# Patient Record
Sex: Male | Born: 1940 | Race: White | Hispanic: No | Marital: Married | Smoking: Former smoker
Health system: Southern US, Community
[De-identification: ages and names within clinical notes are randomized; demographics above are authoritative.]

## PROBLEM LIST (undated history)

## (undated) DIAGNOSIS — Z952 Presence of prosthetic heart valve: Secondary | ICD-10-CM

## (undated) DIAGNOSIS — E785 Hyperlipidemia, unspecified: Secondary | ICD-10-CM

## (undated) DIAGNOSIS — K219 Gastro-esophageal reflux disease without esophagitis: Secondary | ICD-10-CM

## (undated) DIAGNOSIS — Z8719 Personal history of other diseases of the digestive system: Secondary | ICD-10-CM

## (undated) DIAGNOSIS — I480 Paroxysmal atrial fibrillation: Secondary | ICD-10-CM

## (undated) DIAGNOSIS — J449 Chronic obstructive pulmonary disease, unspecified: Secondary | ICD-10-CM

## (undated) DIAGNOSIS — Z951 Presence of aortocoronary bypass graft: Secondary | ICD-10-CM

## (undated) DIAGNOSIS — D649 Anemia, unspecified: Secondary | ICD-10-CM

## (undated) DIAGNOSIS — D472 Monoclonal gammopathy: Secondary | ICD-10-CM

## (undated) DIAGNOSIS — N529 Male erectile dysfunction, unspecified: Secondary | ICD-10-CM

## (undated) DIAGNOSIS — I2510 Atherosclerotic heart disease of native coronary artery without angina pectoris: Secondary | ICD-10-CM

## (undated) DIAGNOSIS — K56609 Unspecified intestinal obstruction, unspecified as to partial versus complete obstruction: Secondary | ICD-10-CM

## (undated) DIAGNOSIS — K5790 Diverticulosis of intestine, part unspecified, without perforation or abscess without bleeding: Secondary | ICD-10-CM

## (undated) DIAGNOSIS — L409 Psoriasis, unspecified: Secondary | ICD-10-CM

## (undated) DIAGNOSIS — N289 Disorder of kidney and ureter, unspecified: Secondary | ICD-10-CM

## (undated) DIAGNOSIS — I10 Essential (primary) hypertension: Secondary | ICD-10-CM

## (undated) HISTORY — DX: Hyperlipidemia, unspecified: E78.5

## (undated) HISTORY — DX: Essential (primary) hypertension: I10

## (undated) HISTORY — DX: Anemia, unspecified: D64.9

## (undated) HISTORY — DX: Atherosclerotic heart disease of native coronary artery without angina pectoris: I25.10

## (undated) HISTORY — PX: VENTRAL HERNIA REPAIR: SHX424

## (undated) HISTORY — PX: ABDOMINAL AORTIC ANEURYSM REPAIR: SUR1152

## (undated) HISTORY — DX: Psoriasis, unspecified: L40.9

## (undated) HISTORY — DX: Disorder of kidney and ureter, unspecified: N28.9

## (undated) HISTORY — DX: Personal history of other diseases of the digestive system: Z87.19

## (undated) HISTORY — DX: Gastro-esophageal reflux disease without esophagitis: K21.9

## (undated) HISTORY — DX: Monoclonal gammopathy: D47.2

## (undated) HISTORY — DX: Presence of aortocoronary bypass graft: Z95.1

## (undated) HISTORY — DX: Unspecified intestinal obstruction, unspecified as to partial versus complete obstruction: K56.609

## (undated) HISTORY — DX: Male erectile dysfunction, unspecified: N52.9

## (undated) HISTORY — DX: Presence of prosthetic heart valve: Z95.2

## (undated) HISTORY — PX: AORTIC VALVE REPLACEMENT: SHX41

## (undated) HISTORY — DX: Diverticulosis of intestine, part unspecified, without perforation or abscess without bleeding: K57.90

## (undated) HISTORY — DX: Chronic obstructive pulmonary disease, unspecified: J44.9

---
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
## 1992-05-09 HISTORY — PX: PARTIAL COLECTOMY: SHX5273

## 2002-05-09 DIAGNOSIS — I2510 Atherosclerotic heart disease of native coronary artery without angina pectoris: Secondary | ICD-10-CM

## 2002-05-09 DIAGNOSIS — Z951 Presence of aortocoronary bypass graft: Secondary | ICD-10-CM

## 2002-05-09 DIAGNOSIS — Z952 Presence of prosthetic heart valve: Secondary | ICD-10-CM

## 2002-05-09 HISTORY — PX: THORACIC AORTIC ANEURYSM REPAIR: SHX799

## 2002-05-09 HISTORY — DX: Presence of aortocoronary bypass graft: Z95.1

## 2002-05-09 HISTORY — DX: Presence of prosthetic heart valve: Z95.2

## 2002-05-09 HISTORY — DX: Atherosclerotic heart disease of native coronary artery without angina pectoris: I25.10

## 2002-12-19 ENCOUNTER — Encounter: Payer: Self-pay | Admitting: Emergency Medicine

## 2002-12-19 ENCOUNTER — Inpatient Hospital Stay (HOSPITAL_COMMUNITY): Admission: EM | Admit: 2002-12-19 | Discharge: 2003-01-04 | Payer: Self-pay

## 2002-12-19 ENCOUNTER — Encounter: Payer: Self-pay | Admitting: *Deleted

## 2002-12-20 HISTORY — PX: CARDIAC CATHETERIZATION: SHX172

## 2002-12-26 ENCOUNTER — Encounter: Payer: Self-pay | Admitting: Surgery

## 2002-12-26 HISTORY — PX: BENTALL PROCEDURE: SHX5058

## 2002-12-26 HISTORY — PX: ASD REPAIR: SHX258

## 2002-12-26 HISTORY — PX: CORONARY ARTERY BYPASS GRAFT: SHX141

## 2002-12-26 HISTORY — PX: AORTIC VALVE REPLACEMENT: SHX41

## 2002-12-27 ENCOUNTER — Encounter: Payer: Self-pay | Admitting: Surgery

## 2002-12-28 ENCOUNTER — Encounter: Payer: Self-pay | Admitting: Surgery

## 2003-01-01 ENCOUNTER — Encounter: Payer: Self-pay | Admitting: Surgery

## 2003-01-02 ENCOUNTER — Encounter: Payer: Self-pay | Admitting: Surgery

## 2003-02-18 ENCOUNTER — Encounter: Payer: Self-pay | Admitting: Surgery

## 2003-02-18 ENCOUNTER — Encounter: Admission: RE | Admit: 2003-02-18 | Discharge: 2003-02-18 | Payer: Self-pay | Admitting: Surgery

## 2011-10-04 HISTORY — PX: NM MYOVIEW LTD: HXRAD82

## 2012-08-24 ENCOUNTER — Encounter: Payer: Self-pay | Admitting: Pharmacist Clinician (PhC)/ Clinical Pharmacy Specialist

## 2012-08-24 DIAGNOSIS — Z952 Presence of prosthetic heart valve: Secondary | ICD-10-CM | POA: Insufficient documentation

## 2012-08-24 DIAGNOSIS — Z7901 Long term (current) use of anticoagulants: Secondary | ICD-10-CM | POA: Insufficient documentation

## 2012-08-24 DIAGNOSIS — D6869 Other thrombophilia: Secondary | ICD-10-CM | POA: Insufficient documentation

## 2012-10-02 LAB — PROTIME-INR: INR: 4 — AB (ref <=1.1)

## 2012-10-05 ENCOUNTER — Ambulatory Visit (INDEPENDENT_AMBULATORY_CARE_PROVIDER_SITE_OTHER): Payer: Self-pay | Admitting: Pharmacist Clinician (PhC)/ Clinical Pharmacy Specialist

## 2012-10-05 DIAGNOSIS — Z954 Presence of other heart-valve replacement: Secondary | ICD-10-CM

## 2012-10-05 DIAGNOSIS — Z952 Presence of prosthetic heart valve: Secondary | ICD-10-CM

## 2012-10-05 DIAGNOSIS — Z7901 Long term (current) use of anticoagulants: Secondary | ICD-10-CM

## 2012-10-09 ENCOUNTER — Encounter: Payer: Self-pay | Admitting: Cardiovascular Disease

## 2012-10-10 ENCOUNTER — Other Ambulatory Visit: Payer: Self-pay | Admitting: Pharmacist Clinician (PhC)/ Clinical Pharmacy Specialist

## 2012-10-10 MED ORDER — WARFARIN SODIUM 5 MG PO TABS: ORAL_TABLET | ORAL | Status: DC

## 2012-10-29 ENCOUNTER — Ambulatory Visit (INDEPENDENT_AMBULATORY_CARE_PROVIDER_SITE_OTHER): Payer: Self-pay | Admitting: Pharmacist Clinician (PhC)/ Clinical Pharmacy Specialist

## 2012-10-29 DIAGNOSIS — Z7901 Long term (current) use of anticoagulants: Secondary | ICD-10-CM

## 2012-10-29 DIAGNOSIS — Z954 Presence of other heart-valve replacement: Secondary | ICD-10-CM

## 2012-10-29 DIAGNOSIS — Z952 Presence of prosthetic heart valve: Secondary | ICD-10-CM

## 2012-10-29 LAB — POCT INR: INR: 5.2

## 2012-11-12 ENCOUNTER — Ambulatory Visit (INDEPENDENT_AMBULATORY_CARE_PROVIDER_SITE_OTHER): Payer: Self-pay | Admitting: Pharmacist Clinician (PhC)/ Clinical Pharmacy Specialist

## 2012-11-12 DIAGNOSIS — Z954 Presence of other heart-valve replacement: Secondary | ICD-10-CM

## 2012-11-12 DIAGNOSIS — Z952 Presence of prosthetic heart valve: Secondary | ICD-10-CM

## 2012-11-12 DIAGNOSIS — Z7901 Long term (current) use of anticoagulants: Secondary | ICD-10-CM

## 2012-11-29 ENCOUNTER — Ambulatory Visit (INDEPENDENT_AMBULATORY_CARE_PROVIDER_SITE_OTHER): Payer: Self-pay | Admitting: Pharmacist Clinician (PhC)/ Clinical Pharmacy Specialist

## 2012-11-29 DIAGNOSIS — Z954 Presence of other heart-valve replacement: Secondary | ICD-10-CM

## 2012-11-29 DIAGNOSIS — Z952 Presence of prosthetic heart valve: Secondary | ICD-10-CM

## 2012-11-29 DIAGNOSIS — Z7901 Long term (current) use of anticoagulants: Secondary | ICD-10-CM

## 2012-12-19 ENCOUNTER — Ambulatory Visit (INDEPENDENT_AMBULATORY_CARE_PROVIDER_SITE_OTHER): Payer: Self-pay | Admitting: Pharmacist Clinician (PhC)/ Clinical Pharmacy Specialist

## 2012-12-19 DIAGNOSIS — Z7901 Long term (current) use of anticoagulants: Secondary | ICD-10-CM

## 2012-12-19 DIAGNOSIS — Z954 Presence of other heart-valve replacement: Secondary | ICD-10-CM

## 2012-12-19 DIAGNOSIS — Z952 Presence of prosthetic heart valve: Secondary | ICD-10-CM

## 2013-01-16 ENCOUNTER — Telehealth: Payer: Self-pay | Admitting: Cardiovascular Disease

## 2013-01-16 NOTE — Telephone Encounter (Signed)
Pt ot have colonoscopy on 10/7, will need lovenox bridge. Sees [facility] on 9/24, will give info to pt at that time.

## 2013-01-16 NOTE — Telephone Encounter (Signed)
Pt is calling regarding his colonoscopy and they want him to go on Lovenox to bridge Warfarin. Needs to know what to do.

## 2013-01-16 NOTE — Telephone Encounter (Signed)
Message forwarded to Belenda Cruise, PharmD.  Medical Records please take paper chart to [facility].  Thanks.

## 2013-01-22 ENCOUNTER — Telehealth (HOSPITAL_COMMUNITY): Payer: Self-pay | Admitting: *Deleted

## 2013-01-22 ENCOUNTER — Other Ambulatory Visit (HOSPITAL_COMMUNITY): Payer: Self-pay | Admitting: Cardiovascular Disease

## 2013-01-22 ENCOUNTER — Ambulatory Visit (INDEPENDENT_AMBULATORY_CARE_PROVIDER_SITE_OTHER): Payer: MEDICARE | Admitting: Pharmacist Clinician (PhC)/ Clinical Pharmacy Specialist

## 2013-01-22 DIAGNOSIS — Z954 Presence of other heart-valve replacement: Secondary | ICD-10-CM

## 2013-01-22 DIAGNOSIS — Z952 Presence of prosthetic heart valve: Secondary | ICD-10-CM

## 2013-01-22 DIAGNOSIS — Z7901 Long term (current) use of anticoagulants: Secondary | ICD-10-CM

## 2013-01-24 ENCOUNTER — Ambulatory Visit (HOSPITAL_COMMUNITY): Payer: MEDICARE

## 2013-01-24 ENCOUNTER — Telehealth: Payer: Self-pay | Admitting: Pharmacist Clinician (PhC)/ Clinical Pharmacy Specialist

## 2013-01-24 NOTE — Telephone Encounter (Signed)
Letter for lovenox bridging for colonoscopy - will give to pt on 9/24 with [facility] appt.

## 2013-01-30 ENCOUNTER — Other Ambulatory Visit: Payer: Self-pay | Admitting: Pharmacist Clinician (PhC)/ Clinical Pharmacy Specialist

## 2013-01-30 MED ORDER — ENOXAPARIN SODIUM 100 MG/ML ~~LOC~~ SOLN: 100.0000 mg | Freq: Two times a day (BID) | SUBCUTANEOUS | Status: DC

## 2013-01-31 ENCOUNTER — Encounter: Payer: Self-pay | Admitting: Cardiovascular Disease

## 2013-02-20 LAB — PROTIME-INR: INR: 2.7 — AB (ref <=1.1)

## 2013-02-21 ENCOUNTER — Ambulatory Visit (INDEPENDENT_AMBULATORY_CARE_PROVIDER_SITE_OTHER): Payer: MEDICARE | Admitting: Pharmacist Clinician (PhC)/ Clinical Pharmacy Specialist

## 2013-02-21 DIAGNOSIS — Z954 Presence of other heart-valve replacement: Secondary | ICD-10-CM

## 2013-02-21 DIAGNOSIS — Z7901 Long term (current) use of anticoagulants: Secondary | ICD-10-CM

## 2013-02-21 DIAGNOSIS — Z952 Presence of prosthetic heart valve: Secondary | ICD-10-CM

## 2013-03-07 ENCOUNTER — Ambulatory Visit (HOSPITAL_COMMUNITY)
Admission: RE | Admit: 2013-03-07 | Discharge: 2013-03-07 | Disposition: A | Discharge status: Home / Self Care | Payer: Medicare Other | Source: Ambulatory Visit | Attending: Cardiovascular Disease | Admitting: Cardiovascular Disease

## 2013-03-07 DIAGNOSIS — Z952 Presence of prosthetic heart valve: Secondary | ICD-10-CM

## 2013-03-07 DIAGNOSIS — Z954 Presence of other heart-valve replacement: Secondary | ICD-10-CM | POA: Insufficient documentation

## 2013-03-07 DIAGNOSIS — I079 Rheumatic tricuspid valve disease, unspecified: Secondary | ICD-10-CM | POA: Insufficient documentation

## 2013-03-07 DIAGNOSIS — I379 Nonrheumatic pulmonary valve disorder, unspecified: Secondary | ICD-10-CM | POA: Insufficient documentation

## 2013-03-07 DIAGNOSIS — I080 Rheumatic disorders of both mitral and aortic valves: Secondary | ICD-10-CM | POA: Insufficient documentation

## 2013-03-07 NOTE — Progress Notes (Signed)
2D Echo Performed 03/07/2013    Estefano Victory, RCS  

## 2013-03-14 ENCOUNTER — Telehealth: Payer: Self-pay | Admitting: *Deleted

## 2013-03-14 NOTE — Telephone Encounter (Signed)
Echo EF normal.  Prosthetic Valve looks good.  Marykay Lex, MD

## 2013-03-14 NOTE — Telephone Encounter (Signed)
Call to pt and results given per MD.  Pt verbalized understanding.  

## 2013-03-14 NOTE — Telephone Encounter (Signed)
Left message to call back  

## 2013-03-14 NOTE — Telephone Encounter (Signed)
Pt is calling in regards to his echo results

## 2013-03-14 NOTE — Telephone Encounter (Signed)
Former Erik Meyer pt w/ recall to see Dr. Herbie Baltimore.  Returned call and pt verified x 2.  Pt informed results will be read by new cardiologist and he will be notified once done.  Pt verbalized understanding and agreed w/ plan.   Medical Records notified to pull chart and place on Dr. Elissa Hefty cart for review when in office.  Result in Epic.  Message forwarded to Dr. Herbie Baltimore.

## 2013-03-15 ENCOUNTER — Other Ambulatory Visit: Payer: Self-pay

## 2013-03-15 MED ORDER — SIMVASTATIN 20 MG PO TABS: 20.0000 mg | ORAL_TABLET | Freq: Every day | ORAL | Status: DC

## 2013-03-15 MED ORDER — AMLODIPINE BESYLATE 10 MG PO TABS: 10.0000 mg | ORAL_TABLET | Freq: Every day | ORAL | Status: DC

## 2013-03-15 MED ORDER — METOPROLOL SUCCINATE ER 50 MG PO TB24: 50.0000 mg | ORAL_TABLET | Freq: Every day | ORAL | Status: DC

## 2013-03-15 NOTE — Telephone Encounter (Signed)
Rx was sent to pharmacy electronically. 

## 2013-03-18 ENCOUNTER — Other Ambulatory Visit: Payer: Self-pay | Admitting: Pharmacist Clinician (PhC)/ Clinical Pharmacy Specialist

## 2013-03-18 MED ORDER — WARFARIN SODIUM 5 MG PO TABS: ORAL_TABLET | ORAL | Status: DC

## 2013-03-20 ENCOUNTER — Ambulatory Visit (INDEPENDENT_AMBULATORY_CARE_PROVIDER_SITE_OTHER): Payer: MEDICARE | Admitting: Pharmacist Clinician (PhC)/ Clinical Pharmacy Specialist

## 2013-03-20 DIAGNOSIS — Z954 Presence of other heart-valve replacement: Secondary | ICD-10-CM

## 2013-03-20 DIAGNOSIS — Z7901 Long term (current) use of anticoagulants: Secondary | ICD-10-CM

## 2013-03-20 DIAGNOSIS — Z952 Presence of prosthetic heart valve: Secondary | ICD-10-CM

## 2013-04-18 ENCOUNTER — Ambulatory Visit (INDEPENDENT_AMBULATORY_CARE_PROVIDER_SITE_OTHER): Payer: MEDICARE | Admitting: Pharmacist Clinician (PhC)/ Clinical Pharmacy Specialist

## 2013-04-18 DIAGNOSIS — Z7901 Long term (current) use of anticoagulants: Secondary | ICD-10-CM

## 2013-04-18 DIAGNOSIS — Z952 Presence of prosthetic heart valve: Secondary | ICD-10-CM

## 2013-04-18 DIAGNOSIS — Z954 Presence of other heart-valve replacement: Secondary | ICD-10-CM

## 2013-05-15 LAB — PROTIME-INR: INR: 4.7 — AB (ref <=1.1)

## 2013-05-16 ENCOUNTER — Ambulatory Visit (INDEPENDENT_AMBULATORY_CARE_PROVIDER_SITE_OTHER): Payer: MEDICARE | Admitting: Pharmacist Clinician (PhC)/ Clinical Pharmacy Specialist

## 2013-05-16 DIAGNOSIS — Z7901 Long term (current) use of anticoagulants: Secondary | ICD-10-CM

## 2013-05-16 DIAGNOSIS — Z954 Presence of other heart-valve replacement: Secondary | ICD-10-CM

## 2013-05-16 DIAGNOSIS — Z952 Presence of prosthetic heart valve: Secondary | ICD-10-CM

## 2013-05-28 ENCOUNTER — Telehealth: Payer: Self-pay | Admitting: *Deleted

## 2013-05-28 MED ORDER — VALSARTAN-HYDROCHLOROTHIAZIDE 320-12.5 MG PO TABS: 1.0000 | ORAL_TABLET | Freq: Every day | ORAL | Status: DC

## 2013-05-28 MED ORDER — AMLODIPINE BESYLATE 10 MG PO TABS: 10.0000 mg | ORAL_TABLET | Freq: Every day | ORAL | Status: DC

## 2013-05-28 MED ORDER — SIMVASTATIN 20 MG PO TABS: 20.0000 mg | ORAL_TABLET | Freq: Every day | ORAL | Status: DC

## 2013-05-28 MED ORDER — METOPROLOL SUCCINATE ER 50 MG PO TB24: 50.0000 mg | ORAL_TABLET | Freq: Every day | ORAL | Status: DC

## 2013-05-28 NOTE — Telephone Encounter (Signed)
Please find out which Rx pt needs so it can be sent to [facility].  Thanks. ~ [employer]. Nicki Reaper, BSN, RN

## 2013-05-28 NOTE — Telephone Encounter (Signed)
Pt stated that he needed all of them called in except Lovenox with the addition of Valsartan 320/12.5 mg 1 a day.

## 2013-05-28 NOTE — Telephone Encounter (Signed)
Paper chart received and Rxs verified.  Refill(s) sent to pharmacy: [facility] for 90 day supplies w/o refills.  Keep appt for refills.

## 2013-05-28 NOTE — Telephone Encounter (Signed)
Pt called stating that he has new insurance and needs his Rx to be refilled at [facility]  [facility]

## 2013-05-29 ENCOUNTER — Telehealth: Payer: Self-pay | Admitting: *Deleted

## 2013-05-29 LAB — PROTIME-INR: INR: 4.1 — AB (ref <=1.1)

## 2013-05-29 NOTE — Telephone Encounter (Signed)
Pt called stating that all the refills were called into [facility] except for his Coumadin, and wanted to know if it can be called in.  [place]

## 2013-05-30 ENCOUNTER — Ambulatory Visit (INDEPENDENT_AMBULATORY_CARE_PROVIDER_SITE_OTHER): Payer: MEDICARE | Admitting: Pharmacist Clinician (PhC)/ Clinical Pharmacy Specialist

## 2013-05-30 DIAGNOSIS — Z954 Presence of other heart-valve replacement: Secondary | ICD-10-CM

## 2013-05-30 DIAGNOSIS — Z7901 Long term (current) use of anticoagulants: Secondary | ICD-10-CM

## 2013-05-30 DIAGNOSIS — Z952 Presence of prosthetic heart valve: Secondary | ICD-10-CM

## 2013-05-30 MED ORDER — WARFARIN SODIUM 5 MG PO TABS: ORAL_TABLET | ORAL | Status: DC

## 2013-06-17 LAB — PROTIME-INR: INR: 2.6 — AB (ref <=1.1)

## 2013-06-18 ENCOUNTER — Ambulatory Visit (INDEPENDENT_AMBULATORY_CARE_PROVIDER_SITE_OTHER): Payer: MEDICARE | Admitting: Pharmacist Clinician (PhC)/ Clinical Pharmacy Specialist

## 2013-06-18 DIAGNOSIS — Z7901 Long term (current) use of anticoagulants: Secondary | ICD-10-CM

## 2013-06-18 DIAGNOSIS — Z954 Presence of other heart-valve replacement: Secondary | ICD-10-CM

## 2013-06-18 DIAGNOSIS — Z952 Presence of prosthetic heart valve: Secondary | ICD-10-CM

## 2013-07-07 DIAGNOSIS — K56609 Unspecified intestinal obstruction, unspecified as to partial versus complete obstruction: Secondary | ICD-10-CM

## 2013-07-07 HISTORY — DX: Unspecified intestinal obstruction, unspecified as to partial versus complete obstruction: K56.609

## 2013-07-07 HISTORY — PX: LAPAROSCOPIC LYSIS OF ADHESIONS: SHX5905

## 2013-07-12 LAB — PROTIME-INR: INR: 4.8 — AB (ref 0.9–1.1)

## 2013-07-15 ENCOUNTER — Ambulatory Visit (INDEPENDENT_AMBULATORY_CARE_PROVIDER_SITE_OTHER): Payer: MEDICARE | Admitting: Pharmacist Clinician (PhC)/ Clinical Pharmacy Specialist

## 2013-07-15 DIAGNOSIS — Z954 Presence of other heart-valve replacement: Secondary | ICD-10-CM

## 2013-07-15 DIAGNOSIS — Z952 Presence of prosthetic heart valve: Secondary | ICD-10-CM

## 2013-07-15 DIAGNOSIS — Z7901 Long term (current) use of anticoagulants: Secondary | ICD-10-CM

## 2013-07-24 LAB — PROTIME-INR
INR: 2.4 — AB (ref 0.9–1.1)
INR: 2.4 — AB (ref <=1.1)

## 2013-07-29 ENCOUNTER — Telehealth: Payer: Self-pay | Admitting: Pharmacist Clinician (PhC)/ Clinical Pharmacy Specialist

## 2013-07-29 ENCOUNTER — Ambulatory Visit (INDEPENDENT_AMBULATORY_CARE_PROVIDER_SITE_OTHER): Payer: MEDICARE | Admitting: Pharmacist Clinician (PhC)/ Clinical Pharmacy Specialist

## 2013-07-29 DIAGNOSIS — Z952 Presence of prosthetic heart valve: Secondary | ICD-10-CM

## 2013-07-29 DIAGNOSIS — Z954 Presence of other heart-valve replacement: Secondary | ICD-10-CM

## 2013-07-29 DIAGNOSIS — Z7901 Long term (current) use of anticoagulants: Secondary | ICD-10-CM

## 2013-07-29 NOTE — Telephone Encounter (Signed)
Pt called from [facility] ER, states has bowel obstruction, needs surgery, will call later.

## 2013-08-01 ENCOUNTER — Ambulatory Visit: Payer: MEDICARE | Admitting: Cardiology

## 2013-08-06 ENCOUNTER — Telehealth: Payer: Self-pay | Admitting: Pharmacist Clinician (PhC)/ Clinical Pharmacy Specialist

## 2013-08-06 NOTE — Telephone Encounter (Signed)
Returned call to patient, has been in [facility] x 8-9 days, waiting for INR to become therapeutic for discharge.  Advised that he return to previously stable dose of 10mg  daily except 7.5mg  MF and check INR 1 week after discharge.  Pt voiced understanding

## 2013-08-06 NOTE — Telephone Encounter (Signed)
Pt in hospital in [place], had problems with bowel obstruction, now waiting for INR to become therapeutic for discharge.  Wants to know what dose schedule should be on d/c.

## 2013-08-13 LAB — POCT INR: INR: 2.2

## 2013-08-14 ENCOUNTER — Other Ambulatory Visit: Payer: Self-pay

## 2013-08-14 ENCOUNTER — Ambulatory Visit (INDEPENDENT_AMBULATORY_CARE_PROVIDER_SITE_OTHER): Payer: MEDICARE | Admitting: Pharmacist Clinician (PhC)/ Clinical Pharmacy Specialist

## 2013-08-14 DIAGNOSIS — Z7901 Long term (current) use of anticoagulants: Secondary | ICD-10-CM

## 2013-08-14 DIAGNOSIS — Z952 Presence of prosthetic heart valve: Secondary | ICD-10-CM

## 2013-08-14 DIAGNOSIS — Z954 Presence of other heart-valve replacement: Secondary | ICD-10-CM

## 2013-08-14 MED ORDER — SIMVASTATIN 20 MG PO TABS: 20.0000 mg | ORAL_TABLET | Freq: Every day | ORAL | Status: DC

## 2013-08-14 MED ORDER — VALSARTAN-HYDROCHLOROTHIAZIDE 320-12.5 MG PO TABS: 1.0000 | ORAL_TABLET | Freq: Every day | ORAL | Status: DC

## 2013-08-14 MED ORDER — METOPROLOL SUCCINATE ER 50 MG PO TB24: 50.0000 mg | ORAL_TABLET | Freq: Every day | ORAL | Status: DC

## 2013-08-14 MED ORDER — AMLODIPINE BESYLATE 10 MG PO TABS: 10.0000 mg | ORAL_TABLET | Freq: Every day | ORAL | Status: DC

## 2013-08-14 NOTE — Telephone Encounter (Signed)
Rx was sent to pharmacy electronically. 

## 2013-08-20 DIAGNOSIS — N289 Disorder of kidney and ureter, unspecified: Secondary | ICD-10-CM

## 2013-08-20 HISTORY — DX: Disorder of kidney and ureter, unspecified: N28.9

## 2013-08-23 LAB — PROTIME-INR: INR: 1.4 — AB (ref 0.9–1.1)

## 2013-08-27 ENCOUNTER — Ambulatory Visit (INDEPENDENT_AMBULATORY_CARE_PROVIDER_SITE_OTHER): Payer: MEDICARE | Admitting: Pharmacist Clinician (PhC)/ Clinical Pharmacy Specialist

## 2013-08-27 DIAGNOSIS — Z954 Presence of other heart-valve replacement: Secondary | ICD-10-CM

## 2013-08-27 DIAGNOSIS — Z952 Presence of prosthetic heart valve: Secondary | ICD-10-CM

## 2013-08-27 DIAGNOSIS — Z7901 Long term (current) use of anticoagulants: Secondary | ICD-10-CM

## 2013-09-04 LAB — PROTIME-INR: INR: 3 — AB (ref 0.9–1.1)

## 2013-09-06 ENCOUNTER — Ambulatory Visit (INDEPENDENT_AMBULATORY_CARE_PROVIDER_SITE_OTHER): Payer: MEDICARE | Admitting: Pharmacist Clinician (PhC)/ Clinical Pharmacy Specialist

## 2013-09-06 DIAGNOSIS — Z952 Presence of prosthetic heart valve: Secondary | ICD-10-CM

## 2013-09-06 DIAGNOSIS — Z954 Presence of other heart-valve replacement: Secondary | ICD-10-CM

## 2013-09-06 DIAGNOSIS — Z7901 Long term (current) use of anticoagulants: Secondary | ICD-10-CM

## 2013-09-11 ENCOUNTER — Encounter: Payer: Self-pay | Admitting: *Deleted

## 2013-09-13 ENCOUNTER — Encounter: Payer: Self-pay | Admitting: Cardiology

## 2013-09-13 ENCOUNTER — Ambulatory Visit (INDEPENDENT_AMBULATORY_CARE_PROVIDER_SITE_OTHER): Payer: Medicare HMO | Admitting: Cardiology

## 2013-09-13 VITALS — BP 148/72 | HR 57 | Ht 71.0 in | Wt 213.6 lb

## 2013-09-13 DIAGNOSIS — Z952 Presence of prosthetic heart valve: Secondary | ICD-10-CM

## 2013-09-13 DIAGNOSIS — E785 Hyperlipidemia, unspecified: Secondary | ICD-10-CM

## 2013-09-13 DIAGNOSIS — Z7901 Long term (current) use of anticoagulants: Secondary | ICD-10-CM

## 2013-09-13 DIAGNOSIS — I2510 Atherosclerotic heart disease of native coronary artery without angina pectoris: Secondary | ICD-10-CM

## 2013-09-13 DIAGNOSIS — I10 Essential (primary) hypertension: Secondary | ICD-10-CM

## 2013-09-13 DIAGNOSIS — Z954 Presence of other heart-valve replacement: Secondary | ICD-10-CM

## 2013-09-13 NOTE — Patient Instructions (Signed)
No changes continue with current medication   Your physician wants you to follow-up in 6 month Dr Ellyn Hack.  You will receive a reminder letter in the mail two months in advance. If you don't receive a letter, please call our office to schedule the follow-up appointment.

## 2013-09-15 ENCOUNTER — Encounter: Payer: Self-pay | Admitting: Cardiology

## 2013-09-15 DIAGNOSIS — I25709 Atherosclerosis of coronary artery bypass graft(s), unspecified, with unspecified angina pectoris: Secondary | ICD-10-CM | POA: Insufficient documentation

## 2013-09-15 DIAGNOSIS — E785 Hyperlipidemia, unspecified: Secondary | ICD-10-CM | POA: Insufficient documentation

## 2013-09-15 DIAGNOSIS — Z952 Presence of prosthetic heart valve: Secondary | ICD-10-CM | POA: Insufficient documentation

## 2013-09-15 DIAGNOSIS — I10 Essential (primary) hypertension: Secondary | ICD-10-CM | POA: Insufficient documentation

## 2013-09-15 NOTE — Assessment & Plan Note (Signed)
The discomfort is having is not exertional. It is intermittent and really not associated with anything in particular. It does not sound like an anginal symptom. His last Myoview was in 2013, if his symptoms continue to be bothersome, would consider rechecking a Cardiolite/Myoview to assess for ischemia. He is on aspirin, beta blocker, ARB as well as calcium channel blocker.

## 2013-09-15 NOTE — Assessment & Plan Note (Addendum)
He seems to be relatively stable overall. Most recent echocardiogram showed stable low functioning valve. Would anticipate probably rechecking echocardiogram not this year but next year unless symptoms warrant. Continues to be on warfarin.

## 2013-09-15 NOTE — Assessment & Plan Note (Signed)
On statin. Monitored by PCP. Usually labs were checked with his annual exam.

## 2013-09-15 NOTE — Assessment & Plan Note (Signed)
Borderline control on significant medications. Do not have room to increase metoprolol, or Diovan for that matter. The only difference could be increasing the HCTZ component of the Diovan-HCT.Marland Kitchen interest not add a new medication. We'll just continue to monitor. His blood pressures usually better than this at home.

## 2013-09-15 NOTE — Progress Notes (Signed)
PATIENT: Erik Meyer MRN: [id number] DOB: February 13, 1941 PCP: Olin Hauser, MD  Clinic Note: Chief Complaint  Patient presents with  . 6 month visit    chest pain - dull , hosp at the end of [facility]ARCH- SMALL BOWEL OBSTRUCTION -SURGERY, NO SOB, NO EDEMA    HPI: Erik Meyer is a 73 y.o. male with a PMH below who presents today for establishment of cardiology care to return to Dr. Rollene Fare. 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 Cyndia Bent in 2004.  Preop cath showed multivessel disease so he also had four-vessel CABG. Since his surgery, he is not having further catheterizations. Myoview 09/2011 low risk of no ischemia. Most recent echo was in October 2014 which showed normal functioning prosthetic valve. He has had a constant dull pain in his chest since before his surgery.  Since his last visit with Dr. Rollene Fare and 2014, he was admitted to the hospital in [place] for small bowel obstruction. Unlike his prior small bowel shows an admission in December 2013, he had to undergo abdominal surgery with lysis of a single adhesion that resolved his bowel obstruction. An abdominal exam revealed a right kidney lesion that is concerning for either R. cc versus hemorrhagic cyst. He was referred to Dr. Eden Lathe for urology. Thought was this was not likely to be cancer. Followup CT scan in July.   Interval History: Today's using Nu-Knit well and, in his usual state of health. He has no major complaints. He continues to have this intermittent substernal chest discomfort that is all pressure like. Is not exertional in the kidney off and on for a while and go away for a while. He does not have routine exertional chest discomfort or dyspnea.  Cardiovascular ROS: No PND, orthopnea or edema.  No palpitations, lightheadedness, dizziness, weakness or syncope/near syncope. No TIA/amaurosis fugax  symptoms. No melena, hematochezia, epistaxis or hematuria.  No claudication.   Past Medical History  Diagnosis Date  . Hx of irritable bowel syndrome   . CAD, multiple vessel 2004    Found during preop cath for [facility] AVR procedure;   Marland Kitchen S/P CABG x 4 2004    LIMA-LAD, SVG-D1, SVG- PDA-PLA  . S/P AVR (aortic valve replacement) and aortoplasty 2004    Dr. Cyndia BentDeneen Harts procedure - St. Jude aVR (25 mm prosthesis) with aortic root conduit  . Monoclonal gammopathy of undetermined significance   . History of SBO (small bowel obstruction) March 2015    Abdominal surgery with LOA  . Lesion of right native kidney  08/20/2013    Abdominal US : 2.4 cm x 1.7 complex lesion in the upper pole the right kidney in March of 2012 concerning for RCC although enlarging hemorrhagic cyst could have this appearance as well.,   . Dyslipidemia, goal LDL below 70     Monitored by PCP. On statin  . Hypertension, essential   . Anemia     Prior Cardiac Evaluation and Past Surgical History: Past Surgical History  Procedure Laterality Date  . Abdominal aortic aneurysm repair    . Asd repair  12/26/2002    Aortic valve replacement and replacement of aortic root aneurysm using a 25 mm St. JUDE mechanical valve cinduit with reimplantation of the coronary arteeries  . Coronary artery bypass graft  12/26/2002    LIMA to LAD,SVG to diagonal, SVG to PDA and PLA.   Marland Kitchen Ventral hernia  repair      repair with mesh by Dr Collene Mares at [facility]  . Nm myoview ltd  10/04/2011    showed no ischemia;EF52%  . Cardiac catheterization  Aug  13,2004    diffuse  coronary disease,prior to his BENTALL PROCEDURE  . Transthoracic echocardiogram  October 2013    CW doppler gradient of 2.67m/ second across the aortic valve with a mean  gradient of 12 ,which was normal for valve, normal RSVP ANDSYSTOLIC FUNCTION WIYH DIASTOLIC RELAXATION ABNORMALITY  . Transthoracic echocardiogram  October 2014    EF 55-60%. Mild concentric LVH;  Grade 1  Diastolic Dysfunction.; St. Jude familiar mechanical prosthesis well seated. No evidence of stenosis. Gradients normal for  prosthesis; moderate to severely dilated left atrium.   . Partial colectomy  1994  . Laparoscopic lysis of adhesions  March 2015    performed in [place] for SBO     Allergies  Allergen Reactions  . Doxycycline Rash    Current Outpatient Prescriptions  Medication Sig Dispense Refill  . amLODipine (NORVASC) 10 MG tablet Take 1 tablet (10 mg total) by mouth daily. Keep appointment for refills.  90 tablet  1  . ampicillin (PRINCIPEN) 500 MG capsule Take 500 mg by mouth daily.       Marland Kitchen aspirin 81 MG tablet Take 81 mg by mouth daily.      Marland Kitchen esomeprazole (NEXIUM) 20 MG capsule Take 20 mg by mouth daily at 12 noon.      . gabapentin (NEURONTIN) 300 MG capsule TAKE 2 TABLET AT BEDTIME      . IRON PO Take 325 mg by mouth daily.       . metoprolol succinate (TOPROL-XL) 50 MG 24 hr tablet Take 1 tablet (50 mg total) by mouth daily. Take with or immediately following a meal. Keep appointment for refills.  90 tablet  1  . simvastatin (ZOCOR) 20 MG tablet Take 1 tablet (20 mg total) by mouth at bedtime. Keep appointment for refills.  90 tablet  1  . tamsulosin (FLOMAX) 0.4 MG CAPS capsule Take 0.4 mg by mouth daily.      . valsartan-hydrochlorothiazide (DIOVAN-HCT) 320-12.5 MG per tablet Take 1 tablet by mouth daily. Keep appointment for refills.  90 tablet  1  . warfarin (COUMADIN) 5 MG tablet Take 2 tablets by mouth daily or as directed by coumadin clinic  180 tablet  1   No current facility-administered medications for this visit.    History   Social History Narrative   Married gentleman with no children. Quit smoking in 1998.   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   PCP: Dr. Claiborne Billings in [place]; GI: Dr. Janus Molder in [place]    Family History: Cancer in his mother; Lung cancer in his father.  ROS: A comprehensive Review of Systems - Negative except Result ligament Erik Meyer discomfort from his recent procedure. He has chronic mild right-sided lower extremity edema that is intermittent. Was negative with exertion of mild arthritis pains.  PHYSICAL EXAM BP 148/72  Pulse 57  Ht 5\' 11"  (1.803 m)  Wt 213 lb 9.6 oz (96.888 kg)  BMI 29.80 kg/m2 General appearance: alert, cooperative, appears stated age, no distress and Healthy-appearing, well-nourished well-groomed. He answers questions appropriately. Neck: no carotid bruit, no JVD, supple, symmetrical, trachea midline and radiating aortic murmur Lungs: clear to auscultation  bilaterally, normal percussion bilaterally and Nonlabored, good movement Heart: RRR with normal S1 with mild mechanical S2. Slightly lateral PMI with this soft S4. 2/6 musical SEM LUSB radiating to the carotids as well as base. No carotid delay.; No other M./R./G.  Abdomen: soft, non-tender; bowel sounds normal; no masses,  no organomegaly; well-healing surgical scars. A Extremities: Trace edema on the right lower extremity with minimal left'; mild varicosities Pulses: 2+ and symmetric Neurologic: Alert and oriented X 3, normal strength and tone. Normal symmetric reflexes. Normal coordination and gait   Adult ECG Report  Rate: 57 ;  Rhythm: sinus bradycardia  QRS Axis: 33 ;  PR Interval: 202 ;  QRS Duration: 90 ; QTc: 418;  Voltages: Normal   Conduction Disturbances: first-degree A-V block  - borderline   Other Abnormalities: none  Narrative Interpretation: Mostly normal EKG with sinus bradycardia in borderline 1 AV block   Recent Labs: From [facility] CMP:   Sodium 134, potassium 0, chloride 98, bicarbonate 26, glucose 11, BUN/creatinine 9/0.7. Calcium 9.1. ; Total bili 0.6. Alkaline phosphatase 69, AST/ALT 18 /17, total protein 6.3, albumin 4.1. ;; Lipase 47     CBC: W2 0.4,  H./H. 12.6/36.6, Plt 149   ASSESSMENT / PLAN:  Overall relatively stable gentleman with no active complaints 11 years post BENTAL-AVR/CABGx4. Valve appears to be functioning well by echo this past October.    S/P AVR (aortic valve replacement) and aortoplasty He seems to be relatively stable overall. Most recent echocardiogram showed stable low functioning valve. Would anticipate probably rechecking echocardiogram not this year but next year unless symptoms warrant. Continues to be on warfarin.  CAD, multiple vessel  The discomfort is having is not exertional. It is intermittent and really not associated with anything in particular. It does not sound like an anginal symptom. His last Myoview was in 2013, if his symptoms continue to be bothersome, would consider rechecking a Cardiolite/Myoview to assess for ischemia. He is on aspirin, beta blocker, ARB as well as calcium channel blocker.  Hypertension, essential Borderline control on significant medications. Do not have room to increase metoprolol, or Diovan for that matter. The only difference could be increasing the HCTZ component of the Diovan-HCT.Marland Kitchen interest not add a new medication. We'll just continue to monitor. His blood pressures usually better than this at home.  Long term (current) use of anticoagulants Warfarin followed here at [facility]  Dyslipidemia, goal LDL below 70 On statin. Monitored by PCP. Usually labs were checked with his annual exam.   Orders Placed This Encounter  Procedures  . EKG 12-Lead    Followup: Six-month    Kinsler Soeder W. Ellyn Hack, M.D., M.S. Interventional Cardiolgy

## 2013-09-15 NOTE — Assessment & Plan Note (Signed)
Warfarin followed here at [facility]

## 2013-09-16 ENCOUNTER — Ambulatory Visit: Payer: MEDICARE | Admitting: Cardiology

## 2013-09-25 LAB — POCT INR: INR: 3.7

## 2013-09-27 ENCOUNTER — Ambulatory Visit (INDEPENDENT_AMBULATORY_CARE_PROVIDER_SITE_OTHER): Payer: Medicare HMO | Admitting: Pharmacist Clinician (PhC)/ Clinical Pharmacy Specialist

## 2013-09-27 DIAGNOSIS — Z954 Presence of other heart-valve replacement: Secondary | ICD-10-CM

## 2013-09-27 DIAGNOSIS — Z7901 Long term (current) use of anticoagulants: Secondary | ICD-10-CM

## 2013-09-27 DIAGNOSIS — Z952 Presence of prosthetic heart valve: Secondary | ICD-10-CM

## 2013-10-16 LAB — PROTIME-INR

## 2013-10-17 LAB — POCT INR: INR: 3.6

## 2013-10-22 ENCOUNTER — Ambulatory Visit (INDEPENDENT_AMBULATORY_CARE_PROVIDER_SITE_OTHER): Payer: Medicare HMO | Admitting: Pharmacist Clinician (PhC)/ Clinical Pharmacy Specialist

## 2013-10-22 DIAGNOSIS — Z7901 Long term (current) use of anticoagulants: Secondary | ICD-10-CM

## 2013-10-22 DIAGNOSIS — Z954 Presence of other heart-valve replacement: Secondary | ICD-10-CM

## 2013-10-22 DIAGNOSIS — Z952 Presence of prosthetic heart valve: Secondary | ICD-10-CM

## 2013-11-06 ENCOUNTER — Other Ambulatory Visit: Payer: Self-pay | Admitting: Pharmacist Clinician (PhC)/ Clinical Pharmacy Specialist

## 2013-11-11 LAB — PROTIME-INR: INR: 3.2 — AB (ref 0.9–1.1)

## 2013-11-13 ENCOUNTER — Ambulatory Visit (INDEPENDENT_AMBULATORY_CARE_PROVIDER_SITE_OTHER): Payer: Medicare HMO | Admitting: Pharmacist

## 2013-11-13 DIAGNOSIS — Z954 Presence of other heart-valve replacement: Secondary | ICD-10-CM

## 2013-11-13 DIAGNOSIS — Z952 Presence of prosthetic heart valve: Secondary | ICD-10-CM

## 2013-11-13 DIAGNOSIS — Z7901 Long term (current) use of anticoagulants: Secondary | ICD-10-CM

## 2013-12-10 ENCOUNTER — Other Ambulatory Visit: Payer: Self-pay

## 2013-12-10 LAB — PROTIME-INR: INR: 3.2 — AB (ref <=1.1)

## 2013-12-11 LAB — PROTIME-INR
INR: 3.2 — AB (ref 0.8–1.2)
PROTHROMBIN TIME: 33.9 s — AB (ref 9.1–12.0)

## 2013-12-12 ENCOUNTER — Other Ambulatory Visit: Payer: Self-pay | Admitting: Cardiology

## 2013-12-12 ENCOUNTER — Ambulatory Visit (INDEPENDENT_AMBULATORY_CARE_PROVIDER_SITE_OTHER): Payer: Medicare HMO | Admitting: Pharmacist Clinician (PhC)/ Clinical Pharmacy Specialist

## 2013-12-12 DIAGNOSIS — Z7901 Long term (current) use of anticoagulants: Secondary | ICD-10-CM

## 2013-12-12 DIAGNOSIS — Z952 Presence of prosthetic heart valve: Secondary | ICD-10-CM

## 2013-12-12 DIAGNOSIS — Z954 Presence of other heart-valve replacement: Secondary | ICD-10-CM

## 2013-12-12 NOTE — Telephone Encounter (Signed)
Rx was sent to pharmacy electronically. 

## 2014-01-07 ENCOUNTER — Other Ambulatory Visit: Payer: Self-pay

## 2014-01-07 LAB — POCT INR: INR: 3.1

## 2014-01-08 LAB — PROTIME-INR
INR: 3.1 — ABNORMAL HIGH (ref 0.8–1.2)
PROTHROMBIN TIME: 32.5 s — AB (ref 9.1–12.0)

## 2014-01-10 ENCOUNTER — Ambulatory Visit (INDEPENDENT_AMBULATORY_CARE_PROVIDER_SITE_OTHER): Payer: Medicare HMO | Admitting: Pharmacist Clinician (PhC)/ Clinical Pharmacy Specialist

## 2014-01-10 DIAGNOSIS — Z952 Presence of prosthetic heart valve: Secondary | ICD-10-CM

## 2014-01-10 DIAGNOSIS — Z7901 Long term (current) use of anticoagulants: Secondary | ICD-10-CM

## 2014-01-10 DIAGNOSIS — Z954 Presence of other heart-valve replacement: Secondary | ICD-10-CM

## 2014-02-11 ENCOUNTER — Other Ambulatory Visit: Payer: Self-pay

## 2014-02-11 ENCOUNTER — Telehealth: Payer: Self-pay | Admitting: Cardiology

## 2014-02-11 NOTE — Telephone Encounter (Signed)
Erik Meyer ICD 10 CODE FOR PATIENT'S PROTIME  ICD -10 CODE  ---Z79.01 GIVEN TO STEPHAINE

## 2014-02-12 LAB — PROTIME-INR
INR: 3.2 — ABNORMAL HIGH (ref 0.8–1.2)
Prothrombin Time: 33.7 s — ABNORMAL HIGH (ref 9.1–12.0)

## 2014-02-13 ENCOUNTER — Ambulatory Visit (INDEPENDENT_AMBULATORY_CARE_PROVIDER_SITE_OTHER): Payer: Medicare HMO | Admitting: Pharmacist Clinician (PhC)/ Clinical Pharmacy Specialist

## 2014-02-13 DIAGNOSIS — Z954 Presence of other heart-valve replacement: Secondary | ICD-10-CM

## 2014-02-13 DIAGNOSIS — Z952 Presence of prosthetic heart valve: Secondary | ICD-10-CM

## 2014-03-13 ENCOUNTER — Encounter: Payer: Self-pay | Admitting: Cardiology

## 2014-03-13 ENCOUNTER — Ambulatory Visit (INDEPENDENT_AMBULATORY_CARE_PROVIDER_SITE_OTHER): Payer: Medicare HMO | Admitting: Cardiology

## 2014-03-13 VITALS — BP 148/66 | HR 62 | Ht 71.0 in | Wt 220.1 lb

## 2014-03-13 DIAGNOSIS — E785 Hyperlipidemia, unspecified: Secondary | ICD-10-CM

## 2014-03-13 DIAGNOSIS — Z952 Presence of prosthetic heart valve: Secondary | ICD-10-CM

## 2014-03-13 DIAGNOSIS — Z79899 Other long term (current) drug therapy: Secondary | ICD-10-CM

## 2014-03-13 DIAGNOSIS — I10 Essential (primary) hypertension: Secondary | ICD-10-CM

## 2014-03-13 DIAGNOSIS — E669 Obesity, unspecified: Secondary | ICD-10-CM

## 2014-03-13 DIAGNOSIS — I2510 Atherosclerotic heart disease of native coronary artery without angina pectoris: Secondary | ICD-10-CM

## 2014-03-13 DIAGNOSIS — Z951 Presence of aortocoronary bypass graft: Secondary | ICD-10-CM

## 2014-03-13 DIAGNOSIS — Z954 Presence of other heart-valve replacement: Secondary | ICD-10-CM

## 2014-03-13 NOTE — Patient Instructions (Addendum)
No change to medications   Your physician wants you to follow-up in 6 months  Dr Ellyn Hack. You will receive a reminder letter in the mail two months in advance. If you don't receive a letter, please call our office to schedule the follow-up appointment.

## 2014-03-15 ENCOUNTER — Encounter: Payer: Self-pay | Admitting: Cardiology

## 2014-03-15 DIAGNOSIS — Z951 Presence of aortocoronary bypass graft: Secondary | ICD-10-CM | POA: Insufficient documentation

## 2014-03-15 DIAGNOSIS — E669 Obesity, unspecified: Secondary | ICD-10-CM | POA: Insufficient documentation

## 2014-03-15 NOTE — Progress Notes (Signed)
PCP: Olin Hauser, MD  Clinic Note: Chief Complaint  Patient presents with  . Follow-up    6 months; no CP, SOB, Pressure, Tightness, no swelling out of the norm    HPI: Erik Meyer is a 73 y.o. male with a PMH below who presents today for 62-month follow-up.  Past Medical History  Diagnosis Date  . Hx of irritable bowel syndrome   . CAD, multiple vessel 2004    Found during preop cath for [facility] AVR procedure; Myoview 5/'13: No ischemia or infarct, EF 53%  . S/P CABG x 4 2004    LIMA-LAD, SVG-D1, SVG- PDA-PLA (along with AVR)  . S/P AVR (aortic valve replacement) and aortoplasty 2004    Dr. Cyndia BentDeneen Harts procedure - St. Jude aVR (25 mm prosthesis) with aortic root conduit;; Echo 10/'14: EF 55-60%, Mod Conc LVH, Gr 1 DD, Well Seated AoV Mech Prosthesis with normal P gradients (no stenosis), Mod-Severe LA dilation  . Monoclonal gammopathy of undetermined significance   . History of SBO (small bowel obstruction) March 2015    Abdominal surgery with LOA  . Lesion of right native kidney  08/20/2013    Abdominal US : 2.4 cm x 1.7 complex lesion in the upper pole the right kidney in March of 2012 concerning for RCC although enlarging hemorrhagic cyst could have this appearance as well.,   . Dyslipidemia, goal LDL below 70     Monitored by PCP. On statin  . Hypertension, essential   . Anemia    Interval History:  He really has no major complaints. He still works out at [employer] doing [employer] 340 week. He used to do walking for exercise but his arthritis is really been bothering him. When he exercises, and denies any chest pressure with rest or exertion. No exertional dyspnea. He does try to walk up a hill or steps she will get dyspneic. This is not new for him. He denies any symptoms of heart failure such as PND, orthopnea -- but does have occasional RLE edema.  He is not having problems with palpitations, lightheadedness, dizziness, weakness, syncope/near syncope, or  TIA/amaurosis fugax symptoms.  ROS: A comprehensive was performed. Review of Systems  Constitutional: Negative for malaise/fatigue.  HENT: Negative for nosebleeds.   Respiratory: Negative for cough, shortness of breath and wheezing.   Cardiovascular: Positive for leg swelling. Negative for claudication.  Gastrointestinal: Negative for constipation, blood in stool and melena.  Genitourinary: Negative for hematuria.  Musculoskeletal: Positive for joint pain.  Endo/Heme/Allergies: Does not bruise/bleed easily.  Psychiatric/Behavioral: Negative for depression. The patient is not nervous/anxious.   All other systems reviewed and are negative.   Current Outpatient Prescriptions on File Prior to Visit  Medication Sig Dispense Refill  . amLODipine (NORVASC) 10 MG tablet Take 1 tablet (10 mg total) by mouth daily. 90 tablet 2  . ampicillin (PRINCIPEN) 500 MG capsule Take 500 mg by mouth daily.     Marland Kitchen aspirin 81 MG tablet Take 81 mg by mouth daily.    Marland Kitchen esomeprazole (NEXIUM) 20 MG capsule Take 20 mg by mouth 3 (three) times a week. 3 times a week M, W, F    . gabapentin (NEURONTIN) 300 MG capsule TAKE 1 TABLET AT BEDTIME    . IRON PO Take 325 mg by mouth 2 (two) times daily.     . metoprolol succinate (TOPROL-XL) 50 MG 24 hr tablet Take 1 tablet (50 mg total) by mouth daily. Take with or immediately following a  meal. 90 tablet 2  . simvastatin (ZOCOR) 20 MG tablet Take 1 tablet (20 mg total) by mouth at bedtime. 90 tablet 2  . tamsulosin (FLOMAX) 0.4 MG CAPS capsule Take 0.4 mg by mouth daily.    . valsartan-hydrochlorothiazide (DIOVAN-HCT) 320-12.5 MG per tablet Take 1 tablet by mouth daily. 90 tablet 2  . warfarin (COUMADIN) 5 MG tablet TAKE 2 TABLETS DAILY OR AS DIRECTED BY COUMADIN CLINIC 180 tablet 1   No current facility-administered medications on file prior to visit.   ALLERGIES REVIEWED IN EPIC -- No change SOCIAL AND FAMILY HISTORY REVIEWED IN EPIC -- NO change  Wt Readings from  Last 3 Encounters:  03/13/14 220 lb 1.6 oz (99.837 kg)  09/13/13 213 lb 9.6 oz (96.888 kg)    PHYSICAL EXAM BP 148/66 mmHg  Pulse 62  Ht 5\' 11"  (1.803 m)  Wt 220 lb 1.6 oz (99.837 kg)  BMI 30.71 kg/m2 General appearance: alert, cooperative, appears stated age, no distress and Healthy-appearing, well-nourished well-groomed. He answers questions appropriately. Neck: no carotid bruit, no JVD, supple, symmetrical, trachea midline and radiating aortic murmur Lungs: clear to auscultation bilaterally, normal percussion bilaterally and Nonlabored, good movement Heart: RRR with normal S1 with mild mechanical S2. Slightly lateral PMI with this soft S4. 2/6 musical SEM LUSB radiating to the carotids as well as base. No carotid delay.; No other M./R./G.  Abdomen: soft, non-tender; bowel sounds normal; no masses, no organomegaly; well-healing surgical scars. A Extremities: Trace edema on the right lower extremity with minimal left'; mild varicosities Pulses: 2+ and symmetric Neurologic: Alert and oriented X 3, normal strength and tone.    Adult ECG Report  Rate: 62;  Rhythm: normal sinus rhythm, ~1 AVB; or voltage to meet criteria for low voltage. Diffuse nonspecific ST and T-wave changes.  Narrative Interpretation: stable EKG  Recent Labs:  No recent labs available   ASSESSMENT / PLAN: CAD, multiple vessel No anginal chest as a pressure. No heart failure symptoms. Negative Myoview in 2013. Stable on beta blocker, calcium channel blocker and we'll aspirin and statin. Continue current management.  S/P CABG x 4 Mild 113 was negative. Consider rechecking in 2 years pending on symptoms.  S/P AVR (aortic valve replacement) and aortoplasty Stable last echo in October 2014.  Do not need to recheck an echo this year. We'll recheck next year. On warfarin.   Hypertension, essential Borderline control today. He is on stable dose of beta blocker which he can't really use because of his heart rate.  He is also somewhat lax, Diovan HCT and amlodipine. For now we'll continue to monitor his blood pressure. The only potential thing to do is increase the HCTZ component of his Diovan HCT versus switching to a more potent ARB.  Dyslipidemia, goal LDL below 70 On statin. Monitored by PCP.  Unfortunately are not seeing any labs recently.  Obesity (BMI 30-39.9) We talked about the importance of diet modification as well as in his exercise. He is just borderline in the obese category, and lose a few pounds will definitely help his blood pressure and lipid control as well.    Orders Placed This Encounter  Procedures  . EKG 12-Lead   No orders of the defined types were placed in this encounter.    Followup: 6 months   HARDING,DAVID W, M.D., M.S. Interventional Cardiologist   Pager # [phone]

## 2014-03-15 NOTE — Assessment & Plan Note (Signed)
No anginal chest as a pressure. No heart failure symptoms. Negative Myoview in 2013. Stable on beta blocker, calcium channel blocker and we'll aspirin and statin. Continue current management.

## 2014-03-15 NOTE — Assessment & Plan Note (Signed)
On statin. Monitored by PCP.  Unfortunately are not seeing any labs recently.

## 2014-03-15 NOTE — Progress Notes (Signed)
Addendum for labs that were obtained after the patient left  I also have blood pressure recordings that showed his stable blood pressures in the 130s over 50 so the last month.    01/28/2014 CBC: W 2.8, H./H. 13.1/37.1, P 1.3  01/14/2014 Lipid panel: TC 1.9, TG 127, HDL 55, LDL 69 -- at goal  01/14/2014: Chemistry: Na+ 135, K+ 4.1, Cl- 100, HCO3- 28, BUN 10, Cr 0.74, Glu 101, calcium 9.0;; total bili 0.6, alkaline phosphatase 62, AST/ALT 16/16, total protein 6.4, albumin 4.9  BP looks to be controlled. Lipid Panel @ goal.   Leonie Man, M.D., M.S. Interventional Cardiologist   Pager # [phone]

## 2014-03-15 NOTE — Assessment & Plan Note (Addendum)
Stable last echo in October 2014.  Do not need to recheck an echo this year. We'll recheck next year. On warfarin.

## 2014-03-15 NOTE — Assessment & Plan Note (Signed)
Mild 113 was negative. Consider rechecking in 2 years pending on symptoms.

## 2014-03-15 NOTE — Assessment & Plan Note (Signed)
Borderline control today. He is on stable dose of beta blocker which he can't really use because of his heart rate. He is also somewhat lax, Diovan HCT and amlodipine. For now we'll continue to monitor his blood pressure. The only potential thing to do is increase the HCTZ component of his Diovan HCT versus switching to a more potent ARB.

## 2014-03-15 NOTE — Assessment & Plan Note (Signed)
We talked about the importance of diet modification as well as in his exercise. He is just borderline in the obese category, and lose a few pounds will definitely help his blood pressure and lipid control as well.

## 2014-03-18 ENCOUNTER — Other Ambulatory Visit: Payer: Self-pay

## 2014-03-19 ENCOUNTER — Ambulatory Visit (INDEPENDENT_AMBULATORY_CARE_PROVIDER_SITE_OTHER): Payer: Medicare HMO | Admitting: Pharmacist Clinician (PhC)/ Clinical Pharmacy Specialist

## 2014-03-19 DIAGNOSIS — Z954 Presence of other heart-valve replacement: Secondary | ICD-10-CM

## 2014-03-19 DIAGNOSIS — Z952 Presence of prosthetic heart valve: Secondary | ICD-10-CM

## 2014-03-19 LAB — PROTIME-INR
INR: 2.9 — ABNORMAL HIGH (ref 0.8–1.2)
PROTHROMBIN TIME: 30 s — AB (ref 9.1–12.0)

## 2014-04-10 ENCOUNTER — Other Ambulatory Visit: Payer: Self-pay

## 2014-04-10 LAB — PROTIME-INR: INR: 4.1 — AB (ref 0.9–1.1)

## 2014-04-11 ENCOUNTER — Ambulatory Visit (INDEPENDENT_AMBULATORY_CARE_PROVIDER_SITE_OTHER): Payer: Medicare HMO | Admitting: Pharmacist Clinician (PhC)/ Clinical Pharmacy Specialist

## 2014-04-11 DIAGNOSIS — Z954 Presence of other heart-valve replacement: Secondary | ICD-10-CM

## 2014-04-11 DIAGNOSIS — Z952 Presence of prosthetic heart valve: Secondary | ICD-10-CM

## 2014-04-11 LAB — PROTIME-INR
INR: 4.1 — AB (ref 0.8–1.2)
PROTHROMBIN TIME: 43 s — AB (ref 9.1–12.0)

## 2014-04-25 ENCOUNTER — Other Ambulatory Visit: Payer: Self-pay

## 2014-04-26 LAB — PROTIME-INR
INR: 4.2 — AB (ref 0.8–1.2)
PROTHROMBIN TIME: 44 s — AB (ref 9.1–12.0)

## 2014-04-28 ENCOUNTER — Ambulatory Visit (INDEPENDENT_AMBULATORY_CARE_PROVIDER_SITE_OTHER): Payer: Medicare HMO | Admitting: Pharmacist Clinician (PhC)/ Clinical Pharmacy Specialist

## 2014-04-28 DIAGNOSIS — Z954 Presence of other heart-valve replacement: Secondary | ICD-10-CM

## 2014-04-28 DIAGNOSIS — Z952 Presence of prosthetic heart valve: Secondary | ICD-10-CM

## 2014-05-14 ENCOUNTER — Other Ambulatory Visit: Payer: Self-pay

## 2014-05-14 LAB — PROTIME-INR: INR: 2.8 — AB (ref 0.9–1.1)

## 2014-05-15 ENCOUNTER — Ambulatory Visit (INDEPENDENT_AMBULATORY_CARE_PROVIDER_SITE_OTHER): Payer: Medicare HMO | Admitting: Pharmacist Clinician (PhC)/ Clinical Pharmacy Specialist

## 2014-05-15 DIAGNOSIS — Z954 Presence of other heart-valve replacement: Secondary | ICD-10-CM

## 2014-05-15 DIAGNOSIS — Z952 Presence of prosthetic heart valve: Secondary | ICD-10-CM

## 2014-05-15 LAB — PROTIME-INR
INR: 2.8 — ABNORMAL HIGH (ref 0.8–1.2)
PROTHROMBIN TIME: 29.7 s — AB (ref 9.1–12.0)

## 2014-06-10 ENCOUNTER — Other Ambulatory Visit: Payer: Self-pay

## 2014-06-10 LAB — PROTIME-INR: INR: 3.2 — AB (ref <=1.1)

## 2014-06-11 ENCOUNTER — Ambulatory Visit (INDEPENDENT_AMBULATORY_CARE_PROVIDER_SITE_OTHER): Payer: Medicare HMO | Admitting: Pharmacist Clinician (PhC)/ Clinical Pharmacy Specialist

## 2014-06-11 DIAGNOSIS — Z952 Presence of prosthetic heart valve: Secondary | ICD-10-CM

## 2014-06-11 DIAGNOSIS — Z954 Presence of other heart-valve replacement: Secondary | ICD-10-CM

## 2014-06-11 LAB — PROTIME-INR
INR: 3.2 — AB (ref 0.8–1.2)
Prothrombin Time: 33.7 s — ABNORMAL HIGH (ref 9.1–12.0)

## 2014-07-04 ENCOUNTER — Other Ambulatory Visit: Payer: Self-pay | Admitting: Pharmacist Clinician (PhC)/ Clinical Pharmacy Specialist

## 2014-07-07 ENCOUNTER — Other Ambulatory Visit: Payer: Self-pay | Admitting: Cardiology

## 2014-07-07 NOTE — Telephone Encounter (Signed)
Rx has been sent to the pharmacy electronically. ° °

## 2014-07-11 ENCOUNTER — Other Ambulatory Visit: Payer: Self-pay | Admitting: Cardiology

## 2014-07-12 LAB — PROTIME-INR
INR: 3.5 — ABNORMAL HIGH (ref 0.8–1.2)
Prothrombin Time: 37.2 s — ABNORMAL HIGH (ref 9.1–12.0)

## 2014-07-24 ENCOUNTER — Ambulatory Visit (INDEPENDENT_AMBULATORY_CARE_PROVIDER_SITE_OTHER): Payer: Medicare HMO | Admitting: Pharmacist Clinician (PhC)/ Clinical Pharmacy Specialist

## 2014-07-24 DIAGNOSIS — Z952 Presence of prosthetic heart valve: Secondary | ICD-10-CM

## 2014-07-24 DIAGNOSIS — Z954 Presence of other heart-valve replacement: Secondary | ICD-10-CM

## 2014-08-05 ENCOUNTER — Telehealth: Payer: Self-pay | Admitting: Cardiology

## 2014-08-05 NOTE — Telephone Encounter (Signed)
Received records from [facility] for appointment on 09/11/14 with Dr Ellyn Hack.  Records given to [facility] (medical records) for Dr Allison Quarry schedule on 09/11/14. lp

## 2014-08-20 ENCOUNTER — Other Ambulatory Visit: Payer: Self-pay

## 2014-08-20 LAB — PROTIME-INR: INR: 3.6 — AB (ref 0.9–1.1)

## 2014-08-21 ENCOUNTER — Ambulatory Visit (INDEPENDENT_AMBULATORY_CARE_PROVIDER_SITE_OTHER): Payer: Medicare HMO | Admitting: Pharmacist Clinician (PhC)/ Clinical Pharmacy Specialist

## 2014-08-21 DIAGNOSIS — Z954 Presence of other heart-valve replacement: Secondary | ICD-10-CM

## 2014-08-21 DIAGNOSIS — Z952 Presence of prosthetic heart valve: Secondary | ICD-10-CM

## 2014-08-21 LAB — PROTIME-INR
INR: 3.6 — ABNORMAL HIGH (ref 0.8–1.2)
Prothrombin Time: 37.5 s — ABNORMAL HIGH (ref 9.1–12.0)

## 2014-09-11 ENCOUNTER — Ambulatory Visit (INDEPENDENT_AMBULATORY_CARE_PROVIDER_SITE_OTHER): Payer: Medicare HMO | Admitting: Cardiology

## 2014-09-11 ENCOUNTER — Encounter: Payer: Self-pay | Admitting: Cardiology

## 2014-09-11 VITALS — BP 142/60 | HR 65 | Ht 71.0 in | Wt 216.4 lb

## 2014-09-11 DIAGNOSIS — I10 Essential (primary) hypertension: Secondary | ICD-10-CM

## 2014-09-11 DIAGNOSIS — Z951 Presence of aortocoronary bypass graft: Secondary | ICD-10-CM | POA: Diagnosis not present

## 2014-09-11 DIAGNOSIS — I2510 Atherosclerotic heart disease of native coronary artery without angina pectoris: Secondary | ICD-10-CM | POA: Diagnosis not present

## 2014-09-11 DIAGNOSIS — E785 Hyperlipidemia, unspecified: Secondary | ICD-10-CM

## 2014-09-11 DIAGNOSIS — Z952 Presence of prosthetic heart valve: Secondary | ICD-10-CM

## 2014-09-11 DIAGNOSIS — E669 Obesity, unspecified: Secondary | ICD-10-CM

## 2014-09-11 DIAGNOSIS — Z954 Presence of other heart-valve replacement: Secondary | ICD-10-CM | POA: Diagnosis not present

## 2014-09-11 NOTE — Progress Notes (Signed)
PCP: Olin Hauser, MD  Clinic Note: Chief Complaint  Patient presents with  . Follow-up    6 month. has had a twinge on his right lower leg. no other concerns    HPI: Erik Meyer is a 74 y.o. 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 He also has pretty significant venous stasis with swelling mostly in the right leg. He is very active, exercising at the [facility] 4-5 days a week doing vigorous water aerobics - this alleviates the problem of having chronic back pain.  Past Medical History  Diagnosis Date  . Hx of irritable bowel syndrome   . CAD, multiple vessel 2004    Found during preop cath for [facility] AVR procedure; Myoview 5/'13: No ischemia or infarct, EF 53%  . S/P CABG x 4 2004    LIMA-LAD, SVG-D1, SVG- PDA-PLA (along with AVR)  . S/P AVR (aortic valve replacement) and aortoplasty 2004    Dr. Cyndia BentDeneen Harts procedure - [employer] aVR (25 mm prosthesis) with aortic root conduit;; Echo 10/'14: EF 55-60%, Mod Conc LVH, Gr 1 DD, Well Seated AoV Mech Prosthesis with normal P gradients (no stenosis), Mod-Severe LA dilation  . Monoclonal gammopathy of undetermined significance   . History of SBO (small bowel obstruction) March 2015    Abdominal surgery with LOA  . Lesion of right native kidney  08/20/2013    Abdominal US : 2.4 cm x 1.7 complex lesion in the upper pole the right kidney in March of 2012 concerning for RCC although enlarging hemorrhagic cyst could have this appearance as well.,   . Dyslipidemia, goal LDL below 70     Monitored by PCP. On statin (last labs scan from September 2015: TC 149, TG 127, HDL 55, LDL 69)  . Hypertension, essential   . Anemia     Interval History: He continues to do very well without any major complaints. The biggest issue is aching and twinging in his right leg. Swelling is worse in the right leg than left. He has some dull aching and twinges in his leg as well  but no restless leg type symptoms. From a cardiac standpoint despite his vigorous activity, he denies any resting or exertional chest tightness/pressure or dyspnea. He will get dyspneic if he tries to walk up a hill rapidly, but is probably more limited by his back then by dyspnea -- this is been a chronic finding for him for about 2 years now.  Cardiovascular ROS: positive for - edema, murmur and Dyspnea with significant exertion negative for - chest pain, irregular heartbeat, loss of consciousness, orthopnea, palpitations, paroxysmal nocturnal dyspnea, rapid heart rate, shortness of breath or TIA/amaurosis fugax, syncope/near syncope.  ROS: A comprehensive was performed. Review of Systems  Respiratory: Positive for shortness of breath (with increasd exertion).   Cardiovascular: Positive for leg swelling (RLE >> L). Negative for chest pain and claudication.  Gastrointestinal: Negative for blood in stool and melena.  Genitourinary: Positive for hematuria.  Skin:       LE skin discolration  Neurological: Negative for headaches.       Tingling in RLE  Endo/Heme/Allergies: Bruises/bleeds easily.  Psychiatric/Behavioral: Negative.   All other systems reviewed and are negative.  Current Outpatient Prescriptions on File Prior to Visit  Medication Sig Dispense Refill  . amLODipine (NORVASC) 10 MG tablet TAKE 1 TABLET EVERY DAY 90 tablet 1  . ampicillin (PRINCIPEN) 500 MG  capsule Take 500 mg by mouth daily.     Marland Kitchen aspirin 81 MG tablet Take 81 mg by mouth daily.    Marland Kitchen esomeprazole (NEXIUM) 20 MG capsule Take 20 mg by mouth 3 (three) times a week. 3 times a week M, W, F    . IRON PO Take 325 mg by mouth 2 (two) times daily.     . metoprolol succinate (TOPROL-XL) 50 MG 24 hr tablet TAKE 1 TABLET DAILY WITH OR IMMEDIATELY FOLLOWING A MEAL. 90 tablet 1  . simvastatin (ZOCOR) 20 MG tablet TAKE 1 TABLET AT BEDTIME 90 tablet 1  . tamsulosin (FLOMAX) 0.4 MG CAPS capsule Take 0.4 mg by mouth daily.    .  valsartan-hydrochlorothiazide (DIOVAN-HCT) 320-12.5 MG per tablet TAKE 1 TABLET EVERY DAY 90 tablet 1  . warfarin (COUMADIN) 5 MG tablet TAKE 2 TABLETS DAILY OR AS DIRECTED BY COUMADIN CLINIC (Patient taking differently: TAKE 2 TABLETS DAILY OR AS DIRECTED BY COUMADIN CLINIC and 7.5 mondays and fridays) 180 tablet 1   No current facility-administered medications on file prior to visit.   Allergies  Allergen Reactions  . Doxycycline Rash    History  Substance Use Topics  . Smoking status: Former Smoker    Quit date: 06/16/1997  . Smokeless tobacco: Not on file  . Alcohol Use: Yes   Family History  Problem Relation Age of Onset  . Cancer Mother   . Lung cancer Father    Wt Readings from Last 3 Encounters:  09/11/14 98.158 kg (216 lb 6.4 oz)  03/13/14 99.837 kg (220 lb 1.6 oz)  09/13/13 96.888 kg (213 lb 9.6 oz)   PHYSICAL EXAM BP 142/60 mmHg  Pulse 65  Ht 5\' 11"  (1.803 m)  Wt 98.158 kg (216 lb 6.4 oz)  BMI 30.19 kg/m2 General appearance: alert, cooperative, appears stated age, no distress and Healthy-appearing, well-nourished well-groomed. He answers questions appropriately. Neck: no carotid bruit, no JVD, supple, symmetrical, trachea midline and radiating aortic murmur Lungs: clear to auscultation bilaterally, normal percussion bilaterally and Nonlabored, good movement Heart: RRR with normal S1 with mild mechanical S2. Slightly lateral PMI with this soft S4. 2/6 musical SEM LUSB radiating to the carotids as well as base. No carotid delay.; No other M./R./G.  Abdomen: soft, non-tender; bowel sounds normal; no masses, no organomegaly; well-healing surgical scars. A Extremities: Trace edema on the right lower extremity with minimal left'; mild varicosities Pulses: 2+ and symmetric Neurologic: Alert and oriented X 3, normal strength and tone.    Adult ECG Report  Rate: 65;  Rhythm: normal sinus rhythm, ~1 AVB (204). Diffuse nonspecific ST and T-wave changes.  Narrative  Interpretation: stable EKG  Recent Labs:  01/28/2014 CBC: W 2.8, H./H. 13.1/37.1, P 1.3 01/14/2014 Lipid panel: TC 190, TG 127, HDL 55, LDL 69 -- at goal 01/14/2014: Chemistry: Na+ 135, K+ 4.1, Cl- 100, HCO3- 28, BUN 10, Cr 0.74, Glu 101, calcium 9.0;; total bili 0.6, alkaline phosphatase 62, AST/ALT 16/16, total protein 6.4, albumin 4.9  BP looks to be controlled. Lipid Panel @ goal.  ASSESSMENT / PLAN: Hx of aortic valve replacement, mechanical His mechanical valve seems to be functioning quite well.  Looked fine in October 2014. The plan was for two-year follow-up echocardiogram which can be performed in October 2016. I will see him after that for six-month follow-up. He remains on long-term anticoagulation with warfarin for his mechanical valve.  INR monitored here by our pharmacy team.   CAD, multiple vessel Really denies any angina  or chest pressure with rest or exertion. He had a negative Myoview in 2013, so no need to follow-up until next year. On beta blocker, ARB, calcium channel blocker as well as aspirin and statin.   S/P CABG x 4 Myoview in 2013 was negative. Plan was to recheck in November 2017 pending symptoms before that..   Dyslipidemia, goal LDL below 70 On statin. Most recent labs from September 2015 showed relatively good control on current dose of simvastatin. Unfortunately with amlodipine, we cannot increase simvastatin beyond 20 mg. Would have to consider different statin if his lipids do not remain stable.   Hypertension, essential He is pretty much on maximal medications with the exception of Toprol. Heart rate is 65, so I'm reluctant to increase Toprol. Would consider converting to carvedilol if pressures remain elevated. However currently relatively stable.     Orders Placed This Encounter  Procedures  . EKG 12-Lead  . Echocardiogram    Standing Status: Future     Number of Occurrences:      Standing Expiration Date: 12/11/2015    Order Specific  Question:  Where should this test be performed    Answer:  [facility]    Order Specific Question:  Complete or Limited study?    Answer:  Complete    Order Specific Question:  With Image Enhancing Agent or without Image Enhancing Agent?    Answer:  With Image Enhancing Agent    Order Specific Question:  Reason for exam-Echo    Answer:  Aortic Valve Disorder 424.1 / I35.9   Meds ordered this encounter  Medications  . amoxicillin (AMOXIL) 500 MG capsule    Sig: Take 1 capsule by mouth daily as needed.    Refill:  0  . DENTA 5000 PLUS 1.1 % CREA dental cream    Sig: Place 1 Squirt onto teeth daily.    Refill:  6  . Multiple Vitamins-Minerals (THERA-M) TABS    Sig: Take 1 tablet by mouth.   SCHEDULE IN OCT 2016--Your physician has requested that you have an echocardiogram. Echocardiography is a painless test that uses sound waves to create images of your heart. It provides your doctor with information about the size and shape of your heart and how well your heart's chambers and valves are working. This procedure takes approximately one hour. There are no restrictions for this procedure.  MAY USE SUPPORT ( COMPRESSION STOCKING) STOCKING- (MAY PURCHASE AT [place] OR DEPT STORE) FOR RIGHT LEG SWELLING.  CONTINUE WITH CURRENT MEDICATIONS  Followup: 6 months - after Echo    Leonie Man, M.D., M.S. Interventional Cardiologist   Pager # [phone]

## 2014-09-11 NOTE — Patient Instructions (Signed)
SCHEDULE IN OCT 2016--Your physician has requested that you have an echocardiogram. Echocardiography is a painless test that uses sound waves to create images of your heart. It provides your doctor with information about the size and shape of your heart and how well your heart's chambers and valves are working. This procedure takes approximately one hour. There are no restrictions for this procedure.  MAY USE SUPPORT ( COMPRESSION STOCKING) STOCKING- (MAY PURCHASE AT [place] OR DEPT STORE) FOR RIGHT LEG SWELLING.  CONTINUE WITH CURRENT MEDICATIONS  Your physician wants you to follow-up in [place].( F/U RESULTS ECHO ) You will receive a reminder letter in the mail two months in advance. If you don't receive a letter, please call our office to schedule the follow-up appointment.

## 2014-09-13 NOTE — Assessment & Plan Note (Addendum)
His mechanical valve seems to be functioning quite well.  Looked fine in October 2014. The plan was for two-year follow-up echocardiogram which can be performed in October 2016. I will see him after that for six-month follow-up. He remains on long-term anticoagulation with warfarin for his mechanical valve.  INR monitored here by our pharmacy team.

## 2014-09-13 NOTE — Assessment & Plan Note (Addendum)
He is pretty much on maximal medications with the exception of Toprol. Heart rate is 65, so I'm reluctant to increase Toprol. Would consider converting to carvedilol if pressures remain elevated. However currently relatively stable.

## 2014-09-13 NOTE — Assessment & Plan Note (Addendum)
On statin. Most recent labs from September 2015 showed relatively good control on current dose of simvastatin. Unfortunately with amlodipine, we cannot increase simvastatin beyond 20 mg. Would have to consider different statin if his lipids do not remain stable.

## 2014-09-13 NOTE — Assessment & Plan Note (Signed)
Really denies any angina or chest pressure with rest or exertion. He had a negative Myoview in 2013, so no need to follow-up until next year. On beta blocker, ARB, calcium channel blocker as well as aspirin and statin.

## 2014-09-13 NOTE — Assessment & Plan Note (Signed)
Myoview in 2013 was negative. Plan was to recheck in November 2017 pending symptoms before that.Marland Kitchen

## 2014-09-14 ENCOUNTER — Encounter: Payer: Self-pay | Admitting: Cardiology

## 2014-09-17 ENCOUNTER — Telehealth (HOSPITAL_COMMUNITY): Payer: Self-pay | Admitting: *Deleted

## 2014-09-19 ENCOUNTER — Other Ambulatory Visit: Payer: Self-pay

## 2014-09-20 LAB — PROTIME-INR
INR: 4 — AB (ref 0.8–1.2)
PROTHROMBIN TIME: 42.2 s — AB (ref 9.1–12.0)

## 2014-09-24 ENCOUNTER — Ambulatory Visit (INDEPENDENT_AMBULATORY_CARE_PROVIDER_SITE_OTHER): Payer: Medicare HMO | Admitting: Pharmacist Clinician (PhC)/ Clinical Pharmacy Specialist

## 2014-09-24 DIAGNOSIS — Z954 Presence of other heart-valve replacement: Secondary | ICD-10-CM

## 2014-09-24 DIAGNOSIS — Z952 Presence of prosthetic heart valve: Secondary | ICD-10-CM

## 2014-10-08 ENCOUNTER — Other Ambulatory Visit: Payer: Self-pay

## 2014-10-08 LAB — PROTIME-INR: INR: 2.9 — AB (ref 0.9–1.1)

## 2014-10-09 ENCOUNTER — Ambulatory Visit (INDEPENDENT_AMBULATORY_CARE_PROVIDER_SITE_OTHER): Payer: Medicare HMO | Admitting: Pharmacist Clinician (PhC)/ Clinical Pharmacy Specialist

## 2014-10-09 DIAGNOSIS — Z952 Presence of prosthetic heart valve: Secondary | ICD-10-CM

## 2014-10-09 DIAGNOSIS — Z954 Presence of other heart-valve replacement: Secondary | ICD-10-CM

## 2014-10-09 LAB — PROTIME-INR
INR: 2.9 — ABNORMAL HIGH (ref 0.8–1.2)
Prothrombin Time: 30.5 s — ABNORMAL HIGH (ref 9.1–12.0)

## 2014-11-04 ENCOUNTER — Other Ambulatory Visit: Payer: Self-pay

## 2014-11-05 ENCOUNTER — Ambulatory Visit (INDEPENDENT_AMBULATORY_CARE_PROVIDER_SITE_OTHER): Payer: Medicare HMO | Admitting: Pharmacist Clinician (PhC)/ Clinical Pharmacy Specialist

## 2014-11-05 DIAGNOSIS — Z952 Presence of prosthetic heart valve: Secondary | ICD-10-CM

## 2014-11-05 DIAGNOSIS — Z954 Presence of other heart-valve replacement: Secondary | ICD-10-CM

## 2014-11-05 LAB — PROTIME-INR
INR: 2.8 — ABNORMAL HIGH (ref 0.8–1.2)
PROTHROMBIN TIME: 29.8 s — AB (ref 9.1–12.0)

## 2014-11-20 ENCOUNTER — Other Ambulatory Visit: Payer: Self-pay | Admitting: Cardiology

## 2014-11-21 NOTE — Telephone Encounter (Signed)
Rx(s) sent to pharmacy electronically.  

## 2014-12-08 ENCOUNTER — Other Ambulatory Visit: Payer: Self-pay | Admitting: Cardiology

## 2014-12-09 LAB — PROTIME-INR
INR: 2 — ABNORMAL HIGH (ref 0.8–1.2)
Prothrombin Time: 20.7 s — ABNORMAL HIGH (ref 9.1–12.0)

## 2014-12-10 ENCOUNTER — Ambulatory Visit (INDEPENDENT_AMBULATORY_CARE_PROVIDER_SITE_OTHER): Payer: Medicare HMO | Admitting: Pharmacist Clinician (PhC)/ Clinical Pharmacy Specialist

## 2014-12-10 DIAGNOSIS — Z952 Presence of prosthetic heart valve: Secondary | ICD-10-CM

## 2014-12-10 DIAGNOSIS — Z954 Presence of other heart-valve replacement: Secondary | ICD-10-CM

## 2014-12-22 ENCOUNTER — Other Ambulatory Visit: Payer: Self-pay | Admitting: Cardiology

## 2014-12-23 LAB — PROTIME-INR
INR: 1.9 — ABNORMAL HIGH (ref 0.8–1.2)
Prothrombin Time: 19.2 s — ABNORMAL HIGH (ref 9.1–12.0)

## 2014-12-25 ENCOUNTER — Ambulatory Visit (INDEPENDENT_AMBULATORY_CARE_PROVIDER_SITE_OTHER): Payer: Medicare HMO | Admitting: Pharmacist Clinician (PhC)/ Clinical Pharmacy Specialist

## 2014-12-25 DIAGNOSIS — Z954 Presence of other heart-valve replacement: Secondary | ICD-10-CM

## 2014-12-25 DIAGNOSIS — Z952 Presence of prosthetic heart valve: Secondary | ICD-10-CM

## 2015-01-05 ENCOUNTER — Other Ambulatory Visit: Payer: Self-pay | Admitting: Cardiology

## 2015-01-06 NOTE — Telephone Encounter (Signed)
Amlodipine and metoprolol refilled 11/21/14 #90 with 2 refills

## 2015-01-19 ENCOUNTER — Other Ambulatory Visit: Payer: Self-pay | Admitting: Cardiology

## 2015-01-20 LAB — PROTIME-INR
INR: 2.9 — ABNORMAL HIGH (ref 0.8–1.2)
Prothrombin Time: 29.8 s — ABNORMAL HIGH (ref 9.1–12.0)

## 2015-01-21 ENCOUNTER — Ambulatory Visit (INDEPENDENT_AMBULATORY_CARE_PROVIDER_SITE_OTHER): Payer: Medicare HMO | Admitting: Pharmacist Clinician (PhC)/ Clinical Pharmacy Specialist

## 2015-01-21 DIAGNOSIS — Z954 Presence of other heart-valve replacement: Secondary | ICD-10-CM

## 2015-01-21 DIAGNOSIS — Z952 Presence of prosthetic heart valve: Secondary | ICD-10-CM

## 2015-02-10 ENCOUNTER — Other Ambulatory Visit: Payer: Self-pay | Admitting: Cardiology

## 2015-02-11 ENCOUNTER — Ambulatory Visit (INDEPENDENT_AMBULATORY_CARE_PROVIDER_SITE_OTHER): Payer: Medicare HMO | Admitting: Pharmacist Clinician (PhC)/ Clinical Pharmacy Specialist

## 2015-02-11 DIAGNOSIS — Z952 Presence of prosthetic heart valve: Secondary | ICD-10-CM

## 2015-02-11 DIAGNOSIS — Z954 Presence of other heart-valve replacement: Secondary | ICD-10-CM

## 2015-02-11 DIAGNOSIS — Z7901 Long term (current) use of anticoagulants: Secondary | ICD-10-CM

## 2015-02-11 LAB — PROTIME-INR
INR: 3.1 — ABNORMAL HIGH (ref 0.8–1.2)
Prothrombin Time: 31 s — ABNORMAL HIGH (ref 9.1–12.0)

## 2015-03-05 ENCOUNTER — Telehealth: Payer: Self-pay | Admitting: Pharmacist Clinician (PhC)/ Clinical Pharmacy Specialist

## 2015-03-05 NOTE — Telephone Encounter (Signed)
Pt called, he was in hospital in [place] for bowel obstruction, discharged this am with enoxaparin.  Wanted to know how to dose warfarin until INR check.    Returned call, will have patient take 12.5 mg x 2 days then resume normal dose (10 mg qd x 7.5 mg MF).  Will come Monday for INR follow up.

## 2015-03-09 ENCOUNTER — Ambulatory Visit (INDEPENDENT_AMBULATORY_CARE_PROVIDER_SITE_OTHER): Payer: Medicare HMO | Admitting: *Deleted

## 2015-03-09 DIAGNOSIS — Z954 Presence of other heart-valve replacement: Secondary | ICD-10-CM

## 2015-03-09 DIAGNOSIS — Z7901 Long term (current) use of anticoagulants: Secondary | ICD-10-CM

## 2015-03-09 DIAGNOSIS — Z952 Presence of prosthetic heart valve: Secondary | ICD-10-CM

## 2015-03-09 LAB — POCT INR: INR: 2.7

## 2015-03-10 HISTORY — PX: TRANSTHORACIC ECHOCARDIOGRAM: SHX275

## 2015-03-16 ENCOUNTER — Other Ambulatory Visit: Payer: Self-pay | Admitting: Cardiology

## 2015-03-17 LAB — PROTIME-INR
INR: 2.3 — ABNORMAL HIGH (ref 0.8–1.2)
Prothrombin Time: 23.2 s — ABNORMAL HIGH (ref 9.1–12.0)

## 2015-03-18 ENCOUNTER — Ambulatory Visit (INDEPENDENT_AMBULATORY_CARE_PROVIDER_SITE_OTHER): Payer: Medicare HMO | Admitting: Pharmacist

## 2015-03-18 DIAGNOSIS — Z7901 Long term (current) use of anticoagulants: Secondary | ICD-10-CM

## 2015-03-18 DIAGNOSIS — Z952 Presence of prosthetic heart valve: Secondary | ICD-10-CM

## 2015-03-18 DIAGNOSIS — Z954 Presence of other heart-valve replacement: Secondary | ICD-10-CM

## 2015-03-23 ENCOUNTER — Other Ambulatory Visit: Payer: Self-pay

## 2015-03-23 ENCOUNTER — Ambulatory Visit (HOSPITAL_COMMUNITY): Payer: Medicare HMO | Attending: Cardiovascular Disease

## 2015-03-23 DIAGNOSIS — E669 Obesity, unspecified: Secondary | ICD-10-CM | POA: Diagnosis not present

## 2015-03-23 DIAGNOSIS — Z952 Presence of prosthetic heart valve: Secondary | ICD-10-CM

## 2015-03-23 DIAGNOSIS — Z6830 Body mass index (BMI) 30.0-30.9, adult: Secondary | ICD-10-CM | POA: Insufficient documentation

## 2015-03-23 DIAGNOSIS — Z951 Presence of aortocoronary bypass graft: Secondary | ICD-10-CM | POA: Diagnosis not present

## 2015-03-23 DIAGNOSIS — E785 Hyperlipidemia, unspecified: Secondary | ICD-10-CM

## 2015-03-23 DIAGNOSIS — I10 Essential (primary) hypertension: Secondary | ICD-10-CM

## 2015-03-23 DIAGNOSIS — I059 Rheumatic mitral valve disease, unspecified: Secondary | ICD-10-CM | POA: Diagnosis not present

## 2015-03-23 DIAGNOSIS — Z87891 Personal history of nicotine dependence: Secondary | ICD-10-CM | POA: Diagnosis not present

## 2015-03-23 DIAGNOSIS — Z954 Presence of other heart-valve replacement: Secondary | ICD-10-CM

## 2015-03-23 DIAGNOSIS — I517 Cardiomegaly: Secondary | ICD-10-CM | POA: Insufficient documentation

## 2015-03-23 NOTE — Progress Notes (Signed)
Quick Note:  Echo results: Good news:  Essentially normal echocardiogram and normal pump function. Prosthetic valve has normal function.  No signs to suggest heart attack.. EF: 50-55 %. Paradoxical septal motion consistent with poststernotomy. Likely grade 1 diastolic dysfunction. Bilateral atria are enlarged.  Leonie Man, MD    ______

## 2015-03-24 ENCOUNTER — Telehealth: Payer: Self-pay | Admitting: *Deleted

## 2015-03-24 NOTE — Telephone Encounter (Signed)
Left message to call back Release to my chart 

## 2015-03-24 NOTE — Telephone Encounter (Signed)
-----   Message from Leonie Man, MD sent at 03/23/2015  5:30 PM EST ----- Echo results: Good news:  Essentially normal echocardiogram and normal pump function. Prosthetic valve has normal function.   No signs to suggest heart attack.. EF: 50-55 %. Paradoxical septal motion consistent with poststernotomy. Likely grade 1 diastolic dysfunction. Bilateral atria are enlarged.  Leonie Man, MD

## 2015-03-30 ENCOUNTER — Ambulatory Visit (INDEPENDENT_AMBULATORY_CARE_PROVIDER_SITE_OTHER): Payer: Medicare HMO | Admitting: Cardiology

## 2015-03-30 VITALS — BP 134/66 | HR 70 | Ht 71.5 in | Wt 214.7 lb

## 2015-03-30 DIAGNOSIS — I2510 Atherosclerotic heart disease of native coronary artery without angina pectoris: Secondary | ICD-10-CM

## 2015-03-30 DIAGNOSIS — Z954 Presence of other heart-valve replacement: Secondary | ICD-10-CM

## 2015-03-30 DIAGNOSIS — I10 Essential (primary) hypertension: Secondary | ICD-10-CM | POA: Diagnosis not present

## 2015-03-30 DIAGNOSIS — Z951 Presence of aortocoronary bypass graft: Secondary | ICD-10-CM | POA: Diagnosis not present

## 2015-03-30 DIAGNOSIS — Z952 Presence of prosthetic heart valve: Secondary | ICD-10-CM

## 2015-03-30 DIAGNOSIS — E785 Hyperlipidemia, unspecified: Secondary | ICD-10-CM

## 2015-03-30 DIAGNOSIS — E669 Obesity, unspecified: Secondary | ICD-10-CM

## 2015-03-30 NOTE — Progress Notes (Signed)
PCP: Olin Hauser, MD  Clinic Note: Chief Complaint  Patient presents with  . Follow-up    pt states no chest pain no edema no light headedness or dizziness no SOB  . Coronary Artery Disease    HPI: Erik Meyer is a 74 y.o. male with a PMH below who presents today for 6 month f/u CAD-CABG w. AVR.Marland Kitchen He is very active, exercising at the [facility] 4-5 days a week doing vigorous water aerobics - this alleviates the problem of having chronic back pain.  Erik Meyer was last seen in May 2016 - doing well.  Recent Hospitalizations: SBO 2-3 weeks ago Jule Ser, [place]) - no surgery.  Studies Reviewed:   Echo 03/23/15:  - Left ventricle: The cavity size was mildly dilated. Mild focal basal hypertrophy of the septum. Systolic function was normal. EF 50%-55%. Although no diagnostic regional wall motion abnormality was identified, this possibility cannot be completely excluded on the basis of this study. Doppler parameters are consistent with abnormal left ventricular relaxation (grade 1 diastolic dysfunction). - Ventricular septum: Septal motion showed paradox. These changes are consistent with a post-thoracotomy state. - Aortic valve: A mechanical prosthesis was present and functioning normally. - Mitral valve: Calcified annulus. - Left atrium: The atrium was severely dilated. - Right atrium: The atrium was moderately dilated. - Atrial septum: No defect or patent foramen ovale was identified.  Interval History: Erik Meyer continues to do well from a cardiac standpoint. His major issue has been related to spell small bowel obstruction. He is been recovering from this and has not been exercising as much as usual, but as try to get back into his for 5 days a week water aerobics which he feels great doing. He is very happy with his exercise. Otherwise he is very limited by back and hip pain. Cardiac review of symptoms: No chest pain or shortness of breath with rest or  exertion.  No PND, orthopnea - chronic motion edema right greater than left mild venous stasis changes in the right leg No palpitations, lightheadedness, dizziness, weakness or syncope/near syncope. No TIA/amaurosis fugax symptoms.   ROS: A comprehensive was performed. Review of Systems  Constitutional: Negative for malaise/fatigue and diaphoresis.  Respiratory: Positive for shortness of breath (if he exerts himself. This is more because it is such a struggle to walk with his hip and back pain). Negative for cough.   Cardiovascular: Negative for claudication.  Gastrointestinal: Negative for blood in stool and melena.  Genitourinary: Negative for hematuria.  Musculoskeletal: Positive for back pain and joint pain (Hips).  Skin:        Didn't discoloration from venous stasis dermatitis more the right than left leg  Neurological: Negative for headaches.  Endo/Heme/Allergies: Does not bruise/bleed easily.  All other systems reviewed and are negative.   Past Medical History  Diagnosis Date  . Hx of irritable bowel syndrome   . CAD, multiple vessel 2004    Found during preop cath for [facility] AVR procedure; Myoview 5/'13: No ischemia or infarct, EF 53%  . S/P CABG x 4 2004    LIMA-LAD, SVG-D1, SVG- PDA-PLA (along with AVR)  . S/P AVR (aortic valve replacement) and aortoplasty 2004    Dr. Cyndia BentDeneen Harts procedure - St. Jude aVR (25 mm prosthesis) with aortic root conduit;; Echo 10/'14: EF 55-60%, Mod Conc LVH, Gr 1 DD, Well Seated AoV Mech Prosthesis with normal P gradients (no stenosis), Mod-Severe LA dilation --> follow-up echo November 2016: Well functioning mechanical valve. Paradoxical  septal motion. EF 50-55%. Severe LA dilation. Moderate RA dilation.   . Monoclonal gammopathy of undetermined significance   . History of SBO (small bowel obstruction) March 2015    Abdominal surgery with LOA  . Lesion of right native kidney  08/20/2013    Abdominal US : 2.4 cm x 1.7 complex lesion in  the upper pole the right kidney in March of 2012 concerning for RCC although enlarging hemorrhagic cyst could have this appearance as well.,   . Dyslipidemia, goal LDL below 70     Monitored by PCP. On statin (last labs scan from September 2015: TC 149, TG 127, HDL 55, LDL 69)  . Hypertension, essential   . Anemia   -- SBO in Oct 2016 -- no surgery.  Past Surgical History  Procedure Laterality Date  . Abdominal aortic aneurysm repair    . Asd repair  12/26/2002    Aortic valve replacement and replacement of aortic root aneurysm using a 25 mm St. JUDE mechanical valve cinduit with reimplantation of the coronary arteeries  . Coronary artery bypass graft  12/26/2002    LIMA to LAD,SVG to diagonal, SVG to PDA and PLA.   Marland Kitchen Ventral hernia repair      repair with [facility] by Dr Collene Mares [facility]  . Nm myoview ltd  10/04/2011    showed no ischemia;EF52%  . Cardiac catheterization  Aug  13,2004    diffuse  coronary disease,prior to his BENTALL PROCEDURE  . Transthoracic echocardiogram  October 2013    CW doppler gradient of 2.72m/ second across the aortic valve with a mean  gradient of 12 ,which was normal for valve, normal RSVP ANDSYSTOLIC FUNCTION WIYH DIASTOLIC RELAXATION ABNORMALITY  . Transthoracic echocardiogram  October 2014    EF 55-60%. Mild concentric LVH;  Grade 1 Diastolic Dysfunction.; St. Jude familiar mechanical prosthesis well seated. No evidence of stenosis. Gradients normal for  prosthesis; moderate to severely dilated left atrium.   . Partial colectomy  1994  . Laparoscopic lysis of adhesions  March 2015    performed in [place] for SBO   . Transthoracic echocardiogram  2014; 03/2015    Echo 10/'14: EF 55-60%, Mod Conc LVH, Gr 1 DD, Well Seated AoV Mech Prosthesis with normal P gradients (no stenosis), Mod-Severe LA dilation --> follow-up echo November 2016: Well functioning mechanical valve. Paradoxical septal motion. EF 50-55%. Severe LA dilation. Moderate RA dilation.       Prior to Admission medications   Medication Sig Start Date End Date Taking? Authorizing Provider  amLODipine (NORVASC) 10 MG tablet TAKE 1 TABLET BY MOUTH DAILY (KEEP APPOINTMENT FOR REFILLS) 11/21/14  Yes Leonie Man, MD  ampicillin (PRINCIPEN) 500 MG capsule Take 500 mg by mouth daily.  09/04/13  Yes Historical Provider, MD  aspirin 81 MG tablet Take 81 mg by mouth daily.   Yes Historical Provider, MD  celecoxib (CELEBREX) 200 MG capsule Take 200 mg by mouth as needed. 02/18/15  Yes Historical Provider, MD  DENTA 5000 PLUS 1.1 % CREA dental cream Place 1 Squirt onto teeth daily. 06/19/14  Yes Historical Provider, MD  esomeprazole (NEXIUM) 20 MG capsule Take 20 mg by mouth daily. 3 times a week M, W, F   Yes Historical Provider, MD  IRON PO Take 325 mg by mouth 2 (two) times daily.    Yes Historical Provider, MD  metoprolol succinate (TOPROL-XL) 50 MG 24 hr tablet Take 1 tablet (50 mg total) by mouth daily. 11/21/14  Yes Leonie Green  Ellyn Hack, MD  Multiple Vitamins-Minerals (THERA-M) TABS Take 1 tablet by mouth.   Yes Historical Provider, MD  simvastatin (ZOCOR) 20 MG tablet TAKE 1 TABLET AT BEDTIME 07/07/14  Yes Leonie Man, MD  tamsulosin (FLOMAX) 0.4 MG CAPS capsule Take 0.4 mg by mouth daily.   Yes Historical Provider, MD  valsartan-hydrochlorothiazide (DIOVAN-HCT) 320-12.5 MG per tablet TAKE 1 TABLET EVERY DAY 07/07/14  Yes Leonie Man, MD  warfarin (COUMADIN) 5 MG tablet TAKE 2 TABLETS DAILY OR AS DIRECTED BY COUMADIN CLINIC Patient taking differently: TAKE 2 TABLETS DAILY OR AS DIRECTED BY COUMADIN CLINIC and 7.5 mondays and fridays 07/04/14  Yes Leonie Man, MD   Allergies  Allergen Reactions  . Doxycycline Rash    Social History   Social History  . Marital Status: Married    Spouse Name: N/A  . Number of Children: N/A  . Years of Education: N/A   Social History Main Topics  . Smoking status: Former Smoker    Quit date: 06/16/1997  . Smokeless tobacco: None  .  Alcohol Use: Yes  . Drug Use: No  . Sexual Activity: Not Asked   Other Topics Concern  . None   Social History Narrative   Married gentleman with no children. Quit smoking in 1998.   Education: 2 years of college. 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 Claiborne Billings in [place]; GI: Dr. Janus Molder in [place]      Family History  Problem Relation Age of Onset  . Cancer Mother   . Lung cancer Father     Wt Readings from Last 3 Encounters:  03/30/15 214 lb 11.2 oz (97.387 kg)  09/11/14 216 lb 6.4 oz (98.158 kg)  03/13/14 220 lb 1.6 oz (99.837 kg)   PHYSICAL EXAM BP 134/66 mmHg  Pulse 70  Ht 5' 11.5" (1.816 m)  Wt 214 lb 11.2 oz (97.387 kg)  BMI 29.53 kg/m2 General appearance: alert, cooperative, appears stated age, no distress and Healthy-appearing, well-nourished well-groomed. He answers questions appropriately. Neck: no carotid bruit, no JVD, supple, symmetrical, trachea midline and radiating aortic murmur Lungs: clear to auscultation bilaterally, normal percussion bilaterally and Nonlabored, good movement Heart: RRR with normal S1 with mild mechanical S2. Slightly lateral PMI with this soft S4. 2/6 musical SEM LUSB radiating to the carotids as well as base. No carotid delay.; No other M./R./G.  Abdomen: soft, non-tender; bowel sounds normal; no masses, no organomegaly; well-healing surgical scars. A Extremities: Trace edema on the right lower extremity with minimal left'; mild varicosities; venous stasis dermatitis changes on the right leg Pulses: 2+ and symmetric Neurologic: Alert and oriented X 3, normal strength and tone.    Adult ECG Report n/a   Other studies Reviewed: Additional studies/ records that were reviewed today include:  Recent Labs: No results found for: CHOL, HDL,  LDLCALC, LDLDIRECT, TRIG, CHOLHDL    ASSESSMENT / PLAN: Problem List Items Addressed This Visit    S/P CABG x 4 (Chronic)    Would be due for a Myoview check after next visit.      S/P AVR (aortic valve replacement) and aortoplasty (Chronic)    Just had an echocardiogram performed. Stable bowel function. Stable aortic root. On warfarin for anticoagulation - no bleeding issues  Obesity (BMI 30-39.9) (Chronic)    Continues to lose weight with his exercise. His bowel obstruction hospitalization seemed to help with some of the weight loss as well.  Now no longer in the "obesity criteria " -- continue dietary modification and excised.      Hypertension, essential (Chronic)    Blood pressure actually looks very good on max dose of amlodipine, valsartan-HCT and moderate dose of Toprol.      Dyslipidemia, goal LDL below 70 (Chronic)    He is on simvastatin. Labs followed by PCP.      CAD, multiple vessel - Primary (Chronic)    This was found during evaluation for aortic valve procedure. He had multivessel disease but no anginal symptoms. Follow-up Myoview in 2013 was negative for ischemia. He is very active as far as his water aerobics and has no symptoms. Plan: Continue calcium channel blocker, beta blocker, statin and aspirin. Also on ARB.         Current medicines are reviewed at length with the patient today. (+/- concerns) none The following changes have been made: none Studies Ordered:   No orders of the defined types were placed in this encounter.    ROV 1 yr -- will need Myoview ordered @ f/u visit.     Leonie Man, M.D., M.S. Interventional Cardiologist   Pager # [phone]

## 2015-03-30 NOTE — Patient Instructions (Signed)
Your physician wants you to follow-up in [place]. You will receive a reminder letter in the mail two months in advance. If you don't receive a letter, please call our office to schedule the follow-up appointment.   NO CHANGE IN CURRENT MEDICATION  If you need a refill on your cardiac medications before your next appointment, please call your pharmacy.

## 2015-04-01 ENCOUNTER — Encounter: Payer: Self-pay | Admitting: Cardiology

## 2015-04-01 NOTE — Telephone Encounter (Signed)
Patient discussed results with Dr. Ellyn Hack at his recent visit.

## 2015-04-01 NOTE — Assessment & Plan Note (Signed)
Would be due for a Myoview check after next visit.

## 2015-04-01 NOTE — Assessment & Plan Note (Signed)
Blood pressure actually looks very good on max dose of amlodipine, valsartan-HCT and moderate dose of Toprol.

## 2015-04-01 NOTE — Assessment & Plan Note (Signed)
This was found during evaluation for aortic valve procedure. He had multivessel disease but no anginal symptoms. Follow-up Myoview in 2013 was negative for ischemia. He is very active as far as his water aerobics and has no symptoms. Plan: Continue calcium channel blocker, beta blocker, statin and aspirin. Also on ARB.

## 2015-04-01 NOTE — Assessment & Plan Note (Signed)
He is on simvastatin. Labs followed by PCP.

## 2015-04-01 NOTE — Assessment & Plan Note (Addendum)
Just had an echocardiogram performed. Stable bowel function. Stable aortic root. On warfarin for anticoagulation - no bleeding issues

## 2015-04-01 NOTE — Assessment & Plan Note (Signed)
Continues to lose weight with his exercise. His bowel obstruction hospitalization seemed to help with some of the weight loss as well.  Now no longer in the "obesity criteria " -- continue dietary modification and excised.

## 2015-04-07 ENCOUNTER — Other Ambulatory Visit: Payer: Self-pay | Admitting: Cardiology

## 2015-04-08 LAB — PROTIME-INR
INR: 3.6 — ABNORMAL HIGH (ref 0.8–1.2)
Prothrombin Time: 36.6 s — ABNORMAL HIGH (ref 9.1–12.0)

## 2015-04-09 ENCOUNTER — Ambulatory Visit (INDEPENDENT_AMBULATORY_CARE_PROVIDER_SITE_OTHER): Payer: Medicare HMO | Admitting: Pharmacist Clinician (PhC)/ Clinical Pharmacy Specialist

## 2015-04-09 DIAGNOSIS — Z952 Presence of prosthetic heart valve: Secondary | ICD-10-CM

## 2015-04-09 DIAGNOSIS — Z7901 Long term (current) use of anticoagulants: Secondary | ICD-10-CM

## 2015-04-09 DIAGNOSIS — Z954 Presence of other heart-valve replacement: Secondary | ICD-10-CM

## 2015-04-23 ENCOUNTER — Other Ambulatory Visit: Payer: Self-pay | Admitting: Pharmacist Clinician (PhC)/ Clinical Pharmacy Specialist

## 2015-05-06 ENCOUNTER — Other Ambulatory Visit: Payer: Self-pay | Admitting: Cardiology

## 2015-05-07 LAB — PROTIME-INR
INR: 3.7 — ABNORMAL HIGH (ref 0.8–1.2)
PROTHROMBIN TIME: 37.5 s — AB (ref 9.1–12.0)

## 2015-05-08 ENCOUNTER — Ambulatory Visit (INDEPENDENT_AMBULATORY_CARE_PROVIDER_SITE_OTHER): Payer: Medicare HMO | Admitting: Pharmacist Clinician (PhC)/ Clinical Pharmacy Specialist

## 2015-05-08 DIAGNOSIS — Z952 Presence of prosthetic heart valve: Secondary | ICD-10-CM

## 2015-05-08 DIAGNOSIS — Z7901 Long term (current) use of anticoagulants: Secondary | ICD-10-CM

## 2015-05-08 DIAGNOSIS — Z954 Presence of other heart-valve replacement: Secondary | ICD-10-CM

## 2015-06-09 ENCOUNTER — Other Ambulatory Visit: Payer: Self-pay | Admitting: Cardiology

## 2015-06-10 ENCOUNTER — Ambulatory Visit (INDEPENDENT_AMBULATORY_CARE_PROVIDER_SITE_OTHER): Payer: Medicare HMO | Admitting: Pharmacist Clinician (PhC)/ Clinical Pharmacy Specialist

## 2015-06-10 DIAGNOSIS — Z954 Presence of other heart-valve replacement: Secondary | ICD-10-CM

## 2015-06-10 DIAGNOSIS — Z7901 Long term (current) use of anticoagulants: Secondary | ICD-10-CM

## 2015-06-10 DIAGNOSIS — Z952 Presence of prosthetic heart valve: Secondary | ICD-10-CM

## 2015-06-10 LAB — PROTIME-INR
INR: 4.2 — AB (ref 0.8–1.2)
PROTHROMBIN TIME: 42.9 s — AB (ref 9.1–12.0)

## 2015-07-01 ENCOUNTER — Other Ambulatory Visit: Payer: Self-pay | Admitting: Cardiology

## 2015-07-02 ENCOUNTER — Ambulatory Visit (INDEPENDENT_AMBULATORY_CARE_PROVIDER_SITE_OTHER): Payer: Medicare HMO | Admitting: Pharmacist Clinician (PhC)/ Clinical Pharmacy Specialist

## 2015-07-02 DIAGNOSIS — Z954 Presence of other heart-valve replacement: Secondary | ICD-10-CM

## 2015-07-02 DIAGNOSIS — Z7901 Long term (current) use of anticoagulants: Secondary | ICD-10-CM

## 2015-07-02 DIAGNOSIS — Z952 Presence of prosthetic heart valve: Secondary | ICD-10-CM

## 2015-07-02 LAB — PROTIME-INR
INR: 2.6 — AB (ref 0.8–1.2)
PROTHROMBIN TIME: 26.1 s — AB (ref 9.1–12.0)

## 2015-07-08 ENCOUNTER — Other Ambulatory Visit: Payer: Self-pay | Admitting: Cardiology

## 2015-07-09 NOTE — Telephone Encounter (Signed)
Rx request sent to pharmacy.  

## 2015-08-12 ENCOUNTER — Other Ambulatory Visit: Payer: Self-pay | Admitting: Cardiology

## 2015-08-12 LAB — PROTIME-INR: INR: 3.3 — AB (ref <=1.1)

## 2015-08-13 ENCOUNTER — Ambulatory Visit (INDEPENDENT_AMBULATORY_CARE_PROVIDER_SITE_OTHER): Payer: Medicare HMO | Admitting: Pharmacist Clinician (PhC)/ Clinical Pharmacy Specialist

## 2015-08-13 DIAGNOSIS — Z952 Presence of prosthetic heart valve: Secondary | ICD-10-CM

## 2015-08-13 DIAGNOSIS — Z954 Presence of other heart-valve replacement: Secondary | ICD-10-CM

## 2015-08-13 DIAGNOSIS — Z7901 Long term (current) use of anticoagulants: Secondary | ICD-10-CM

## 2015-08-13 LAB — PROTIME-INR
INR: 3.3 — AB (ref 0.8–1.2)
Prothrombin Time: 33.4 s — ABNORMAL HIGH (ref 9.1–12.0)

## 2015-09-09 ENCOUNTER — Other Ambulatory Visit: Payer: Self-pay | Admitting: Cardiology

## 2015-09-09 LAB — PROTIME-INR: INR: 4.2 — AB (ref <=1.1)

## 2015-09-10 LAB — PROTIME-INR
INR: 4.2 — ABNORMAL HIGH (ref 0.8–1.2)
PROTHROMBIN TIME: 42.6 s — AB (ref 9.1–12.0)

## 2015-09-11 ENCOUNTER — Ambulatory Visit (INDEPENDENT_AMBULATORY_CARE_PROVIDER_SITE_OTHER): Payer: Medicare HMO | Admitting: Pharmacist Clinician (PhC)/ Clinical Pharmacy Specialist

## 2015-09-11 DIAGNOSIS — Z954 Presence of other heart-valve replacement: Secondary | ICD-10-CM

## 2015-09-11 DIAGNOSIS — Z7901 Long term (current) use of anticoagulants: Secondary | ICD-10-CM

## 2015-09-11 DIAGNOSIS — Z952 Presence of prosthetic heart valve: Secondary | ICD-10-CM

## 2015-10-06 ENCOUNTER — Other Ambulatory Visit: Payer: Self-pay | Admitting: Cardiology

## 2015-10-06 LAB — PROTIME-INR: INR: 3.3 — AB (ref <=1.1)

## 2015-10-07 ENCOUNTER — Ambulatory Visit (INDEPENDENT_AMBULATORY_CARE_PROVIDER_SITE_OTHER): Payer: Medicare HMO | Admitting: Pharmacist

## 2015-10-07 DIAGNOSIS — Z7901 Long term (current) use of anticoagulants: Secondary | ICD-10-CM

## 2015-10-07 DIAGNOSIS — Z954 Presence of other heart-valve replacement: Secondary | ICD-10-CM

## 2015-10-07 DIAGNOSIS — Z952 Presence of prosthetic heart valve: Secondary | ICD-10-CM

## 2015-10-07 LAB — PROTIME-INR
INR: 3.3 — AB (ref 0.8–1.2)
Prothrombin Time: 33.7 s — ABNORMAL HIGH (ref 9.1–12.0)

## 2015-11-04 ENCOUNTER — Other Ambulatory Visit: Payer: Self-pay | Admitting: Cardiology

## 2015-11-04 LAB — PROTIME-INR: INR: 3.3 — AB (ref 0.9–1.1)

## 2015-11-05 ENCOUNTER — Ambulatory Visit (INDEPENDENT_AMBULATORY_CARE_PROVIDER_SITE_OTHER): Payer: Medicare HMO | Admitting: Pharmacist

## 2015-11-05 DIAGNOSIS — Z954 Presence of other heart-valve replacement: Secondary | ICD-10-CM

## 2015-11-05 DIAGNOSIS — Z7901 Long term (current) use of anticoagulants: Secondary | ICD-10-CM

## 2015-11-05 DIAGNOSIS — Z952 Presence of prosthetic heart valve: Secondary | ICD-10-CM

## 2015-11-05 LAB — PROTIME-INR
INR: 3.3 — AB (ref 0.8–1.2)
PROTHROMBIN TIME: 33.3 s — AB (ref 9.1–12.0)

## 2015-12-10 ENCOUNTER — Other Ambulatory Visit: Payer: Self-pay | Admitting: Cardiology

## 2015-12-10 LAB — PROTIME-INR: INR: 2.1 — AB (ref <=1.1)

## 2015-12-11 ENCOUNTER — Ambulatory Visit (INDEPENDENT_AMBULATORY_CARE_PROVIDER_SITE_OTHER): Payer: Medicare HMO | Admitting: Pharmacist

## 2015-12-11 DIAGNOSIS — Z7901 Long term (current) use of anticoagulants: Secondary | ICD-10-CM

## 2015-12-11 DIAGNOSIS — Z952 Presence of prosthetic heart valve: Secondary | ICD-10-CM

## 2015-12-11 DIAGNOSIS — Z954 Presence of other heart-valve replacement: Secondary | ICD-10-CM

## 2015-12-11 LAB — PROTIME-INR
INR: 2.1 — AB (ref 0.8–1.2)
Prothrombin Time: 21.5 s — ABNORMAL HIGH (ref 9.1–12.0)

## 2016-01-06 ENCOUNTER — Other Ambulatory Visit: Payer: Self-pay | Admitting: Cardiology

## 2016-01-06 LAB — PROTIME-INR: INR: 3 — AB (ref 0.9–1.1)

## 2016-01-07 ENCOUNTER — Ambulatory Visit (INDEPENDENT_AMBULATORY_CARE_PROVIDER_SITE_OTHER): Payer: Medicare HMO | Admitting: Pharmacist

## 2016-01-07 DIAGNOSIS — Z954 Presence of other heart-valve replacement: Secondary | ICD-10-CM

## 2016-01-07 DIAGNOSIS — Z7901 Long term (current) use of anticoagulants: Secondary | ICD-10-CM

## 2016-01-07 DIAGNOSIS — Z952 Presence of prosthetic heart valve: Secondary | ICD-10-CM

## 2016-01-07 LAB — PROTIME-INR
INR: 3 — AB (ref 0.8–1.2)
Prothrombin Time: 29.4 s — ABNORMAL HIGH (ref 9.1–12.0)

## 2016-02-01 ENCOUNTER — Other Ambulatory Visit: Payer: Self-pay | Admitting: Cardiology

## 2016-02-09 ENCOUNTER — Other Ambulatory Visit: Payer: Self-pay | Admitting: Cardiology

## 2016-02-09 LAB — POCT INR: INR: 3

## 2016-02-10 ENCOUNTER — Ambulatory Visit (INDEPENDENT_AMBULATORY_CARE_PROVIDER_SITE_OTHER): Payer: Medicare HMO | Admitting: Pharmacist

## 2016-02-10 DIAGNOSIS — Z952 Presence of prosthetic heart valve: Secondary | ICD-10-CM

## 2016-02-10 LAB — PROTIME-INR
INR: 3 — ABNORMAL HIGH (ref 0.8–1.2)
PROTHROMBIN TIME: 29.4 s — AB (ref 9.1–12.0)

## 2016-02-26 ENCOUNTER — Telehealth: Payer: Self-pay | Admitting: Cardiology

## 2016-02-26 ENCOUNTER — Encounter: Payer: Self-pay | Admitting: Cardiology

## 2016-02-26 NOTE — Telephone Encounter (Signed)
Routing to pharmacy. 

## 2016-02-26 NOTE — Telephone Encounter (Signed)
New Message  Pt call requesting to speak with RN. Pt states he was prescribed Cipro by PCP and was instructed to call cardiologist to change dosage on warfarin. Please call back to discuss

## 2016-02-26 NOTE — Telephone Encounter (Signed)
Patient started on cipro today, concerned for interaction with warfarin.  Advised patient to take cipro, but go to lab Monday morning for repeat INR.  Patient voiced understanding.

## 2016-02-29 ENCOUNTER — Other Ambulatory Visit: Payer: Self-pay | Admitting: Cardiology

## 2016-02-29 LAB — PROTIME-INR: INR: 2.6 — AB (ref <=1.1)

## 2016-03-01 ENCOUNTER — Ambulatory Visit (INDEPENDENT_AMBULATORY_CARE_PROVIDER_SITE_OTHER): Payer: Medicare HMO | Admitting: Pharmacist

## 2016-03-01 DIAGNOSIS — Z952 Presence of prosthetic heart valve: Secondary | ICD-10-CM

## 2016-03-01 LAB — PROTIME-INR
INR: 2.6 — AB (ref 0.8–1.2)
PROTHROMBIN TIME: 25.6 s — AB (ref 9.1–12.0)

## 2016-03-14 ENCOUNTER — Telehealth: Payer: Self-pay | Admitting: Cardiology

## 2016-03-14 NOTE — Telephone Encounter (Signed)
Received records from [facility] for appointment on 03/21/16 with Dr Ellyn Hack.  Records given to [facility] (medical records) for Dr Allison Quarry schedule on 03/21/16. lp

## 2016-03-15 ENCOUNTER — Encounter: Payer: Self-pay | Admitting: Cardiology

## 2016-03-21 ENCOUNTER — Encounter: Payer: Self-pay | Admitting: Cardiology

## 2016-03-21 ENCOUNTER — Ambulatory Visit (INDEPENDENT_AMBULATORY_CARE_PROVIDER_SITE_OTHER): Payer: Medicare HMO | Admitting: Cardiology

## 2016-03-21 VITALS — BP 134/86 | HR 69 | Ht 71.75 in | Wt 216.8 lb

## 2016-03-21 DIAGNOSIS — Z951 Presence of aortocoronary bypass graft: Secondary | ICD-10-CM | POA: Diagnosis not present

## 2016-03-21 DIAGNOSIS — E785 Hyperlipidemia, unspecified: Secondary | ICD-10-CM | POA: Diagnosis not present

## 2016-03-21 DIAGNOSIS — I10 Essential (primary) hypertension: Secondary | ICD-10-CM

## 2016-03-21 DIAGNOSIS — Z952 Presence of prosthetic heart valve: Secondary | ICD-10-CM | POA: Diagnosis not present

## 2016-03-21 DIAGNOSIS — E669 Obesity, unspecified: Secondary | ICD-10-CM

## 2016-03-21 DIAGNOSIS — I2510 Atherosclerotic heart disease of native coronary artery without angina pectoris: Secondary | ICD-10-CM

## 2016-03-21 DIAGNOSIS — Z7901 Long term (current) use of anticoagulants: Secondary | ICD-10-CM

## 2016-03-21 NOTE — Patient Instructions (Signed)
SCHEDULE FOR NOV 2018  Your physician has requested that you have en exercise stress myoview. For further information please visit [email or web address]. Please follow instruction sheet, as given.    NO CHANGE WITH CURRENT MEDICATIONS   Your physician wants you to follow-up in: [place]. You will receive a reminder letter in the mail two months in advance. If you don't receive a letter, please call our office to schedule the follow-up appointment.  If you need a refill on your cardiac medications before your next appointment, please call your pharmacy.

## 2016-03-21 NOTE — Progress Notes (Signed)
PCP: Olin Hauser, MD  Clinic Note: Chief Complaint  Patient presents with  . Follow-up    CAD-CABG, AVR    HPI: Erik Meyer is a 75 y.o. male with a PMH below who presents today for annual follow-up of CAD-CABG with aVR. In the past he been very active exercising at the [facility] at least 4-5 days a week doing vigorous water aerobics. Limited 1 risk is his chronic back pain.[name] was last seen in November 2016  Recent Hospitalizations: None  Studies Reviewed: None  Interval History: Cameran continues to be doing very well with no major complaints. He continues to stay active with his routine exercise. About the only thing he noted being abnormal over the last year was that he just had a UTI cleared with antibiotics. Otherwise he is doing quite well with no major complaints.  Cardiac review of symptoms as follows: No chest pain or shortness of breath with rest or exertion.  No PND, orthopnea or edema. No palpitations, lightheadedness, dizziness, weakness or syncope/near syncope. No TIA/amaurosis fugax symptoms. No melena, hematochezia, hematuria, or epstaxis. No claudication.  ROS: A comprehensive was performed. Review of Systems  Constitutional: Negative for chills, fever, malaise/fatigue and weight loss.  HENT: Negative for congestion, hearing loss and nosebleeds.   Eyes: Negative for blurred vision.  Respiratory: Negative for cough, shortness of breath and wheezing.   Cardiovascular: Negative.   Gastrointestinal: Negative for blood in stool, melena and nausea.  Genitourinary: Negative for hematuria.  Musculoskeletal: Positive for back pain (Chronic). Negative for myalgias.  Neurological: Negative for dizziness and loss of consciousness.  Endo/Heme/Allergies: Negative for environmental allergies.  Psychiatric/Behavioral: Negative for depression and memory loss. The patient is not nervous/anxious and does not have insomnia.      Past Medical History:    Diagnosis Date  . Anemia   . CAD, multiple vessel 2004   Found during preop cath for [facility] AVR procedure; Myoview 5/'13: No ischemia or infarct, EF 53%  . COPD (chronic obstructive pulmonary disease) ([place])   . Diverticulosis   . Dyslipidemia, goal LDL below 70    Monitored by PCP. On statin (last labs scan from September 2015: TC 149, TG 127, HDL 55, LDL 69)  . ED (erectile dysfunction)   . GERD (gastroesophageal reflux disease)   . History of SBO (small bowel obstruction) March 2015   Abdominal surgery with LOA  . Hx of irritable bowel syndrome   . Hypertension, essential   . Lesion of right native kidney  08/20/2013   Abdominal US : 2.4 cm x 1.7 complex lesion in the upper pole the right kidney in March of 2012 concerning for RCC although enlarging hemorrhagic cyst could have this appearance as well.,   . Monoclonal gammopathy of undetermined significance   . Psoriasis   . S/P AVR (aortic valve replacement) and aortoplasty 2004   Dr. Cyndia BentDeneen Harts procedure - St. Jude aVR (25 mm prosthesis) with aortic root conduit;; Echo 10/'14: EF 55-60%, Mod Conc LVH, Gr 1 DD, Well Seated AoV Mech Prosthesis with normal P gradients (no stenosis), Mod-Severe LA dilation --> follow-up echo November 2016: Well functioning mechanical valve. Paradoxical septal motion. EF 50-55%. Severe LA dilation. Moderate RA dilation.   . S/P CABG x 4 2004   LIMA-LAD, SVG-D1, SVG- PDA-PLA (along with AVR)    Past Surgical History:  Procedure Laterality Date  . ABDOMINAL AORTIC ANEURYSM REPAIR    . AORTIC VALVE REPLACEMENT    . ASD  REPAIR  12/26/2002   Aortic valve replacement and replacement of aortic root aneurysm using a 25 mm St. JUDE mechanical valve cinduit with reimplantation of the coronary arteeries  . CARDIAC CATHETERIZATION  Aug  13,2004   diffuse  coronary disease,prior to his BENTALL PROCEDURE  . CORONARY ARTERY BYPASS GRAFT  12/26/2002   LIMA to LAD,SVG to diagonal, SVG to PDA and PLA.   Marland Kitchen  LAPAROSCOPIC LYSIS OF ADHESIONS  March 2015   performed in [place] for SBO   . NM MYOVIEW LTD  10/04/2011   showed no ischemia;EF52%  . PARTIAL COLECTOMY  1994  . TRANSTHORACIC ECHOCARDIOGRAM  October 2013   CW doppler gradient of 2.42m/ second across the aortic valve with a mean  gradient of 12 ,which was normal for valve, normal RSVP ANDSYSTOLIC FUNCTION WIYH DIASTOLIC RELAXATION ABNORMALITY  . TRANSTHORACIC ECHOCARDIOGRAM  October 2014   EF 55-60%. Mild concentric LVH;  Grade 1 Diastolic Dysfunction.; St. Jude familiar mechanical prosthesis well seated. No evidence of stenosis. Gradients normal for  prosthesis; moderate to severely dilated left atrium.   . TRANSTHORACIC ECHOCARDIOGRAM  2014; 03/2015   Echo 10/'14: EF 55-60%, Mod Conc LVH, Gr 1 DD, Well Seated AoV Mech Prosthesis with normal P gradients (no stenosis), Mod-Severe LA dilation --> follow-up echo November 2016: Well functioning mechanical valve. Paradoxical septal motion. EF 50-55%. Severe LA dilation. Moderate RA dilation.   . VENTRAL HERNIA REPAIR     repair with mesh by Dr Collene Mares at [place] Start Date End Date Taking? Authorizing Provider  Aspirin 81 mg Take 1 Tablet daily      amLODipine (NORVASC) 10 MG tablet TAKE 1 TABLET EVERY DAY 02/02/16  Yes Leonie Man, MD  ampicillin (PRINCIPEN) 500 MG capsule Take 500 mg by mouth daily.  09/04/13  Yes Historical Provider, MD  aspirin 81 MG tablet Take 81 mg by mouth daily.   Yes Historical Provider, MD  DENTA 5000 PLUS 1.1 % CREA dental cream Place 1 Squirt onto teeth daily. 06/19/14  Yes Historical Provider, MD  esomeprazole (NEXIUM) 20 MG capsule Take 20 mg by mouth daily. 3 times a week M, W, F   Yes Historical Provider, MD  Follow-up IRON PO Take 325 mg by mouth 2 (two) times daily.    Yes Historical Provider, MD  metoprolol succinate (TOPROL-XL) 50 MG 24 hr tablet TAKE 1 TABLET DAILY WITH OR IMMEDIATELY FOLLOWING A MEAL. 02/02/16  Yes  Leonie Man, MD  simvastatin (ZOCOR) 20 MG tablet TAKE 1 TABLET AT BEDTIME 02/02/16  Yes Leonie Man, MD  tamsulosin [facility]) 0.4 MG CAPS capsule Take 0.4 mg by mouth daily.   Yes Historical Provider, MD  valsartan-hydrochlorothiazide (DIOVAN-HCT) 320-12.5 MG tablet TAKE 1 TABLET EVERY DAY 02/02/16  Yes Leonie Man, MD  warfarin (COUMADIN) 5 MG tablet TAKE 2 TABLETS DAILY OR AS DIRECTED BY COUMADIN CLINIC 07/09/15  Yes Leonie Man, MD    Allergies  Allergen Reactions  . Doxycycline Rash    Social History   Social History  . Marital status: Married    Spouse name: N/A  . Number of children: 0  . Years of education: N/A   Occupational History  .      Retired   Social History Main Topics  . Smoking status: Former Smoker    Quit date: 06/16/1997  . Smokeless tobacco: Never Used  . Alcohol use Yes  . Drug use: No  .  Sexual activity: Not Asked   Other Topics Concern  . None   Social History Narrative   Married gentleman with no children. Quit smoking in 1998.   Education: 2 years of college. He does drink caffeine.  He drinks social beer.   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 Claiborne Billings in [place]; GI: Dr. Janus Molder in [place]       Family History  Problem Relation Age of Onset  . Colon cancer Mother   . Congestive Heart Failure Mother     Cardiomyopathy  . Lung cancer Father     Was a chronic smoker  . COPD Sister     End-stage COPD     Wt Readings from Last 3 Encounters:  03/21/16 98.3 kg (216 lb 12.8 oz)  03/30/15 97.4 kg (214 lb 11.2 oz)  09/11/14 98.2 kg (216 lb 6.4 oz)    PHYSICAL EXAM BP 134/86   Pulse 69   Ht 5' 11.75" (1.822 m)   Wt 98.3 kg (216 lb 12.8 oz)   BMI 29.61 kg/m  General appearance: alert, cooperative, appears stated age, no distress and Healthy-appearing,  well-nourished well-groomed. He answers questions appropriately. Neck: no carotid bruit, no JVD, supple, symmetrical, trachea midline and radiating aortic murmur Lungs: clear to auscultation bilaterally, normal percussion bilaterally and Nonlabored, good movement Heart: RRR with normal S1 with mild mechanical S2. Slightly lateral PMI with this soft S4. 2/6 musical SEM LUSB radiating to the carotids as well as base. No carotid delay.; No other M./R./G.  Abdomen: soft, non-tender; bowel sounds normal; no masses, no organomegaly; well-healing surgical scars. A Extremities: Trace edema on the right lower extremity with minimal left'; mild varicosities; venous stasis dermatitis changes on the right leg Pulses: 2+ and symmetric Neurologic: Alert and oriented X 3, normal strength and tone.     Adult ECG Report  Rate: 69 ;  Rhythm: normal sinus rhythm and 1 AVB (216). Otherwise normal axis, intervals and durations;   Narrative Interpretation: Stable EKG (last EKG had PR interval of 204)   Other studies Reviewed: Additional studies/ records that were reviewed today include:  Recent Labs: 01/20/2016 from PCP  Na+ 136, K+ 4.1, Cl- 94, HCO3- 24 , BUN 11, Cr 0.86, Glu 100, Ca2+ 9.1; AST 14, ALT 19, AlkP 77, Alb 4.3, TP 6.5, T Bili 0.7  CBC: W 2.8, H/H 13.9/30.7. Plt 134; A1c 5.5  TC 176, TG 224, HDL 56, LDL 75   ASSESSMENT / PLAN: Problem List Items Addressed This Visit    S/P AVR (aortic valve replacement) and aortoplasty (Chronic)   CAD, multiple vessel - Primary (Chronic)    Was relatively asymptomatic from an angina standpoint. CAD was found during evaluation for aortic stenosis. He was noted to have multivessel disease. Now status post CABG in 2004. Last Myoview 2013 negative for ischemia. Remains active with no recurrent angina symptoms or heart failure symptoms.  Plan: continue beta blocker, aspirin, statin, ARB as well as calcium channel blocker.  He is due for follow-up Myoview  (has been for years, would like to do before 5 years out) - we will order for just before his follow-up visit next year.       Relevant Orders   EKG 12-Lead   Myocardial Perfusion Imaging   S/P CABG x 4 (Chronic)  Relevant Orders   EKG 12-Lead   Myocardial Perfusion Imaging   Hx of aortic valve replacement, mechanical (Chronic)    Last echo was from November 2016. It has been stable now for several years. I think we can probably order 1 next year to be done prior to the angle follow-up in 2019. Valve continues to stay on stable exam. He is on warfarin lifelong for mechanical valve with INR followed by our pharmacy team. He also needs to use prophylactic antibiotics for GI and dental procedures.      Relevant Orders   EKG 12-Lead   Myocardial Perfusion Imaging   Dyslipidemia, goal LDL below 70 (Chronic)    On simvastatin at 20 mg based on her being on amlodipine. Labs followed by PCP.      Relevant Orders   EKG 12-Lead   Myocardial Perfusion Imaging   Hypertension, essential (Chronic)    Well-controlled on current medications. Continue current medications      Relevant Orders   EKG 12-Lead   Myocardial Perfusion Imaging   Obesity (BMI 30-39.9) (Chronic)    No longer technically meets obesity criteria, however is borderline. Continue to discuss the importance of dietary modification and improved exercise. He is really limited mostly by back pain in this regard.      Relevant Orders   Myocardial Perfusion Imaging   Long term current use of anticoagulant therapy (Chronic)    On long-term warfarin. No bleeding issues.      Relevant Orders   Myocardial Perfusion Imaging      Current medicines are reviewed at length with the patient today. (+/- concerns) n/a The following changes have been made:   Patient Instructions  SCHEDULE FOR NOV 2018  Your physician has requested that you have en exercise stress myoview. For further information please visit  [email or web address]. Please follow instruction sheet, as given.    NO CHANGE WITH CURRENT MEDICATIONS   Your physician wants you to follow-up in: [place]. You will receive a reminder letter in the mail two months in advance. If you don't receive a letter, please call our office to schedule the follow-up appointment.  If you need a refill on your cardiac medications before your next appointment, please call your pharmacy.    Studies Ordered:   Orders Placed This Encounter  Procedures  . Myocardial Perfusion Imaging  . EKG 12-Lead      Glenetta Hew, M.D., M.S. Interventional Cardiologist   Pager # [phone] Phone # [phone] [address] [place], [place] [address]

## 2016-03-22 ENCOUNTER — Encounter: Payer: Self-pay | Admitting: Cardiology

## 2016-03-22 NOTE — Assessment & Plan Note (Signed)
On simvastatin at 20 mg based on her being on amlodipine. Labs followed by PCP.

## 2016-03-22 NOTE — Assessment & Plan Note (Signed)
Well-controlled on current medications. Continue current medications

## 2016-03-22 NOTE — Assessment & Plan Note (Signed)
Was relatively asymptomatic from an angina standpoint. CAD was found during evaluation for aortic stenosis. He was noted to have multivessel disease. Now status post CABG in 2004. Last Myoview 2013 negative for ischemia. Remains active with no recurrent angina symptoms or heart failure symptoms.  Plan: continue beta blocker, aspirin, statin, ARB as well as calcium channel blocker.  He is due for follow-up Myoview (has been for years, would like to do before 5 years out) - we will order for just before his follow-up visit next year.

## 2016-03-22 NOTE — Assessment & Plan Note (Addendum)
Last echo was from November 2016. It has been stable now for several years. I think we can probably order 1 next year to be done prior to the angle follow-up in 2019. Valve continues to stay on stable exam. He is on warfarin lifelong for mechanical valve with INR followed by our pharmacy team. He also needs to use prophylactic antibiotics for GI and dental procedures.

## 2016-03-22 NOTE — Assessment & Plan Note (Signed)
No longer technically meets obesity criteria, however is borderline. Continue to discuss the importance of dietary modification and improved exercise. He is really limited mostly by back pain in this regard.

## 2016-03-22 NOTE — Assessment & Plan Note (Signed)
On long-term warfarin. No bleeding issues.

## 2016-03-29 ENCOUNTER — Other Ambulatory Visit: Payer: Self-pay | Admitting: Cardiology

## 2016-03-29 LAB — POCT INR: INR: 3.3

## 2016-03-30 ENCOUNTER — Ambulatory Visit (INDEPENDENT_AMBULATORY_CARE_PROVIDER_SITE_OTHER): Payer: Medicare HMO | Admitting: Pharmacist

## 2016-03-30 DIAGNOSIS — Z952 Presence of prosthetic heart valve: Secondary | ICD-10-CM

## 2016-03-30 DIAGNOSIS — Z7901 Long term (current) use of anticoagulants: Secondary | ICD-10-CM

## 2016-03-30 LAB — PROTIME-INR
INR: 3.3 — ABNORMAL HIGH (ref 0.8–1.2)
Prothrombin Time: 32.2 s — ABNORMAL HIGH (ref 9.1–12.0)

## 2016-05-04 ENCOUNTER — Other Ambulatory Visit: Payer: Self-pay | Admitting: Cardiology

## 2016-05-04 LAB — POCT INR: INR: 3.2

## 2016-05-05 ENCOUNTER — Ambulatory Visit (INDEPENDENT_AMBULATORY_CARE_PROVIDER_SITE_OTHER): Payer: Medicare HMO | Admitting: Pharmacist

## 2016-05-05 DIAGNOSIS — Z952 Presence of prosthetic heart valve: Secondary | ICD-10-CM

## 2016-05-05 DIAGNOSIS — Z7901 Long term (current) use of anticoagulants: Secondary | ICD-10-CM

## 2016-05-05 LAB — PROTIME-INR
INR: 3.2 — AB (ref 0.8–1.2)
Prothrombin Time: 31 s — ABNORMAL HIGH (ref 9.1–12.0)

## 2016-06-14 ENCOUNTER — Other Ambulatory Visit: Payer: Self-pay | Admitting: Cardiology

## 2016-06-15 ENCOUNTER — Ambulatory Visit (INDEPENDENT_AMBULATORY_CARE_PROVIDER_SITE_OTHER): Payer: Medicare HMO | Admitting: Pharmacist

## 2016-06-15 DIAGNOSIS — Z7901 Long term (current) use of anticoagulants: Secondary | ICD-10-CM

## 2016-06-15 DIAGNOSIS — Z952 Presence of prosthetic heart valve: Secondary | ICD-10-CM

## 2016-06-15 LAB — PROTIME-INR
INR: 3.4 — AB (ref 0.8–1.2)
Prothrombin Time: 33.2 s — ABNORMAL HIGH (ref 9.1–12.0)

## 2016-07-08 ENCOUNTER — Other Ambulatory Visit: Payer: Self-pay | Admitting: Cardiology

## 2016-07-08 LAB — PROTIME-INR: INR: 2.8 — AB (ref <=1.1)

## 2016-07-09 LAB — PROTIME-INR
INR: 2.8 — AB (ref 0.8–1.2)
Prothrombin Time: 27.4 s — ABNORMAL HIGH (ref 9.1–12.0)

## 2016-07-11 ENCOUNTER — Ambulatory Visit (INDEPENDENT_AMBULATORY_CARE_PROVIDER_SITE_OTHER): Payer: Medicare HMO | Admitting: Pharmacist

## 2016-07-11 DIAGNOSIS — Z952 Presence of prosthetic heart valve: Secondary | ICD-10-CM

## 2016-07-11 DIAGNOSIS — Z7901 Long term (current) use of anticoagulants: Secondary | ICD-10-CM

## 2016-08-08 ENCOUNTER — Other Ambulatory Visit: Payer: Self-pay | Admitting: Cardiology

## 2016-08-08 NOTE — Telephone Encounter (Signed)
Rx request sent to pharmacy.  

## 2016-08-10 ENCOUNTER — Other Ambulatory Visit: Payer: Self-pay | Admitting: Cardiology

## 2016-08-11 ENCOUNTER — Ambulatory Visit (INDEPENDENT_AMBULATORY_CARE_PROVIDER_SITE_OTHER): Payer: Medicare HMO | Admitting: Pharmacist

## 2016-08-11 DIAGNOSIS — Z7901 Long term (current) use of anticoagulants: Secondary | ICD-10-CM

## 2016-08-11 DIAGNOSIS — Z952 Presence of prosthetic heart valve: Secondary | ICD-10-CM

## 2016-08-11 LAB — PROTIME-INR
INR: 3 — AB (ref <=1.1)
INR: 3 — ABNORMAL HIGH (ref 0.8–1.2)
PROTHROMBIN TIME: 29.8 s — AB (ref 9.1–12.0)

## 2016-09-05 ENCOUNTER — Other Ambulatory Visit: Payer: Self-pay | Admitting: Cardiology

## 2016-09-06 ENCOUNTER — Ambulatory Visit (INDEPENDENT_AMBULATORY_CARE_PROVIDER_SITE_OTHER): Payer: Medicare HMO | Admitting: Pharmacist Clinician (PhC)/ Clinical Pharmacy Specialist

## 2016-09-06 DIAGNOSIS — Z7901 Long term (current) use of anticoagulants: Secondary | ICD-10-CM

## 2016-09-06 DIAGNOSIS — Z952 Presence of prosthetic heart valve: Secondary | ICD-10-CM

## 2016-09-06 LAB — PROTIME-INR
INR: 4.1 — ABNORMAL HIGH (ref 0.8–1.2)
Prothrombin Time: 39.8 s — ABNORMAL HIGH (ref 9.1–12.0)

## 2016-09-12 ENCOUNTER — Other Ambulatory Visit: Payer: Self-pay | Admitting: Cardiology

## 2016-09-21 ENCOUNTER — Other Ambulatory Visit: Payer: Self-pay | Admitting: Cardiology

## 2016-09-21 LAB — PROTIME-INR: INR: 3.3 — AB (ref <=1.1)

## 2016-09-22 ENCOUNTER — Ambulatory Visit (INDEPENDENT_AMBULATORY_CARE_PROVIDER_SITE_OTHER): Payer: Medicare HMO | Admitting: Pharmacist

## 2016-09-22 DIAGNOSIS — Z7901 Long term (current) use of anticoagulants: Secondary | ICD-10-CM

## 2016-09-22 DIAGNOSIS — Z952 Presence of prosthetic heart valve: Secondary | ICD-10-CM

## 2016-09-22 LAB — PROTIME-INR
INR: 3.3 — ABNORMAL HIGH (ref 0.8–1.2)
Prothrombin Time: 32.3 s — ABNORMAL HIGH (ref 9.1–12.0)

## 2016-10-25 ENCOUNTER — Encounter: Payer: Self-pay | Admitting: Cardiology

## 2016-10-26 ENCOUNTER — Other Ambulatory Visit: Payer: Self-pay | Admitting: Cardiology

## 2016-10-26 LAB — PROTIME-INR: INR: 2.7 — AB (ref 0.9–1.1)

## 2016-10-27 ENCOUNTER — Encounter: Payer: Self-pay | Admitting: Cardiology

## 2016-10-27 ENCOUNTER — Ambulatory Visit (INDEPENDENT_AMBULATORY_CARE_PROVIDER_SITE_OTHER): Payer: Medicare HMO | Admitting: Pharmacist Clinician (PhC)/ Clinical Pharmacy Specialist

## 2016-10-27 DIAGNOSIS — Z7901 Long term (current) use of anticoagulants: Secondary | ICD-10-CM

## 2016-10-27 DIAGNOSIS — Z952 Presence of prosthetic heart valve: Secondary | ICD-10-CM

## 2016-10-27 LAB — PROTIME-INR
INR: 2.7 — ABNORMAL HIGH (ref 0.8–1.2)
Prothrombin Time: 26.4 s — ABNORMAL HIGH (ref 9.1–12.0)

## 2016-10-28 ENCOUNTER — Telehealth: Payer: Self-pay | Admitting: Cardiology

## 2016-10-28 NOTE — Telephone Encounter (Signed)
Pt wants to know if it is alright for him to take Celebrex with his other medicine?

## 2016-10-28 NOTE — Telephone Encounter (Signed)
Routed to pharmD to advise. Pt seen yesterday for coumadin visit.

## 2016-10-28 NOTE — Telephone Encounter (Signed)
Returned call - explained increased risk of thrombotic events with Celebrex.  Advised that he take it more on as needed basis.  Preferably 2-3 days then stop for some time and repeat as necessary.  Would prefer that he only need to use 1-2 times per month at most, but by the same token, we don't want him to be in pain and miserable.   Patient voiced understanding.

## 2016-11-21 ENCOUNTER — Other Ambulatory Visit: Payer: Self-pay | Admitting: Cardiology

## 2016-11-22 ENCOUNTER — Ambulatory Visit (INDEPENDENT_AMBULATORY_CARE_PROVIDER_SITE_OTHER): Payer: Medicare HMO | Admitting: Pharmacist Clinician (PhC)/ Clinical Pharmacy Specialist

## 2016-11-22 DIAGNOSIS — Z7901 Long term (current) use of anticoagulants: Secondary | ICD-10-CM | POA: Diagnosis not present

## 2016-11-22 DIAGNOSIS — Z952 Presence of prosthetic heart valve: Secondary | ICD-10-CM | POA: Diagnosis not present

## 2016-11-22 LAB — PROTIME-INR
INR: 3.1 — AB (ref 0.8–1.2)
Prothrombin Time: 30.1 s — ABNORMAL HIGH (ref 9.1–12.0)

## 2016-12-14 ENCOUNTER — Other Ambulatory Visit: Payer: Self-pay | Admitting: Cardiology

## 2016-12-26 ENCOUNTER — Other Ambulatory Visit: Payer: Self-pay | Admitting: Cardiology

## 2016-12-27 ENCOUNTER — Ambulatory Visit (INDEPENDENT_AMBULATORY_CARE_PROVIDER_SITE_OTHER): Payer: Medicare HMO | Admitting: Pharmacist

## 2016-12-27 DIAGNOSIS — Z952 Presence of prosthetic heart valve: Secondary | ICD-10-CM

## 2016-12-27 DIAGNOSIS — Z7901 Long term (current) use of anticoagulants: Secondary | ICD-10-CM

## 2016-12-27 LAB — PROTIME-INR
INR: 2.7 — AB (ref 0.8–1.2)
Prothrombin Time: 26.5 s — ABNORMAL HIGH (ref 9.1–12.0)

## 2017-01-05 ENCOUNTER — Telehealth: Payer: Self-pay | Admitting: Pharmacist Clinician (PhC)/ Clinical Pharmacy Specialist

## 2017-01-05 NOTE — Telephone Encounter (Signed)
Coumadin letter 

## 2017-01-27 ENCOUNTER — Other Ambulatory Visit: Payer: Self-pay | Admitting: Cardiology

## 2017-01-27 LAB — PROTIME-INR: INR: 3.2 — AB (ref 0.9–1.1)

## 2017-01-28 LAB — PROTIME-INR
INR: 3.2 — ABNORMAL HIGH (ref 0.8–1.2)
PROTHROMBIN TIME: 30.7 s — AB (ref 9.1–12.0)

## 2017-01-30 ENCOUNTER — Ambulatory Visit (INDEPENDENT_AMBULATORY_CARE_PROVIDER_SITE_OTHER): Payer: Medicare HMO | Admitting: Pharmacist Clinician (PhC)/ Clinical Pharmacy Specialist

## 2017-01-30 DIAGNOSIS — Z952 Presence of prosthetic heart valve: Secondary | ICD-10-CM | POA: Diagnosis not present

## 2017-01-30 DIAGNOSIS — Z7901 Long term (current) use of anticoagulants: Secondary | ICD-10-CM | POA: Diagnosis not present

## 2017-02-06 HISTORY — PX: TRANSTHORACIC ECHOCARDIOGRAM: SHX275

## 2017-03-02 ENCOUNTER — Telehealth: Payer: Self-pay | Admitting: Cardiology

## 2017-03-02 ENCOUNTER — Encounter: Payer: Self-pay | Admitting: Cardiology

## 2017-03-02 ENCOUNTER — Other Ambulatory Visit: Payer: Self-pay | Admitting: Cardiology

## 2017-03-02 LAB — PROTIME-INR: INR: 3.9 — AB (ref <=1.1)

## 2017-03-02 NOTE — Telephone Encounter (Signed)
Pt said he sent a message to Dr Ellyn Hack through my chart. He wants to make sure Dr Ellyn Hack gets it please.

## 2017-03-02 NOTE — Telephone Encounter (Signed)
Returned call to patient to follow up on MyChart message + call. He states his hematologist/oncologist from [place] Dr. Georgiann Cocker did not thing his "issue" was blood related. He did not clarify what this was in reference to. Will route to MD/RN

## 2017-03-03 ENCOUNTER — Encounter: Payer: Self-pay | Admitting: Cardiology

## 2017-03-03 ENCOUNTER — Ambulatory Visit (INDEPENDENT_AMBULATORY_CARE_PROVIDER_SITE_OTHER): Payer: Medicare HMO | Admitting: Pharmacist Clinician (PhC)/ Clinical Pharmacy Specialist

## 2017-03-03 DIAGNOSIS — Z952 Presence of prosthetic heart valve: Secondary | ICD-10-CM

## 2017-03-03 DIAGNOSIS — Z7901 Long term (current) use of anticoagulants: Secondary | ICD-10-CM | POA: Diagnosis not present

## 2017-03-03 LAB — PROTIME-INR
INR: 3.9 — AB (ref 0.8–1.2)
Prothrombin Time: 37.4 s — ABNORMAL HIGH (ref 9.1–12.0)

## 2017-03-04 ENCOUNTER — Emergency Department (HOSPITAL_COMMUNITY): Payer: Medicare HMO

## 2017-03-04 ENCOUNTER — Emergency Department (HOSPITAL_COMMUNITY)
Admission: EM | Admit: 2017-03-04 | Discharge: 2017-03-04 | Disposition: A | Discharge status: Home / Self Care | Payer: Medicare HMO | Attending: Emergency Medicine | Admitting: Emergency Medicine

## 2017-03-04 ENCOUNTER — Encounter (HOSPITAL_COMMUNITY): Payer: Self-pay | Admitting: Emergency Medicine

## 2017-03-04 DIAGNOSIS — Z951 Presence of aortocoronary bypass graft: Secondary | ICD-10-CM | POA: Insufficient documentation

## 2017-03-04 DIAGNOSIS — J449 Chronic obstructive pulmonary disease, unspecified: Secondary | ICD-10-CM | POA: Insufficient documentation

## 2017-03-04 DIAGNOSIS — R079 Chest pain, unspecified: Secondary | ICD-10-CM | POA: Diagnosis present

## 2017-03-04 DIAGNOSIS — R0600 Dyspnea, unspecified: Secondary | ICD-10-CM | POA: Diagnosis not present

## 2017-03-04 DIAGNOSIS — I4891 Unspecified atrial fibrillation: Secondary | ICD-10-CM | POA: Diagnosis not present

## 2017-03-04 DIAGNOSIS — Z87891 Personal history of nicotine dependence: Secondary | ICD-10-CM | POA: Insufficient documentation

## 2017-03-04 DIAGNOSIS — I10 Essential (primary) hypertension: Secondary | ICD-10-CM | POA: Diagnosis not present

## 2017-03-04 DIAGNOSIS — I2510 Atherosclerotic heart disease of native coronary artery without angina pectoris: Secondary | ICD-10-CM | POA: Diagnosis not present

## 2017-03-04 DIAGNOSIS — Z7982 Long term (current) use of aspirin: Secondary | ICD-10-CM | POA: Insufficient documentation

## 2017-03-04 DIAGNOSIS — Z952 Presence of prosthetic heart valve: Secondary | ICD-10-CM | POA: Insufficient documentation

## 2017-03-04 DIAGNOSIS — Z79899 Other long term (current) drug therapy: Secondary | ICD-10-CM | POA: Insufficient documentation

## 2017-03-04 DIAGNOSIS — Z7901 Long term (current) use of anticoagulants: Secondary | ICD-10-CM

## 2017-03-04 DIAGNOSIS — R0602 Shortness of breath: Secondary | ICD-10-CM

## 2017-03-04 LAB — BASIC METABOLIC PANEL
ANION GAP: 10 (ref 5–15)
BUN: 12 mg/dL (ref 6–20)
CO2: 26 mmol/L (ref 22–32)
Calcium: 8.8 mg/dL — ABNORMAL LOW (ref 8.9–10.3)
Chloride: 94 mmol/L — ABNORMAL LOW (ref 101–111)
Creatinine, Ser: 1.11 mg/dL (ref 0.61–1.24)
GLUCOSE: 118 mg/dL — AB (ref 65–99)
POTASSIUM: 3.7 mmol/L (ref 3.5–5.1)
Sodium: 130 mmol/L — ABNORMAL LOW (ref 135–145)

## 2017-03-04 LAB — CBC
HEMATOCRIT: 37.4 % — AB (ref 39.0–52.0)
HEMOGLOBIN: 12.9 g/dL — AB (ref 13.0–17.0)
MCH: 34.5 pg — ABNORMAL HIGH (ref 26.0–34.0)
MCHC: 34.5 g/dL (ref 30.0–36.0)
MCV: 100 fL (ref 78.0–100.0)
Platelets: 176 10*3/uL (ref 150–400)
RBC: 3.74 MIL/uL — ABNORMAL LOW (ref 4.22–5.81)
RDW: 15.2 % (ref 11.5–15.5)
WBC: 9.1 10*3/uL (ref 4.0–10.5)

## 2017-03-04 LAB — PROTIME-INR
INR: 3.23
Prothrombin Time: 32.7 seconds — ABNORMAL HIGH (ref 11.4–15.2)

## 2017-03-04 LAB — I-STAT TROPONIN, ED: TROPONIN I, POC: 0 ng/mL (ref 0.00–0.08)

## 2017-03-04 MED ORDER — AMIODARONE HCL 200 MG PO TABS: 200.0000 mg | ORAL_TABLET | Freq: Every day | ORAL | 0 refills | Status: DC

## 2017-03-04 MED ORDER — PROPOFOL 10 MG/ML IV BOLUS
0.5000 mg/kg | Freq: Once | INTRAVENOUS | Status: DC
  Filled 2017-03-04: qty 20

## 2017-03-04 MED ORDER — PROPOFOL 10 MG/ML IV BOLUS
INTRAVENOUS | Status: AC | PRN
  Administered 2017-03-04: 50.6 mg via INTRAVENOUS

## 2017-03-04 MED ORDER — AMIODARONE HCL 200 MG PO TABS
200.0000 mg | ORAL_TABLET | Freq: Every day | ORAL | Status: DC
  Administered 2017-03-04: 200 mg via ORAL
  Filled 2017-03-04: qty 1

## 2017-03-04 NOTE — Discharge Instructions (Addendum)
Beware - amiodarone may increase your INR - your warfarin dose might need to be adjusted.

## 2017-03-04 NOTE — ED Notes (Signed)
ED Provider at bedside. 

## 2017-03-04 NOTE — ED Notes (Signed)
Respiratory en route.  

## 2017-03-04 NOTE — ED Notes (Signed)
Lab to add on PT-INR 

## 2017-03-04 NOTE — ED Triage Notes (Signed)
Pt c/o chest pain with shortness of breath and feeling weak x 1 week. Pt seen by PCP and was to increase his Toprol and given a diuretic. Pt also reports having a high INR.

## 2017-03-04 NOTE — ED Provider Notes (Signed)
[facility] EMERGENCY DEPARTMENT Provider Note   CSN: [id number] Arrival date & time: 03/04/17  1152     History   Chief Complaint Chief Complaint  Patient presents with  . Atrial Fibrillation  . Chest Pain    HPI Erik Meyer is a 76 y.o. male.  The history is provided by the patient.  He comes in with chest pain and shortness of breath. He has history of COPD, aortic valve replacement, monoclonal gammopathy of undetermined significance. For the last week, he has noted dyspnea which she assumed it may have been part of his COPD. He denies cough or fever. He is also been having some intermittent chest pain. He saw his PCP yesterday who noted that he was in atrial fibrillation and advised he increase his dose of metoprolol. He was to follow-up with his cardiologist, but told to come to the ED if he felt worse. Today, he has noted chest pain has been constant since about 9 AM, and dyspnea has gotten worse. He rates his pain at 5/10. Pain is described as sharp and all without radiation. Of note, he has not noticed any palpitations. He is anticoagulated on warfarin, and INR was 3.9 two days ago. He was supposed to skip his warfarin dose last night, but he actually did take it.  Past Medical History:  Diagnosis Date  . Anemia   . CAD, multiple vessel 2004   Found during preop cath for [facility] AVR procedure; Myoview 5/'13: No ischemia or infarct, EF 53%  . COPD (chronic obstructive pulmonary disease) ([place])   . Diverticulosis   . Dyslipidemia, goal LDL below 70    Monitored by PCP. On statin (last labs scan from September 2015: TC 149, TG 127, HDL 55, LDL 69)  . ED (erectile dysfunction)   . GERD (gastroesophageal reflux disease)   . History of SBO (small bowel obstruction) March 2015   Abdominal surgery with LOA  . Hx of irritable bowel syndrome   . Hypertension, essential   . Lesion of right native kidney  08/20/2013   Abdominal US : 2.4 cm x 1.7 complex  lesion in the upper pole the right kidney in March of 2012 concerning for RCC although enlarging hemorrhagic cyst could have this appearance as well.,   . Monoclonal gammopathy of undetermined significance   . Psoriasis   . S/P AVR (aortic valve replacement) and aortoplasty 2004   Dr. Cyndia BentDeneen Harts procedure - St. Jude aVR (25 mm prosthesis) with aortic root conduit;; Echo 10/'14: EF 55-60%, Mod Conc LVH, Gr 1 DD, Well Seated AoV Mech Prosthesis with normal P gradients (no stenosis), Mod-Severe LA dilation --> follow-up echo November 2016: Well functioning mechanical valve. Paradoxical septal motion. EF 50-55%. Severe LA dilation. Moderate RA dilation.   . S/P CABG x 4 2004   LIMA-LAD, SVG-D1, SVG- PDA-PLA (along with AVR)    Patient Active Problem List   Diagnosis Date Noted  . Obesity (BMI 30-39.9) 03/15/2014  . S/P CABG x 4   . S/P AVR (aortic valve replacement) and aortoplasty   . CAD, multiple vessel   . Dyslipidemia, goal LDL below 70   . Hypertension, essential   . Long term current use of anticoagulant therapy 08/24/2012  . Hx of aortic valve replacement, mechanical 08/24/2012    Past Surgical History:  Procedure Laterality Date  . ABDOMINAL AORTIC ANEURYSM REPAIR    . AORTIC VALVE REPLACEMENT    . ASD REPAIR  12/26/2002   Aortic valve  replacement and replacement of aortic root aneurysm using a 25 mm St. JUDE mechanical valve cinduit with reimplantation of the coronary arteeries  . CARDIAC CATHETERIZATION  Aug  13,2004   diffuse  coronary disease,prior to his BENTALL PROCEDURE  . CORONARY ARTERY BYPASS GRAFT  12/26/2002   LIMA to LAD,SVG to diagonal, SVG to PDA and PLA.   Marland Kitchen LAPAROSCOPIC LYSIS OF ADHESIONS  March 2015   performed in [place] for SBO   . NM MYOVIEW LTD  10/04/2011   showed no ischemia;EF52%  . PARTIAL COLECTOMY  1994  . TRANSTHORACIC ECHOCARDIOGRAM  October 2013   CW doppler gradient of 2.78m/ second across the aortic valve with a mean  gradient of 12  ,which was normal for valve, normal RSVP ANDSYSTOLIC FUNCTION WIYH DIASTOLIC RELAXATION ABNORMALITY  . TRANSTHORACIC ECHOCARDIOGRAM  October 2014   EF 55-60%. Mild concentric LVH;  Grade 1 Diastolic Dysfunction.; St. Jude familiar mechanical prosthesis well seated. No evidence of stenosis. Gradients normal for  prosthesis; moderate to severely dilated left atrium.   . TRANSTHORACIC ECHOCARDIOGRAM  2014; 03/2015   Echo 10/'14: EF 55-60%, Mod Conc LVH, Gr 1 DD, Well Seated AoV Mech Prosthesis with normal P gradients (no stenosis), Mod-Severe LA dilation --> follow-up echo November 2016: Well functioning mechanical valve. Paradoxical septal motion. EF 50-55%. Severe LA dilation. Moderate RA dilation.   . VENTRAL HERNIA REPAIR     repair with mesh by Dr Collene Mares at [facility] Medications    Prior to Admission medications   Medication Sig Start Date End Date Taking? Authorizing Provider  amLODipine (NORVASC) 10 MG tablet TAKE 1 TABLET EVERY DAY 09/12/16   Leonie Man, MD  ampicillin (PRINCIPEN) 500 MG capsule Take 500 mg by mouth daily.  09/04/13   [provider]  aspirin 81 MG tablet Take 81 mg by mouth daily.    [provider]  DENTA 5000 PLUS 1.1 % CREA dental cream Place 1 Squirt onto teeth daily. 06/19/14   [provider]  esomeprazole (NEXIUM) 20 MG capsule Take 20 mg by mouth daily. 3 times a week M, W, F    [provider]  IRON PO Take 325 mg by mouth 2 (two) times daily.     [provider]  metoprolol succinate (TOPROL-XL) 50 MG 24 hr tablet TAKE 1 TABLET DAILY WITH OR IMMEDIATELY FOLLOWING A MEAL. 08/08/16   Leonie Man, MD  simvastatin (ZOCOR) 20 MG tablet TAKE 1 TABLET AT BEDTIME 08/08/16   Leonie Man, MD  tamsulosin (FLOMAX) 0.4 MG CAPS capsule Take 0.4 mg by mouth daily.    [provider]  valsartan-hydrochlorothiazide (DIOVAN-HCT) 320-12.5 MG tablet TAKE 1 TABLET EVERY DAY 08/08/16   Leonie Man, MD    warfarin (COUMADIN) 5 MG tablet Take 1 and 1/2 tablets to 2 tablets daily as directed by coumadin clinic 12/14/16   Leonie Man, MD    Family History Family History  Problem Relation Age of Onset  . Colon cancer Mother   . Congestive Heart Failure Mother        Cardiomyopathy  . Lung cancer Father        Was a chronic smoker  . COPD Sister        End-stage COPD    Social History Social History  Substance Use Topics  . Smoking status: Former Smoker    Quit date: 06/16/1997  . Smokeless tobacco: Never Used  . Alcohol use Yes  Allergies   Doxycycline   Review of Systems Review of Systems  All other systems reviewed and are negative.    Physical Exam Updated Vital Signs BP 123/75   Pulse 92   Temp 97.8 F (36.6 C) (Oral)   Resp 20   Ht 5\' 11"  (1.803 m)   Wt 101.2 kg (223 lb)   SpO2 100%   BMI 31.10 kg/m   Physical Exam  Nursing note and vitals reviewed.  76 year old male, resting comfortably and in no acute distress. Vital signs are significant for tachycardia. Oxygen saturation is 100%, which is normal. Head is normocephalic and atraumatic. PERRLA, EOMI. Oropharynx is clear. Neck is nontender and supple without adenopathy or JVD. Back is nontender and there is no CVA tenderness. Lungs are clear without rales, wheezes, or rhonchi. Chest is nontender. Heart is tachycardic and irregular without murmur. Prominent aortic valve click is noted. Abdomen is soft, flat, nontender without masses or hepatosplenomegaly and peristalsis is normoactive. Extremities have 1+ edema, full range of motion is present. Skin is warm and dry without rash. Neurologic: Mental status is normal, cranial nerves are intact, there are no motor or sensory deficits.  ED Treatments / Results  Labs (all labs ordered are listed, but only abnormal results are displayed) Labs Reviewed  BASIC METABOLIC PANEL - Abnormal; Notable for the following:       Result Value   Sodium 130 (*)     Chloride 94 (*)    Glucose, Bld 118 (*)    Calcium 8.8 (*)    All other components within normal limits  CBC  PROTIME-INR  I-STAT TROPONIN, ED    EKG  EKG Interpretation  Date/Time:  Saturday March 04 2017 12:05:04 EDT Ventricular Rate:  118 PR Interval:    QRS Duration: 84 QT Interval:  320 QTC Calculation: 448 R Axis:   28 Text Interpretation:  Atrial fibrillation with rapid ventricular response with premature ventricular or aberrantly conducted complexes Low voltage QRS Septal infarct , age undetermined Abnormal ECG When compared with ECG of 12/31/2002, Atrial fibrillation with rapid ventricular response has replaced Sinus rhythm Premature ventricular complexes are now present Low voltage QRS is now present Confirmed by Delora Fuel ([id number]) on 03/04/2017 1:18:22 PM        EKG Interpretation  Date/Time:  Saturday March 04 2017 14:00:31 EDT Ventricular Rate:  72 PR Interval:    QRS Duration: 108 QT Interval:  399 QTC Calculation: 437 R Axis:   52 Text Interpretation:  Sinus rhythm Atrial premature complexes Low voltage, precordial leads When compared with ECG of EARLIER SAME DATE Sinus rhythm has replaced Atrial fibrillation with rapid ventricular response Confirmed by Delora Fuel ([id number]) on 03/04/2017 2:30:10 PM        Radiology Dg Chest 2 View  Result Date: 03/04/2017 CLINICAL DATA:  Center chest pain today. SOB on exertion x1 week. Pt states he saw his doctor earlier this week and was told he was in A.Fib and they scheduled to see a cardiologist this coming Monday. Hx of HTN, CAD, s/p CABG x 4, status post aortic valve replacement. EXAM: CHEST  2 VIEW COMPARISON:  None available FINDINGS: Status post median sternotomy and valve replacement. The heart is enlarged. There is mild atelectasis in the lung bases bilaterally. No focal consolidations or pleural effusions. No pulmonary edema. The thoracic aorta partially calcified. IMPRESSION: Cardiomegaly and mild  bibasilar atelectasis. Electronically Signed   By: Nolon Nations M.D.   On: 03/04/2017 13:06  Procedures .Cardioversion Date/Time: 03/04/2017 2:24 PM Performed by: Delora Fuel Authorized by: Roxanne Mins, Zalika Tieszen   Consent:    Consent obtained:  Written   Consent given by:  Patient   Risks discussed:  Cutaneous burn and induced arrhythmia   Alternatives discussed:  Rate-control medication Pre-procedure details:    Cardioversion basis:  Emergent   Rhythm:  Atrial fibrillation Patient sedated: Yes. Refer to sedation procedure documentation for details of sedation.  Attempt one:    Cardioversion mode:  Synchronous   Waveform:  Biphasic   Shock (Joules):  120   Shock outcome:  Conversion to normal sinus rhythm Post-procedure details:    Patient status:  Awake   Patient tolerance of procedure:  Tolerated well, no immediate complications Comments:     Successful conversion to sinus rhythm, but numerous PAC's noted.   .Sedation Date/Time: 03/04/2017 2:25 PM Performed by: Delora Fuel Authorized by: Roxanne Mins, Zahari Fazzino   Consent:    Consent obtained:  Verbal   Consent given by:  Patient   Risks discussed:  Allergic reaction, dysrhythmia, inadequate sedation, nausea, prolonged hypoxia resulting in organ damage, prolonged sedation necessitating reversal, respiratory compromise necessitating ventilatory assistance and intubation and vomiting   Alternatives discussed:  Analgesia without sedation, anxiolysis and regional anesthesia Universal protocol:    Procedure explained and questions answered to patient or proxy's satisfaction: yes     Relevant documents present and verified: yes     Test results available and properly labeled: yes     Imaging studies available: yes     Required blood products, implants, devices, and special equipment available: yes     Site/side marked: yes     Immediately prior to procedure a time out was called: yes     Patient identity confirmation method:  Verbally  with patient Indications:    Procedure necessitating sedation performed by:  Physician performing sedation   Intended level of sedation:  Deep Pre-sedation assessment:    ASA classification: class 1 - normal, healthy patient     Neck mobility: normal     Mouth opening:  3 or more finger widths   Thyromental distance:  4 finger widths   Mallampati score:  I - soft palate, uvula, fauces, pillars visible   Pre-sedation assessments completed and reviewed: cardiovascular function, hydration status, mental status, nausea/vomiting, pain level, respiratory function and temperature     Pre-sedation assessment completed:  03/04/2017 1:58 PM Immediate pre-procedure details:    Reassessment: Patient reassessed immediately prior to procedure     Reviewed: vital signs, relevant labs/tests and NPO status     Verified: bag valve mask available, emergency equipment available, intubation equipment available, IV patency confirmed, oxygen available and suction available   Procedure details (see MAR for exact dosages):    Preoxygenation:  Nasal cannula   Sedation:  Propofol   Intra-procedure monitoring:  Blood pressure monitoring, cardiac monitor, continuous pulse oximetry, frequent LOC assessments, frequent vital sign checks and continuous capnometry   Intra-procedure events: none     Total Provider sedation time (minutes):  30 Post-procedure details:    Post-sedation assessment completed:  03/04/2017 2:28 PM   Attendance: Constant attendance by certified staff until patient recovered     Recovery: Patient returned to pre-procedure baseline     Post-sedation assessments completed and reviewed: airway patency, cardiovascular function, hydration status, mental status, nausea/vomiting, pain level, respiratory function and temperature     Patient is stable for discharge or admission: yes     Patient tolerance:  Tolerated well, no  immediate complications   (including critical care time) CRITICAL  CARE Performed by: GGEZM,OQHUT Total critical care time: 35 minutes Critical care time was exclusive of separately billable procedures and treating other patients. Critical care was necessary to treat or prevent imminent or life-threatening deterioration. Critical care was time spent personally by me on the following activities: development of treatment plan with patient and/or surrogate as well as nursing, discussions with consultants, evaluation of patient's response to treatment, examination of patient, obtaining history from patient or surrogate, ordering and performing treatments and interventions, ordering and review of laboratory studies, ordering and review of radiographic studies, pulse oximetry and re-evaluation of patient's condition.  Medications Ordered in ED Medications - No data to display   Initial Impression / Assessment and Plan / ED Course  I have reviewed the triage vital signs and the nursing notes.  Pertinent labs & imaging results that were available during my care of the patient were reviewed by me and considered in my medical decision making (see chart for details).  Atrial fibrillation with rapid ventricular response. He is not tolerating this is evidenced by chest pain and dyspnea. Because he is adequately anticoagulated, I feel comfortable proceeding with cardioversion today. Old records are reviewed, and I see no prior episodes of atrial fibrillation.  Patient was successfully cardioverted, but he is noted to have numerous PACs. I was concerned about whether he might revert back to atrial fibrillation. I discussed the case with Dr. Curt Bears of cardiology service and decision is made to start him on oral amiodarone to try to stabilize his rhythm. He is given a dose in the emergency department. He was observed following sedation and has returned to baseline. He is discharged with prescription for amiodarone and is referred to the atrial fibrillation clinic.  CHA2DS2/VAS  Stroke Risk Points      3 >= 2 Points: High Risk  1 - 1.99 Points: Medium Risk  0 Points: Low Risk    The patient's score has not changed in the past year.:  No Change         Details    Note: External data might be a factor in metrics not marked with    Points Metrics   This score determines the patient's risk of having a stroke if the  patient has atrial fibrillation.       0 Has Congestive Heart Failure:  No   1 Has Vascular Disease:  Yes   1 Has Hypertension:  Yes   1 Age:  78   0 Has Diabetes:  No   0 Had Stroke:  No Had TIA:  No Had thromboembolism:  No   0 Male:  No          Final Clinical Impressions(s) / ED Diagnoses   Final diagnoses:  Atrial fibrillation with RVR (HCC)  Shortness of breath  Chest pain, unspecified type  Anticoagulated on warfarin    New Prescriptions New Prescriptions   AMIODARONE (PACERONE) 200 MG TABLET    Take 1 tablet (200 mg total) by mouth daily.      Delora Fuel, MD 65/46/50 [phone]

## 2017-03-04 NOTE — Sedation Documentation (Signed)
EKG done

## 2017-03-04 NOTE — Sedation Documentation (Signed)
First shock delivered at 120J

## 2017-03-06 ENCOUNTER — Encounter: Payer: Self-pay | Admitting: Cardiology

## 2017-03-06 ENCOUNTER — Encounter: Payer: Self-pay | Admitting: Cardiovascular Disease

## 2017-03-06 NOTE — Progress Notes (Signed)
Patient hospitalized in [place] with atrial fibrillation with rapid ventricular response, symptomatic.  Rate control achieved with intravenous diltiazem.  Plan is to increase dose of metoprolol XL to 100 mg daily follow-up.  Note that echo in 2016 showed left atrial diameter greater than 5 cm.  Echo performed in [place] reportedly shows EF 45-50%, not that different from this evaluation which showed EF. He is already scheduled for a nuclear stress test on November 13 and follow-up with Dr. Ellyn Hack November 15. Will change the nuclear stress test to a pharmacological one, not treadmill.  Sanda Klein, MD, [facility] [phone] office [phone] pager

## 2017-03-07 ENCOUNTER — Ambulatory Visit (HOSPITAL_COMMUNITY): Payer: Medicare HMO | Admitting: Nurse Practitioner

## 2017-03-07 ENCOUNTER — Telehealth (HOSPITAL_COMMUNITY): Payer: Self-pay | Admitting: *Deleted

## 2017-03-07 ENCOUNTER — Telehealth: Payer: Self-pay | Admitting: Cardiology

## 2017-03-07 NOTE — Telephone Encounter (Signed)
New message    Patient states he needs to know if he should return to ED.  Sent Mychart message last night.    Patient c/o Palpitations:  High priority if patient c/o lightheadedness, shortness of breath, or chest pain  1) How long have you had palpitations/irregular HR/ Afib? Are you having the symptoms now? Yes  2) Are you currently experiencing lightheadedness, SOB or CP? Some chest pain  3) Do you have a history of afib (atrial fibrillation) or irregular heart rhythm? No  4) Have you checked your BP or HR? (document readings if available): No  5) Are you experiencing any other symptoms?  Chest pain    Pt c/o of Chest Pain: STAT if CP now or developed within 24 hours  1. Are you having CP right now? YES  2. Are you experiencing any other symptoms (ex. SOB, nausea, vomiting, sweating)? no  3. How long have you been experiencing CP? Since this morning  4. Is your CP continuous or coming and going? Continuous  5. Have you taken Nitroglycerin? No ?

## 2017-03-07 NOTE — Telephone Encounter (Signed)
SPOKE TO PATIENT . PATIENT STATES HE HAS BEEN TO ER  SEVERAL TIMES IN THE LAST FEW DAYS.  WITH AFIB. AT [place] AND [place]--  THE PATIENT STATES HE AWAKEN TODAY  HEART RATE DIFFERENT - HE TOOK  EXTRA METOPROLOL - PATIENT IS CONCERNED  CHEST DISCOMFORT  NO RADIATION , NO NAUSEA . APPOINTMENT  SCHEDULE MOVED TODAY 2:45 AT AFIB CLINIC  PATIENT AWARE.

## 2017-03-07 NOTE — Telephone Encounter (Signed)
Patient called in prior to his 245pm appt at 12:30 stating he got to feeling worse and was sitting at the [facility] ER and would not be attending appointment today. Informed patient to call to reschedule appt if he does not receive quick follow up from the ER through [place].

## 2017-03-08 ENCOUNTER — Encounter (HOSPITAL_COMMUNITY): Payer: Self-pay | Admitting: Nurse Practitioner

## 2017-03-08 ENCOUNTER — Ambulatory Visit (HOSPITAL_COMMUNITY)
Admission: RE | Admit: 2017-03-08 | Discharge: 2017-03-08 | Disposition: A | Discharge status: Home / Self Care | Payer: Medicare HMO | Source: Ambulatory Visit | Attending: Nurse Practitioner | Admitting: Nurse Practitioner

## 2017-03-08 ENCOUNTER — Inpatient Hospital Stay (HOSPITAL_COMMUNITY): Admission: RE | Admit: 2017-03-08 | Payer: Medicare HMO | Source: Ambulatory Visit | Admitting: Nurse Practitioner

## 2017-03-08 VITALS — BP 118/54 | HR 106 | Ht 71.0 in | Wt 213.0 lb

## 2017-03-08 DIAGNOSIS — I10 Essential (primary) hypertension: Secondary | ICD-10-CM | POA: Diagnosis not present

## 2017-03-08 DIAGNOSIS — E785 Hyperlipidemia, unspecified: Secondary | ICD-10-CM | POA: Insufficient documentation

## 2017-03-08 DIAGNOSIS — I4891 Unspecified atrial fibrillation: Secondary | ICD-10-CM | POA: Insufficient documentation

## 2017-03-08 DIAGNOSIS — I481 Persistent atrial fibrillation: Secondary | ICD-10-CM

## 2017-03-08 DIAGNOSIS — Z7901 Long term (current) use of anticoagulants: Secondary | ICD-10-CM | POA: Diagnosis not present

## 2017-03-08 DIAGNOSIS — D649 Anemia, unspecified: Secondary | ICD-10-CM | POA: Insufficient documentation

## 2017-03-08 DIAGNOSIS — Z9049 Acquired absence of other specified parts of digestive tract: Secondary | ICD-10-CM | POA: Insufficient documentation

## 2017-03-08 DIAGNOSIS — Z87891 Personal history of nicotine dependence: Secondary | ICD-10-CM | POA: Insufficient documentation

## 2017-03-08 DIAGNOSIS — R0789 Other chest pain: Secondary | ICD-10-CM | POA: Diagnosis not present

## 2017-03-08 DIAGNOSIS — Z7982 Long term (current) use of aspirin: Secondary | ICD-10-CM | POA: Diagnosis not present

## 2017-03-08 DIAGNOSIS — K56609 Unspecified intestinal obstruction, unspecified as to partial versus complete obstruction: Secondary | ICD-10-CM | POA: Insufficient documentation

## 2017-03-08 DIAGNOSIS — D472 Monoclonal gammopathy: Secondary | ICD-10-CM | POA: Diagnosis not present

## 2017-03-08 DIAGNOSIS — J449 Chronic obstructive pulmonary disease, unspecified: Secondary | ICD-10-CM | POA: Insufficient documentation

## 2017-03-08 DIAGNOSIS — Z79899 Other long term (current) drug therapy: Secondary | ICD-10-CM | POA: Diagnosis not present

## 2017-03-08 DIAGNOSIS — I2510 Atherosclerotic heart disease of native coronary artery without angina pectoris: Secondary | ICD-10-CM | POA: Diagnosis present

## 2017-03-08 DIAGNOSIS — L409 Psoriasis, unspecified: Secondary | ICD-10-CM | POA: Insufficient documentation

## 2017-03-08 DIAGNOSIS — K589 Irritable bowel syndrome without diarrhea: Secondary | ICD-10-CM | POA: Diagnosis not present

## 2017-03-08 DIAGNOSIS — I4819 Other persistent atrial fibrillation: Secondary | ICD-10-CM

## 2017-03-08 DIAGNOSIS — Z951 Presence of aortocoronary bypass graft: Secondary | ICD-10-CM | POA: Insufficient documentation

## 2017-03-08 DIAGNOSIS — Z952 Presence of prosthetic heart valve: Secondary | ICD-10-CM | POA: Insufficient documentation

## 2017-03-08 DIAGNOSIS — K219 Gastro-esophageal reflux disease without esophagitis: Secondary | ICD-10-CM | POA: Diagnosis not present

## 2017-03-08 MED ORDER — AMIODARONE HCL 200 MG PO TABS: 200.0000 mg | ORAL_TABLET | Freq: Two times a day (BID) | ORAL | 0 refills | Status: DC

## 2017-03-08 MED ORDER — METOPROLOL SUCCINATE ER 50 MG PO TB24: 50.0000 mg | ORAL_TABLET | Freq: Every day | ORAL | 2 refills | Status: DC

## 2017-03-08 NOTE — Patient Instructions (Addendum)
Your physician has recommended you make the following change in your medication:  1)Start Amiodarone 200mg  twice a day  2)Decrease Metoprolol to 50mg  once a day   No caffeine 4 hours prior to lung test on Monday.

## 2017-03-08 NOTE — Progress Notes (Signed)
Primary Care Physician: Olin Hauser, MD Referring Physician: [facility] ER f/u Cardiologist: Dr. Esmond Camper Erik Meyer is a 76 y.o. male with a h/o  COPD, CAD, s/p bypass and aortic valve replacement,2004, monoclonal gammopathy of undetermined significance. For the last week, he had noted dyspnea which he assumed  may have been part of his COPD. He did not notice  cough or fever. He was also been having some intermittent chest pain. He saw his PCP 10/26, who noted that he was in atrial fibrillation and advised he increase his dose of metoprolol. He was to follow-up with his cardiologist, but told to come to the ED if he felt worse. He presented to [facility] ER 10/27 with chest pain that had been constant since about 9 AM that day, and increasing dyspnea. He is anticoagulated on warfarin, and INR was 3.9 two days ago. He was cardioverted in the ER to SR but had a lot of PC's. Dr. Curt Bears was consulted and he recommended starting the pt on amiodarone 200 mg a day. However, the opt did not start drug.  He was then scheduled to be seen in the afib clinic yesterday around 3pm, but called earlier in the day and said he had return of chest pain and was going to [place] to seek treatment. He was told not to start amiodarone because he was on metoprolol and this drug was increased for better rate control and he was d/c home. He had chest/abd CT, labs with INR of 2.7, TSH, liver enzymes normal. Echo- done and showed EF 45-50 % with severely dilated  Left atrium.  He is now in the afib clinic for f/u, 10/31. He has a v rate of 106 bpm, and continues to feel chest tightness. He was diuresed at one of the visits and his weight is 213 lbs today, he states with clothes his usual weight is 215 lbs. He is also fatigued and more short of breath.  Today, he denies symptoms of palpitations,  orthopnea, PND, lower extremity edema, dizziness, presyncope, syncope, or neurologic sequela. The patient is tolerating  medications without difficulties and is otherwise without complaint today.   Past Medical History:  Diagnosis Date  . Anemia   . CAD, multiple vessel 2004   Found during preop cath for [facility] AVR procedure; Myoview 5/'13: No ischemia or infarct, EF 53%  . COPD (chronic obstructive pulmonary disease) ([place])   . Diverticulosis   . Dyslipidemia, goal LDL below 70    Monitored by PCP. On statin (last labs scan from September 2015: TC 149, TG 127, HDL 55, LDL 69)  . ED (erectile dysfunction)   . GERD (gastroesophageal reflux disease)   . History of SBO (small bowel obstruction) March 2015   Abdominal surgery with LOA  . Hx of irritable bowel syndrome   . Hypertension, essential   . Lesion of right native kidney  08/20/2013   Abdominal US : 2.4 cm x 1.7 complex lesion in the upper pole the right kidney in March of 2012 concerning for RCC although enlarging hemorrhagic cyst could have this appearance as well.,   . Monoclonal gammopathy of undetermined significance   . Psoriasis   . S/P AVR (aortic valve replacement) and aortoplasty 2004   Dr. Cyndia BentDeneen Harts procedure - St. Jude aVR (25 mm prosthesis) with aortic root conduit;; Echo 10/'14: EF 55-60%, Mod Conc LVH, Gr 1 DD, Well Seated AoV Mech Prosthesis with normal P gradients (no stenosis), Mod-Severe LA dilation --> follow-up echo  November 2016: Well functioning mechanical valve. Paradoxical septal motion. EF 50-55%. Severe LA dilation. Moderate RA dilation.   . S/P CABG x 4 2004   LIMA-LAD, SVG-D1, SVG- PDA-PLA (along with AVR)   Past Surgical History:  Procedure Laterality Date  . ABDOMINAL AORTIC ANEURYSM REPAIR    . AORTIC VALVE REPLACEMENT    . ASD REPAIR  12/26/2002   Aortic valve replacement and replacement of aortic root aneurysm using a 25 mm St. JUDE mechanical valve cinduit with reimplantation of the coronary arteeries  . CARDIAC CATHETERIZATION  Aug  13,2004   diffuse  coronary disease,prior to his BENTALL PROCEDURE  .  CORONARY ARTERY BYPASS GRAFT  12/26/2002   LIMA to LAD,SVG to diagonal, SVG to PDA and PLA.   Marland Kitchen LAPAROSCOPIC LYSIS OF ADHESIONS  March 2015   performed in [place] for SBO   . NM MYOVIEW LTD  10/04/2011   showed no ischemia;EF52%  . PARTIAL COLECTOMY  1994  . TRANSTHORACIC ECHOCARDIOGRAM  October 2013   CW doppler gradient of 2.68m/ second across the aortic valve with a mean  gradient of 12 ,which was normal for valve, normal RSVP ANDSYSTOLIC FUNCTION WIYH DIASTOLIC RELAXATION ABNORMALITY  . TRANSTHORACIC ECHOCARDIOGRAM  October 2014   EF 55-60%. Mild concentric LVH;  Grade 1 Diastolic Dysfunction.; St. Jude familiar mechanical prosthesis well seated. No evidence of stenosis. Gradients normal for  prosthesis; moderate to severely dilated left atrium.   . TRANSTHORACIC ECHOCARDIOGRAM  2014; 03/2015   Echo 10/'14: EF 55-60%, Mod Conc LVH, Gr 1 DD, Well Seated AoV Mech Prosthesis with normal P gradients (no stenosis), Mod-Severe LA dilation --> follow-up echo November 2016: Well functioning mechanical valve. Paradoxical septal motion. EF 50-55%. Severe LA dilation. Moderate RA dilation.   . VENTRAL HERNIA REPAIR     repair with mesh by Dr Collene Mares at [facility]    Current Outpatient Prescriptions  Medication Sig Dispense Refill  . ampicillin (PRINCIPEN) 500 MG capsule Take 500 mg by mouth daily.     Marland Kitchen aspirin 81 MG tablet Take 81 mg by mouth daily.    . DENTA 5000 PLUS 1.1 % CREA dental cream Place 1 Squirt onto teeth daily.  6  . IRON PO Take 325 mg by mouth 2 (two) times daily.     . metoprolol succinate (TOPROL-XL) 50 MG 24 hr tablet Take 1 tablet (50 mg total) by mouth daily. Take with or immediately following a meal. 90 tablet 2  . simvastatin (ZOCOR) 20 MG tablet TAKE 1 TABLET AT BEDTIME 90 tablet 2  . tamsulosin (FLOMAX) 0.4 MG CAPS capsule Take 0.4 mg by mouth daily.    . valsartan-hydrochlorothiazide (DIOVAN-HCT) 320-12.5 MG tablet TAKE 1 TABLET EVERY DAY 90 tablet 2  . warfarin  (COUMADIN) 5 MG tablet Take 1 and 1/2 tablets to 2 tablets daily as directed by coumadin clinic (Patient taking differently: Take by mouth. 10 mg Sunday Tuesday Thursday. 7.5 all other days) 180 tablet 1  . amiodarone (PACERONE) 200 MG tablet Take 1 tablet (200 mg total) by mouth 2 (two) times daily. 60 tablet 0   No current facility-administered medications for this encounter.     Allergies  Allergen Reactions  . Doxycycline Rash    Social History   Social History  . Marital status: Married    Spouse name: N/A  . Number of children: 0  . Years of education: N/A   Occupational History  .      Retired   Social History Main Topics  .  Smoking status: Former Smoker    Quit date: 06/16/1997  . Smokeless tobacco: Never Used  . Alcohol use Yes  . Drug use: No  . Sexual activity: Not on file   Other Topics Concern  . Not on file   Social History Narrative   Married gentleman with no children. Quit smoking in 1998.   Education: 2 years of college. He does drink caffeine.  He drinks social beer.   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 Claiborne Billings in [place]; GI: Dr. Janus Molder in [place]       Family History  Problem Relation Age of Onset  . Colon cancer Mother   . Congestive Heart Failure Mother        Cardiomyopathy  . Lung cancer Father        Was a chronic smoker  . COPD Sister        End-stage COPD    ROS- All systems are reviewed and negative except as per the HPI above  Physical Exam: Vitals:   03/08/17 0922  BP: (!) 118/54  Pulse: (!) 106  Weight: 213 lb (96.6 kg)  Height: 5\' 11"  (1.803 m)   Wt Readings from Last 3 Encounters:  03/08/17 213 lb (96.6 kg)  03/04/17 223 lb (101.2 kg)  03/21/16 216 lb 12.8 oz (98.3 kg)    Labs: Lab Results  Component Value Date   NA 130 (L) 03/04/2017   K 3.7  03/04/2017   CL 94 (L) 03/04/2017   CO2 26 03/04/2017   GLUCOSE 118 (H) 03/04/2017   BUN 12 03/04/2017   CREATININE 1.11 03/04/2017   CALCIUM 8.8 (L) 03/04/2017   Lab Results  Component Value Date   INR 3.23 03/04/2017   No results found for: CHOL, HDL, LDLCALC, TRIG   GEN- The patient is well appearing, alert and oriented x 3 today.   Head- normocephalic, atraumatic Eyes-  Sclera clear, conjunctiva pink Ears- hearing intact Oropharynx- clear Neck- supple, no JVP Lymph- no cervical lymphadenopathy Lungs- Clear to ausculation bilaterally, normal work of breathing Heart-irregular rate and rhythm, no murmurs, rubs or gallops, PMI not laterally displaced GI- soft, NT, ND, + BS Extremities- no clubbing, cyanosis, or edema MS- no significant deformity or atrophy Skin- no rash or lesion Psych- euthymic mood, full affect Neuro- strength and sensation are intact  EKG- afib at 106 bpm Epic records reviewed  Interpretation Summary  A complete portable two-dimensional transthoracic echocardiogram with color  flow Doppler and Spectral Doppler was performed. The study was technically  difficult. The previous echo is from 2005.  The left ventricle is mildly dilated.  There is mild asymmetric left ventricular hypertrophy.  The left ventricular ejection fraction is mildly reduced (45-50%).  There is mild (1+) mitral regurgitation.  There is mild (1+) tricuspid regurgitation.  The left atrium is severely dilated.  The right atrium is moderately dilated.  Unable to adequately determine diastolic dysfunction.  Dilated inferior vena cava suggests increased right atrial pressure.  Right ventricular systolic pressure is elevated at 43mmHg, with moderate  pulmonary hypertension.            Assessment and Plan: 1. New onset symptomatic afib with RVR Successful cardioversion in [facility] ER 10/27  but early return of afib  Dr. Curt Bears, when consulted by ER MD, for many  PC's noted with return to SR,  suggested starting amiodarone 200 mg a day which he did not do Increase of BB at [facility] yesterday and pt still has HR over 100  Discussed options re restoring SR He would prefer to continue with outpatient  plans for amiodarone  Will start at 200 mg bid He will reduce metoprolol to 50 mg a day He is drinking 2 beers a day and asked to  cut down to 2 a week He will return on Monday for EKG and INR Continue warfarin for a chadsvasc score of at least 4 He will need weekly INR's for addition of amiodarone and plans for cardioversion in about one month Baseline  PFT's Recent TSH/liver enzymes normal Weigh daily, notify office if steady increase in weight Avoid salt  2. Chest discomfort Possibly  from RVR He is scheduled to have stress test  11/13 and 11/15  f/u with Dr. Ellyn Hack  I will schedule  to f/u with Dr. Curt Bears in [place] (as pt lives there) in about 6 weeks  Geroge Baseman. Varsha Knock, [facility] [address] [place], [place] [address] [phone]

## 2017-03-09 ENCOUNTER — Ambulatory Visit (HOSPITAL_COMMUNITY): Payer: Medicare HMO | Admitting: Nurse Practitioner

## 2017-03-13 ENCOUNTER — Ambulatory Visit (HOSPITAL_COMMUNITY)
Admission: RE | Admit: 2017-03-13 | Discharge: 2017-03-13 | Disposition: A | Discharge status: Home / Self Care | Payer: Medicare HMO | Source: Ambulatory Visit | Attending: Nurse Practitioner | Admitting: Nurse Practitioner

## 2017-03-13 ENCOUNTER — Encounter (HOSPITAL_COMMUNITY): Payer: Self-pay | Admitting: Nurse Practitioner

## 2017-03-13 ENCOUNTER — Encounter (HOSPITAL_COMMUNITY): Payer: Medicare HMO

## 2017-03-13 ENCOUNTER — Other Ambulatory Visit: Payer: Self-pay

## 2017-03-13 VITALS — BP 126/62 | HR 95 | Ht 71.0 in | Wt 211.8 lb

## 2017-03-13 DIAGNOSIS — Z951 Presence of aortocoronary bypass graft: Secondary | ICD-10-CM | POA: Insufficient documentation

## 2017-03-13 DIAGNOSIS — Z9889 Other specified postprocedural states: Secondary | ICD-10-CM | POA: Diagnosis not present

## 2017-03-13 DIAGNOSIS — K589 Irritable bowel syndrome without diarrhea: Secondary | ICD-10-CM | POA: Diagnosis not present

## 2017-03-13 DIAGNOSIS — Z9049 Acquired absence of other specified parts of digestive tract: Secondary | ICD-10-CM | POA: Insufficient documentation

## 2017-03-13 DIAGNOSIS — Z801 Family history of malignant neoplasm of trachea, bronchus and lung: Secondary | ICD-10-CM | POA: Insufficient documentation

## 2017-03-13 DIAGNOSIS — I481 Persistent atrial fibrillation: Secondary | ICD-10-CM

## 2017-03-13 DIAGNOSIS — L409 Psoriasis, unspecified: Secondary | ICD-10-CM | POA: Insufficient documentation

## 2017-03-13 DIAGNOSIS — Z800 Family history of malignant neoplasm of digestive organs: Secondary | ICD-10-CM | POA: Insufficient documentation

## 2017-03-13 DIAGNOSIS — K219 Gastro-esophageal reflux disease without esophagitis: Secondary | ICD-10-CM | POA: Insufficient documentation

## 2017-03-13 DIAGNOSIS — Z7901 Long term (current) use of anticoagulants: Secondary | ICD-10-CM | POA: Insufficient documentation

## 2017-03-13 DIAGNOSIS — E785 Hyperlipidemia, unspecified: Secondary | ICD-10-CM | POA: Insufficient documentation

## 2017-03-13 DIAGNOSIS — Z87891 Personal history of nicotine dependence: Secondary | ICD-10-CM | POA: Insufficient documentation

## 2017-03-13 DIAGNOSIS — Z7982 Long term (current) use of aspirin: Secondary | ICD-10-CM | POA: Diagnosis not present

## 2017-03-13 DIAGNOSIS — J449 Chronic obstructive pulmonary disease, unspecified: Secondary | ICD-10-CM | POA: Diagnosis not present

## 2017-03-13 DIAGNOSIS — Z8249 Family history of ischemic heart disease and other diseases of the circulatory system: Secondary | ICD-10-CM | POA: Insufficient documentation

## 2017-03-13 DIAGNOSIS — I4891 Unspecified atrial fibrillation: Secondary | ICD-10-CM | POA: Diagnosis present

## 2017-03-13 DIAGNOSIS — I10 Essential (primary) hypertension: Secondary | ICD-10-CM | POA: Diagnosis not present

## 2017-03-13 DIAGNOSIS — I4819 Other persistent atrial fibrillation: Secondary | ICD-10-CM

## 2017-03-13 DIAGNOSIS — R0789 Other chest pain: Secondary | ICD-10-CM | POA: Diagnosis not present

## 2017-03-13 DIAGNOSIS — Z952 Presence of prosthetic heart valve: Secondary | ICD-10-CM | POA: Insufficient documentation

## 2017-03-13 DIAGNOSIS — R5383 Other fatigue: Secondary | ICD-10-CM | POA: Diagnosis not present

## 2017-03-13 DIAGNOSIS — R0602 Shortness of breath: Secondary | ICD-10-CM | POA: Diagnosis not present

## 2017-03-13 DIAGNOSIS — Z79899 Other long term (current) drug therapy: Secondary | ICD-10-CM | POA: Insufficient documentation

## 2017-03-13 DIAGNOSIS — K56609 Unspecified intestinal obstruction, unspecified as to partial versus complete obstruction: Secondary | ICD-10-CM | POA: Insufficient documentation

## 2017-03-13 DIAGNOSIS — Z881 Allergy status to other antibiotic agents status: Secondary | ICD-10-CM | POA: Insufficient documentation

## 2017-03-13 DIAGNOSIS — Z8679 Personal history of other diseases of the circulatory system: Secondary | ICD-10-CM | POA: Insufficient documentation

## 2017-03-13 DIAGNOSIS — I2510 Atherosclerotic heart disease of native coronary artery without angina pectoris: Secondary | ICD-10-CM | POA: Insufficient documentation

## 2017-03-13 DIAGNOSIS — Z825 Family history of asthma and other chronic lower respiratory diseases: Secondary | ICD-10-CM | POA: Insufficient documentation

## 2017-03-13 LAB — BASIC METABOLIC PANEL
ANION GAP: 9 (ref 5–15)
BUN: 18 mg/dL (ref 6–20)
CALCIUM: 8.8 mg/dL — AB (ref 8.9–10.3)
CO2: 28 mmol/L (ref 22–32)
Chloride: 94 mmol/L — ABNORMAL LOW (ref 101–111)
Creatinine, Ser: 1.12 mg/dL (ref 0.61–1.24)
GLUCOSE: 108 mg/dL — AB (ref 65–99)
Potassium: 3.2 mmol/L — ABNORMAL LOW (ref 3.5–5.1)
SODIUM: 131 mmol/L — AB (ref 135–145)

## 2017-03-13 LAB — PULMONARY FUNCTION TEST
DL/VA % PRED: 70 %
DL/VA: 3.26 ml/min/mmHg/L
DLCO unc % pred: 57 %
DLCO unc: 19.41 ml/min/mmHg
FEF 25-75 POST: 1.52 L/s
FEF 25-75 Pre: 0.98 L/sec
FEF2575-%Change-Post: 54 %
FEF2575-%PRED-POST: 65 %
FEF2575-%PRED-PRE: 42 %
FEV1-%CHANGE-POST: 14 %
FEV1-%PRED-PRE: 59 %
FEV1-%Pred-Post: 68 %
FEV1-PRE: 1.91 L
FEV1-Post: 2.18 L
FEV1FVC-%CHANGE-POST: -5 %
FEV1FVC-%PRED-PRE: 83 %
FEV6-%Change-Post: 14 %
FEV6-%Pred-Post: 86 %
FEV6-%Pred-Pre: 75 %
FEV6-Post: 3.59 L
FEV6-Pre: 3.14 L
FEV6FVC-%Change-Post: -5 %
FEV6FVC-%Pred-Post: 100 %
FEV6FVC-%Pred-Pre: 106 %
FVC-%Change-Post: 20 %
FVC-%PRED-POST: 85 %
FVC-%PRED-PRE: 70 %
FVC-POST: 3.8 L
FVC-PRE: 3.14 L
POST FEV1/FVC RATIO: 58 %
PRE FEV1/FVC RATIO: 61 %
PRE FEV6/FVC RATIO: 100 %
Post FEV6/FVC ratio: 95 %
RV % pred: 165 %
RV: 4.31 L
TLC % PRED: 105 %
TLC: 7.64 L

## 2017-03-13 LAB — PROTIME-INR
INR: 2.52
PROTHROMBIN TIME: 26.9 s — AB (ref 11.4–15.2)

## 2017-03-13 MED ORDER — ALBUTEROL SULFATE (2.5 MG/3ML) 0.083% IN NEBU
2.5000 mg | INHALATION_SOLUTION | Freq: Once | RESPIRATORY_TRACT | Status: AC
  Administered 2017-03-13: 2.5 mg via RESPIRATORY_TRACT

## 2017-03-14 ENCOUNTER — Other Ambulatory Visit (HOSPITAL_COMMUNITY): Payer: Self-pay | Admitting: *Deleted

## 2017-03-14 MED ORDER — POTASSIUM CHLORIDE CRYS ER 20 MEQ PO TBCR: EXTENDED_RELEASE_TABLET | ORAL | 3 refills | Status: DC

## 2017-03-14 NOTE — Progress Notes (Signed)
Primary Care Physician: Olin Hauser, MD Referring Physician: [facility] ER f/u Cardiologist: Dr. Esmond Camper Erik Meyer is a 76 y.o. male with a h/o  COPD, CAD, s/p bypass and aortic valve replacement,2004, monoclonal gammopathy of undetermined significance. For the last week, he had noted dyspnea which he assumed  may have been part of his COPD. He did not notice  cough or fever. He was also been having some intermittent chest pain. He saw his PCP 10/26, who noted that he was in atrial fibrillation and advised he increase his dose of metoprolol. He was to follow-up with his cardiologist, but told to come to the ED if he felt worse. He presented to [facility] ER 10/27 with chest pain that had been constant since about 9 AM that day, and increasing dyspnea. He is anticoagulated on warfarin, and INR was 3.9 two days ago. He was cardioverted in the ER to SR but had a lot of PC's. Dr. Curt Bears was consulted and he recommended starting the pt on amiodarone 200 mg a day. However, the opt did not start drug.  He was then scheduled to be seen in the afib clinic yesterday around 3pm, but called earlier in the day and said he had return of chest pain and was going to [place] to seek treatment. He was told not to start amiodarone because he was on metoprolol and this drug was increased for better rate control and he was d/c home. He had chest/abd CT, labs with INR of 2.7, TSH, liver enzymes normal. Echo- done and showed EF 45-50 % with severely dilated  Left atrium.  He is now in the afib clinic for f/u, 10/31. He has a v rate of 106 bpm, and continues to feel chest tightness. He was diuresed at one of the visits and his weight is 213 lbs today, he states with clothes his usual weight is 215 lbs. He is also fatigued and more short of breath.  F/u in afib clinic one week after amiodarone loading started at 200 mg bid. He remains in afib but rate slower. He is still c/o chest heaviness, has stress test pending for  the 13th, called [place] line office to see if sooner available and gave pt two earlier options but he wanted to wait for the 13th test. He had baseline PFT's this am. INR pending today in preparation for cardioversion and addition of amiodarone and warfarin.   Today, he denies symptoms of palpitations,  orthopnea, PND, lower extremity edema, dizziness, presyncope, syncope, or neurologic sequela. The patient is tolerating medications without difficulties and is otherwise without complaint today.   Past Medical History:  Diagnosis Date  . Anemia   . CAD, multiple vessel 2004   Found during preop cath for [facility] AVR procedure; Myoview 5/'13: No ischemia or infarct, EF 53%  . COPD (chronic obstructive pulmonary disease) ([place])   . Diverticulosis   . Dyslipidemia, goal LDL below 70    Monitored by PCP. On statin (last labs scan from September 2015: TC 149, TG 127, HDL 55, LDL 69)  . ED (erectile dysfunction)   . GERD (gastroesophageal reflux disease)   . History of SBO (small bowel obstruction) March 2015   Abdominal surgery with LOA  . Hx of irritable bowel syndrome   . Hypertension, essential   . Lesion of right native kidney  08/20/2013   Abdominal US : 2.4 cm x 1.7 complex lesion in the upper pole the right kidney in March of 2012 concerning  for RCC although enlarging hemorrhagic cyst could have this appearance as well.,   . Monoclonal gammopathy of undetermined significance   . Psoriasis   . S/P AVR (aortic valve replacement) and aortoplasty 2004   Dr. Cyndia BentDeneen Harts procedure - St. Jude aVR (25 mm prosthesis) with aortic root conduit;; Echo 10/'14: EF 55-60%, Mod Conc LVH, Gr 1 DD, Well Seated AoV Mech Prosthesis with normal P gradients (no stenosis), Mod-Severe LA dilation --> follow-up echo November 2016: Well functioning mechanical valve. Paradoxical septal motion. EF 50-55%. Severe LA dilation. Moderate RA dilation.   . S/P CABG x 4 2004   LIMA-LAD, SVG-D1, SVG- PDA-PLA (along with  AVR)   Past Surgical History:  Procedure Laterality Date  . ABDOMINAL AORTIC ANEURYSM REPAIR    . AORTIC VALVE REPLACEMENT    . ASD REPAIR  12/26/2002   Aortic valve replacement and replacement of aortic root aneurysm using a 25 mm St. JUDE mechanical valve cinduit with reimplantation of the coronary arteeries  . CARDIAC CATHETERIZATION  Aug  13,2004   diffuse  coronary disease,prior to his BENTALL PROCEDURE  . CORONARY ARTERY BYPASS GRAFT  12/26/2002   LIMA to LAD,SVG to diagonal, SVG to PDA and PLA.   Marland Kitchen LAPAROSCOPIC LYSIS OF ADHESIONS  March 2015   performed in [place] for SBO   . NM MYOVIEW LTD  10/04/2011   showed no ischemia;EF52%  . PARTIAL COLECTOMY  1994  . TRANSTHORACIC ECHOCARDIOGRAM  October 2013   CW doppler gradient of 2.33m/ second across the aortic valve with a mean  gradient of 12 ,which was normal for valve, normal RSVP ANDSYSTOLIC FUNCTION WIYH DIASTOLIC RELAXATION ABNORMALITY  . TRANSTHORACIC ECHOCARDIOGRAM  October 2014   EF 55-60%. Mild concentric LVH;  Grade 1 Diastolic Dysfunction.; St. Jude familiar mechanical prosthesis well seated. No evidence of stenosis. Gradients normal for  prosthesis; moderate to severely dilated left atrium.   . TRANSTHORACIC ECHOCARDIOGRAM  2014; 03/2015   Echo 10/'14: EF 55-60%, Mod Conc LVH, Gr 1 DD, Well Seated AoV Mech Prosthesis with normal P gradients (no stenosis), Mod-Severe LA dilation --> follow-up echo November 2016: Well functioning mechanical valve. Paradoxical septal motion. EF 50-55%. Severe LA dilation. Moderate RA dilation.   . VENTRAL HERNIA REPAIR     repair with mesh by Dr Collene Mares at [facility]    Current Outpatient Medications  Medication Sig Dispense Refill  . amiodarone (PACERONE) 200 MG tablet Take 1 tablet (200 mg total) by mouth 2 (two) times daily. 60 tablet 0  . ampicillin (PRINCIPEN) 500 MG capsule Take 500 mg by mouth daily.     Marland Kitchen aspirin 81 MG tablet Take 81 mg by mouth daily.    . DENTA 5000 PLUS 1.1 % CREA  dental cream Place 1 Squirt onto teeth daily.  6  . furosemide (LASIX) 20 MG tablet Take 20 mg daily by mouth.    . IRON PO Take 325 mg by mouth 2 (two) times daily.     . metoprolol succinate (TOPROL-XL) 50 MG 24 hr tablet Take 1 tablet (50 mg total) by mouth daily. Take with or immediately following a meal. 90 tablet 2  . simvastatin (ZOCOR) 20 MG tablet TAKE 1 TABLET AT BEDTIME 90 tablet 2  . tamsulosin (FLOMAX) 0.4 MG CAPS capsule Take 0.4 mg by mouth daily.    . valsartan-hydrochlorothiazide (DIOVAN-HCT) 320-12.5 MG tablet TAKE 1 TABLET EVERY DAY 90 tablet 2  . warfarin (COUMADIN) 5 MG tablet Take 1 and 1/2 tablets to 2 tablets daily  as directed by coumadin clinic (Patient taking differently: Take by mouth. 10 mg Sunday Tuesday Thursday. 7.5 all other days) 180 tablet 1   No current facility-administered medications for this encounter.     Allergies  Allergen Reactions  . Doxycycline Rash    Social History   Socioeconomic History  . Marital status: Married    Spouse name: Not on file  . Number of children: 0  . Years of education: Not on file  . Highest education level: Not on file  Social Needs  . Financial resource strain: Not on file  . Food insecurity - worry: Not on file  . Food insecurity - inability: Not on file  . Transportation needs - medical: Not on file  . Transportation needs - non-medical: Not on file  Occupational History    Comment: Retired  Tobacco Use  . Smoking status: Former Smoker    Last attempt to quit: 06/16/1997    Years since quitting: 19.7  . Smokeless tobacco: Never Used  Substance and Sexual Activity  . Alcohol use: Yes  . Drug use: No  . Sexual activity: Not on file  Other Topics Concern  . Not on file  Social History Narrative   Married gentleman with no children. Quit smoking in 1998.   Education: 2 years of college. He does drink caffeine.  He drinks social beer.   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 Claiborne Billings in [place]; GI: Dr. Janus Molder in [place]    Family History  Problem Relation Age of Onset  . Colon cancer Mother   . Congestive Heart Failure Mother        Cardiomyopathy  . Lung cancer Father        Was a chronic smoker  . COPD Sister        End-stage COPD    ROS- All systems are reviewed and negative except as per the HPI above  Physical Exam: Vitals:   03/13/17 1137  BP: 126/62  Pulse: 95  Weight: 211 lb 12.8 oz (96.1 kg)  Height: 5\' 11"  (1.803 m)   Wt Readings from Last 3 Encounters:  03/13/17 211 lb 12.8 oz (96.1 kg)  03/08/17 213 lb (96.6 kg)  03/04/17 223 lb (101.2 kg)    Labs: Lab Results  Component Value Date   NA 131 (L) 03/13/2017   K 3.2 (L) 03/13/2017   CL 94 (L) 03/13/2017   CO2 28 03/13/2017   GLUCOSE 108 (H) 03/13/2017   BUN 18 03/13/2017   CREATININE 1.12 03/13/2017   CALCIUM 8.8 (L) 03/13/2017   Lab Results  Component Value Date   INR 2.52 03/13/2017   No results found for: CHOL, HDL, LDLCALC, TRIG   GEN- The patient is well appearing, alert and oriented x 3 today.   Head- normocephalic, atraumatic Eyes-  Sclera clear, conjunctiva pink Ears- hearing intact Oropharynx- clear Neck- supple, no JVP Lymph- no cervical lymphadenopathy Lungs- Clear to ausculation bilaterally, normal work of breathing Heart-irregular rate and rhythm, no murmurs, rubs or gallops, PMI not laterally displaced GI- soft, NT, ND, + BS Extremities- no clubbing, cyanosis, or edema MS- no significant deformity or atrophy Skin- no rash or lesion Psych- euthymic mood, full affect Neuro- strength and sensation are intact  EKG- afib at 106 bpm Epic records reviewed  Interpretation Summary  A complete portable two-dimensional transthoracic echocardiogram with color  flow Doppler and Spectral Doppler was performed. The study  was technically  difficult. The previous echo is from 2005.  The left ventricle is mildly dilated.  There is mild asymmetric left ventricular hypertrophy.  The left ventricular ejection fraction is mildly reduced (45-50%).  There is mild (1+) mitral regurgitation.  There is mild (1+) tricuspid regurgitation.  The left atrium is severely dilated.  The right atrium is moderately dilated.  Unable to adequately determine diastolic dysfunction.  Dilated inferior vena cava suggests increased right atrial pressure.  Right ventricular systolic pressure is elevated at 6mmHg, with moderate  pulmonary hypertension.            Assessment and Plan: 1. New onset symptomatic afib with RVR Successful cardioversion in [facility] ER 10/27  but early return of afib  Dr. Curt Bears, when consulted by ER MD, for many PC's noted with return to SR,  suggested starting amiodarone 200 mg a day which he did not do Increase of BB at [facility] yesterday and pt still has HR over 100  Discussed options re restoring SR Started amiodarone 200 mg bid and pt is back for one week  recheck PFT's done today Continue metoprolol to 50 mg a day Continue warfarin for a chadsvasc score of at least 4 He will need weekly INR's for addition of amiodarone and plans for cardioversion in about one month Bmet  Weigh daily, notify office if steady increase in weight,continue lasix Avoid salt  2. Chest discomfort Possibly  from RVR He is scheduled to have stress test  11/13 and 11/15  f/u with Dr. Ellyn Hack, offered earlier tests but he declined    F/u in 2 weeks here to get set up for cardioversion He is scheduled f/u Dr. Curt Bears in [place] (as pt lives there) in about 4-5 weeks   Erik Meyer, [facility] [address] [place], [place] [address] [phone]

## 2017-03-16 ENCOUNTER — Telehealth (HOSPITAL_COMMUNITY): Payer: Self-pay

## 2017-03-16 NOTE — Telephone Encounter (Signed)
Pt stated he would not walk the treadmill but would do the chemical stress test. I have notified Pam in [facility] about same.

## 2017-03-17 ENCOUNTER — Encounter (INDEPENDENT_AMBULATORY_CARE_PROVIDER_SITE_OTHER): Payer: Self-pay

## 2017-03-20 ENCOUNTER — Other Ambulatory Visit: Payer: Self-pay | Admitting: Cardiology

## 2017-03-20 LAB — PROTIME-INR: INR: 3.6 — AB (ref <=1.1)

## 2017-03-21 ENCOUNTER — Ambulatory Visit (HOSPITAL_COMMUNITY)
Admission: RE | Admit: 2017-03-21 | Discharge: 2017-03-21 | Disposition: A | Discharge status: Home / Self Care | Payer: Medicare HMO | Source: Ambulatory Visit | Attending: Cardiology | Admitting: Cardiology

## 2017-03-21 DIAGNOSIS — Z952 Presence of prosthetic heart valve: Secondary | ICD-10-CM | POA: Diagnosis not present

## 2017-03-21 DIAGNOSIS — R9439 Abnormal result of other cardiovascular function study: Secondary | ICD-10-CM | POA: Diagnosis not present

## 2017-03-21 DIAGNOSIS — Z951 Presence of aortocoronary bypass graft: Secondary | ICD-10-CM | POA: Diagnosis not present

## 2017-03-21 DIAGNOSIS — I2510 Atherosclerotic heart disease of native coronary artery without angina pectoris: Secondary | ICD-10-CM

## 2017-03-21 DIAGNOSIS — E669 Obesity, unspecified: Secondary | ICD-10-CM | POA: Diagnosis not present

## 2017-03-21 DIAGNOSIS — E785 Hyperlipidemia, unspecified: Secondary | ICD-10-CM | POA: Diagnosis not present

## 2017-03-21 DIAGNOSIS — I10 Essential (primary) hypertension: Secondary | ICD-10-CM | POA: Diagnosis not present

## 2017-03-21 DIAGNOSIS — Z7901 Long term (current) use of anticoagulants: Secondary | ICD-10-CM | POA: Diagnosis not present

## 2017-03-21 LAB — MYOCARDIAL PERFUSION IMAGING
CHL CUP NUCLEAR SDS: 3
CHL CUP NUCLEAR SRS: 10
CHL CUP NUCLEAR SSS: 13
CHL CUP RESTING HR STRESS: 100 {beats}/min
LV dias vol: 100 mL (ref 62–150)
LV sys vol: 77 mL
Peak HR: 126 {beats}/min
TID: 1.13

## 2017-03-21 LAB — PROTIME-INR
INR: 3.6 — AB (ref 0.8–1.2)
Prothrombin Time: 35 s — ABNORMAL HIGH (ref 9.1–12.0)

## 2017-03-21 IMAGING — NM NM MISC PROCEDURE
9 series · 54 of 54 positions shown · non-contrast
Comparison: none

[Series 1: rest sax · 6.4mm · 6.40mm/px · 6 of 25 frames shown]
[frame 3/25]
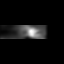
[frame 7/25]
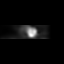
[frame 11/25]
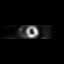
[frame 15/25]
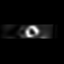
[frame 19/25]
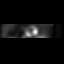
[frame 23/25]
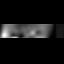

[Series 1: wbr_r-proj_st wbr rest · 6.40mm/px · 6 of 64 frames shown]
[frame 6/64]
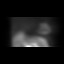
[frame 16/64]
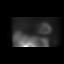
[frame 27/64]
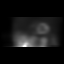
[frame 38/64]
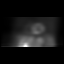
[frame 48/64]
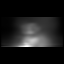
[frame 59/64]
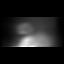

[Series 1: wbr rest · 6.40mm/px · 6 of 64 frames shown]
[frame 6/64]
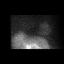
[frame 16/64]
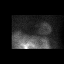
[frame 27/64]
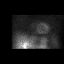
[frame 38/64]
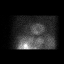
[frame 48/64]
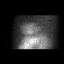
[frame 59/64]
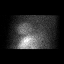

[Series 2: stress sax gs · 6.4mm · 6.40mm/px · 6 of 200 frames shown]
[frame 17/200]
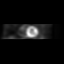
[frame 50/200]
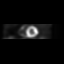
[frame 84/200]
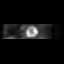
[frame 117/200]
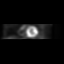
[frame 150/200]
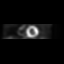
[frame 184/200]
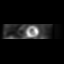

[Series 2: wbr_s-proj_st wbr stress-gsp · 6.40mm/px · 6 of 512 frames shown]
[frame 43/512]
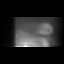
[frame 128/512]
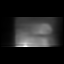
[frame 214/512]
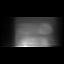
[frame 299/512]
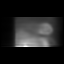
[frame 384/512]
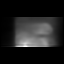
[frame 470/512]
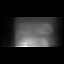

[Series 2: stress sax · 6.4mm · 6.40mm/px · 6 of 25 frames shown]
[frame 3/25]
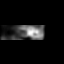
[frame 7/25]
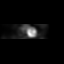
[frame 11/25]
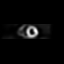
[frame 15/25]
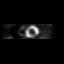
[frame 19/25]
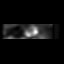
[frame 23/25]
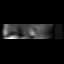

[Series 2: wbr stress-gsp · 6.40mm/px · 6 of 489 frames shown]
[frame 41/489  full-range]
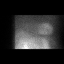
[frame 123/489  full-range]
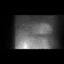
[frame 204/489  full-range]
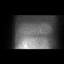
[frame 286/489  full-range]
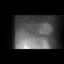
[frame 367/489  full-range]
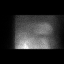
[frame 449/489  full-range]
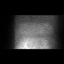

[Series 3: wbr_s-proj_st wbr stress-sum-em · 6.40mm/px · 6 of 64 frames shown]
[frame 6/64]
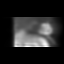
[frame 16/64]
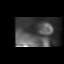
[frame 27/64]
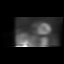
[frame 38/64]
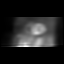
[frame 48/64]
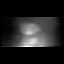
[frame 59/64]
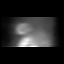

[Series 3: wbr stress-sum-em · 6.40mm/px · 6 of 64 frames shown]
[frame 6/64]
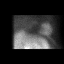
[frame 16/64]
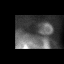
[frame 27/64]
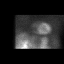
[frame 38/64]
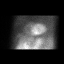
[frame 48/64]
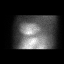
[frame 59/64]
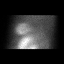

[54 of 54 positions shown; findings below may reference images not displayed]

Canned report from images found in remote index.

Refer to host system for actual result text.

## 2017-03-21 MED ORDER — REGADENOSON 0.4 MG/5ML IV SOLN
0.4000 mg | Freq: Once | INTRAVENOUS | Status: AC
  Administered 2017-03-21: 0.4 mg via INTRAVENOUS

## 2017-03-21 MED ORDER — TECHNETIUM TC 99M TETROFOSMIN IV KIT
26.0000 | PACK | Freq: Once | INTRAVENOUS | Status: AC | PRN
  Administered 2017-03-21: 26 via INTRAVENOUS
  Filled 2017-03-21: qty 26

## 2017-03-21 MED ORDER — TECHNETIUM TC 99M TETROFOSMIN IV KIT
8.2000 | PACK | Freq: Once | INTRAVENOUS | Status: AC | PRN
  Administered 2017-03-21: 8.2 via INTRAVENOUS
  Filled 2017-03-21: qty 9

## 2017-03-21 NOTE — Progress Notes (Signed)
Nuclear stress test shows severely reduced function (not confirmed by echo that was just done at [facility]). There are 2 areas of concern one in the inferior lateral wall and one in the apical anterior wall that are consistent with prior heart attack and some ischemia.   There basically are 2 areas of concern and a reduced ejection fraction which makes this a "high risk study ".  We will need to discuss these findings and the recent echocardiogram findings to determine the next course of action.   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, Incoming Card Results - 03/06/2017  9:18 AM EDT                                                [facility]                                             [address]  +-----------------+ +------------------------------------+     Shelba Flake:                 : :                                    Rondall Allegra,:                 : :                                    :       [place] [id number]   :                 : :                                    :    [phone]:                 : :                                    :    Fax [phone]                 : +------------------------------------+         [id number]     :                 : +-----------------+                                           Echocardiogram  Report +--------------------------------------------------------------------------+ :Name: Erik Meyer            Study Date: 03/06/2017 07:58 AM    : :Patient Location: [place] ZOXW^[id number]    BP: 117/56 mmHg                    : :MRN: [id number]                                                             : :DOB: 1940-12-13                        Height: 71 in                      : :Gender: Male                           Weight: 209 lb                     : :Race: White or Caucasian                                                  : :Age: 76 yrs                             BSA: 2.1 m2                        : :Reason For Study: ^AFib                                                   : :History: AVR, CAD,HTN,AFIB,CHF,ANEMIA,FORMER SMOKER                       : :Referring Physician: Olin Hauser                                        : :Performed By: Starlyn Skeans, BS,RDCS,RCS                                  : +--------------------------------------------------------------------------+  Interpretation Summary A complete portable two-dimensional transthoracic echocardiogram with color flow Doppler and Spectral Doppler was performed. The study was technically difficult. The previous echo is from 2005. The left ventricle is mildly dilated. There is mild asymmetric left ventricular hypertrophy. The left ventricular ejection fraction is mildly reduced (45-50%). There is mild (1+) mitral regurgitation. There is mild (1+) tricuspid regurgitation. The left atrium is severely dilated. The right atrium is moderately dilated. Unable to adequately determine diastolic dysfunction. Dilated inferior vena cava suggests increased right atrial pressure. Right ventricular systolic pressure is elevated at 50mmHg, with moderate pulmonary hypertension.  Left Ventricle The left ventricle is mildly dilated. There is mild asymmetric left ventricular hypertrophy. The left ventricular ejection fraction is mildly reduced (45-50%). No obvious regional wall motion abnormalities, mild global hypokinesis. Unable to adequately determine diastolic dysfunction.   Right Ventricle The right ventricle is grossly normal in size and function.  Atria The left atrium is severely dilated. The right atrium is moderately dilated. Dilated inferior vena cava suggests increased right atrial pressure.  Mitral Valve There is mild mitral annular calcification. The mitral valve is mildly thickened. There is mild (1+) mitral regurgitation.   Tricuspid Valve The tricuspid  valve is grossly normal. There is mild (1+) tricuspid regurgitation. Right ventricular systolic pressure is elevated at 65mmHg, with moderate pulmonary hypertension.  Aortic Valve The aortic valve is not well visualized. There is trace aortic regurgitation present. 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 Mean AV gradient 43mm Hg.  Pulmonic Valve The pulmonic valve is not well visualized. There is mild (1+) pulmonic valvular regurgitation.  Vessels The aortic root is normal in diameter.  Pericardium There is no pericardial effusion.  MMode/2D Measurements & Calculations IVSd: 1.4 cm                        LVIDd: 5.7 cm                                     LVIDs: 4.3 cm                                     LVPWd: 0.97 cm          _____________________________________________________________ LV mass(C)d: 278.9 grams            Ao root diam: 2.3 cm  LV mass(C)dI: 129.8 grams/m2        Ao root area: 4.0 cm2                                     LA dimension: 5.4 cm         _____________________________________________________________  LVOT diam: 2.0 cm                   EDV(sp4-el): 64.5 ml LVOT area: 3.0 cm2                  LVAs ap4: 17.0 cm2                                     LVLs ap4: 6.4 cm                                     ESV(MOD-sp4): 37.2 ml                                     ESV(sp4-el): 38.2 ml         _____________________________________________________________  EDV(MOD-sp2): 87.1 ml  SV(MOD-sp4): 24.9 ml EDV(sp2-el): 89.6 ml ESV(MOD-sp2): 52.1 ml ESV(sp2-el): 54.4 ml         _____________________________________________________________  SV(sp4-el): 26.2 ml                 LAV (MOD-bp) Index: 50.9 ml/m2          _____________________________________________________________ LAV(MOD-bp): 109.3 ml  Doppler Measurements & Calculations Ao V2 max: 155.6 cm/sec               LV V1 max PG: 2.0 mmHg Ao max  PG: 9.7 mmHg                   LV V1 mean PG: 0.98 mmHg Ao V2 mean: 107.1 cm/sec              LV V1 max: 70.8 cm/sec Ao mean PG: 5.1 mmHg                  LV V1 mean: 46.2 cm/sec Ao V2 VTI: 30.2 cm                    LV V1 VTI: 13.8 cm  AVA(I,D): 1.4 cm2 AVA(V,D): 1.4 cm2         _____________________________________________________________  SV(LVOT): 41.5 ml                     TR max vel: 265.4 cm/sec                                       TR max PG: 28.2 mmHg                                       RVSP(TR): 38.2 mmHg         _____________________________________________________________  RAP systole: 10.0 mmHg  ____________________________________________________________________________   Electronically signed [id number] [place]   on   03/06/2017 09:18 AM Ordering Physician: [id number]^^^^^^

## 2017-03-22 ENCOUNTER — Ambulatory Visit (INDEPENDENT_AMBULATORY_CARE_PROVIDER_SITE_OTHER): Payer: Medicare HMO | Admitting: Pharmacist Clinician (PhC)/ Clinical Pharmacy Specialist

## 2017-03-22 DIAGNOSIS — Z952 Presence of prosthetic heart valve: Secondary | ICD-10-CM

## 2017-03-22 DIAGNOSIS — Z7901 Long term (current) use of anticoagulants: Secondary | ICD-10-CM

## 2017-03-23 ENCOUNTER — Encounter: Payer: Self-pay | Admitting: Cardiology

## 2017-03-23 ENCOUNTER — Ambulatory Visit: Payer: Medicare HMO | Admitting: Cardiology

## 2017-03-23 VITALS — BP 148/68 | HR 92 | Ht 72.0 in | Wt 213.0 lb

## 2017-03-23 DIAGNOSIS — I481 Persistent atrial fibrillation: Secondary | ICD-10-CM

## 2017-03-23 DIAGNOSIS — Z01818 Encounter for other preprocedural examination: Secondary | ICD-10-CM | POA: Diagnosis not present

## 2017-03-23 DIAGNOSIS — E785 Hyperlipidemia, unspecified: Secondary | ICD-10-CM

## 2017-03-23 DIAGNOSIS — Z952 Presence of prosthetic heart valve: Secondary | ICD-10-CM

## 2017-03-23 DIAGNOSIS — D689 Coagulation defect, unspecified: Secondary | ICD-10-CM | POA: Diagnosis not present

## 2017-03-23 DIAGNOSIS — Z951 Presence of aortocoronary bypass graft: Secondary | ICD-10-CM

## 2017-03-23 DIAGNOSIS — I10 Essential (primary) hypertension: Secondary | ICD-10-CM | POA: Diagnosis not present

## 2017-03-23 DIAGNOSIS — I2510 Atherosclerotic heart disease of native coronary artery without angina pectoris: Secondary | ICD-10-CM | POA: Diagnosis not present

## 2017-03-23 DIAGNOSIS — I5031 Acute diastolic (congestive) heart failure: Secondary | ICD-10-CM | POA: Diagnosis not present

## 2017-03-23 DIAGNOSIS — I4819 Other persistent atrial fibrillation: Secondary | ICD-10-CM

## 2017-03-23 DIAGNOSIS — R9439 Abnormal result of other cardiovascular function study: Secondary | ICD-10-CM

## 2017-03-23 DIAGNOSIS — I480 Paroxysmal atrial fibrillation: Secondary | ICD-10-CM

## 2017-03-23 DIAGNOSIS — Z7901 Long term (current) use of anticoagulants: Secondary | ICD-10-CM | POA: Diagnosis not present

## 2017-03-23 DIAGNOSIS — I5032 Chronic diastolic (congestive) heart failure: Secondary | ICD-10-CM | POA: Insufficient documentation

## 2017-03-23 HISTORY — DX: Paroxysmal atrial fibrillation: I48.0

## 2017-03-23 NOTE — H&P (View-Only) (Signed)
PCP: Olin Hauser, MD  Clinic Note: Chief Complaint  Patient presents with  . Follow-up    Actually annual follow-up, but several issues.  . Coronary Artery Disease    Abnormal stress test  . Atrial Fibrillation    New diagnosis    HPI: Erik Meyer is a 75 y.o. male with a PMH below who presents today for follow-up for his multiple cardiac issues including new diagnosis of heart failure from atrial fibrillation. I last saw Erik Meyer in November 2017.  We actually ordered a Myoview to be done prior to this visit to follow-up with multivessel disease.  He is here today to discuss the results of the Myoview, but has in the interim also been diagnosed with new onset atrial fibrillation and possibly worsening EF on Myoview.  The Myoview itself was read as abnormal  Afib Clinic  Recent Hospitalizations:  10/27 - [facility] - New Dx Afib RVR--> DCCV (after 7 d of increasing wgt & SOB. 10/28 - [facility] - Afib , CHF - rate control & diuresis 10/30 - Readmit for HF. Amiodarone  - f/u with Afib Clinic - plan facilitated DCCV on Amiodarone  Studies Personally Reviewed - (if available, images/films reviewed: From Epic Chart or Care Everywhere)  Echo ([facility]) 03/06/2017: Mildly reduced EF of 45-50%.  Mild MR and TR.  Severe LA dilation.  Moderate RA dilation.  Mild to moderately elevated PA pressures (42 mm).  Unable to assess diastolic function due to A. fib.  No obvious wall motion normality.  (Difficult to assess because of A. fib.  Myoview March 19, 2017 (here) - was in Afib ~ RVR.-EF 23%.  Global hypokinesis.  (High risk as a result).  2 defects noted: Small size severe defect in the basal inferolateral and mid inferolateral wall consistent with prior MI.  Small sized moderate severity defect in the mid anterior and apical anterior wall consistent with prior infarct with mild peri-infarct ischemia.   Myoview from May 2013: Moderate perfusion defect in the basal inferior and basal  inferolateral wall consistent with infarct/scar with mild peri-infarct ischemia.  There is also a moderate defect in the mid anterior and apical anterior wall consistent with infarct/scar.  EF 52%.  (LOW risk.  However inferior wall defect appeared to be new.)  Interval History: "Erik Meyer" presents here today for follow-up with quite a bit of data to review. He has had quite the exciting turn of events leading from the end of October to now.  We also talked about the results of his stress test especially with him having some early morning chest pain episodes.  He does say however that he is not having any while at the gym doing exercise.  He does not feel his heart rate beating irregular at rest, but whenever he does anything his heart rate will increase then he notes feeling short of breath.  If he then tries to go further than that he will have some discomfort in his chest.  But not routinely. Over the last few visits he has had medications adjusted with amiodarone being started.  That seems to have helped his overall symptoms.  He is breathing a little bit better and noting his heart rate being a bit better.  He is also diuresed a little bit and is now feeling a little bit back to his baseline weight. He really has not noticed any PND or orthopnea.  He has had some edema but this seems to be better now.  He  is now back to low-dose Lasix.  He denies any syncope or near syncope, TIA or amaurosis fugax symptoms.  This did not need to be added.  He was already anticoagulated with warfarin for his valve, therefore  He brings with him his blood pressure and heart rate recordings.  Things were looking great in July and August however it was late October into early November when he started noticing.  He had rates up into the 150s.  Unfortunately, the day he went for a stress test he was in A. fib.  No claudication.  ROS: A comprehensive was performed. Review of Systems  Constitutional: Positive for  malaise/fatigue.  HENT: Negative for nosebleeds.   Respiratory: Negative for cough, shortness of breath (Notably improving.  Return to baseline.) and wheezing.   Cardiovascular:       Per HPI  Gastrointestinal: Negative for abdominal pain, blood in stool, heartburn, melena and nausea.  Musculoskeletal: Positive for back pain.  Neurological: Positive for dizziness. Negative for focal weakness, seizures and loss of consciousness.  Endo/Heme/Allergies: Negative for environmental allergies. Does not bruise/bleed easily.  Psychiatric/Behavioral: Negative for depression and memory loss. The patient is not nervous/anxious.   All other systems reviewed and are negative.  I have reviewed and (if needed) personally updated the patient's problem list, medications, allergies, past medical and surgical history, social and family history.   Past Medical History:  Diagnosis Date  . Anemia   . CAD, multiple vessel 2004   Found during preop cath for [facility] AVR procedure; Myoview 5/'13: No ischemia or infarct, EF 53%  . COPD (chronic obstructive pulmonary disease) ([place])   . Diverticulosis   . Dyslipidemia, goal LDL below 70    Monitored by PCP. On statin (last labs scan from September 2015: TC 149, TG 127, HDL 55, LDL 69)  . ED (erectile dysfunction)   . GERD (gastroesophageal reflux disease)   . History of SBO (small bowel obstruction) March 2015   Abdominal surgery with LOA  . Hx of irritable bowel syndrome   . Hypertension, essential   . Lesion of right native kidney  08/20/2013   Abdominal US : 2.4 cm x 1.7 complex lesion in the upper pole the right kidney in March of 2012 concerning for RCC although enlarging hemorrhagic cyst could have this appearance as well.,   . Monoclonal gammopathy of undetermined significance   . Psoriasis   . S/P AVR (aortic valve replacement) and aortoplasty 2004   Dr. Cyndia BentDeneen Harts procedure - St. Jude aVR (25 mm prosthesis) with aortic root conduit;; Echo 10/'14:  EF 55-60%, Mod Conc LVH, Gr 1 DD, Well Seated AoV Mech Prosthesis with normal P gradients (no stenosis), Mod-Severe LA dilation --> follow-up echo November 2016: Well functioning mechanical valve. Paradoxical septal motion. EF 50-55%. Severe LA dilation. Moderate RA dilation.   . S/P CABG x 4 2004   LIMA-LAD, SVG-D1, SVG- PDA-PLA (along with AVR)    Past Surgical History:  Procedure Laterality Date  . ABDOMINAL AORTIC ANEURYSM REPAIR    . AORTIC VALVE REPLACEMENT    . ASD REPAIR  12/26/2002   Aortic valve replacement and replacement of aortic root aneurysm using a 25 mm St. JUDE mechanical valve cinduit with reimplantation of the coronary arteeries  . CARDIAC CATHETERIZATION  Aug  13,2004   diffuse  coronary disease,prior to his BENTALL PROCEDURE  . CORONARY ARTERY BYPASS GRAFT  12/26/2002   LIMA to LAD,SVG to diagonal, SVG to PDA and PLA.   Marland Kitchen LAPAROSCOPIC LYSIS  OF ADHESIONS  March 2015   performed in [place] for SBO   . NM MYOVIEW LTD  10/04/2011   showed no ischemia;EF52%  . PARTIAL COLECTOMY  1994  . TRANSTHORACIC ECHOCARDIOGRAM  October 2013   CW doppler gradient of 2.1m/ second across the aortic valve with a mean  gradient of 12 ,which was normal for valve, normal RSVP ANDSYSTOLIC FUNCTION WIYH DIASTOLIC RELAXATION ABNORMALITY  . TRANSTHORACIC ECHOCARDIOGRAM  October 2014   EF 55-60%. Mild concentric LVH;  Grade 1 Diastolic Dysfunction.; St. Jude familiar mechanical prosthesis well seated. No evidence of stenosis. Gradients normal for  prosthesis; moderate to severely dilated left atrium.   . TRANSTHORACIC ECHOCARDIOGRAM  2014; 03/2015   Echo 10/'14: EF 55-60%, Mod Conc LVH, Gr 1 DD, Well Seated AoV Mech Prosthesis with normal P gradients (no stenosis), Mod-Severe LA dilation --> follow-up echo November 2016: Well functioning mechanical valve. Paradoxical septal motion. EF 50-55%. Severe LA dilation. Moderate RA dilation.   . VENTRAL HERNIA REPAIR     repair with mesh by Dr Collene Mares  at [facility]  Medication Sig  . amiodarone (PACERONE) 200 MG tablet Take 1 tablet (200 mg total) by mouth 2 (two) times daily.  Marland Kitchen ampicillin (PRINCIPEN) 500 MG capsule Take 500 mg by mouth daily.   Marland Kitchen aspirin 81 MG tablet Take 81 mg by mouth daily.  . DENTA 5000 PLUS 1.1 % CREA dental cream Place 1 Squirt onto teeth daily.  . furosemide (LASIX) 20 MG tablet Take 20 mg daily by mouth.  . IRON PO Take 325 mg by mouth 2 (two) times daily.   . metoprolol succinate (TOPROL-XL) 50 MG 24 hr tablet Take 1 tablet (50 mg total) by mouth daily. Take with or immediately following a meal.  . potassium chloride SA (K-DUR,KLOR-CON) 20 MEQ tablet Take 1 tablet twice a day for 2 days then reduce to 1 tablet daily.  . simvastatin (ZOCOR) 20 MG tablet TAKE 1 TABLET AT BEDTIME  . tamsulosin (FLOMAX) 0.4 MG CAPS capsule Take 0.4 mg by mouth daily.  . valsartan-hydrochlorothiazide (DIOVAN-HCT) 320-12.5 MG tablet TAKE 1 TABLET EVERY DAY  . warfarin (COUMADIN) 5 MG tablet Take 1 and 1/2 tablets to 2 tablets daily as directed by coumadin clinic (Patient taking differently: Take by mouth. 10 mg Sunday Tuesday Thursday. 7.5 all other days)    Allergies  Allergen Reactions  . Doxycycline Rash    Social History   Socioeconomic History  . Marital status: Married    Spouse name: None  . Number of children: 0  . Years of education: None  . Highest education level: None  Social Needs  . Financial resource strain: None  . Food insecurity - worry: None  . Food insecurity - inability: None  . Transportation needs - medical: None  . Transportation needs - non-medical: None  Occupational History    Comment: Retired  Tobacco Use  . Smoking status: Former Smoker    Last attempt to quit: 06/16/1997    Years since quitting: 19.7  . Smokeless tobacco: Never Used  Substance and Sexual Activity  . Alcohol use: Yes  . Drug use: No  . Sexual activity: None  Other Topics Concern  . None  Social History  Narrative   Married gentleman with no children. Quit smoking in 1998.   Education: 2 years of college. He does drink caffeine.  He drinks social beer.   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 Claiborne Billings in [place]; GI: Dr. Janus Molder in [place]    family history includes COPD in his sister; Colon cancer in his mother; Congestive Heart Failure in his mother; Lung cancer in his father.  Wt Readings from Last 3 Encounters:  03/23/17 213 lb (96.6 kg)  03/21/17 216 lb (98 kg)  03/13/17 211 lb 12.8 oz (96.1 kg)    PHYSICAL EXAM BP (!) 148/68   Pulse 92   Ht 6' (1.829 m)   Wt 213 lb (96.6 kg)   BMI 28.89 kg/m  Physical Exam  Constitutional: He is oriented to person, place, and time.  Neck: Normal range of motion. Neck supple. No hepatojugular reflux and no JVD (Mildly elevated with -A. fib waves.) present. Carotid bruit is not present. No thyromegaly present.  Cardiovascular: Normal rate and normal pulses. An irregularly irregular rhythm present. PMI is displaced (Slightly laterally displaced). Exam reveals gallop and S4 (Soft). Exam reveals no friction rub and no midsystolic click.  Murmur heard.  Medium-pitched musical crescendo-decrescendo midsystolic murmur is present with a grade of 2/6 at the upper right sternal border and upper left sternal border radiating to the neck. Mechanical S2  Pulmonary/Chest: Effort normal and breath sounds normal. No respiratory distress. He has no wheezes. He has no rales.  Abdominal: Soft. Bowel sounds are normal. He exhibits no distension. There is no tenderness. There is no rebound.  Musculoskeletal: Normal range of motion. He exhibits edema (R>L 1+-Tr LE edema mild spider veins/ varicosities. RLE stasis dermatitis.).  Neurological: He is alert and oriented to person, place, and time.  Skin:  Skin is warm and dry. No erythema.  Psychiatric: He has a normal mood and affect. His behavior is normal. Judgment and thought content normal.    Adult ECG Report No EKG today   Other studies Reviewed: Additional studies/ records that were reviewed today include:  Recent Labs:   Lab Results  Component Value Date   CREATININE 1.12 03/13/2017   BUN 18 03/13/2017   NA 131 (L) 03/13/2017   K 3.2 (L) 03/13/2017   CL 94 (L) 03/13/2017   CO2 28 03/13/2017   No results found for: CHOL, HDL, LDLCALC, LDLDIRECT, TRIG, CHOLHDL Lab Results  Component Value Date   WBC 9.1 03/04/2017   HGB 12.9 (L) 03/04/2017   HCT 37.4 (L) 03/04/2017   MCV 100.0 03/04/2017   PLT 176 03/04/2017    ASSESSMENT / PLAN: Problem List Items Addressed This Visit    Abnormal nuclear stress test    The more look at these images, it really seems like to studies from 2013 and the current study are relatively similar.  The concerning features at the EF is noted to be somewhat slower.  The read was low risk in 2013, and now high risk partially because of the reduced ejection fraction.  In the absence of any symptoms, I would probably consider this to be a simple difference in how the study is read.  However with new onset A. fib, and chest pain, I think the best option is to proceed with cardiac catheterization. We will wait until post cardioversion in order to avoid interrupting anticoagulation.      Relevant Orders   LEFT HEART CATHETERIZATION WITH CORONARY/GRAFT ANGIOGRAM   Acute diastolic heart failure ([place])    He had acute diastolic heart failure in  setting of A. fib.  Seems to have resolved.  He is now on standing dose of Lasix. We discussed sliding scale dosing as well. He is already on an ARB and beta-blocker.      CAD, multiple vessel - Primary (Chronic)    Multivessel CAD noted at the time of his valve surgery back in 2004.  He underwent CABG at that time. He did have a Myoview in 2013 which was read  as "low risk "with an inferior defect in the basal inferior and basal inferolateral wall consistent with infarct/scar and mild peri-infarct ischemia.  Also moderate defect in the anterior and apical wall consistent with infarct or scar.  EF at that time however was read as 52%.  The current Myoview says EF is 23% now read as high risk.  It seems like the defects are still the same.  At this point with his new onset A. fib, and having chest pain with A. Fib, I think we do need to proceed with cardiac catheterization.  The fact that he is doing better now would mean that we do not have to rush to do this.  I would not want to interrupt his anticoagulation with plans for cardioversion. For now I would like to do is have him undergo cardioversion continue warfarin post cardioversion and then bridge with Lovenox after 2-3 weeks post conversion to schedule cardiac catheterization.  Otherwise plan will be to continue beta-blocker and aspirin and is along with statin. As well as not having active anginal symptoms I will hold off on additional antianginal medications such as Imdur.      Relevant Orders   LEFT HEART CATHETERIZATION WITH CORONARY/GRAFT ANGIOGRAM   Dyslipidemia, goal LDL below 70 (Chronic)    On simvastatin.  Labs followed by PCP.      Hypertension, essential (Chronic)    Blood pressure does look a little bit high today.  He is already on max dose of valsartan-HCTZ.  Could potentially increase metoprolol dose, especially since his heart rate is in 90s. For now we will monitor because he tells me at home his blood pressures look better.  He is a little bit anxious today.      Long term current use of anticoagulant therapy (Chronic)   Persistent atrial fibrillation (HCC) (Chronic)    Being followed by the A. fib clinic.  Plan is facilitated cardioversion while on amiodarone.   We will continue his current dose of metoprolol for additional rate control He clearly is symptomatic with his A.  fib and I think probably continuing amiodarone at least for short-term would be beneficial. Following cardioversion, the plan is to evaluate with cardiac catheterization to exclude an ischemic etiology for the A. fib.  Thankfully, he is doing better on amiodarone.  He was already on warfarin.      Relevant Orders   LEFT HEART CATHETERIZATION WITH CORONARY/GRAFT ANGIOGRAM   S/P AVR (aortic valve replacement) and aortoplasty (Chronic)    He had an echocardiogram done at [facility] which showed his EF being 45% (which is better than the Myoview EF of 23%). It does show biatrial enlargement which would mean that A. fib would be very difficult to control without amiodarone.  The valve seem to be working relatively well seated.      Relevant Orders   LEFT HEART CATHETERIZATION WITH CORONARY/GRAFT ANGIOGRAM   S/P CABG x 4 (Chronic)   Relevant Orders   LEFT HEART CATHETERIZATION WITH CORONARY/GRAFT ANGIOGRAM    Other Visit Diagnoses  Clotting disorder (HCC)       Relevant Orders   CBC   Pre-op testing       Relevant Orders   Protime-INR   Basic metabolic panel   CBC      Current medicines are reviewed at length with the patient today. (+/- concerns) no Med ?s The following changes have been made: see below. = Plan DCCV per Afib clinic, then will need to schedule Afib in ~2 months.  Patient Instructions  NO CHANGE WITH CURRENT MEDICATION AT PRESENT    Your physician recommends that you schedule a follow-up appointment in [place] -- 03/28/17    WILL CONTACT YOU TO SCHEDULE  LEFT HEART CATHETERIZATION WITH MORE INSTRUCTIONS.   Your physician recommends that you schedule a follow-up appointment in [place].        [facility] [address] [place] [address] Dept: [phone] Loc: [place]  03/23/2017  You are scheduled for a Cardiac Catheterization  with Dr. Glenetta Hew  WILL CONTACT WITH MORE DETAILS.  1. Please arrive at the [facility] (Main Entrance A) at [facility]: [address] [place], [place] [address]  (two hours before your procedure to ensure your preparation). Free valet parking service is available.   Special note: Every effort is made to have your procedure done on time. Please understand that emergencies sometimes delay scheduled procedures.  2. Diet: Do not eat or drink anything after midnight prior to your procedure except sips of water to take medications.  3. Labs: WILL CONTACT YOU WHEN TO GET LABS DONE  4. Medication instructions in preparation for your procedure:    WILL CONTACT IN MORE DETAIL IN REGARDS TO LOVENOX BRIDGING/STOPPING WARFARIN       On the morning of your procedure, take your Aspirin and any morning medicines NOT listed above.  You may use sips of water.  5. Plan for one night stay--bring personal belongings. 6. Bring a current list of your medications and current insurance cards. 7. You MUST have a responsible person to drive you home. 8. Someone MUST be with you the first 24 hours after you arrive home or your discharge will be delayed. 9. Please wear clothes that are easy to get on and off and wear slip-on shoes.  Thank you for allowing [place] to care for you!   -- [facility] [facility]    Studies Ordered:   Orders Placed This Encounter  Procedures  . Protime-INR  . Basic metabolic panel  . CBC  . LEFT HEART CATHETERIZATION WITH CORONARY/GRAFT Illene Silver, M.D., M.S. Interventional Cardiologist   Pager # [phone] Phone # [phone] [address] [place], [place] [address]

## 2017-03-23 NOTE — Patient Instructions (Addendum)
NO CHANGE WITH CURRENT MEDICATION AT PRESENT    Your physician recommends that you schedule a follow-up appointment in [place] -- 03/28/17    WILL CONTACT YOU TO SCHEDULE  LEFT HEART CATHETERIZATION WITH MORE INSTRUCTIONS.   Your physician recommends that you schedule a follow-up appointment in [place].        [facility] [address] [place] [address] Dept: [phone] Loc: [place]  03/23/2017  You are scheduled for a Cardiac Catheterization  with Dr. Glenetta Hew  WILL CONTACT WITH MORE DETAILS.  1. Please arrive at the [facility] (Main Entrance A) at [facility]: [address] [place], [place] [address]  (two hours before your procedure to ensure your preparation). Free valet parking service is available.   Special note: Every effort is made to have your procedure done on time. Please understand that emergencies sometimes delay scheduled procedures.  2. Diet: Do not eat or drink anything after midnight prior to your procedure except sips of water to take medications.  3. Labs: WILL CONTACT YOU WHEN TO GET LABS DONE  4. Medication instructions in preparation for your procedure:    WILL CONTACT IN MORE DETAIL IN REGARDS TO LOVENOX BRIDGING/STOPPING WARFARIN       On the morning of your procedure, take your Aspirin and any morning medicines NOT listed above.  You may use sips of water.  5. Plan for one night stay--bring personal belongings. 6. Bring a current list of your medications and current insurance cards. 7. You MUST have a responsible person to drive you home. 8. Someone MUST be with you the first 24 hours after you arrive home or your discharge will be delayed. 9. Please wear clothes that are easy to get on and off and wear slip-on shoes.  Thank you for allowing [place] to care  for you!   -- [place] [facility]

## 2017-03-23 NOTE — Progress Notes (Addendum)
PCP: Olin Hauser, MD  Clinic Note: Chief Complaint  Patient presents with  . Follow-up    Actually annual follow-up, but several issues.  . Coronary Artery Disease    Abnormal stress test  . Atrial Fibrillation    New diagnosis    HPI: Erik Meyer is a 76 y.o. male with a PMH below who presents today for follow-up for his multiple cardiac issues including new diagnosis of heart failure from atrial fibrillation. I last saw AKIO HUDNALL in November 2017.  We actually ordered a Myoview to be done prior to this visit to follow-up with multivessel disease.  He is here today to discuss the results of the Myoview, but has in the interim also been diagnosed with new onset atrial fibrillation and possibly worsening EF on Myoview.  The Myoview itself was read as abnormal  Afib Clinic  Recent Hospitalizations:  10/27 - [facility] - New Dx Afib RVR--> DCCV (after 7 d of increasing wgt & SOB. 10/28 - [facility] - Afib , CHF - rate control & diuresis 10/30 - Readmit for HF. Amiodarone  - f/u with Afib Clinic - plan facilitated DCCV on Amiodarone  Studies Personally Reviewed - (if available, images/films reviewed: From Epic Chart or Care Everywhere)  Echo ([facility]) 03/06/2017: Mildly reduced EF of 45-50%.  Mild MR and TR.  Severe LA dilation.  Moderate RA dilation.  Mild to moderately elevated PA pressures (42 mm).  Unable to assess diastolic function due to A. fib.  No obvious wall motion normality.  (Difficult to assess because of A. fib.  Myoview March 19, 2017 (here) - was in Afib ~ RVR.-EF 23%.  Global hypokinesis.  (High risk as a result).  2 defects noted: Small size severe defect in the basal inferolateral and mid inferolateral wall consistent with prior MI.  Small sized moderate severity defect in the mid anterior and apical anterior wall consistent with prior infarct with mild peri-infarct ischemia.   Myoview from May 2013: Moderate perfusion defect in the basal inferior and basal  inferolateral wall consistent with infarct/scar with mild peri-infarct ischemia.  There is also a moderate defect in the mid anterior and apical anterior wall consistent with infarct/scar.  EF 52%.  (LOW risk.  However inferior wall defect appeared to be new.)  Interval History: "Erik Meyer" presents here today for follow-up with quite a bit of data to review. He has had quite the exciting turn of events leading from the end of October to now.  We also talked about the results of his stress test especially with him having some early morning chest pain episodes.  He does say however that he is not having any while at the gym doing exercise.  He does not feel his heart rate beating irregular at rest, but whenever he does anything his heart rate will increase then he notes feeling short of breath.  If he then tries to go further than that he will have some discomfort in his chest.  But not routinely. Over the last few visits he has had medications adjusted with amiodarone being started.  That seems to have helped his overall symptoms.  He is breathing a little bit better and noting his heart rate being a bit better.  He is also diuresed a little bit and is now feeling a little bit back to his baseline weight. He really has not noticed any PND or orthopnea.  He has had some edema but this seems to be better now.  He  is now back to low-dose Lasix.  He denies any syncope or near syncope, TIA or amaurosis fugax symptoms.  This did not need to be added.  He was already anticoagulated with warfarin for his valve, therefore  He brings with him his blood pressure and heart rate recordings.  Things were looking great in July and August however it was late October into early November when he started noticing.  He had rates up into the 150s.  Unfortunately, the day he went for a stress test he was in A. fib.  No claudication.  ROS: A comprehensive was performed. Review of Systems  Constitutional: Positive for  malaise/fatigue.  HENT: Negative for nosebleeds.   Respiratory: Negative for cough, shortness of breath (Notably improving.  Return to baseline.) and wheezing.   Cardiovascular:       Per HPI  Gastrointestinal: Negative for abdominal pain, blood in stool, heartburn, melena and nausea.  Musculoskeletal: Positive for back pain.  Neurological: Positive for dizziness. Negative for focal weakness, seizures and loss of consciousness.  Endo/Heme/Allergies: Negative for environmental allergies. Does not bruise/bleed easily.  Psychiatric/Behavioral: Negative for depression and memory loss. The patient is not nervous/anxious.   All other systems reviewed and are negative.  I have reviewed and (if needed) personally updated the patient's problem list, medications, allergies, past medical and surgical history, social and family history.   Past Medical History:  Diagnosis Date  . Anemia   . CAD, multiple vessel 2004   Found during preop cath for [facility] AVR procedure; Myoview 5/'13: No ischemia or infarct, EF 53%  . COPD (chronic obstructive pulmonary disease) ([place])   . Diverticulosis   . Dyslipidemia, goal LDL below 70    Monitored by PCP. On statin (last labs scan from September 2015: TC 149, TG 127, HDL 55, LDL 69)  . ED (erectile dysfunction)   . GERD (gastroesophageal reflux disease)   . History of SBO (small bowel obstruction) March 2015   Abdominal surgery with LOA  . Hx of irritable bowel syndrome   . Hypertension, essential   . Lesion of right native kidney  08/20/2013   Abdominal US : 2.4 cm x 1.7 complex lesion in the upper pole the right kidney in March of 2012 concerning for RCC although enlarging hemorrhagic cyst could have this appearance as well.,   . Monoclonal gammopathy of undetermined significance   . Psoriasis   . S/P AVR (aortic valve replacement) and aortoplasty 2004   Dr. Cyndia BentDeneen Harts procedure - St. Jude aVR (25 mm prosthesis) with aortic root conduit;; Echo 10/'14:  EF 55-60%, Mod Conc LVH, Gr 1 DD, Well Seated AoV Mech Prosthesis with normal P gradients (no stenosis), Mod-Severe LA dilation --> follow-up echo November 2016: Well functioning mechanical valve. Paradoxical septal motion. EF 50-55%. Severe LA dilation. Moderate RA dilation.   . S/P CABG x 4 2004   LIMA-LAD, SVG-D1, SVG- PDA-PLA (along with AVR)    Past Surgical History:  Procedure Laterality Date  . ABDOMINAL AORTIC ANEURYSM REPAIR    . AORTIC VALVE REPLACEMENT    . ASD REPAIR  12/26/2002   Aortic valve replacement and replacement of aortic root aneurysm using a 25 mm St. JUDE mechanical valve cinduit with reimplantation of the coronary arteeries  . CARDIAC CATHETERIZATION  Aug  13,2004   diffuse  coronary disease,prior to his BENTALL PROCEDURE  . CORONARY ARTERY BYPASS GRAFT  12/26/2002   LIMA to LAD,SVG to diagonal, SVG to PDA and PLA.   Marland Kitchen LAPAROSCOPIC LYSIS  OF ADHESIONS  March 2015   performed in [place] for SBO   . NM MYOVIEW LTD  10/04/2011   showed no ischemia;EF52%  . PARTIAL COLECTOMY  1994  . TRANSTHORACIC ECHOCARDIOGRAM  October 2013   CW doppler gradient of 2.47m/ second across the aortic valve with a mean  gradient of 12 ,which was normal for valve, normal RSVP ANDSYSTOLIC FUNCTION WIYH DIASTOLIC RELAXATION ABNORMALITY  . TRANSTHORACIC ECHOCARDIOGRAM  October 2014   EF 55-60%. Mild concentric LVH;  Grade 1 Diastolic Dysfunction.; St. Jude familiar mechanical prosthesis well seated. No evidence of stenosis. Gradients normal for  prosthesis; moderate to severely dilated left atrium.   . TRANSTHORACIC ECHOCARDIOGRAM  2014; 03/2015   Echo 10/'14: EF 55-60%, Mod Conc LVH, Gr 1 DD, Well Seated AoV Mech Prosthesis with normal P gradients (no stenosis), Mod-Severe LA dilation --> follow-up echo November 2016: Well functioning mechanical valve. Paradoxical septal motion. EF 50-55%. Severe LA dilation. Moderate RA dilation.   . VENTRAL HERNIA REPAIR     repair with mesh by Dr Collene Mares  at [place]  Medication Sig  . amiodarone (PACERONE) 200 MG tablet Take 1 tablet (200 mg total) by mouth 2 (two) times daily.  Marland Kitchen ampicillin (PRINCIPEN) 500 MG capsule Take 500 mg by mouth daily.   Marland Kitchen aspirin 81 MG tablet Take 81 mg by mouth daily.  . DENTA 5000 PLUS 1.1 % CREA dental cream Place 1 Squirt onto teeth daily.  . furosemide (LASIX) 20 MG tablet Take 20 mg daily by mouth.  . IRON PO Take 325 mg by mouth 2 (two) times daily.   . metoprolol succinate (TOPROL-XL) 50 MG 24 hr tablet Take 1 tablet (50 mg total) by mouth daily. Take with or immediately following a meal.  . potassium chloride SA (K-DUR,KLOR-CON) 20 MEQ tablet Take 1 tablet twice a day for 2 days then reduce to 1 tablet daily.  . simvastatin (ZOCOR) 20 MG tablet TAKE 1 TABLET AT BEDTIME  . tamsulosin (FLOMAX) 0.4 MG CAPS capsule Take 0.4 mg by mouth daily.  . valsartan-hydrochlorothiazide (DIOVAN-HCT) 320-12.5 MG tablet TAKE 1 TABLET EVERY DAY  . warfarin (COUMADIN) 5 MG tablet Take 1 and 1/2 tablets to 2 tablets daily as directed by coumadin clinic (Patient taking differently: Take by mouth. 10 mg Sunday Tuesday Thursday. 7.5 all other days)    Allergies  Allergen Reactions  . Doxycycline Rash    Social History   Socioeconomic History  . Marital status: Married    Spouse name: None  . Number of children: 0  . Years of education: None  . Highest education level: None  Social Needs  . Financial resource strain: None  . Food insecurity - worry: None  . Food insecurity - inability: None  . Transportation needs - medical: None  . Transportation needs - non-medical: None  Occupational History    Comment: Retired  Tobacco Use  . Smoking status: Former Smoker    Last attempt to quit: 06/16/1997    Years since quitting: 19.7  . Smokeless tobacco: Never Used  Substance and Sexual Activity  . Alcohol use: Yes  . Drug use: No  . Sexual activity: None  Other Topics Concern  . None  Social History  Narrative   Married gentleman with no children. Quit smoking in 1998.   Education: 2 years of college. He does drink caffeine.  He drinks social beer.   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 Claiborne Billings in [place]; GI: Dr. Janus Molder in [place]    family history includes COPD in his sister; Colon cancer in his mother; Congestive Heart Failure in his mother; Lung cancer in his father.  Wt Readings from Last 3 Encounters:  03/23/17 213 lb (96.6 kg)  03/21/17 216 lb (98 kg)  03/13/17 211 lb 12.8 oz (96.1 kg)    PHYSICAL EXAM BP (!) 148/68   Pulse 92   Ht 6' (1.829 m)   Wt 213 lb (96.6 kg)   BMI 28.89 kg/m  Physical Exam  Constitutional: He is oriented to person, place, and time.  Neck: Normal range of motion. Neck supple. No hepatojugular reflux and no JVD (Mildly elevated with -A. fib waves.) present. Carotid bruit is not present. No thyromegaly present.  Cardiovascular: Normal rate and normal pulses. An irregularly irregular rhythm present. PMI is displaced (Slightly laterally displaced). Exam reveals gallop and S4 (Soft). Exam reveals no friction rub and no midsystolic click.  Murmur heard.  Medium-pitched musical crescendo-decrescendo midsystolic murmur is present with a grade of 2/6 at the upper right sternal border and upper left sternal border radiating to the neck. Mechanical S2  Pulmonary/Chest: Effort normal and breath sounds normal. No respiratory distress. He has no wheezes. He has no rales.  Abdominal: Soft. Bowel sounds are normal. He exhibits no distension. There is no tenderness. There is no rebound.  Musculoskeletal: Normal range of motion. He exhibits edema (R>L 1+-Tr LE edema mild spider veins/ varicosities. RLE stasis dermatitis.).  Neurological: He is alert and oriented to person, place, and time.  Skin:  Skin is warm and dry. No erythema.  Psychiatric: He has a normal mood and affect. His behavior is normal. Judgment and thought content normal.    Adult ECG Report No EKG today   Other studies Reviewed: Additional studies/ records that were reviewed today include:  Recent Labs:   Lab Results  Component Value Date   CREATININE 1.12 03/13/2017   BUN 18 03/13/2017   NA 131 (L) 03/13/2017   K 3.2 (L) 03/13/2017   CL 94 (L) 03/13/2017   CO2 28 03/13/2017   No results found for: CHOL, HDL, LDLCALC, LDLDIRECT, TRIG, CHOLHDL Lab Results  Component Value Date   WBC 9.1 03/04/2017   HGB 12.9 (L) 03/04/2017   HCT 37.4 (L) 03/04/2017   MCV 100.0 03/04/2017   PLT 176 03/04/2017    ASSESSMENT / PLAN: Problem List Items Addressed This Visit    Abnormal nuclear stress test    The more look at these images, it really seems like to studies from 2013 and the current study are relatively similar.  The concerning features at the EF is noted to be somewhat slower.  The read was low risk in 2013, and now high risk partially because of the reduced ejection fraction.  In the absence of any symptoms, I would probably consider this to be a simple difference in how the study is read.  However with new onset A. fib, and chest pain, I think the best option is to proceed with cardiac catheterization. We will wait until post cardioversion in order to avoid interrupting anticoagulation.      Relevant Orders   LEFT HEART CATHETERIZATION WITH CORONARY/GRAFT ANGIOGRAM   Acute diastolic heart failure ([place])    He had acute diastolic heart failure in  setting of A. fib.  Seems to have resolved.  He is now on standing dose of Lasix. We discussed sliding scale dosing as well. He is already on an ARB and beta-blocker.      CAD, multiple vessel - Primary (Chronic)    Multivessel CAD noted at the time of his valve surgery back in 2004.  He underwent CABG at that time. He did have a Myoview in 2013 which was read  as "low risk "with an inferior defect in the basal inferior and basal inferolateral wall consistent with infarct/scar and mild peri-infarct ischemia.  Also moderate defect in the anterior and apical wall consistent with infarct or scar.  EF at that time however was read as 52%.  The current Myoview says EF is 23% now read as high risk.  It seems like the defects are still the same.  At this point with his new onset A. fib, and having chest pain with A. Fib, I think we do need to proceed with cardiac catheterization.  The fact that he is doing better now would mean that we do not have to rush to do this.  I would not want to interrupt his anticoagulation with plans for cardioversion. For now I would like to do is have him undergo cardioversion continue warfarin post cardioversion and then bridge with Lovenox after 2-3 weeks post conversion to schedule cardiac catheterization.  Otherwise plan will be to continue beta-blocker and aspirin and is along with statin. As well as not having active anginal symptoms I will hold off on additional antianginal medications such as Imdur.      Relevant Orders   LEFT HEART CATHETERIZATION WITH CORONARY/GRAFT ANGIOGRAM   Dyslipidemia, goal LDL below 70 (Chronic)    On simvastatin.  Labs followed by PCP.      Hypertension, essential (Chronic)    Blood pressure does look a little bit high today.  He is already on max dose of valsartan-HCTZ.  Could potentially increase metoprolol dose, especially since his heart rate is in 90s. For now we will monitor because he tells me at home his blood pressures look better.  He is a little bit anxious today.      Long term current use of anticoagulant therapy (Chronic)   Persistent atrial fibrillation (HCC) (Chronic)    Being followed by the A. fib clinic.  Plan is facilitated cardioversion while on amiodarone.   We will continue his current dose of metoprolol for additional rate control He clearly is symptomatic with his A.  fib and I think probably continuing amiodarone at least for short-term would be beneficial. Following cardioversion, the plan is to evaluate with cardiac catheterization to exclude an ischemic etiology for the A. fib.  Thankfully, he is doing better on amiodarone.  He was already on warfarin.      Relevant Orders   LEFT HEART CATHETERIZATION WITH CORONARY/GRAFT ANGIOGRAM   S/P AVR (aortic valve replacement) and aortoplasty (Chronic)    He had an echocardiogram done at [facility] which showed his EF being 45% (which is better than the Myoview EF of 23%). It does show biatrial enlargement which would mean that A. fib would be very difficult to control without amiodarone.  The valve seem to be working relatively well seated.      Relevant Orders   LEFT HEART CATHETERIZATION WITH CORONARY/GRAFT ANGIOGRAM   S/P CABG x 4 (Chronic)   Relevant Orders   LEFT HEART CATHETERIZATION WITH CORONARY/GRAFT ANGIOGRAM    Other Visit Diagnoses  Clotting disorder (HCC)       Relevant Orders   CBC   Pre-op testing       Relevant Orders   Protime-INR   Basic metabolic panel   CBC      Current medicines are reviewed at length with the patient today. (+/- concerns) no Med ?s The following changes have been made: see below. = Plan DCCV per Afib clinic, then will need to schedule Afib in ~2 months.  Patient Instructions  NO CHANGE WITH CURRENT MEDICATION AT PRESENT    Your physician recommends that you schedule a follow-up appointment in [place] -- 03/28/17    WILL CONTACT YOU TO SCHEDULE  LEFT HEART CATHETERIZATION WITH MORE INSTRUCTIONS.   Your physician recommends that you schedule a follow-up appointment in [place].        [facility] [address] [place] [address] Dept: [phone] Loc: [place]  03/23/2017  You are scheduled for a Cardiac Catheterization  with Dr. Glenetta Hew  WILL CONTACT WITH MORE DETAILS.  1. Please arrive at the [facility] (Main Entrance A) at [facility]: [address] [place], [place] [address]  (two hours before your procedure to ensure your preparation). Free valet parking service is available.   Special note: Every effort is made to have your procedure done on time. Please understand that emergencies sometimes delay scheduled procedures.  2. Diet: Do not eat or drink anything after midnight prior to your procedure except sips of water to take medications.  3. Labs: WILL CONTACT YOU WHEN TO GET LABS DONE  4. Medication instructions in preparation for your procedure:    WILL CONTACT IN MORE DETAIL IN REGARDS TO LOVENOX BRIDGING/STOPPING WARFARIN       On the morning of your procedure, take your Aspirin and any morning medicines NOT listed above.  You may use sips of water.  5. Plan for one night stay--bring personal belongings. 6. Bring a current list of your medications and current insurance cards. 7. You MUST have a responsible person to drive you home. 8. Someone MUST be with you the first 24 hours after you arrive home or your discharge will be delayed. 9. Please wear clothes that are easy to get on and off and wear slip-on shoes.  Thank you for allowing [place] to care for you!   -- [facility]    Studies Ordered:   Orders Placed This Encounter  Procedures  . Protime-INR  . Basic metabolic panel  . CBC  . LEFT HEART CATHETERIZATION WITH CORONARY/GRAFT Illene Silver, M.D., M.S. Interventional Cardiologist   Pager # [phone] Phone # [phone] [address] [place],  [address]

## 2017-03-25 ENCOUNTER — Encounter: Payer: Self-pay | Admitting: Cardiology

## 2017-03-26 ENCOUNTER — Encounter: Payer: Self-pay | Admitting: Cardiology

## 2017-03-26 NOTE — Assessment & Plan Note (Signed)
On simvastatin.  Labs followed by PCP.

## 2017-03-26 NOTE — Assessment & Plan Note (Signed)
He had an echocardiogram done at [facility] which showed his EF being 45% (which is better than the Myoview EF of 23%). It does show biatrial enlargement which would mean that A. fib would be very difficult to control without amiodarone.  The valve seem to be working relatively well seated.

## 2017-03-26 NOTE — Assessment & Plan Note (Signed)
The more look at these images, it really seems like to studies from 2013 and the current study are relatively similar.  The concerning features at the EF is noted to be somewhat slower.  The read was low risk in 2013, and now high risk partially because of the reduced ejection fraction.  In the absence of any symptoms, I would probably consider this to be a simple difference in how the study is read.  However with new onset A. fib, and chest pain, I think the best option is to proceed with cardiac catheterization. We will wait until post cardioversion in order to avoid interrupting anticoagulation.

## 2017-03-26 NOTE — Assessment & Plan Note (Signed)
Multivessel CAD noted at the time of his valve surgery back in 2004.  He underwent CABG at that time. He did have a Myoview in 2013 which was read as "low risk "with an inferior defect in the basal inferior and basal inferolateral wall consistent with infarct/scar and mild peri-infarct ischemia.  Also moderate defect in the anterior and apical wall consistent with infarct or scar.  EF at that time however was read as 52%.  The current Myoview says EF is 23% now read as high risk.  It seems like the defects are still the same.  At this point with his new onset A. fib, and having chest pain with A. Fib, I think we do need to proceed with cardiac catheterization.  The fact that he is doing better now would mean that we do not have to rush to do this.  I would not want to interrupt his anticoagulation with plans for cardioversion. For now I would like to do is have him undergo cardioversion continue warfarin post cardioversion and then bridge with Lovenox after 2-3 weeks post conversion to schedule cardiac catheterization.  Otherwise plan will be to continue beta-blocker and aspirin and is along with statin. As well as not having active anginal symptoms I will hold off on additional antianginal medications such as Imdur.

## 2017-03-26 NOTE — Assessment & Plan Note (Signed)
He had acute diastolic heart failure in setting of A. fib.  Seems to have resolved.  He is now on standing dose of Lasix. We discussed sliding scale dosing as well. He is already on an ARB and beta-blocker.

## 2017-03-26 NOTE — Assessment & Plan Note (Signed)
Blood pressure does look a little bit high today.  He is already on max dose of valsartan-HCTZ.  Could potentially increase metoprolol dose, especially since his heart rate is in 90s. For now we will monitor because he tells me at home his blood pressures look better.  He is a little bit anxious today.

## 2017-03-26 NOTE — Assessment & Plan Note (Signed)
Being followed by the A. fib clinic.  Plan is facilitated cardioversion while on amiodarone.   We will continue his current dose of metoprolol for additional rate control He clearly is symptomatic with his A. fib and I think probably continuing amiodarone at least for short-term would be beneficial. Following cardioversion, the plan is to evaluate with cardiac catheterization to exclude an ischemic etiology for the A. fib.  Thankfully, he is doing better on amiodarone.  He was already on warfarin.

## 2017-03-27 ENCOUNTER — Other Ambulatory Visit: Payer: Self-pay | Admitting: Cardiology

## 2017-03-28 ENCOUNTER — Ambulatory Visit (HOSPITAL_COMMUNITY)
Admission: RE | Admit: 2017-03-28 | Discharge: 2017-03-28 | Disposition: A | Discharge status: Home / Self Care | Payer: Medicare HMO | Source: Ambulatory Visit | Attending: Nurse Practitioner | Admitting: Nurse Practitioner

## 2017-03-28 ENCOUNTER — Encounter (HOSPITAL_COMMUNITY): Payer: Self-pay | Admitting: Nurse Practitioner

## 2017-03-28 VITALS — BP 132/82 | HR 138 | Ht 72.0 in | Wt 213.0 lb

## 2017-03-28 DIAGNOSIS — D472 Monoclonal gammopathy: Secondary | ICD-10-CM | POA: Insufficient documentation

## 2017-03-28 DIAGNOSIS — Z951 Presence of aortocoronary bypass graft: Secondary | ICD-10-CM | POA: Diagnosis not present

## 2017-03-28 DIAGNOSIS — J449 Chronic obstructive pulmonary disease, unspecified: Secondary | ICD-10-CM | POA: Diagnosis not present

## 2017-03-28 DIAGNOSIS — I481 Persistent atrial fibrillation: Secondary | ICD-10-CM

## 2017-03-28 DIAGNOSIS — K219 Gastro-esophageal reflux disease without esophagitis: Secondary | ICD-10-CM | POA: Insufficient documentation

## 2017-03-28 DIAGNOSIS — Z79899 Other long term (current) drug therapy: Secondary | ICD-10-CM | POA: Insufficient documentation

## 2017-03-28 DIAGNOSIS — Z9049 Acquired absence of other specified parts of digestive tract: Secondary | ICD-10-CM | POA: Diagnosis not present

## 2017-03-28 DIAGNOSIS — I4891 Unspecified atrial fibrillation: Secondary | ICD-10-CM | POA: Insufficient documentation

## 2017-03-28 DIAGNOSIS — Z881 Allergy status to other antibiotic agents status: Secondary | ICD-10-CM | POA: Insufficient documentation

## 2017-03-28 DIAGNOSIS — Z8249 Family history of ischemic heart disease and other diseases of the circulatory system: Secondary | ICD-10-CM | POA: Insufficient documentation

## 2017-03-28 DIAGNOSIS — Z7982 Long term (current) use of aspirin: Secondary | ICD-10-CM | POA: Insufficient documentation

## 2017-03-28 DIAGNOSIS — Z8679 Personal history of other diseases of the circulatory system: Secondary | ICD-10-CM | POA: Insufficient documentation

## 2017-03-28 DIAGNOSIS — Z800 Family history of malignant neoplasm of digestive organs: Secondary | ICD-10-CM | POA: Diagnosis not present

## 2017-03-28 DIAGNOSIS — D649 Anemia, unspecified: Secondary | ICD-10-CM | POA: Insufficient documentation

## 2017-03-28 DIAGNOSIS — Z801 Family history of malignant neoplasm of trachea, bronchus and lung: Secondary | ICD-10-CM | POA: Diagnosis not present

## 2017-03-28 DIAGNOSIS — I2510 Atherosclerotic heart disease of native coronary artery without angina pectoris: Secondary | ICD-10-CM | POA: Diagnosis not present

## 2017-03-28 DIAGNOSIS — L409 Psoriasis, unspecified: Secondary | ICD-10-CM | POA: Insufficient documentation

## 2017-03-28 DIAGNOSIS — I10 Essential (primary) hypertension: Secondary | ICD-10-CM | POA: Diagnosis not present

## 2017-03-28 DIAGNOSIS — Z7901 Long term (current) use of anticoagulants: Secondary | ICD-10-CM | POA: Diagnosis not present

## 2017-03-28 DIAGNOSIS — E785 Hyperlipidemia, unspecified: Secondary | ICD-10-CM | POA: Diagnosis not present

## 2017-03-28 DIAGNOSIS — Z87891 Personal history of nicotine dependence: Secondary | ICD-10-CM | POA: Insufficient documentation

## 2017-03-28 DIAGNOSIS — Z952 Presence of prosthetic heart valve: Secondary | ICD-10-CM | POA: Insufficient documentation

## 2017-03-28 DIAGNOSIS — I4819 Other persistent atrial fibrillation: Secondary | ICD-10-CM

## 2017-03-28 DIAGNOSIS — Z825 Family history of asthma and other chronic lower respiratory diseases: Secondary | ICD-10-CM | POA: Insufficient documentation

## 2017-03-28 DIAGNOSIS — K589 Irritable bowel syndrome without diarrhea: Secondary | ICD-10-CM | POA: Insufficient documentation

## 2017-03-28 DIAGNOSIS — R0789 Other chest pain: Secondary | ICD-10-CM | POA: Diagnosis not present

## 2017-03-28 LAB — PROTIME-INR
INR: 3.6 — AB (ref 0.8–1.2)
PROTHROMBIN TIME: 34.8 s — AB (ref 9.1–12.0)

## 2017-03-28 NOTE — Progress Notes (Signed)
Primary Care Physician: Olin Hauser, MD Referring Physician: [facility] ER f/u Cardiologist: Dr. Esmond Camper Erik Meyer is a 76 y.o. male with a h/o  COPD, CAD, s/p bypass and aortic valve replacement,2004, monoclonal gammopathy of undetermined significance. For the last week, he had noted dyspnea which he assumed  may have been part of his COPD. He did not notice  cough or fever. He was also been having some intermittent chest pain. He saw his PCP 10/26, who noted that he was in atrial fibrillation and advised he increase his dose of metoprolol. He was to follow-up with his cardiologist, but told to come to the ED if he felt worse. He presented to [facility] ER 10/27 with chest pain that had been constant since about 9 AM that day, and increasing dyspnea. He is anticoagulated on warfarin, and INR was 3.9 two days ago. He was cardioverted in the ER to SR but had a lot of PC's. Dr. Curt Bears was consulted and he recommended starting the pt on amiodarone 200 mg a day. However, the opt did not start drug.  He was then scheduled to be seen in the afib clinic yesterday around 3pm, but called earlier in the day and said he had return of chest pain and was going to [place] to seek treatment. He was told not to start amiodarone because he was on metoprolol and this drug was increased for better rate control and he was d/c home. He had chest/abd CT, labs with INR of 2.7, TSH, liver enzymes normal. Echo- done and showed EF 45-50 % with severely dilated  Left atrium.  He is now in the afib clinic for f/u, 10/31. He has a v rate of 106 bpm, and continues to feel chest tightness. He was diuresed at one of the visits and his weight is 213 lbs today, he states with clothes his usual weight is 215 lbs. He is also fatigued and more short of breath.  F/u in afib clinic one week after amiodarone loading started at 200 mg bid. He remains in afib but rate slower. He is still c/o chest heaviness, has stress test pending for  the 13th, called [place] line office to see if sooner available and gave pt two earlier options but he wanted to wait for the 13th test. He had baseline PFT's this am. INR pending today in preparation for cardioversion and addition of amiodarone and warfarin.   F/u in afib clinic, 11/20, he has been loading on amiodarone for several weeks now and is having weekly INR's drawn, all have been in range. Will go ahead and schedule cardioversion last week of November. He is feeling better than when he first went into afib, v rate is higher today, it was 92 bpm when he saw Dr. Ellyn Hack 11/15.Weight is stable. Dr. Ellyn Hack is planning for cardiac catherization, bridging with Lovenox, 2-3 weeks after cardioversion, with pt's most recent issues with afib/chest pain and some HR symptoms.   Today, he denies symptoms of palpitations,  orthopnea, PND, lower extremity edema, dizziness, presyncope, syncope, or neurologic sequela. The patient is tolerating medications without difficulties and is otherwise without complaint today.   Past Medical History:  Diagnosis Date  . Anemia   . CAD, multiple vessel 2004   Found during preop cath for [facility] AVR procedure; Myoview 5/'13: No ischemia or infarct, EF 53%  . COPD (chronic obstructive pulmonary disease) ([place])   . Diverticulosis   . Dyslipidemia, goal LDL below 70    Monitored  by PCP. On statin (last labs scan from September 2015: TC 149, TG 127, HDL 55, LDL 69)  . ED (erectile dysfunction)   . GERD (gastroesophageal reflux disease)   . History of SBO (small bowel obstruction) March 2015   Abdominal surgery with LOA  . Hx of irritable bowel syndrome   . Hypertension, essential   . Lesion of right native kidney  08/20/2013   Abdominal US : 2.4 cm x 1.7 complex lesion in the upper pole the right kidney in March of 2012 concerning for RCC although enlarging hemorrhagic cyst could have this appearance as well.,   . Monoclonal gammopathy of undetermined significance   .  Psoriasis   . S/P AVR (aortic valve replacement) and aortoplasty 2004   Dr. Cyndia BentDeneen Harts procedure - St. Jude aVR (25 mm prosthesis) with aortic root conduit;; Echo 10/'14: EF 55-60%, Mod Conc LVH, Gr 1 DD, Well Seated AoV Mech Prosthesis with normal P gradients (no stenosis), Mod-Severe LA dilation --> follow-up echo November 2016: Well functioning mechanical valve. Paradoxical septal motion. EF 50-55%. Severe LA dilation. Moderate RA dilation.   . S/P CABG x 4 2004   LIMA-LAD, SVG-D1, SVG- PDA-PLA (along with AVR)   Past Surgical History:  Procedure Laterality Date  . ABDOMINAL AORTIC ANEURYSM REPAIR    . AORTIC VALVE REPLACEMENT    . ASD REPAIR  12/26/2002   Aortic valve replacement and replacement of aortic root aneurysm using a 25 mm St. JUDE mechanical valve cinduit with reimplantation of the coronary arteeries  . CARDIAC CATHETERIZATION  Aug  13,2004   diffuse  coronary disease,prior to his BENTALL PROCEDURE  . CORONARY ARTERY BYPASS GRAFT  12/26/2002   LIMA to LAD,SVG to diagonal, SVG to PDA and PLA.   Marland Kitchen LAPAROSCOPIC LYSIS OF ADHESIONS  March 2015   performed in [place] for SBO   . NM MYOVIEW LTD  10/04/2011   showed no ischemia;EF52%  . PARTIAL COLECTOMY  1994  . TRANSTHORACIC ECHOCARDIOGRAM  October 2013   CW doppler gradient of 2.78m/ second across the aortic valve with a mean  gradient of 12 ,which was normal for valve, normal RSVP ANDSYSTOLIC FUNCTION WIYH DIASTOLIC RELAXATION ABNORMALITY  . TRANSTHORACIC ECHOCARDIOGRAM  October 2014   EF 55-60%. Mild concentric LVH;  Grade 1 Diastolic Dysfunction.; St. Jude familiar mechanical prosthesis well seated. No evidence of stenosis. Gradients normal for  prosthesis; moderate to severely dilated left atrium.   . TRANSTHORACIC ECHOCARDIOGRAM  2014; 03/2015   Echo 10/'14: EF 55-60%, Mod Conc LVH, Gr 1 DD, Well Seated AoV Mech Prosthesis with normal P gradients (no stenosis), Mod-Severe LA dilation --> follow-up echo November 2016:  Well functioning mechanical valve. Paradoxical septal motion. EF 50-55%. Severe LA dilation. Moderate RA dilation.   . VENTRAL HERNIA REPAIR     repair with mesh by Dr Collene Mares [facility]    Current Outpatient Medications  Medication Sig Dispense Refill  . amiodarone (PACERONE) 200 MG tablet Take 1 tablet (200 mg total) by mouth 2 (two) times daily. 60 tablet 0  . aspirin 81 MG tablet Take 81 mg by mouth daily.    . DENTA 5000 PLUS 1.1 % CREA dental cream Place 1 Squirt onto teeth daily.  6  . furosemide (LASIX) 20 MG tablet Take 20 mg daily by mouth.    . IRON PO Take 325 mg by mouth 2 (two) times daily.     . metoprolol succinate (TOPROL-XL) 50 MG 24 hr tablet Take 1 tablet (50 mg  total) by mouth daily. Take with or immediately following a meal. 90 tablet 2  . potassium chloride SA (K-DUR,KLOR-CON) 20 MEQ tablet Take 1 tablet twice a day for 2 days then reduce to 1 tablet daily. 32 tablet 3  . simvastatin (ZOCOR) 20 MG tablet TAKE 1 TABLET AT BEDTIME 90 tablet 2  . tamsulosin (FLOMAX) 0.4 MG CAPS capsule Take 0.4 mg by mouth daily.    . valsartan-hydrochlorothiazide (DIOVAN-HCT) 320-12.5 MG tablet TAKE 1 TABLET EVERY DAY 90 tablet 2  . warfarin (COUMADIN) 5 MG tablet Take 1 and 1/2 tablets to 2 tablets daily as directed by coumadin clinic (Patient taking differently: Take by mouth. 10 mg Sunday Tuesday Thursday. 7.5 all other days) 180 tablet 1  . ampicillin (PRINCIPEN) 500 MG capsule Take 500 mg by mouth daily.      No current facility-administered medications for this encounter.     Allergies  Allergen Reactions  . Doxycycline Rash    Social History   Socioeconomic History  . Marital status: Married    Spouse name: Not on file  . Number of children: 0  . Years of education: Not on file  . Highest education level: Not on file  Social Needs  . Financial resource strain: Not on file  . Food insecurity - worry: Not on file  . Food insecurity - inability: Not on file  .  Transportation needs - medical: Not on file  . Transportation needs - non-medical: Not on file  Occupational History    Comment: Retired  Tobacco Use  . Smoking status: Former Smoker    Last attempt to quit: 06/16/1997    Years since quitting: 19.7  . Smokeless tobacco: Never Used  Substance and Sexual Activity  . Alcohol use: Yes  . Drug use: No  . Sexual activity: Not on file  Other Topics Concern  . Not on file  Social History Narrative   Married gentleman with no children. Quit smoking in 1998.   Education: 2 years of college. He does drink caffeine.  He drinks social beer.   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 Claiborne Billings in [place]; GI: Dr. Janus Molder in [place]    Family History  Problem Relation Age of Onset  . Colon cancer Mother   . Congestive Heart Failure Mother        Cardiomyopathy  . Lung cancer Father        Was a chronic smoker  . COPD Sister        End-stage COPD    ROS- All systems are reviewed and negative except as per the HPI above  Physical Exam: Vitals:   03/28/17 0908  BP: 132/82  Pulse: (!) 138  Weight: 213 lb (96.6 kg)  Height: 6' (1.829 m)   Wt Readings from Last 3 Encounters:  03/28/17 213 lb (96.6 kg)  03/23/17 213 lb (96.6 kg)  03/21/17 216 lb (98 kg)    Labs: Lab Results  Component Value Date   NA 131 (L) 03/13/2017   K 3.2 (L) 03/13/2017   CL 94 (L) 03/13/2017   CO2 28 03/13/2017   GLUCOSE 108 (H) 03/13/2017   BUN 18 03/13/2017   CREATININE 1.12 03/13/2017   CALCIUM 8.8 (L) 03/13/2017   Lab Results  Component Value Date   INR 3.6 (  H) 03/27/2017   No results found for: CHOL, HDL, LDLCALC, TRIG   GEN- The patient is well appearing, alert and oriented x 3 today.   Head- normocephalic, atraumatic Eyes-  Sclera clear, conjunctiva pink Ears- hearing  intact Oropharynx- clear Neck- supple, no JVP Lymph- no cervical lymphadenopathy Lungs- Clear to ausculation bilaterally, normal work of breathing Heart-irregular rate and rhythm, 2/6 sys murmur with normal sounding  rubs or gallops, PMI not laterally displaced GI- soft, NT, ND, + BS Extremities- no clubbing, cyanosis, or edema MS- no significant deformity or atrophy Skin- no rash or lesion Psych- euthymic mood, full affect Neuro- strength and sensation are intact  EKG- atrial flutter at 138 bpm, qrs int 84 ms, qtc 478 ms( pt states that he has not been running this fast at home) Epic records reviewed  Interpretation Summary  A complete portable two-dimensional transthoracic echocardiogram with color  flow Doppler and Spectral Doppler was performed. The study was technically  difficult. The previous echo is from 2005.  The left ventricle is mildly dilated.  There is mild asymmetric left ventricular hypertrophy.  The left ventricular ejection fraction is mildly reduced (45-50%).  There is mild (1+) mitral regurgitation.  There is mild (1+) tricuspid regurgitation.  The left atrium is severely dilated.  The right atrium is moderately dilated.  Unable to adequately determine diastolic dysfunction.  Dilated inferior vena cava suggests increased right atrial pressure.  Right ventricular systolic pressure is elevated at 45mmHg, with moderate  pulmonary hypertension.            Assessment and Plan: 1. New onset symptomatic afib with RVR Successful cardioversion in [facility] ER 10/27  but early return of afib  Continue loading  amiodarone 200 mg bid and will plan for cardioversion, Friday 11/30  Continue metoprolol  50 mg a day Continue warfarin for a chadsvasc score of at least 4, continues with weekly INR's, all so far therapeutic 10/27-3.23, 11/5-2.52, 11/12- 3.6, 11/17-3.6, he will need INR 11/26 and again am of DCCV Bmet, cbc today Weigh daily, notify office if  steady increase in weight,continue lasix Avoid salt  2. Chest discomfort Has improved Possibly  from RVR Dr. Ellyn Hack is planning for cardiac cath several weeks after cardioversion, will be bridged with lovenox    He is scheduled f/u Dr. Curt Bears in [place] (as pt lives there) and can f/u there after cardioversion He will reduce amiodarone to 200 mg bid one week following cardioversion  Butch Penny C. Teng Decou, [facility] [address] [place], [place] [address] [phone]

## 2017-03-28 NOTE — Patient Instructions (Signed)
Cardioversion scheduled for Friday, November 30th  - Arrive at the [facility] and go to admitting at 9:30AM  -Do not eat or drink anything after midnight the night prior to your procedure.  - Take all your medication with a sip of water prior to arrival.  - You will not be able to drive home after your procedure.  Decrease Amiodarone to 200mg  once a day on December 7th

## 2017-03-29 ENCOUNTER — Ambulatory Visit (INDEPENDENT_AMBULATORY_CARE_PROVIDER_SITE_OTHER): Payer: Medicare HMO | Admitting: Pharmacist

## 2017-03-29 DIAGNOSIS — Z7901 Long term (current) use of anticoagulants: Secondary | ICD-10-CM | POA: Diagnosis not present

## 2017-03-29 DIAGNOSIS — Z952 Presence of prosthetic heart valve: Secondary | ICD-10-CM | POA: Diagnosis not present

## 2017-04-03 ENCOUNTER — Other Ambulatory Visit: Payer: Self-pay | Admitting: Cardiology

## 2017-04-03 ENCOUNTER — Encounter (HOSPITAL_COMMUNITY): Payer: Self-pay | Admitting: Nurse Practitioner

## 2017-04-03 ENCOUNTER — Other Ambulatory Visit: Payer: Self-pay

## 2017-04-03 ENCOUNTER — Ambulatory Visit (HOSPITAL_COMMUNITY)
Admission: RE | Admit: 2017-04-03 | Discharge: 2017-04-03 | Disposition: A | Discharge status: Home / Self Care | Payer: Medicare HMO | Source: Ambulatory Visit | Attending: Nurse Practitioner | Admitting: Nurse Practitioner

## 2017-04-03 DIAGNOSIS — I440 Atrioventricular block, first degree: Secondary | ICD-10-CM | POA: Diagnosis present

## 2017-04-03 NOTE — Progress Notes (Addendum)
Pt in for EKG since feeling like he converted on Friday to normal rhythm.  EKG to be reviewed by Roderic Palau, NP  Ekg done for pt feeling as though he went back into SR Friday PM at 7 pm. EKG confirms SR with first degree Av block at 84 bpm, PR int 312 ms, qrs int 92 ms, qtc 489 ms. Pt has noted that he has felt more short of breath,despite SR and weight is up 5 lbs. Will increase lasix to 20 mg bid and bring back in Friday am for weight, bmet and EKG. Plan on reducing amiodarone to 200 mg daily then. Will inform Dr. Ellyn Hack pt is back in SR as he was wanting to do a cardiac cath.

## 2017-04-03 NOTE — Patient Instructions (Signed)
Increase lasix to 20mg  twice a day

## 2017-04-04 ENCOUNTER — Other Ambulatory Visit (HOSPITAL_COMMUNITY): Payer: Self-pay | Admitting: Nurse Practitioner

## 2017-04-04 LAB — PROTIME-INR
INR: 4.6 — ABNORMAL HIGH (ref 0.8–1.2)
Prothrombin Time: 43.7 s — ABNORMAL HIGH (ref 9.1–12.0)

## 2017-04-04 NOTE — Progress Notes (Signed)
Great -- probably need ~4 weeks of [place] before holding for cath. Should be OK to hold & plan cath Dec 14th (my note from 11/15 will still work).  Will ask Ivin Booty to contact him & set up -- would stop Warfarin on 12/9 - needs INR check on arrival).  Erik Meyer

## 2017-04-07 ENCOUNTER — Ambulatory Visit (HOSPITAL_COMMUNITY)
Admission: RE | Admit: 2017-04-07 | Discharge: 2017-04-07 | Disposition: A | Discharge status: Home / Self Care | Payer: Medicare HMO | Source: Ambulatory Visit | Attending: Nurse Practitioner | Admitting: Nurse Practitioner

## 2017-04-07 ENCOUNTER — Other Ambulatory Visit: Payer: Self-pay

## 2017-04-07 ENCOUNTER — Ambulatory Visit (HOSPITAL_COMMUNITY): Admission: RE | Admit: 2017-04-07 | Payer: Medicare HMO | Source: Ambulatory Visit | Admitting: Cardiology

## 2017-04-07 ENCOUNTER — Encounter: Payer: Self-pay | Admitting: *Deleted

## 2017-04-07 ENCOUNTER — Ambulatory Visit (INDEPENDENT_AMBULATORY_CARE_PROVIDER_SITE_OTHER): Payer: Medicare HMO | Admitting: Pharmacist Clinician (PhC)/ Clinical Pharmacy Specialist

## 2017-04-07 ENCOUNTER — Encounter (HOSPITAL_COMMUNITY): Payer: Self-pay | Admitting: Nurse Practitioner

## 2017-04-07 ENCOUNTER — Telehealth: Payer: Self-pay | Admitting: *Deleted

## 2017-04-07 ENCOUNTER — Encounter (HOSPITAL_COMMUNITY): Admission: RE | Payer: Self-pay | Source: Ambulatory Visit

## 2017-04-07 DIAGNOSIS — I2510 Atherosclerotic heart disease of native coronary artery without angina pectoris: Secondary | ICD-10-CM

## 2017-04-07 DIAGNOSIS — Z952 Presence of prosthetic heart valve: Secondary | ICD-10-CM | POA: Diagnosis not present

## 2017-04-07 DIAGNOSIS — I481 Persistent atrial fibrillation: Secondary | ICD-10-CM

## 2017-04-07 DIAGNOSIS — R9439 Abnormal result of other cardiovascular function study: Secondary | ICD-10-CM

## 2017-04-07 DIAGNOSIS — Z79899 Other long term (current) drug therapy: Secondary | ICD-10-CM | POA: Diagnosis not present

## 2017-04-07 DIAGNOSIS — Z7901 Long term (current) use of anticoagulants: Secondary | ICD-10-CM | POA: Diagnosis not present

## 2017-04-07 DIAGNOSIS — I4819 Other persistent atrial fibrillation: Secondary | ICD-10-CM

## 2017-04-07 DIAGNOSIS — I440 Atrioventricular block, first degree: Secondary | ICD-10-CM | POA: Diagnosis not present

## 2017-04-07 DIAGNOSIS — R635 Abnormal weight gain: Secondary | ICD-10-CM | POA: Insufficient documentation

## 2017-04-07 DIAGNOSIS — I5031 Acute diastolic (congestive) heart failure: Secondary | ICD-10-CM

## 2017-04-07 LAB — BASIC METABOLIC PANEL
ANION GAP: 7 (ref 5–15)
BUN: 12 mg/dL (ref 6–20)
CALCIUM: 8.6 mg/dL — AB (ref 8.9–10.3)
CO2: 29 mmol/L (ref 22–32)
CREATININE: 1 mg/dL (ref 0.61–1.24)
Chloride: 96 mmol/L — ABNORMAL LOW (ref 101–111)
Glucose, Bld: 127 mg/dL — ABNORMAL HIGH (ref 65–99)
Potassium: 3.2 mmol/L — ABNORMAL LOW (ref 3.5–5.1)
SODIUM: 132 mmol/L — AB (ref 135–145)

## 2017-04-07 SURGERY — CARDIOVERSION
Anesthesia: General

## 2017-04-07 NOTE — Progress Notes (Addendum)
Pt in for repeat EKG/BMET.  To be reviewed by Roderic Palau, NP  F/u weight gain of 5 lbs earlier in the  week. Lasix was increased x 3 days. He states according to the scales at the  gym, weight is back to normal.  He continues in SR with first degree AV block,  at 71 bpm, pr int 312 ms, qrs int 90 ms, qtc 465 ms. Dr. Ellyn Hack is planning a cath in about a month. Last INR  4.6, but has not been address by coumadin clinic yet, send Epic message to please address. Bmet today. Pending getting est with Dr. Curt Bears.

## 2017-04-07 NOTE — Telephone Encounter (Signed)
-----   Message from Leonie Man, MD sent at 04/04/2017  9:18 PM EST -----   ----- Message ----- From: Sherran Needs, NP Sent: 04/03/2017   4:06 PM To: Leonie Man, MD  Hello Dr. Ellyn Hack, [name] was in today for EKG as he  Converted to SR on the amiodarone, cardioversion cancelled. He felt that he was about 5 lbs fluid overloaded and I increased lasix. I know you wanted to do a cardiac cath when he was back in rhythm. Butch Penny

## 2017-04-07 NOTE — Telephone Encounter (Signed)
Great -- probably need ~4 weeks of [place] before holding for cath. Should be OK to hold & plan cath Dec 14th (my note from 11/15 will still work).  Will ask Ivin Booty to contact him & set up -- would stop Warfarin on 12/9 - needs INR check on arrival).  Glenetta Hew ------------------------------------------------------------------------- Per above notation--- RN Contacted  [facility] cath lab -  Schedule left heart cath w/ cors and graft      notified patient of  Schedule procedure dec 14 , 2018 at 10 am , patient to arrive at 8 am.  verbally went over instruction. Will send written  instruction via [employer]. Patient will have labs day of procedures.    Stop warfarin 04/16/17,  CVRR-anticoag-- will contact patient in regards to lovenox bridging. Patient aware and verbalized understanding.

## 2017-04-10 ENCOUNTER — Encounter: Payer: Self-pay | Admitting: Pharmacist Clinician (PhC)/ Clinical Pharmacy Specialist

## 2017-04-10 MED ORDER — ENOXAPARIN SODIUM 100 MG/ML ~~LOC~~ SOLN: SUBCUTANEOUS | 0 refills | Status: DC

## 2017-04-10 NOTE — Telephone Encounter (Signed)
Sent patient instructions thru [place].  Patient has used enoxaparin injections in the past.

## 2017-04-16 ENCOUNTER — Other Ambulatory Visit: Payer: Self-pay | Admitting: Cardiology

## 2017-04-17 ENCOUNTER — Other Ambulatory Visit: Payer: Self-pay | Admitting: Cardiology

## 2017-04-19 NOTE — Telephone Encounter (Signed)
Rx(s) sent to pharmacy electronically.  

## 2017-04-20 ENCOUNTER — Telehealth: Payer: Self-pay

## 2017-04-20 NOTE — Telephone Encounter (Signed)
Left detailed message per DPR:  Patient contacted pre-catheterization at [facility] scheduled for:  04/21/2017 @ 1030 Verified arrival time and place:  [facility] @ 0800 Confirmed AM meds to be taken pre-cath with sip of water: Take ASA in am No lovenox tonight or tomorrow am Hold lasix, diovan and kdur tomorrow am  Left message to return call if any questions

## 2017-04-21 ENCOUNTER — Ambulatory Visit (HOSPITAL_COMMUNITY)
Admission: RE | Admit: 2017-04-21 | Discharge: 2017-04-21 | Disposition: A | Discharge status: Home / Self Care | Payer: Medicare HMO | Source: Ambulatory Visit | Attending: Cardiology | Admitting: Cardiology

## 2017-04-21 ENCOUNTER — Other Ambulatory Visit: Payer: Self-pay | Admitting: Cardiology

## 2017-04-21 ENCOUNTER — Encounter (HOSPITAL_COMMUNITY)
Admission: RE | Disposition: A | Discharge status: Home / Self Care | Payer: Self-pay | Source: Ambulatory Visit | Attending: Cardiology

## 2017-04-21 DIAGNOSIS — Z951 Presence of aortocoronary bypass graft: Secondary | ICD-10-CM | POA: Diagnosis not present

## 2017-04-21 DIAGNOSIS — Z952 Presence of prosthetic heart valve: Secondary | ICD-10-CM | POA: Diagnosis not present

## 2017-04-21 DIAGNOSIS — I480 Paroxysmal atrial fibrillation: Secondary | ICD-10-CM | POA: Diagnosis present

## 2017-04-21 DIAGNOSIS — E785 Hyperlipidemia, unspecified: Secondary | ICD-10-CM | POA: Insufficient documentation

## 2017-04-21 DIAGNOSIS — I2510 Atherosclerotic heart disease of native coronary artery without angina pectoris: Secondary | ICD-10-CM

## 2017-04-21 DIAGNOSIS — R9439 Abnormal result of other cardiovascular function study: Secondary | ICD-10-CM | POA: Diagnosis present

## 2017-04-21 DIAGNOSIS — I481 Persistent atrial fibrillation: Secondary | ICD-10-CM | POA: Insufficient documentation

## 2017-04-21 DIAGNOSIS — Z7901 Long term (current) use of anticoagulants: Secondary | ICD-10-CM | POA: Diagnosis not present

## 2017-04-21 DIAGNOSIS — I5032 Chronic diastolic (congestive) heart failure: Secondary | ICD-10-CM | POA: Diagnosis present

## 2017-04-21 DIAGNOSIS — Z87891 Personal history of nicotine dependence: Secondary | ICD-10-CM | POA: Insufficient documentation

## 2017-04-21 DIAGNOSIS — I25119 Atherosclerotic heart disease of native coronary artery with unspecified angina pectoris: Secondary | ICD-10-CM | POA: Insufficient documentation

## 2017-04-21 DIAGNOSIS — Z7982 Long term (current) use of aspirin: Secondary | ICD-10-CM | POA: Diagnosis not present

## 2017-04-21 DIAGNOSIS — J449 Chronic obstructive pulmonary disease, unspecified: Secondary | ICD-10-CM | POA: Diagnosis not present

## 2017-04-21 DIAGNOSIS — I5041 Acute combined systolic (congestive) and diastolic (congestive) heart failure: Secondary | ICD-10-CM | POA: Diagnosis not present

## 2017-04-21 DIAGNOSIS — I110 Hypertensive heart disease with heart failure: Secondary | ICD-10-CM | POA: Diagnosis not present

## 2017-04-21 DIAGNOSIS — I5031 Acute diastolic (congestive) heart failure: Secondary | ICD-10-CM | POA: Diagnosis not present

## 2017-04-21 DIAGNOSIS — Z79899 Other long term (current) drug therapy: Secondary | ICD-10-CM | POA: Diagnosis not present

## 2017-04-21 DIAGNOSIS — I25709 Atherosclerosis of coronary artery bypass graft(s), unspecified, with unspecified angina pectoris: Secondary | ICD-10-CM | POA: Diagnosis present

## 2017-04-21 DIAGNOSIS — Z7983 Long term (current) use of bisphosphonates: Secondary | ICD-10-CM | POA: Insufficient documentation

## 2017-04-21 DIAGNOSIS — I4819 Other persistent atrial fibrillation: Secondary | ICD-10-CM

## 2017-04-21 HISTORY — PX: CORONARY/GRAFT ANGIOGRAPHY: CATH118237

## 2017-04-21 LAB — BASIC METABOLIC PANEL
Anion gap: 6 (ref 5–15)
BUN: 10 mg/dL (ref 6–20)
CALCIUM: 8.6 mg/dL — AB (ref 8.9–10.3)
CO2: 27 mmol/L (ref 22–32)
CREATININE: 0.95 mg/dL (ref 0.61–1.24)
Chloride: 101 mmol/L (ref 101–111)
GFR calc Af Amer: >60 mL/min (ref >60)
GLUCOSE: 103 mg/dL — AB (ref 65–99)
Potassium: 4.4 mmol/L (ref 3.5–5.1)
Sodium: 134 mmol/L — ABNORMAL LOW (ref 135–145)

## 2017-04-21 LAB — PROTIME-INR
INR: 1.34
Prothrombin Time: 16.4 seconds — ABNORMAL HIGH (ref 11.4–15.2)

## 2017-04-21 LAB — CBC
HEMATOCRIT: 36 % — AB (ref 39.0–52.0)
Hemoglobin: 12.4 g/dL — ABNORMAL LOW (ref 13.0–17.0)
MCH: 33.7 pg (ref 26.0–34.0)
MCHC: 34.4 g/dL (ref 30.0–36.0)
MCV: 97.8 fL (ref 78.0–100.0)
PLATELETS: 110 10*3/uL — AB (ref 150–400)
RBC: 3.68 MIL/uL — ABNORMAL LOW (ref 4.22–5.81)
RDW: 13.9 % (ref 11.5–15.5)
WBC: 2.2 10*3/uL — AB (ref 4.0–10.5)

## 2017-04-21 SURGERY — CORONARY/GRAFT ANGIOGRAPHY
Anesthesia: LOCAL

## 2017-04-21 MED ORDER — LIDOCAINE HCL (PF) 1 % IJ SOLN
INTRAMUSCULAR | Status: AC
  Filled 2017-04-21: qty 30

## 2017-04-21 MED ORDER — SODIUM CHLORIDE 0.9% FLUSH: 3.0000 mL | INTRAVENOUS | Status: DC | PRN

## 2017-04-21 MED ORDER — IOPAMIDOL (ISOVUE-370) INJECTION 76%
INTRAVENOUS | Status: AC
  Filled 2017-04-21: qty 125

## 2017-04-21 MED ORDER — HEPARIN SODIUM (PORCINE) 1000 UNIT/ML IJ SOLN
INTRAMUSCULAR | Status: DC | PRN
  Administered 2017-04-21: 5000 [IU] via INTRAVENOUS

## 2017-04-21 MED ORDER — ASPIRIN 81 MG PO CHEW: 81.0000 mg | CHEWABLE_TABLET | ORAL | Status: DC

## 2017-04-21 MED ORDER — LIDOCAINE HCL (PF) 1 % IJ SOLN
INTRAMUSCULAR | Status: DC | PRN
  Administered 2017-04-21: 2 mL

## 2017-04-21 MED ORDER — SODIUM CHLORIDE 0.9 % IV SOLN: INTRAVENOUS | Status: DC

## 2017-04-21 MED ORDER — SODIUM CHLORIDE 0.9 % IV SOLN: 250.0000 mL | INTRAVENOUS | Status: DC | PRN

## 2017-04-21 MED ORDER — SODIUM CHLORIDE 0.9% FLUSH: 3.0000 mL | Freq: Two times a day (BID) | INTRAVENOUS | Status: DC

## 2017-04-21 MED ORDER — ACETAMINOPHEN 325 MG PO TABS: 650.0000 mg | ORAL_TABLET | ORAL | Status: DC | PRN

## 2017-04-21 MED ORDER — VERAPAMIL HCL 2.5 MG/ML IV SOLN
INTRAVENOUS | Status: AC
  Filled 2017-04-21: qty 2

## 2017-04-21 MED ORDER — SODIUM CHLORIDE 0.9 % WEIGHT BASED INFUSION
3.0000 mL/kg/h | INTRAVENOUS | Status: AC
  Administered 2017-04-21: 3 mL/kg/h via INTRAVENOUS

## 2017-04-21 MED ORDER — IOPAMIDOL (ISOVUE-370) INJECTION 76%
INTRAVENOUS | Status: DC | PRN
  Administered 2017-04-21: 165 mL via INTRA_ARTERIAL

## 2017-04-21 MED ORDER — AMLODIPINE BESYLATE 10 MG PO TABS: 10.0000 mg | ORAL_TABLET | Freq: Every day | ORAL | 11 refills | Status: DC

## 2017-04-21 MED ORDER — FENTANYL CITRATE (PF) 100 MCG/2ML IJ SOLN
INTRAMUSCULAR | Status: AC
  Filled 2017-04-21: qty 2

## 2017-04-21 MED ORDER — HEPARIN SODIUM (PORCINE) 1000 UNIT/ML IJ SOLN
INTRAMUSCULAR | Status: AC
  Filled 2017-04-21: qty 1

## 2017-04-21 MED ORDER — FENTANYL CITRATE (PF) 100 MCG/2ML IJ SOLN
INTRAMUSCULAR | Status: DC | PRN
Start: 2017-04-21 — End: 2017-04-21
  Administered 2017-04-21: 25 ug via INTRAVENOUS

## 2017-04-21 MED ORDER — AMLODIPINE BESYLATE 5 MG PO TABS
ORAL_TABLET | ORAL | Status: AC
  Administered 2017-04-21: 10 mg via ORAL
  Filled 2017-04-21: qty 2

## 2017-04-21 MED ORDER — ONDANSETRON HCL 4 MG/2ML IJ SOLN: 4.0000 mg | Freq: Four times a day (QID) | INTRAMUSCULAR | Status: DC | PRN

## 2017-04-21 MED ORDER — HEPARIN (PORCINE) IN NACL 2-0.9 UNIT/ML-% IJ SOLN
INTRAMUSCULAR | Status: AC
  Filled 2017-04-21: qty 1000

## 2017-04-21 MED ORDER — AMLODIPINE BESYLATE 10 MG PO TABS: 10.0000 mg | ORAL_TABLET | Freq: Every day | ORAL | Status: DC

## 2017-04-21 MED ORDER — AMLODIPINE BESYLATE 10 MG PO TABS
10.0000 mg | ORAL_TABLET | Freq: Every day | ORAL | Status: DC
  Administered 2017-04-21: 10 mg via ORAL

## 2017-04-21 MED ORDER — MIDAZOLAM HCL 2 MG/2ML IJ SOLN
INTRAMUSCULAR | Status: DC | PRN
  Administered 2017-04-21: 2 mg via INTRAVENOUS

## 2017-04-21 MED ORDER — HEPARIN (PORCINE) IN NACL 2-0.9 UNIT/ML-% IJ SOLN
INTRAMUSCULAR | Status: AC | PRN
  Administered 2017-04-21: 1000 mL

## 2017-04-21 MED ORDER — MIDAZOLAM HCL 2 MG/2ML IJ SOLN
INTRAMUSCULAR | Status: AC
  Filled 2017-04-21: qty 2

## 2017-04-21 MED ORDER — SODIUM CHLORIDE 0.9 % WEIGHT BASED INFUSION: 1.0000 mL/kg/h | INTRAVENOUS | Status: DC

## 2017-04-21 MED ORDER — VERAPAMIL HCL 2.5 MG/ML IV SOLN
INTRAVENOUS | Status: DC | PRN
  Administered 2017-04-21: 10 mL via INTRA_ARTERIAL

## 2017-04-21 SURGICAL SUPPLY — 12 items
CATH INFINITI 5FR AL1 (CATHETERS) ×2 IMPLANT
CATH INFINITI 5FR MULTPACK ANG (CATHETERS) ×2 IMPLANT
CATH LAUNCHER 5F RADR (CATHETERS) ×1 IMPLANT
CATHETER LAUNCHER 5F RADR (CATHETERS) ×2
DEVICE RAD COMP TR BAND LRG (VASCULAR PRODUCTS) ×2 IMPLANT
GLIDESHEATH SLEND A-KIT 6F 22G (SHEATH) ×2 IMPLANT
GUIDEWIRE INQWIRE 1.5J.035X260 (WIRE) ×1 IMPLANT
INQWIRE 1.5J .035X260CM (WIRE) ×2
KIT HEART LEFT (KITS) ×2 IMPLANT
PACK CARDIAC CATHETERIZATION (CUSTOM PROCEDURE TRAY) ×2 IMPLANT
TRANSDUCER W/STOPCOCK (MISCELLANEOUS) ×2 IMPLANT
TUBING CIL FLEX 10 FLL-RA (TUBING) ×2 IMPLANT

## 2017-04-21 NOTE — Interval H&P Note (Signed)
History and Physical Interval Note:  04/21/2017 12:20 PM  VIRGIL LIGHTNER  has presented today for surgery, with the diagnosis of cad, afib, cp - ABNORMAL NUCLEAR ST The various methods of treatment have been discussed with the patient and family. After consideration of risks, benefits and other options for treatment, the patient has consented to  Procedure(s): LEFT HEART CATH AND CORS/GRAFTS ANGIOGRAPHY (N/A) with possible PERCUTANEOUS CORONARY INTERVENTION as a surgical intervention .  The patient's history has been reviewed, patient examined, no change in status, stable for surgery.  I have reviewed the patient's chart and labs.  Questions were answered to the patient's satisfaction.    Cath Lab Visit (complete for each Cath Lab visit)  Clinical Evaluation Leading to the Procedure:   ACS: No.  Non-ACS:    Anginal Classification: CCS II  Anti-ischemic medical therapy: Maximal Therapy (2 or more classes of medications)  Non-Invasive Test Results: Intermediate-risk stress test findings: cardiac mortality 1-3%/year  Prior CABG: Previous CABG  Glenetta Hew

## 2017-04-21 NOTE — Discharge Instructions (Signed)

## 2017-04-24 ENCOUNTER — Encounter (HOSPITAL_COMMUNITY): Payer: Self-pay | Admitting: Cardiology

## 2017-04-26 ENCOUNTER — Ambulatory Visit (INDEPENDENT_AMBULATORY_CARE_PROVIDER_SITE_OTHER): Payer: Medicare HMO | Admitting: Cardiology

## 2017-04-26 ENCOUNTER — Encounter: Payer: Self-pay | Admitting: Cardiology

## 2017-04-26 VITALS — BP 150/73 | HR 63 | Ht 71.0 in | Wt 209.4 lb

## 2017-04-26 DIAGNOSIS — I481 Persistent atrial fibrillation: Secondary | ICD-10-CM | POA: Diagnosis not present

## 2017-04-26 DIAGNOSIS — Z952 Presence of prosthetic heart valve: Secondary | ICD-10-CM

## 2017-04-26 DIAGNOSIS — E785 Hyperlipidemia, unspecified: Secondary | ICD-10-CM | POA: Diagnosis not present

## 2017-04-26 DIAGNOSIS — I4819 Other persistent atrial fibrillation: Secondary | ICD-10-CM

## 2017-04-26 DIAGNOSIS — I25708 Atherosclerosis of coronary artery bypass graft(s), unspecified, with other forms of angina pectoris: Secondary | ICD-10-CM

## 2017-04-26 DIAGNOSIS — I10 Essential (primary) hypertension: Secondary | ICD-10-CM

## 2017-04-26 MED ORDER — ISOSORBIDE MONONITRATE ER 30 MG PO TB24: 30.0000 mg | ORAL_TABLET | Freq: Every day | ORAL | 2 refills | Status: DC

## 2017-04-26 NOTE — Patient Instructions (Signed)
Medication Instructions:  Your physician has recommended you make the following change in your medication:  1. START Isosorbide Mononitrate (Imdur)  30 mg once daily  * If you need a refill on your cardiac medications before your next appointment, please call your pharmacy. *  Labwork: None ordered  Testing/Procedures: None ordered  Follow-Up: Your physician wants you to follow-up in: 6 months with Dr. Curt Bears.  You will receive a reminder letter in the mail two months in advance. If you don't receive a letter, please call our office to schedule the follow-up appointment.  Thank you for choosing [facility]!!   Trinidad Curet, RN [phone]  Any Other Special Instructions Will Be Listed Below (If Applicable).  Isosorbide Mononitrate extended-release tablets What is this medicine? ISOSORBIDE MONONITRATE (eye soe SOR bide mon oh NYE trate) is a vasodilator. It relaxes blood vessels, increasing the blood and oxygen supply to your heart. This medicine is used to prevent chest pain caused by angina. It will not help to stop an episode of chest pain. This medicine may be used for other purposes; ask your health care provider or pharmacist if you have questions. COMMON BRAND NAME(S): Imdur, Isotrate ER What should I tell my health care provider before I take this medicine? They need to know if you have any of these conditions: -previous heart attack or heart failure -an unusual or allergic reaction to isosorbide mononitrate, nitrates, other medicines, foods, dyes, or preservatives -pregnant or trying to get pregnant -breast-feeding How should I use this medicine? Take this medicine by mouth with a glass of water. Follow the directions on the prescription label. Do not crush or chew. Take your medicine at regular intervals. Do not take your medicine more often than directed. Do not stop taking this medicine except on the advice of your doctor or health care professional. Talk to your  pediatrician regarding the use of this medicine in children. Special care may be needed. Overdosage: If you think you have taken too much of this medicine contact a poison control center or emergency room at once. NOTE: This medicine is only for you. Do not share this medicine with others. What if I miss a dose? If you miss a dose, take it as soon as you can. If it is almost time for your next dose, take only that dose. Do not take double or extra doses. What may interact with this medicine? Do not take this medicine with any of the following medications: -medicines used to treat erectile dysfunction (ED) like avanafil, sildenafil, tadalafil, and vardenafil -riociguat This medicine may also interact with the following medications: -medicines for high blood pressure -other medicines for angina or heart failure This list may not describe all possible interactions. Give your health care provider a list of all the medicines, herbs, non-prescription drugs, or dietary supplements you use. Also tell them if you smoke, drink alcohol, or use illegal drugs. Some items may interact with your medicine. What should I watch for while using this medicine? Check your heart rate and blood pressure regularly while you are taking this medicine. Ask your doctor or health care professional what your heart rate and blood pressure should be and when you should contact him or her. Tell your doctor or health care professional if you feel your medicine is no longer working. You may get dizzy. Do not drive, use machinery, or do anything that needs mental alertness until you know how this medicine affects you. To reduce the risk of dizzy or  fainting spells, do not sit or stand up quickly, especially if you are an older patient. Alcohol can make you more dizzy, and increase flushing and rapid heartbeats. Avoid alcoholic drinks. Do not treat yourself for coughs, colds, or pain while you are taking this medicine without asking  your doctor or health care professional for advice. Some ingredients may increase your blood pressure. What side effects may I notice from receiving this medicine? Side effects that you should report to your doctor or health care professional as soon as possible: -bluish discoloration of lips, fingernails, or palms of hands -irregular heartbeat, palpitations -low blood pressure -nausea, vomiting -persistent headache -unusually weak or tired Side effects that usually do not require medical attention (report to your doctor or health care professional if they continue or are bothersome): -flushing of the face or neck -rash This list may not describe all possible side effects. Call your doctor for medical advice about side effects. You may report side effects to FDA at 1-800-FDA-1088. Where should I keep my medicine? Keep out of the reach of children. Store between 15 and 30 degrees C (59 and 86 degrees F). Keep container tightly closed. Throw away any unused medicine after the expiration date. NOTE: This sheet is a summary. It may not cover all possible information. If you have questions about this medicine, talk to your doctor, pharmacist, or health care provider.  2018 Elsevier/Gold Standard (2013-02-22 14:48:19)

## 2017-04-26 NOTE — Progress Notes (Signed)
Electrophysiology Office Note   Date:  04/26/2017   ID:  JAKYLAN RON, DOB 17-Jul-1940, MRN [id number]  PCP:  Ivan Anchors, MD  Cardiologist:  Ellyn Hack Primary Electrophysiologist:  Karon Heckendorn Meredith Leeds, MD    Chief Complaint  Patient presents with  . Follow-up    Persistent Afib     History of Present Illness: BARY LIMBACH is a 76 y.o. male who is being seen today for the evaluation of atrial fibrillation at the request of Glenetta Hew. Presenting today for electrophysiology evaluation.  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  He also has atrial fibrillation treated with amiodarone.  He presented to the [facility] emergency room on 10/27 with chest pain and dyspnea.  INR was 3.9.  He was cardioverted to sinus rhythm but had PVCs.  Amiodarone was recommended but the patient did not start the drug.  He had an echo done that showed a severely dilated left atrium and an EF of 45-50%.  He was started on amiodarone in the A. fib clinic.  The day after Thanksgiving, he felt lightheaded.  He took his pulse and was back in sinus rhythm.   Today, he denies symptoms of palpitations, shortness of breath, orthopnea, PND, lower extremity edema, claudication, dizziness, presyncope, syncope, bleeding, or neurologic sequela. The patient is tolerating medications without difficulties.  Her main complaint today is of chest pain.  The pain occurs over his whole chest and down both sides.  This is not associated with exertion or improved with rest.  He has tried nitroglycerin in the past with minimal improvement.   Past Medical History:  Diagnosis Date  . Anemia   . CAD, multiple vessel 2004   Found during preop cath for [facility] AVR procedure; Myoview 5/'13: No ischemia or infarct, EF 53%  . COPD (chronic obstructive pulmonary disease) ([place])   . Diverticulosis   .  Dyslipidemia, goal LDL below 70    Monitored by PCP. On statin (last labs scan from September 2015: TC 149, TG 127, HDL 55, LDL 69)  . ED (erectile dysfunction)   . GERD (gastroesophageal reflux disease)   . History of SBO (small bowel obstruction) March 2015   Abdominal surgery with LOA  . Hx of irritable bowel syndrome   . Hypertension, essential   . Lesion of right native kidney  08/20/2013   Abdominal US : 2.4 cm x 1.7 complex lesion in the upper pole the right kidney in March of 2012 concerning for RCC although enlarging hemorrhagic cyst could have this appearance as well.,   . Monoclonal gammopathy of undetermined significance   . Psoriasis   . S/P AVR (aortic valve replacement) and aortoplasty 2004   Dr. Cyndia BentDeneen Harts procedure - St. Jude aVR (25 mm prosthesis) with aortic root conduit;; Echo 10/'14: EF 55-60%, Mod Conc LVH, Gr 1 DD, Well Seated AoV Mech Prosthesis with normal P gradients (no stenosis), Mod-Severe LA dilation --> follow-up echo November 2016: Well functioning mechanical valve. Paradoxical septal motion. EF 50-55%. Severe LA dilation. Moderate RA dilation.   . S/P CABG x 4 2004   LIMA-LAD, SVG-D1, SVG- PDA-PLA (along with AVR)   Past Surgical History:  Procedure Laterality Date  . ABDOMINAL AORTIC ANEURYSM REPAIR    . AORTIC VALVE REPLACEMENT    . ASD REPAIR  12/26/2002   Aortic valve replacement and replacement of aortic root aneurysm using a 25  mm St. JUDE mechanical valve cinduit with reimplantation of the coronary arteeries  . CARDIAC CATHETERIZATION  Aug  13,2004   diffuse  coronary disease,prior to his BENTALL PROCEDURE  . CORONARY ARTERY BYPASS GRAFT  12/26/2002   LIMA to LAD,SVG to diagonal, SVG to PDA and PLA.   Marland Kitchen CORONARY/GRAFT ANGIOGRAPHY N/A 04/21/2017   Procedure: CORONARY/GRAFT ANGIOGRAPHY;  Surgeon: Leonie Man, MD;  Location: [place] [facility];  Service: Cardiovascular;  Laterality: N/A;  . LAPAROSCOPIC LYSIS OF ADHESIONS  March 2015    performed in [place] for SBO   . NM MYOVIEW LTD  10/04/2011   showed no ischemia;EF52%  . PARTIAL COLECTOMY  1994  . TRANSTHORACIC ECHOCARDIOGRAM  October 2013   CW doppler gradient of 2.78m/ second across the aortic valve with a mean  gradient of 12 ,which was normal for valve, normal RSVP ANDSYSTOLIC FUNCTION WIYH DIASTOLIC RELAXATION ABNORMALITY  . TRANSTHORACIC ECHOCARDIOGRAM  October 2014   EF 55-60%. Mild concentric LVH;  Grade 1 Diastolic Dysfunction.; St. Jude familiar mechanical prosthesis well seated. No evidence of stenosis. Gradients normal for  prosthesis; moderate to severely dilated left atrium.   . TRANSTHORACIC ECHOCARDIOGRAM  2014; 03/2015   Echo 10/'14: EF 55-60%, Mod Conc LVH, Gr 1 DD, Well Seated AoV Mech Prosthesis with normal P gradients (no stenosis), Mod-Severe LA dilation --> follow-up echo November 2016: Well functioning mechanical valve. Paradoxical septal motion. EF 50-55%. Severe LA dilation. Moderate RA dilation.   . VENTRAL HERNIA REPAIR     repair with mesh by Dr Collene Mares at [facility]     Current Outpatient Medications  Medication Sig Dispense Refill  . amiodarone (PACERONE) 200 MG tablet Take 1 tablet (200 mg total) by mouth daily. 30 tablet 3  . amLODipine (NORVASC) 10 MG tablet TAKE 1 TABLET EVERY DAY 90 tablet 3  . ampicillin (PRINCIPEN) 500 MG capsule Take 500 mg by mouth daily.     Marland Kitchen aspirin 81 MG tablet Take 81 mg by mouth daily.    . DENTA 5000 PLUS 1.1 % CREA dental cream Place 1 Squirt onto teeth daily.  6  . enoxaparin (LOVENOX) 100 MG/ML injection Inject 1 syringe (100 mg) every 12 hours as directed 20 Syringe 0  . furosemide (LASIX) 20 MG tablet Take 20 mg daily by mouth.    . IRON PO Take 325 mg by mouth 2 (two) times daily.     . metoprolol succinate (TOPROL-XL) 50 MG 24 hr tablet TAKE 1 TABLET DAILY WITH OR IMMEDIATELY FOLLOWING A MEAL. 90 tablet 3  . potassium chloride SA (K-DUR,KLOR-CON) 20 MEQ tablet Take 1 tablet twice a day for 2 days then  reduce to 1 tablet daily. (Patient taking differently: 20 mEq 2 (two) times daily. ) 32 tablet 3  . simvastatin (ZOCOR) 20 MG tablet TAKE 1 TABLET AT BEDTIME 90 tablet 3  . tamsulosin (FLOMAX) 0.4 MG CAPS capsule Take 0.4 mg by mouth 2 (two) times daily.     . valsartan-hydrochlorothiazide (DIOVAN-HCT) 320-12.5 MG tablet TAKE 1 TABLET EVERY DAY 90 tablet 3  . warfarin (COUMADIN) 5 MG tablet Take 7.5-10 mg by mouth See admin instructions. 10 mg on Sundays and 7.5 mg all other days    . isosorbide mononitrate (IMDUR) 30 MG 24 hr tablet Take 1 tablet (30 mg total) by mouth daily. 90 tablet 2   No current facility-administered medications for this visit.     Allergies:   Doxycycline   Social History:  The patient  reports that  he quit smoking about 19 years ago. he has never used smokeless tobacco. He reports that he drinks alcohol. He reports that he does not use drugs.   Family History:  The patient's family history includes COPD in his sister; Colon cancer in his mother; Congestive Heart Failure in his mother; Lung cancer in his father.    ROS:  Please see the history of present illness.   Otherwise, review of systems is positive for chest pain.   All other systems are reviewed and negative.    PHYSICAL EXAM: VS:  BP (!) 150/73   Pulse 63   Ht 5\' 11"  (1.803 m)   Wt 209 lb 6.4 oz (95 kg)   BMI 29.21 kg/m  , BMI Body mass index is 29.21 kg/m. GEN: Well nourished, well developed, in no acute distress  HEENT: normal  Neck: no JVD, carotid bruits, or masses Cardiac: RRR; aortic valve click, no murmurs, rubs, or gallops,no edema  Respiratory:  clear to auscultation bilaterally, normal work of breathing GI: soft, nontender, nondistended, + BS MS: no deformity or atrophy  Skin: warm and dry Neuro:  Strength and sensation are intact Psych: euthymic mood, full affect  EKG:  EKG is ordered today. Personal review of the ekg ordered shows sinus rhythm, rate 63  Recent Labs: 04/21/2017:  BUN 10; Creatinine, Ser 0.95; Hemoglobin 12.4; Platelets 110; Potassium 4.4; Sodium 134    Lipid Panel  No results found for: CHOL, TRIG, HDL, CHOLHDL, VLDL, LDLCALC, LDLDIRECT   Wt Readings from Last 3 Encounters:  04/26/17 209 lb 6.4 oz (95 kg)  04/21/17 215 lb (97.5 kg)  04/07/17 214 lb 12.8 oz (97.4 kg)      Other studies Reviewed: Additional studies/ records that were reviewed today include: TTE 03/22/17  Review of the above records today demonstrates:  - Left ventricle: The cavity size was mildly dilated. There was   mild focal basal hypertrophy of the septum. Systolic function was   normal. The estimated ejection fraction was in the range of 50%   to 55%. Although no diagnostic regional wall motion abnormality   was identified, this possibility cannot be completely excluded on   the basis of this study. Doppler parameters are consistent with   abnormal left ventricular relaxation (grade 1 diastolic   dysfunction). - Ventricular septum: Septal motion showed paradox. These changes   are consistent with a post-thoracotomy state. - Aortic valve: A mechanical prosthesis was present and functioning   normally. - Mitral valve: Calcified annulus. - Left atrium: The atrium was severely dilated. - Right atrium: The atrium was moderately dilated. - Atrial septum: No defect or patent foramen ovale was identified.  LHC 04/21/17  Prox LAD to Mid LAD lesion is 65% stenosed.  Mid LAD-1 lesion is 65% stenosed. Mid LAD-2 lesion is 100% stenosed. There is competitive flow.  LIMA-dLAD is moderate in size.  SVG-Diag2 graft was visualized by angiography and is large. Origin lesion is 100% stenosed.  Ost RPDA to RPDA lesion is 100% stenosed.  Seq SVG- rPDA-PL graft was visualized by angiography and is very large. Origin lesion before RPDA is 99% stenosed. Prox Graft to Insertion lesion before RPDA is 100% stenosed.   ASSESSMENT AND PLAN:  1.  Persistent atrial fibrillation:  Currently on amiodarone and warfarin.  Also takes metoprolol.  Unfortunately he has a severely dilated left atrium.  He is in sinus rhythm today.  He has noted no further episodes of atrial fibrillation since Thanksgiving.  We Zaid Tomes continue  with current therapy. This patients CHA2DS2-VASc Score and unadjusted Ischemic Stroke Rate (% per year) is equal to 3.2 % stroke rate/year from a score of 3  Above score calculated as 1 point each if present [CHF, HTN, DM, Vascular=MI/PAD/Aortic Plaque, Age if 65-74, or Male] Above score calculated as 2 points each if present [Age > 75, or Stroke/TIA/TE]  2.  Coronary artery disease status post CABG with chronic stable angina: Pain most recently has occurred during atrial fibrillation with rapid rates.  Most recent heart catheterization without major issue as compared to prior and decision made for heart cath with stable disease from prior catheterizations.  He is continuing to have chest discomfort.  We Tobin Witucki start him on imdur 30 mg today.    3.  Hypertension: Blood pressure is mildly elevated today but has been well controlled at home.  No changes.  4.  Hyperlipidemia: Continue statin  5.  Aortic valve replacement: Functioning appropriately on most recent echo.  Exam without major abnormality.  No changes.  Current medicines are reviewed at length with the patient today.   The patient does not have concerns regarding his medicines.  The following changes were made today:  imdur 30 mg  Labs/ tests ordered today include:  Orders Placed This Encounter  Procedures  . EKG 12-Lead     Disposition:   FU with Brittin Belnap 6 months  Signed, Anye Brose Meredith Leeds, MD  04/26/2017 3:02 PM     [place] [address] [place] [address] [phone] (office) [phone] (fax)

## 2017-04-27 ENCOUNTER — Other Ambulatory Visit: Payer: Self-pay | Admitting: Cardiology

## 2017-04-28 ENCOUNTER — Telehealth: Payer: Self-pay | Admitting: Cardiology

## 2017-04-28 LAB — PROTIME-INR
INR: 3.5 — ABNORMAL HIGH (ref 0.8–1.2)
PROTHROMBIN TIME: 33.5 s — AB (ref 9.1–12.0)

## 2017-04-28 NOTE — Telephone Encounter (Signed)
Pt called to asked about  ISOSORBIDE 30mg .   His BP dropped to 108/53 9am today  106/49 11:12a.    Please give him a call back he just started this med yesterday.  He has some questions about it.

## 2017-04-28 NOTE — Telephone Encounter (Signed)
°  New Prob  Pt c/o BP issue: STAT if pt c/o blurred vision, one-sided weakness or slurred speech  1. What are your last 5 BP readings? 108/53 (this morning); 106/49 (11:00 A this AM)  2. Are you having any other symptoms (ex. Dizziness, headache, blurred vision, passed out)? No  3. What is your BP issue? States BP has dropped since starting isosorbide mononitrate (IMDUR) 30 MG 24 hr tablet.

## 2017-04-28 NOTE — Telephone Encounter (Signed)
Returned call to patient Dr.Camnitz's recommendation given.

## 2017-04-28 NOTE — Telephone Encounter (Signed)
Returned call to patient he stated since he started taking Isosorbide 30 mg daily B/P has been lower 108/53,106/49 pulse 60's.Stated chest pain a lot better.He wanted to make sure B/P not too low.Advised I will send message to [facility].

## 2017-04-28 NOTE — Telephone Encounter (Signed)
Continue medications if he is feeling well. No issues with BP if he has no symptoms.

## 2017-05-01 ENCOUNTER — Ambulatory Visit (INDEPENDENT_AMBULATORY_CARE_PROVIDER_SITE_OTHER): Payer: Medicare HMO | Admitting: Pharmacist Clinician (PhC)/ Clinical Pharmacy Specialist

## 2017-05-01 DIAGNOSIS — Z952 Presence of prosthetic heart valve: Secondary | ICD-10-CM | POA: Diagnosis not present

## 2017-05-01 DIAGNOSIS — Z7901 Long term (current) use of anticoagulants: Secondary | ICD-10-CM | POA: Diagnosis not present

## 2017-05-01 DIAGNOSIS — I481 Persistent atrial fibrillation: Secondary | ICD-10-CM

## 2017-05-01 DIAGNOSIS — I4819 Other persistent atrial fibrillation: Secondary | ICD-10-CM

## 2017-05-01 NOTE — Telephone Encounter (Signed)
See other telephone note from today for documentation of addressing below concern.

## 2017-05-07 ENCOUNTER — Encounter: Payer: Self-pay | Admitting: Cardiology

## 2017-05-08 ENCOUNTER — Other Ambulatory Visit (HOSPITAL_COMMUNITY): Payer: Self-pay | Admitting: *Deleted

## 2017-05-08 ENCOUNTER — Telehealth: Payer: Self-pay | Admitting: Cardiology

## 2017-05-08 MED ORDER — POTASSIUM CHLORIDE CRYS ER 20 MEQ PO TBCR: 20.0000 meq | EXTENDED_RELEASE_TABLET | Freq: Two times a day (BID) | ORAL | 0 refills | Status: DC

## 2017-05-08 NOTE — Telephone Encounter (Signed)
Good plan. - if Sx persist - may need to check EKG to exclude recurrent Afib.  [place]

## 2017-05-08 NOTE — Telephone Encounter (Signed)
Spoke with patient and advised, stated he did not feel like he was in Afib. Will call back if any changes before follow up next week

## 2017-05-08 NOTE — Telephone Encounter (Signed)
Pt c/o of Chest Pain: STAT if CP now or developed within 24 hours  1. Are you having CP right now? yes  2. Are you experiencing any other symptoms (ex. SOB, nausea, vomiting, sweating)?  no  3. How long have you been experiencing CP? This past  Saturday Sunday and today   4. Is your CP continuous or coming and going? Continuous   5. Have you taken Nitroglycerin? Yes  ?

## 2017-05-08 NOTE — Telephone Encounter (Signed)
Spoke with patient and he has been having chest pains since Saturday. Chest pain is not made worse with exertion and is mostly constant. This has been an ongoing issue since October. He was given Imdur 30 mg daily and pains improved until Saturday. He denies shortness of breath, nausea, or any other symptoms. Systolic blood pressure has been running 110-130.  Advised to increase Imdur to 60 mg, keep appointment Monday, and will send to Dr Ellyn Hack for further review.

## 2017-05-10 ENCOUNTER — Encounter: Payer: Self-pay | Admitting: Cardiology

## 2017-05-15 ENCOUNTER — Other Ambulatory Visit: Payer: Self-pay | Admitting: Cardiology

## 2017-05-15 ENCOUNTER — Ambulatory Visit: Payer: Medicare HMO | Admitting: Cardiology

## 2017-05-15 ENCOUNTER — Encounter: Payer: Self-pay | Admitting: Cardiology

## 2017-05-15 ENCOUNTER — Ambulatory Visit (INDEPENDENT_AMBULATORY_CARE_PROVIDER_SITE_OTHER): Payer: Medicare HMO | Admitting: Pharmacist

## 2017-05-15 VITALS — BP 114/60 | HR 60 | Ht 71.0 in | Wt 210.6 lb

## 2017-05-15 DIAGNOSIS — I10 Essential (primary) hypertension: Secondary | ICD-10-CM

## 2017-05-15 DIAGNOSIS — Z952 Presence of prosthetic heart valve: Secondary | ICD-10-CM

## 2017-05-15 DIAGNOSIS — R9439 Abnormal result of other cardiovascular function study: Secondary | ICD-10-CM

## 2017-05-15 DIAGNOSIS — I2510 Atherosclerotic heart disease of native coronary artery without angina pectoris: Secondary | ICD-10-CM

## 2017-05-15 DIAGNOSIS — E785 Hyperlipidemia, unspecified: Secondary | ICD-10-CM

## 2017-05-15 DIAGNOSIS — I4819 Other persistent atrial fibrillation: Secondary | ICD-10-CM

## 2017-05-15 DIAGNOSIS — R200 Anesthesia of skin: Secondary | ICD-10-CM

## 2017-05-15 DIAGNOSIS — Z7901 Long term (current) use of anticoagulants: Secondary | ICD-10-CM

## 2017-05-15 DIAGNOSIS — R0789 Other chest pain: Secondary | ICD-10-CM

## 2017-05-15 DIAGNOSIS — R202 Paresthesia of skin: Secondary | ICD-10-CM

## 2017-05-15 DIAGNOSIS — I5031 Acute diastolic (congestive) heart failure: Secondary | ICD-10-CM

## 2017-05-15 DIAGNOSIS — I481 Persistent atrial fibrillation: Secondary | ICD-10-CM | POA: Diagnosis not present

## 2017-05-15 LAB — POCT INR: INR: 3.3

## 2017-05-15 NOTE — Patient Instructions (Signed)
MEDICATIONS INSTRUCTIONS  TAKE ISOSORBIDE  MN 60 MG ( ALL) TO MORNING TAKE AMIODARONE IN THE MORNING   TAKE AMLODIPINE IN THE EVENING TAKE METOPROLOL IN THE EVENING    SCHEDULE AT [address] Your physician has requested that you have a upper extremity arterial duplex. This test is an ultrasound of the arteries in the arms. It looks at arterial blood flow in the  arms. Allow one hour for  Upper Arterial scans. There are no restrictions or special instructions     Your physician recommends that you schedule a follow-up appointment in 3-4 MONTHS WITH DR HARDING.   If you need a refill on your cardiac medications before your next appointment, please call your pharmacy.

## 2017-05-15 NOTE — Progress Notes (Signed)
PCP: Ivan Anchors, MD  Clinic Note: Chief Complaint  Patient presents with  . Chest Pain    past couple months, pt deneid SOB and swelling in legs  . Coronary Artery Disease    Post cath follow-up  . Atrial Fibrillation    HPI: Erik Meyer is a 77 y.o. male with a PMH below who presents today for what amounts to be post cath follow-up with history of CAD and A. Fib complicating cardiomyopathy.Erik Meyer was last seen on November 15 to discuss the results of his Myoview stress test.  We then decided for him to go forward with cardioversion and then cardiac catheterization to evaluate his positive stress test.   Recent Hospitalizations: Cath 04/11/17  Studies Personally Reviewed - (if available, images/films reviewed: From Epic Chart or Care Everywhere) CORONARY/GRAFT ANGIOGRAPHY - 04/11/2017  Conclusion  Prox LAD to Mid LAD lesion is 65% stenosed. Mid LAD-1 lesion is 65% stenosed. Mid LAD-2 lesion is 100% stenosed. There is competitive flow.  LIMA-dLAD is moderate in size.  SVG-Diag2 graft - Origin lesion is 100% stenosed.  Ost RPDA to RPDA lesion is 100% stenosed.  Seq SVG- rPDA-PL graft was visualized by angiography and is very large. Origin lesion before RPDA is 99% stenosed. Prox Graft to Insertion lesion before RPDA is 100% stenosed. Severe native CAD involving mostly the LAD.  Unfortunately the grafted PDA is now occluded along with the sequential SVG-PDA-PL.   Not unexpectedly, the SVG-Diag1 is occluded.  The findings correlate directly with the stress test findings of inferior infarct and anterior infarct.  In the absence of ongoing anginal symptoms, I would not attempt to try PCI of the chronically occluded PDA. The minimal segment of LAD compromising a septal perforator has not changed since bypass. Likely not a major issue.  I suspect that the occluded grafts were from prior to 2013. Nothing appears to be acute.  Plan:  Medical management including  restarting amlodipine.   Diagnostic Diagram      He was seen by Dr. Curt Bears on December 19 for A. fib follow-up.  At that time apparently he denied any shortness of breath or palpitation symptoms.  He noted bilateral chest pain up and down both sides not necessarily associate with exertion.  Interval History: Erik Meyer presents today still noticing off and on chest pain similar to when he saw Dr. Curt Bears.  It is not necessarily associated with any particular activity or exertion.  Not associated with dyspnea.  He denies that he has anything associated with what rhythm he is in.  He is however able to walk around in [place] without having any chest tightness or pressure.  He has less energy than before, does get a little bit of exertional dyspnea.  He has not yet gone to the gym.  He does indicate that the chest discomfort has not happens frequently since he was started on Imdur, but he does not noticed that the symptoms improved with nitroglycerin.  He has not had any sensation of recurrence of A. fib.  No real PND or orthopnea.  Minimal edema...  He has noted a difference in blood pressure with left pressure being greater than right.  No lightheadedness, dizziness, weakness or syncope/near syncope. No TIA/amaurosis fugax symptoms. No melena, hematochezia, hematuria, or epstaxis. No claudication.  ROS: A comprehensive was performed. Review of Systems  Constitutional: Negative for diaphoresis and malaise/fatigue.  Genitourinary: Negative for hematuria.  Musculoskeletal: Positive for joint pain. Negative for falls and  myalgias.  Neurological: Negative for dizziness.  Psychiatric/Behavioral: Negative for depression and memory loss. The patient is not nervous/anxious and does not have insomnia.   All other systems reviewed and are negative.   I have reviewed and (if needed) personally updated the patient's problem list, medications, allergies, past medical and surgical history, social and  family history.   Past Medical History:  Diagnosis Date  . Anemia   . CAD, multiple vessel 2004   Found during preop cath for [facility] AVR procedure; Myoview 5/'13: No ischemia or infarct, EF 53% --> cath December 2018: Proximal LAD 65% followed by mid LAD 65% then 100% occlusion.  Patent LIMA-LAD.  SVG-D2 occluded.  RPDA occluded. Seq SVG-r PDA-PL patent to PDA, but the PDA is occluded as is the proximal native PDA  . COPD (chronic obstructive pulmonary disease) ([place])   . Diverticulosis   . Dyslipidemia, goal LDL below 70    Monitored by PCP. On statin (last labs scan from September 2015: TC 149, TG 127, HDL 55, LDL 69)  . ED (erectile dysfunction)   . GERD (gastroesophageal reflux disease)   . History of SBO (small bowel obstruction) March 2015   Abdominal surgery with LOA  . Hx of irritable bowel syndrome   . Hypertension, essential   . Lesion of right native kidney  08/20/2013   Abdominal US : 2.4 cm x 1.7 complex lesion in the upper pole the right kidney in March of 2012 concerning for RCC although enlarging hemorrhagic cyst could have this appearance as well.,   . Monoclonal gammopathy of undetermined significance   . Psoriasis   . S/P AVR (aortic valve replacement) and aortoplasty 2004   Dr. Cyndia BentDeneen Harts procedure - St. Jude aVR (25 mm prosthesis) with aortic root conduit;; Echo 10/'14: EF 55-60%, Mod Conc LVH, Gr 1 DD, Well Seated AoV Mech Prosthesis with normal P gradients (no stenosis), Mod-Severe LA dilation --> follow-up echo November 2016: Well functioning mechanical valve. Paradoxical septal motion. EF 50-55%. Severe LA dilation. Moderate RA dilation.   . S/P CABG x 4 2004   LIMA-LAD, SVG-D1, SVG- PDA-PLA (along with AVR)    Past Surgical History:  Procedure Laterality Date  . ABDOMINAL AORTIC ANEURYSM REPAIR    . AORTIC VALVE REPLACEMENT    . ASD REPAIR  12/26/2002   Aortic valve replacement and replacement of aortic root aneurysm using a 25 mm St. JUDE mechanical  valve cinduit with reimplantation of the coronary arteeries  . CARDIAC CATHETERIZATION  Aug  13,2004   diffuse  coronary disease,prior to his BENTALL PROCEDURE  . CORONARY ARTERY BYPASS GRAFT  12/26/2002   LIMA to LAD,SVG to diagonal, SVG to PDA and PLA.   Marland Kitchen CORONARY/GRAFT ANGIOGRAPHY N/A 04/21/2017   Procedure: CORONARY/GRAFT ANGIOGRAPHY;  Surgeon: Leonie Man, MD;  Location: [facility] INVASIVE CV LAB: p & mLAD 65% then m100%. Patent LIMA-LAD. 100% SVG-D2 (D1 & D2 fill via native flow. RCA-RPAV patent, 100% ostrPDA, but SVG-rPDA-RPL patent to PDA then occluded.  Marland Kitchen LAPAROSCOPIC LYSIS OF ADHESIONS  March 2015   performed in [place] for SBO   . NM MYOVIEW LTD  10/04/2011   showed no ischemia;EF52%  . PARTIAL COLECTOMY  1994  . TRANSTHORACIC ECHOCARDIOGRAM  October 2013   CW doppler gradient of 2.43m/ second across the aortic valve with a mean  gradient of 12 ,which was normal for valve, normal RSVP ANDSYSTOLIC FUNCTION WIYH DIASTOLIC RELAXATION ABNORMALITY  . TRANSTHORACIC ECHOCARDIOGRAM  October 2014   EF 55-60%. Mild concentric  LVH;  Grade 1 Diastolic Dysfunction.; St. Jude familiar mechanical prosthesis well seated. No evidence of stenosis. Gradients normal for  prosthesis; moderate to severely dilated left atrium.   . TRANSTHORACIC ECHOCARDIOGRAM  2014; 03/2015   Echo 10/'14: EF 55-60%, Mod Conc LVH, Gr 1 DD, Well Seated AoV Mech Prosthesis with normal P gradients (no stenosis), Mod-Severe LA dilation --> follow-up echo November 2016: Well functioning mechanical valve. Paradoxical septal motion. EF 50-55%. Severe LA dilation. Moderate RA dilation.   . VENTRAL HERNIA REPAIR     repair with mesh by Dr Collene Mares at [place]  Medication Sig  . amiodarone (PACERONE) 200 MG tablet Take 1 tablet (200 mg total) by mouth daily.  Marland Kitchen amLODipine (NORVASC) 10 MG tablet TAKE 1 TABLET EVERY DAY  . ampicillin (PRINCIPEN) 500 MG capsule Take 500 mg by mouth daily.   Marland Kitchen aspirin 81 MG tablet Take  81 mg by mouth daily.  . DENTA 5000 PLUS 1.1 % CREA dental cream Place 1 Squirt onto teeth daily.  . ferrous sulfate 325 (65 FE) MG tablet Take 1 tablet by mouth 2 (two) times daily.  . furosemide (LASIX) 20 MG tablet Take 20 mg daily by mouth.  . isosorbide mononitrate (IMDUR) 30 MG 24 hr tablet Take 30 mg by mouth as directed. 2 tablets by mouth daily  . metoprolol succinate (TOPROL-XL) 50 MG 24 hr tablet TAKE 1 TABLET DAILY WITH OR IMMEDIATELY FOLLOWING A MEAL.  . nitroGLYCERIN (NITROSTAT) 0.4 MG SL tablet 1 TAB(S) EVERY 5 MINUTES X 3 AS NEEDED FOR CHEST PAIN AND CALL 911 SUBLINGUALLY  . potassium chloride SA (K-DUR,KLOR-CON) 20 MEQ tablet Take 1 tablet (20 mEq total) by mouth 2 (two) times daily.  . simvastatin (ZOCOR) 20 MG tablet TAKE 1 TABLET AT BEDTIME  . tamsulosin (FLOMAX) 0.4 MG CAPS capsule Take 0.4 mg by mouth 2 (two) times daily.   . valsartan-hydrochlorothiazide (DIOVAN-HCT) 320-12.5 MG tablet TAKE 1 TABLET EVERY DAY  . warfarin (COUMADIN) 5 MG tablet Take 7.5-10 mg by mouth See admin instructions. 10 mg on Sundays and 7.5 mg all other days  . [DISCONTINUED] IRON PO Take 325 mg by mouth 2 (two) times daily.     Allergies  Allergen Reactions  . Doxycycline Rash    Social History   Tobacco Use  . Smoking status: Former Smoker    Last attempt to quit: 06/16/1997    Years since quitting: 19.9  . Smokeless tobacco: Never Used  Substance Use Topics  . Alcohol use: Yes  . Drug use: No   Social History   Social History Narrative   Married gentleman with no children. Quit smoking in 1998.   Education: 2 years of college. He does drink caffeine.  He drinks social beer.   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 Claiborne Billings in [place]; GI: Dr. Janus Molder in [place]    family history includes COPD in his sister; Colon  cancer in his mother; Congestive Heart Failure in his mother; Lung cancer in his father.  Wt Readings from Last 3 Encounters:  05/15/17 210 lb 9.6 oz (95.5 kg)  04/26/17 209 lb 6.4 oz (95 kg)  04/21/17 215 lb (97.5 kg)    PHYSICAL EXAM BP 114/60   Pulse 60   Ht 5\' 11"  (1.803 m)  Wt 210 lb 9.6 oz (95.5 kg)   BMI 29.37 kg/m  Physical Exam  Constitutional: He is oriented to person, place, and time. He appears well-developed and well-nourished. No distress.  Well-groomed.  HENT:  Head: Normocephalic and atraumatic.  Neck: Normal range of motion. Neck supple. No hepatojugular reflux and no JVD present. Carotid bruit is not present.  Cardiovascular: Normal rate, regular rhythm and normal pulses.  Occasional extrasystoles are present. PMI is not displaced. Exam reveals gallop and S4. Exam reveals no friction rub.  Murmur heard.  Medium-pitched harsh crescendo-decrescendo midsystolic murmur is present with a grade of 2/6 at the upper right sternal border radiating to the neck. Abdominal: Soft. Bowel sounds are normal. He exhibits no distension. There is no tenderness.  Musculoskeletal: Normal range of motion. He exhibits edema (Trivial bilateral lower extremity edema with mild spider veins and varicosities.).  Neurological: He is alert and oriented to person, place, and time.  Skin: Skin is warm and dry.  Psychiatric: He has a normal mood and affect. His behavior is normal. Judgment and thought content normal.  Nursing note and vitals reviewed.   Adult ECG Report  Rate: 60 ;  Rhythm: normal sinus rhythm and Otherwise normal axis, intervals and durations;   Narrative Interpretation: Stable EKG.  Now sinus rhythm.   Other studies Reviewed: Additional studies/ records that were reviewed today include:  Recent Labs:   No results found for: CHOL, HDL, LDLCALC, LDLDIRECT, TRIG, CHOLHDL Lab Results  Component Value Date   CREATININE 0.95 04/21/2017   BUN 10 04/21/2017   NA 134 (L)  04/21/2017   K 4.4 04/21/2017   CL 101 04/21/2017   CO2 27 04/21/2017    ASSESSMENT / PLAN: Problem List Items Addressed This Visit    Abnormal nuclear stress test   Acute diastolic heart failure (HCC)    Seems to have resolved.  Now he is on low-dose Lasix.  Minimal edema.      Relevant Medications   nitroGLYCERIN (NITROSTAT) 0.4 MG SL tablet   CAD, multiple vessel - Primary (Chronic)    Diffuse disease.  Not a lot of true PCI options from heart catheterization.  Plan will be to treat medically at this point. Continue aspirin and statin along with metoprolol which I am switching to evening.  We will then switch to 60 mg total of Imdur all to the morning.  He will also take his valsartan-HCTZ in the morning with amlodipine at night.  This will allow full vasodilatory coverage for antianginal effect throughout the course of the day.      Relevant Medications   nitroGLYCERIN (NITROSTAT) 0.4 MG SL tablet   Other Relevant Orders   EKG 12-Lead (Completed)   Chest pain, atypical (Chronic)    He is now noted several months of some atypical sounding chest discomfort that is not necessarily exertional in nature.  It seems more musculoskeletal. However, Since he did note some improvement with Imdur, I will increase his Imdur to 60 mg daily and take it all in the morning.  I will then switch amlodipine and metoprolol to the evening.      Dyslipidemia, goal LDL below 70 (Chronic)    On simvastatin.  Labs followed by PCP.  Not available.      Relevant Medications   nitroGLYCERIN (NITROSTAT) 0.4 MG SL tablet   Hx of aortic valve replacement, mechanical (Chronic)    Due for f/u Echo this year.  NO change in exam findings.  NO AI.  On Warfarin.      Relevant Orders   EKG 12-Lead (Completed)   Hypertension, essential (Chronic)   Relevant Medications   nitroGLYCERIN (NITROSTAT) 0.4 MG SL tablet   Other Relevant Orders   VAS [place] UPPER EXTREMITY ARTERIAL DUPLEX   Long term current use of  anticoagulant therapy (Chronic)   Persistent atrial fibrillation (HCC); CHA2DS2-VASc Score 4 (Chronic)    Followed by A. fib clinic.  Remains on beta-blocker and amiodarone for rate and rhythm control.  On warfarin for anticoagulation. No sign of recurrence.  This patients CHA2DS2-VASc Score and unadjusted Ischemic Stroke Rate (% per year) is equal to 4.8 % stroke rate/year from a score of 4  Above score calculated as 1 point each if present [CHF, HTN, DM, Vascular=MI/PAD/Aortic Plaque, Age if 65-74, or Male] Above score calculated as 2 points each if present [Age > 75, or Stroke/TIA/TE]       Relevant Medications   nitroGLYCERIN (NITROSTAT) 0.4 MG SL tablet   Other Relevant Orders   EKG 12-Lead (Completed)   Right arm numbness    R arm numbness with differential BP readings @ home --> check Bilateral UE Arterial dopplers.      Relevant Orders   VAS [place] UPPER EXTREMITY ARTERIAL DUPLEX      Current medicines are reviewed at length with the patient today. (+/- concerns) n/a The following changes have been made: see below.  Patient Instructions  MEDICATIONS INSTRUCTIONS  TAKE ISOSORBIDE  MN 60 MG ( ALL) TO MORNING TAKE AMIODARONE IN THE MORNING   TAKE AMLODIPINE IN THE EVENING TAKE METOPROLOL IN THE EVENING    SCHEDULE AT [address] Your physician has requested that you have a upper extremity arterial duplex. This test is an ultrasound of the arteries in the arms. It looks at arterial blood flow in the  arms. Allow one hour for  Upper Arterial scans. There are no restrictions or special instructions     Your physician recommends that you schedule a follow-up appointment in 3-4 MONTHS WITH DR Blimy Napoleon.   If you need a refill on your cardiac medications before your next appointment, please call your pharmacy.    Studies Ordered:   Orders Placed This Encounter  Procedures  . EKG 12-Lead      Glenetta Hew, M.D., M.S. Interventional  Cardiologist   Pager # [phone] Phone # [phone] [address]. [place], [place] [address]   Thank you for choosing [facility]!!

## 2017-05-22 ENCOUNTER — Encounter: Payer: Self-pay | Admitting: Cardiology

## 2017-05-22 DIAGNOSIS — I319 Disease of pericardium, unspecified: Secondary | ICD-10-CM | POA: Insufficient documentation

## 2017-05-22 DIAGNOSIS — R0789 Other chest pain: Secondary | ICD-10-CM | POA: Insufficient documentation

## 2017-05-22 NOTE — Assessment & Plan Note (Signed)
Due for f/u Echo this year.  NO change in exam findings.  NO AI.  On Warfarin.

## 2017-05-22 NOTE — Assessment & Plan Note (Signed)
He is now noted several months of some atypical sounding chest discomfort that is not necessarily exertional in nature.  It seems more musculoskeletal. However, Since he did note some improvement with Imdur, I will increase his Imdur to 60 mg daily and take it all in the morning.  I will then switch amlodipine and metoprolol to the evening.

## 2017-05-22 NOTE — Assessment & Plan Note (Signed)
R arm numbness with differential BP readings @ home --> check Bilateral UE Arterial dopplers.

## 2017-05-22 NOTE — Assessment & Plan Note (Signed)
On simvastatin.  Labs followed by PCP.  Not available.

## 2017-05-22 NOTE — Assessment & Plan Note (Signed)
Diffuse disease.  Not a lot of true PCI options from heart catheterization.  Plan will be to treat medically at this point. Continue aspirin and statin along with metoprolol which I am switching to evening.  We will then switch to 60 mg total of Imdur all to the morning.  He will also take his valsartan-HCTZ in the morning with amlodipine at night.  This will allow full vasodilatory coverage for antianginal effect throughout the course of the day.

## 2017-05-22 NOTE — Assessment & Plan Note (Signed)
Followed by A. fib clinic.  Remains on beta-blocker and amiodarone for rate and rhythm control.  On warfarin for anticoagulation. No sign of recurrence.  This patients CHA2DS2-VASc Score and unadjusted Ischemic Stroke Rate (% per year) is equal to 4.8 % stroke rate/year from a score of 4  Above score calculated as 1 point each if present [CHF, HTN, DM, Vascular=MI/PAD/Aortic Plaque, Age if 65-74, or Male] Above score calculated as 2 points each if present [Age > 75, or Stroke/TIA/TE]

## 2017-05-22 NOTE — Assessment & Plan Note (Signed)
Seems to have resolved.  Now he is on low-dose Lasix.  Minimal edema.

## 2017-05-23 ENCOUNTER — Ambulatory Visit (HOSPITAL_COMMUNITY)
Admission: RE | Admit: 2017-05-23 | Discharge: 2017-05-23 | Disposition: A | Discharge status: Home / Self Care | Payer: Medicare HMO | Source: Ambulatory Visit | Attending: Cardiovascular Disease | Admitting: Cardiovascular Disease

## 2017-05-23 ENCOUNTER — Encounter: Payer: Self-pay | Admitting: Cardiology

## 2017-05-23 DIAGNOSIS — R200 Anesthesia of skin: Secondary | ICD-10-CM

## 2017-05-23 DIAGNOSIS — R202 Paresthesia of skin: Secondary | ICD-10-CM | POA: Insufficient documentation

## 2017-05-23 DIAGNOSIS — I10 Essential (primary) hypertension: Secondary | ICD-10-CM

## 2017-05-23 DIAGNOSIS — I739 Peripheral vascular disease, unspecified: Secondary | ICD-10-CM | POA: Diagnosis not present

## 2017-05-23 MED ORDER — FUROSEMIDE 20 MG PO TABS: 20.0000 mg | ORAL_TABLET | Freq: Every day | ORAL | 1 refills | Status: DC

## 2017-05-23 MED ORDER — ISOSORBIDE MONONITRATE ER 30 MG PO TB24: 60.0000 mg | ORAL_TABLET | ORAL | 1 refills | Status: DC

## 2017-05-23 MED ORDER — AMIODARONE HCL 200 MG PO TABS: 200.0000 mg | ORAL_TABLET | Freq: Every day | ORAL | 1 refills | Status: DC

## 2017-05-24 ENCOUNTER — Other Ambulatory Visit: Payer: Self-pay | Admitting: *Deleted

## 2017-06-13 ENCOUNTER — Other Ambulatory Visit: Payer: Self-pay | Admitting: Cardiology

## 2017-06-14 ENCOUNTER — Ambulatory Visit (INDEPENDENT_AMBULATORY_CARE_PROVIDER_SITE_OTHER): Payer: Medicare HMO | Admitting: Pharmacist

## 2017-06-14 DIAGNOSIS — Z7901 Long term (current) use of anticoagulants: Secondary | ICD-10-CM | POA: Diagnosis not present

## 2017-06-14 DIAGNOSIS — Z952 Presence of prosthetic heart valve: Secondary | ICD-10-CM

## 2017-06-14 LAB — PROTIME-INR
INR: 4.2 — ABNORMAL HIGH (ref 0.8–1.2)
PROTHROMBIN TIME: 40.1 s — AB (ref 9.1–12.0)

## 2017-06-27 ENCOUNTER — Other Ambulatory Visit: Payer: Self-pay | Admitting: Cardiology

## 2017-06-28 LAB — PROTIME-INR
INR: 1.9 — ABNORMAL HIGH (ref 0.8–1.2)
PROTHROMBIN TIME: 18.6 s — AB (ref 9.1–12.0)

## 2017-06-29 ENCOUNTER — Ambulatory Visit (INDEPENDENT_AMBULATORY_CARE_PROVIDER_SITE_OTHER): Payer: Medicare HMO | Admitting: Pharmacist

## 2017-06-29 DIAGNOSIS — Z7901 Long term (current) use of anticoagulants: Secondary | ICD-10-CM

## 2017-06-29 DIAGNOSIS — Z952 Presence of prosthetic heart valve: Secondary | ICD-10-CM

## 2017-07-10 ENCOUNTER — Other Ambulatory Visit: Payer: Self-pay | Admitting: Cardiology

## 2017-07-11 LAB — PROTIME-INR
INR: 2.9 — ABNORMAL HIGH (ref 0.8–1.2)
Prothrombin Time: 28.3 s — ABNORMAL HIGH (ref 9.1–12.0)

## 2017-07-13 ENCOUNTER — Ambulatory Visit (INDEPENDENT_AMBULATORY_CARE_PROVIDER_SITE_OTHER): Payer: Medicare HMO | Admitting: Pharmacist

## 2017-07-13 DIAGNOSIS — Z952 Presence of prosthetic heart valve: Secondary | ICD-10-CM

## 2017-07-13 DIAGNOSIS — Z7901 Long term (current) use of anticoagulants: Secondary | ICD-10-CM

## 2017-08-07 ENCOUNTER — Encounter: Payer: Self-pay | Admitting: Cardiology

## 2017-08-08 ENCOUNTER — Encounter: Payer: Self-pay | Admitting: Cardiology

## 2017-08-09 ENCOUNTER — Other Ambulatory Visit: Payer: Self-pay | Admitting: Cardiology

## 2017-08-09 LAB — PROTIME-INR: INR: 3.7 — AB (ref <=1.1)

## 2017-08-10 ENCOUNTER — Ambulatory Visit (INDEPENDENT_AMBULATORY_CARE_PROVIDER_SITE_OTHER): Payer: Medicare HMO | Admitting: Pharmacist Clinician (PhC)/ Clinical Pharmacy Specialist

## 2017-08-10 DIAGNOSIS — I481 Persistent atrial fibrillation: Secondary | ICD-10-CM

## 2017-08-10 DIAGNOSIS — Z952 Presence of prosthetic heart valve: Secondary | ICD-10-CM

## 2017-08-10 DIAGNOSIS — I4819 Other persistent atrial fibrillation: Secondary | ICD-10-CM

## 2017-08-10 DIAGNOSIS — Z7901 Long term (current) use of anticoagulants: Secondary | ICD-10-CM

## 2017-08-10 LAB — PROTIME-INR
INR: 3.7 — AB (ref 0.8–1.2)
Prothrombin Time: 36.1 s — ABNORMAL HIGH (ref 9.1–12.0)

## 2017-08-11 ENCOUNTER — Other Ambulatory Visit (HOSPITAL_COMMUNITY): Payer: Self-pay | Admitting: *Deleted

## 2017-08-11 ENCOUNTER — Telehealth: Payer: Self-pay | Admitting: Cardiology

## 2017-08-11 MED ORDER — POTASSIUM CHLORIDE CRYS ER 20 MEQ PO TBCR: 20.0000 meq | EXTENDED_RELEASE_TABLET | Freq: Two times a day (BID) | ORAL | 2 refills | Status: DC

## 2017-08-11 NOTE — Telephone Encounter (Signed)
Returned call to patient who is calling to f/up on his MyChart message concerning his hand. Message copied below.  August 08, 2017. He has he has not had further testing done of his hand/arm to evalute. He reports his BP in both arms today was the same 140/65.   Erik Meyer  to Erik Man, MD       7:18 PM  Second request...   I spoke to you on my last visit about my right hand being cold all the time and my pressure check was different from my left hand. Your pad test show no problem.  Well now my hand is still cold and is BLUE... I checked the blood ox and it is 97/98. blood pressure around 140/70-62 in both arms.  Please advise.  Erik Meyer

## 2017-08-11 NOTE — Telephone Encounter (Signed)
New message    Patient requesting a response to MyChart message. Please call

## 2017-08-13 NOTE — Telephone Encounter (Signed)
This is definitely a concerning finding.  I will forward this note to our Vascular Cardiologists to see if they have any suggestions as to the next diagnostic test.  Glenetta Hew, MD

## 2017-08-18 NOTE — Telephone Encounter (Signed)
SPOKE TO PATIENT. APPOINTMENT SCHEDULE TO DISCUSS ANY OTHER TESTING NEED FOR ISSUES

## 2017-08-22 ENCOUNTER — Ambulatory Visit: Payer: Medicare HMO | Admitting: Cardiovascular Disease

## 2017-08-22 ENCOUNTER — Encounter: Payer: Self-pay | Admitting: Cardiovascular Disease

## 2017-08-22 VITALS — BP 118/60 | HR 64 | Ht 71.0 in | Wt 210.0 lb

## 2017-08-22 DIAGNOSIS — Z952 Presence of prosthetic heart valve: Secondary | ICD-10-CM

## 2017-08-22 DIAGNOSIS — I2510 Atherosclerotic heart disease of native coronary artery without angina pectoris: Secondary | ICD-10-CM

## 2017-08-22 DIAGNOSIS — R200 Anesthesia of skin: Secondary | ICD-10-CM | POA: Diagnosis not present

## 2017-08-22 NOTE — Progress Notes (Signed)
Cardiology Office Note   Date:  08/22/2017   ID:  Erik Meyer, DOB 1940-11-28, MRN [id number]  PCP:  Ivan Anchors, MD  Cardiologist:   Dr. Ellyn Hack.   Chief Complaint  Patient presents with  . Follow-up    pt states a few days ago left hand and arm was blue; now c/o coldness, numbness/tingling, and achng in right arm      History of Present Illness: Erik Meyer is a 77 y.o. male who was referred by Dr. Ellyn Hack for evaluation of possible vascular disease affecting the right arm. 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  He is on long-term anticoagulation with warfarin. He reports prolonged symptoms of intermittent right hand tingling and occasional bluish discoloration especially when he is exposed to the cold weather.  He underwent upper extremity Doppler studies in January for this reason which showed equal pressure in both arms and normal waveforms with no evidence of obstructive disease in the macro vascular structure.  Recently, he developed bluish discoloration of the right hand in the setting of exposure to cold weather.  There was minimal discomfort and this resolved on its own without intervention.  He is left-handed and denies any arm claudication.   Past Medical History:  Diagnosis Date  . Anemia   . CAD, multiple vessel 2004   Found during preop cath for [facility] AVR procedure; Myoview 5/'13: No ischemia or infarct, EF 53% --> cath December 2018: Proximal LAD 65% followed by mid LAD 65% then 100% occlusion.  Patent LIMA-LAD.  SVG-D2 occluded.  RPDA occluded. Seq SVG-r PDA-PL patent to PDA, but the PDA is occluded as is the proximal native PDA  . COPD (chronic obstructive pulmonary disease) ([place])   . Diverticulosis   . Dyslipidemia, goal LDL below 70    Monitored by PCP. On statin (last labs scan from September 2015: TC 149, TG 127,  HDL 55, LDL 69)  . ED (erectile dysfunction)   . GERD (gastroesophageal reflux disease)   . History of SBO (small bowel obstruction) March 2015   Abdominal surgery with LOA  . Hx of irritable bowel syndrome   . Hypertension, essential   . Lesion of right native kidney  08/20/2013   Abdominal US : 2.4 cm x 1.7 complex lesion in the upper pole the right kidney in March of 2012 concerning for RCC although enlarging hemorrhagic cyst could have this appearance as well.,   . Monoclonal gammopathy of undetermined significance   . Psoriasis   . S/P AVR (aortic valve replacement) and aortoplasty 2004   Dr. Cyndia BentDeneen Harts procedure - St. Jude aVR (25 mm prosthesis) with aortic root conduit;; Echo 10/'14: EF 55-60%, Mod Conc LVH, Gr 1 DD, Well Seated AoV Mech Prosthesis with normal P gradients (no stenosis), Mod-Severe LA dilation --> follow-up echo November 2016: Well functioning mechanical valve. Paradoxical septal motion. EF 50-55%. Severe LA dilation. Moderate RA dilation.   . S/P CABG x 4 2004   LIMA-LAD, SVG-D1, SVG- PDA-PLA (along with AVR)    Past Surgical History:  Procedure Laterality Date  . ABDOMINAL AORTIC ANEURYSM REPAIR    . AORTIC VALVE REPLACEMENT    . ASD REPAIR  12/26/2002   Aortic valve replacement and replacement of aortic root aneurysm using a 25 mm St. JUDE mechanical valve cinduit with reimplantation of the coronary arteeries  . CARDIAC CATHETERIZATION  Aug  13,2004   diffuse  coronary disease,prior to his BENTALL PROCEDURE  . CORONARY ARTERY BYPASS GRAFT  12/26/2002   LIMA to LAD,SVG to diagonal, SVG to PDA and PLA.   Marland Kitchen CORONARY/GRAFT ANGIOGRAPHY N/A 04/21/2017   Procedure: CORONARY/GRAFT ANGIOGRAPHY;  Surgeon: Leonie Man, MD;  Location: [facility] INVASIVE CV LAB: p & mLAD 65% then m100%. Patent LIMA-LAD. 100% SVG-D2 (D1 & D2 fill via native flow. RCA-RPAV patent, 100% ostrPDA, but SVG-rPDA-RPL patent to PDA then occluded.  Marland Kitchen LAPAROSCOPIC LYSIS OF ADHESIONS  March 2015    performed in [place] for SBO   . NM MYOVIEW LTD  10/04/2011   showed no ischemia;EF52%  . PARTIAL COLECTOMY  1994  . TRANSTHORACIC ECHOCARDIOGRAM  October 2013   CW doppler gradient of 2.49m/ second across the aortic valve with a mean  gradient of 12 ,which was normal for valve, normal RSVP ANDSYSTOLIC FUNCTION WIYH DIASTOLIC RELAXATION ABNORMALITY  . TRANSTHORACIC ECHOCARDIOGRAM  October 2014   EF 55-60%. Mild concentric LVH;  Grade 1 Diastolic Dysfunction.; St. Jude familiar mechanical prosthesis well seated. No evidence of stenosis. Gradients normal for  prosthesis; moderate to severely dilated left atrium.   . TRANSTHORACIC ECHOCARDIOGRAM  2014; 03/2015   Echo 10/'14: EF 55-60%, Mod Conc LVH, Gr 1 DD, Well Seated AoV Mech Prosthesis with normal P gradients (no stenosis), Mod-Severe LA dilation --> follow-up echo November 2016: Well functioning mechanical valve. Paradoxical septal motion. EF 50-55%. Severe LA dilation. Moderate RA dilation.   . VENTRAL HERNIA REPAIR     repair with mesh by Dr Collene Mares at [facility]     Current Outpatient Medications  Medication Sig Dispense Refill  . amiodarone (PACERONE) 200 MG tablet Take 1 tablet (200 mg total) by mouth daily. 90 tablet 1  . amLODipine (NORVASC) 10 MG tablet TAKE 1 TABLET EVERY DAY 90 tablet 3  . ampicillin (PRINCIPEN) 500 MG capsule Take 500 mg by mouth daily.     Marland Kitchen aspirin 81 MG tablet Take 81 mg by mouth daily.    . DENTA 5000 PLUS 1.1 % CREA dental cream Place 1 Squirt onto teeth daily.  6  . ferrous sulfate 325 (65 FE) MG tablet Take 1 tablet by mouth 2 (two) times daily.    . furosemide (LASIX) 20 MG tablet Take 1 tablet (20 mg total) by mouth daily. 90 tablet 1  . isosorbide mononitrate (IMDUR) 30 MG 24 hr tablet Take 2 tablets (60 mg total) by mouth as directed. 180 tablet 1  . metoprolol succinate (TOPROL-XL) 50 MG 24 hr tablet TAKE 1 TABLET DAILY WITH OR IMMEDIATELY FOLLOWING A MEAL. 90 tablet 3  . nitroGLYCERIN (NITROSTAT) 0.4  MG SL tablet 1 TAB(S) EVERY 5 MINUTES X 3 AS NEEDED FOR CHEST PAIN AND CALL 911 SUBLINGUALLY  11  . potassium chloride SA (K-DUR,KLOR-CON) 20 MEQ tablet Take 1 tablet (20 mEq total) by mouth 2 (two) times daily. 180 tablet 2  . simvastatin (ZOCOR) 20 MG tablet TAKE 1 TABLET AT BEDTIME 90 tablet 3  . tamsulosin (FLOMAX) 0.4 MG CAPS capsule Take 0.4 mg by mouth 2 (two) times daily.     . valsartan-hydrochlorothiazide (DIOVAN-HCT) 320-12.5 MG tablet TAKE 1 TABLET EVERY DAY 90 tablet 3  . warfarin (COUMADIN) 5 MG tablet Take 7.5-10 mg by mouth See admin instructions. 10 mg on Sundays and 7.5 mg all other days     No current facility-administered medications for this visit.     Allergies:   Doxycycline    Social History:  The patient  reports that he quit smoking about 20 years ago. He has never used smokeless tobacco. He reports that he drinks alcohol. He reports that he does not use drugs.   Family History:  The patient's family history includes COPD in his sister; Colon cancer in his mother; Congestive Heart Failure in his mother; Lung cancer in his father.    ROS:  Please see the history of present illness.   Otherwise, review of systems are positive for none.   All other systems are reviewed and negative.    PHYSICAL EXAM: VS:  BP 118/60 Comment: right  Pulse 64   Ht 5\' 11"  (1.803 m)   Wt 210 lb (95.3 kg)   BMI 29.29 kg/m  , BMI Body mass index is 29.29 kg/m. GEN: Well nourished, well developed, in no acute distress  HEENT: normal  Neck: no JVD, carotid bruits, or masses Cardiac: RRR; no murmurs, rubs, or gallops,no edema .  Normal mechanical heart sound.   Respiratory:  clear to auscultation bilaterally, normal work of breathing GI: soft, nontender, nondistended, + BS MS: no deformity or atrophy  Skin: warm and dry, no rash Neuro:  Strength and sensation are intact Psych: euthymic mood, full affect Vascular: Brachial pulses normal bilaterally.  Radial and ulnar pulses normal  on both sides.   EKG:  EKG is not ordered today.   Recent Labs: 04/21/2017: BUN 10; Creatinine, Ser 0.95; Hemoglobin 12.4; Platelets 110; Potassium 4.4; Sodium 134    Lipid Panel No results found for: CHOL, TRIG, HDL, CHOLHDL, VLDL, LDLCALC, LDLDIRECT    Wt Readings from Last 3 Encounters:  08/22/17 210 lb (95.3 kg)  05/15/17 210 lb 9.6 oz (95.5 kg)  04/26/17 209 lb 6.4 oz (95 kg)      No flowsheet data found.    ASSESSMENT AND PLAN:  1.  Right arm bluish discoloration: Cold sensitivity could suggest some form of Raynaud's phenomena or microvascular disease.  His pulses are completely normal and Doppler showed normal pressure and waveforms. Thoracic outlet syndrome is a possibility but less likely.  Given improvement in symptoms, continue observation.  I instructed him to avoid the cold weather.  If he has recurrent symptoms, CTA of the aorta and right upper extremity can be considered.  2.  Coronary artery disease involving native coronary arteries without angina: Continue medical therapy.  3.  Status post aortic valve replacement with a mechanical valve on long-term anticoagulation with warfarin.    Disposition:   FU with me as needed  Signed,  Kathlyn Sacramento, MD  08/22/2017 11:31 AM    [place]

## 2017-08-22 NOTE — Patient Instructions (Signed)
Medication Instructions: Your physician recommends that you continue on your current medications as directed. Please refer to the Current Medication list given to you today.  If you need a refill on your cardiac medications before your next appointment, please call your pharmacy.    Follow-Up: Your physician wants you to follow-up as needed with Dr. Arida.   Thank you for choosing [facility]!!      

## 2017-08-29 ENCOUNTER — Ambulatory Visit: Payer: Medicare HMO | Admitting: Cardiology

## 2017-08-29 ENCOUNTER — Encounter: Payer: Self-pay | Admitting: Cardiology

## 2017-08-29 VITALS — BP 152/63 | HR 60 | Ht 71.0 in | Wt 211.0 lb

## 2017-08-29 DIAGNOSIS — R5383 Other fatigue: Secondary | ICD-10-CM

## 2017-08-29 DIAGNOSIS — I481 Persistent atrial fibrillation: Secondary | ICD-10-CM

## 2017-08-29 DIAGNOSIS — Z952 Presence of prosthetic heart valve: Secondary | ICD-10-CM

## 2017-08-29 DIAGNOSIS — I5032 Chronic diastolic (congestive) heart failure: Secondary | ICD-10-CM

## 2017-08-29 DIAGNOSIS — I4589 Other specified conduction disorders: Secondary | ICD-10-CM

## 2017-08-29 DIAGNOSIS — I4819 Other persistent atrial fibrillation: Secondary | ICD-10-CM

## 2017-08-29 DIAGNOSIS — I10 Essential (primary) hypertension: Secondary | ICD-10-CM

## 2017-08-29 DIAGNOSIS — I2510 Atherosclerotic heart disease of native coronary artery without angina pectoris: Secondary | ICD-10-CM

## 2017-08-29 MED ORDER — METOPROLOL SUCCINATE ER 25 MG PO TB24: 25.0000 mg | ORAL_TABLET | Freq: Every day | ORAL | 3 refills | Status: DC

## 2017-08-29 MED ORDER — AMIODARONE HCL 200 MG PO TABS: 100.0000 mg | ORAL_TABLET | Freq: Every day | ORAL | 1 refills | Status: DC

## 2017-08-29 MED ORDER — FUROSEMIDE 20 MG PO TABS: ORAL_TABLET | ORAL | 3 refills | Status: DC

## 2017-08-29 NOTE — Patient Instructions (Signed)
MEDICATION INSTRUCTIONS  --- DECREASE AMIODARONE TO 100 MG ( 1/2 TABLET OF 200 MG) DAILY   ---- DECREASE METOPROLOL SUCCINATE ( TOPROL XL ) 25 MG  ( 1/2 5TABLET OF 50 MG )  ONCE DAILY.   ---  MAY TAKE 1 TO 2 TABLETS OF LASIX ( FUROSEMIDE) A DAY AS NEEDED FOR SWELLING OR SHORTNESS OF BREATH.    SCHEDULE IN 2 WEEKS AT [place] [address] Your physician has requested that you have an exercise tolerance test. For further information please visit [email or web address]. Please also follow instruction sheet, as given.    Your physician recommends that you schedule a follow-up appointment in [place].    If you need a refill on your cardiac medications before your next appointment, please call your pharmacy.

## 2017-08-29 NOTE — Progress Notes (Signed)
PCP: Ivan Anchors, MD  Clinic Note: Chief Complaint  Patient presents with  . Fatigue    HPI: Erik Meyer is a 77 y.o. male with a PMH below who presents today for what amounts to be post cath follow-up with history of CAD and A. Fib complicating cardiomyopathy..  He has recently been diagnosed with A. fib that complicates his cardiomyopathy.  He underwent cardiac catheterization for an abnormal stress test in December.  NATHANYL ANDUJO was last seen on May 15, 2017 noticing off and on unusual/atypical type chest pain.  Not associated with exertion.  Was not sensing being in or out of A. fib.  Was able to walk around [place] without difficulty.  Did note some less energy and a little bit of exertional dyspnea, but was otherwise stable.   He actually was just recently seen by Dr. Fletcher Anon with complaints of right hand/arm numbness.  Intermittently noted there is left hand and arm were blue and then his right arm was aching and numb and blue.  He was evaluated with -> upper extremity arterial Dopplers showing normal waveforms in both upper extremities.  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   Recent Hospitalizations: None  Studies Personally Reviewed - (if available, images/films reviewed: From Epic Chart or Care Everywhere)  none  Interval History: Erik Meyer presents today really noting a lot of fatigue and just overall feeling tired mostly fatigue in his legs.  He is seen and examined hard time feeling as though his body can keep up with him.  His stamina is just notably down compared to what it had been.  He is not necessarily noticing exertional dyspnea just fatigue.  No exertional chest tightness or pressure.  He really indicates that he cannot tell if he is or is not in A. fib.  He has no PND orthopnea but does have some lower extremity edema. He still notes his right hand seeming somewhat blue and swollen with some tingling.  As for  his edema he is probably taken 1-2 Lasix doses a day and says the swelling is relatively controlled. He denies any true syncope or near syncope type symptoms.  No TIA/amaurosis fugax symptoms. No melena, hematochezia, hematuria, or epstaxis. No claudication.  ROS: A comprehensive was performed. Review of Systems  Constitutional: Positive for malaise/fatigue. Negative for chills, diaphoresis and fever.  Gastrointestinal: Negative for abdominal pain, diarrhea and heartburn.  Genitourinary: Negative for hematuria.  Musculoskeletal: Positive for joint pain and myalgias (Legs ache). Negative for falls.  Neurological: Negative for dizziness.  Psychiatric/Behavioral: Negative for depression and memory loss. The patient is not nervous/anxious and does not have insomnia.   All other systems reviewed and are negative.   I have reviewed and (if needed) personally updated the patient's problem list, medications, allergies, past medical and surgical history, social and family history.   Past Medical History:  Diagnosis Date  . Anemia   . CAD, multiple vessel 2004   Found during preop cath for [facility] AVR procedure; Myoview 5/'13: No ischemia or infarct, EF 53% --> cath December 2018: Proximal LAD 65% followed by mid LAD 65% then 100% occlusion.  Patent LIMA-LAD.  SVG-D2 occluded.  RPDA occluded. Seq SVG-r PDA-PL patent to PDA, but the PDA is occluded as is the proximal native PDA  . COPD (chronic obstructive pulmonary disease) ([place])   . Diverticulosis   . Dyslipidemia, goal LDL below 70    Monitored by PCP.  On statin (last labs scan from September 2015: TC 149, TG 127, HDL 55, LDL 69)  . ED (erectile dysfunction)   . GERD (gastroesophageal reflux disease)   . History of SBO (small bowel obstruction) March 2015   Abdominal surgery with LOA  . Hx of irritable bowel syndrome   . Hypertension, essential   . Lesion of right native kidney  08/20/2013   Abdominal US : 2.4 cm x 1.7 complex lesion in  the upper pole the right kidney in March of 2012 concerning for RCC although enlarging hemorrhagic cyst could have this appearance as well.,   . Monoclonal gammopathy of undetermined significance   . Psoriasis   . S/P AVR (aortic valve replacement) and aortoplasty 2004   Dr. Cyndia BentDeneen Harts procedure - St. Jude aVR (25 mm prosthesis) with aortic root conduit;; Echo 10/'14: EF 55-60%, Mod Conc LVH, Gr 1 DD, Well Seated AoV Mech Prosthesis with normal P gradients (no stenosis), Mod-Severe LA dilation --> follow-up echo November 2016: Well functioning mechanical valve. Paradoxical septal motion. EF 50-55%. Severe LA dilation. Moderate RA dilation.   . S/P CABG x 4 2004   LIMA-LAD, SVG-D1, SVG- PDA-PLA (along with AVR)   CORONARY/GRAFT ANGIOGRAPHY - 04/11/2017 - med Rx; The findings correlate directly with the stress test findings of inferior infarct and anterior infarct.  In the absence of ongoing anginal symptoms,  would not attempt to try PCI of the chronically occluded PDA. The minimal segment of LAD compromising a septal perforator has not changed since bypass. Likely not a major issue.   Past Surgical History:  Procedure Laterality Date  . ABDOMINAL AORTIC ANEURYSM REPAIR    . AORTIC VALVE REPLACEMENT    . ASD REPAIR  12/26/2002   Aortic valve replacement and replacement of aortic root aneurysm using a 25 mm St. JUDE mechanical valve cinduit with reimplantation of the coronary arteeries  . CARDIAC CATHETERIZATION  Aug  13,2004   diffuse  coronary disease,prior to his BENTALL PROCEDURE  . CORONARY ARTERY BYPASS GRAFT  12/26/2002   LIMA to LAD,SVG to diagonal, SVG to PDA and PLA.   Marland Kitchen CORONARY/GRAFT ANGIOGRAPHY N/A 04/21/2017   Procedure: CORONARY/GRAFT ANGIOGRAPHY;  Surgeon: Leonie Man, MD;  Location: [facility] INVASIVE CV LAB: p & mLAD 65% then m100%. Patent LIMA-LAD. 100% SVG-D2 (D1 & D2 fill via native flow. RCA-RPAV patent, 100% ostrPDA, but SVG-rPDA-RPL patent to PDA then occluded.  Marland Kitchen  LAPAROSCOPIC LYSIS OF ADHESIONS  March 2015   performed in [facility] for SBO   . NM MYOVIEW LTD  10/04/2011   showed no ischemia;EF52%  . PARTIAL COLECTOMY  1994  . TRANSTHORACIC ECHOCARDIOGRAM  October 2013   CW doppler gradient of 2.50m/ second across the aortic valve with a mean  gradient of 12 ,which was normal for valve, normal RSVP ANDSYSTOLIC FUNCTION WIYH DIASTOLIC RELAXATION ABNORMALITY  . TRANSTHORACIC ECHOCARDIOGRAM  October 2014   EF 55-60%. Mild concentric LVH;  Grade 1 Diastolic Dysfunction.; St. Jude familiar mechanical prosthesis well seated. No evidence of stenosis. Gradients normal for  prosthesis; moderate to severely dilated left atrium.   . TRANSTHORACIC ECHOCARDIOGRAM  2014; 03/2015   Echo 10/'14: EF 55-60%, Mod Conc LVH, Gr 1 DD, Well Seated AoV Mech Prosthesis with normal P gradients (no stenosis), Mod-Severe LA dilation --> follow-up echo November 2016: Well functioning mechanical valve. Paradoxical septal motion. EF 50-55%. Severe LA dilation. Moderate RA dilation.   . VENTRAL HERNIA REPAIR     repair with mesh by Dr Collene Mares at  [facility]    Current Meds  Medication Sig  . amiodarone (PACERONE) 200 MG tablet Take 0.5 tablets (100 mg total) by mouth daily.  Marland Kitchen amLODipine (NORVASC) 10 MG tablet TAKE 1 TABLET EVERY DAY  . ampicillin (PRINCIPEN) 500 MG capsule Take 500 mg by mouth daily.   Marland Kitchen aspirin 81 MG tablet Take 81 mg by mouth daily.  . DENTA 5000 PLUS 1.1 % CREA dental cream Place 1 Squirt onto teeth daily.  . ferrous sulfate 325 (65 FE) MG tablet Take 1 tablet by mouth 2 (two) times daily.  . furosemide (LASIX) 20 MG tablet TAKE 1 TO 2 TABLETS A DAY AS NEEDED  . isosorbide mononitrate (IMDUR) 30 MG 24 hr tablet Take 2 tablets (60 mg total) by mouth as directed.  . nitroGLYCERIN (NITROSTAT) 0.4 MG SL tablet 1 TAB(S) EVERY 5 MINUTES X 3 AS NEEDED FOR CHEST PAIN AND CALL 911 SUBLINGUALLY  . potassium chloride SA (K-DUR,KLOR-CON) 20 MEQ tablet Take 1 tablet (20 mEq  total) by mouth 2 (two) times daily.  . simvastatin (ZOCOR) 20 MG tablet TAKE 1 TABLET AT BEDTIME  . tamsulosin (FLOMAX) 0.4 MG CAPS capsule Take 0.4 mg by mouth 2 (two) times daily.   . valsartan-hydrochlorothiazide (DIOVAN-HCT) 320-12.5 MG tablet TAKE 1 TABLET EVERY DAY  . warfarin (COUMADIN) 5 MG tablet Take 7.5-10 mg by mouth See admin instructions. 10 mg on Sundays and 7.5 mg all other days  . [DISCONTINUED] amiodarone (PACERONE) 200 MG tablet Take 1 tablet (200 mg total) by mouth daily.  . [DISCONTINUED] furosemide (LASIX) 20 MG tablet Take 1 tablet (20 mg total) by mouth daily.  . [DISCONTINUED] metoprolol succinate (TOPROL-XL) 50 MG 24 hr tablet TAKE 1 TABLET DAILY WITH OR IMMEDIATELY FOLLOWING A MEAL.    Allergies  Allergen Reactions  . Doxycycline Rash    Social History   Tobacco Use  . Smoking status: Former Smoker    Last attempt to quit: 06/16/1997    Years since quitting: 20.2  . Smokeless tobacco: Never Used  Substance Use Topics  . Alcohol use: Yes  . Drug use: No   Social History   Social History Narrative   Married gentleman with no children. Quit smoking in 1998.   Education: 2 years of college. He does drink caffeine.  He drinks social beer.   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 Claiborne Billings in [place]; GI: Dr. Janus Molder in [place]    family history includes COPD in his sister; Colon cancer in his mother; Congestive Heart Failure in his mother; Lung cancer in his father.  Wt Readings from Last 3 Encounters:  08/29/17 211 lb (95.7 kg)  08/22/17 210 lb (95.3 kg)  05/15/17 210 lb 9.6 oz (95.5 kg)    PHYSICAL EXAM BP (!) 152/63   Pulse 60   Ht 5\' 11"  (1.803 m)   Wt 211 lb (95.7 kg)   SpO2 97%   BMI 29.43 kg/m  Physical Exam  Constitutional: He is oriented to person, place, and time. He  appears well-developed and well-nourished. No distress.  Well-groomed.  HENT:  Head: Normocephalic and atraumatic.  Neck: Normal range of motion. Neck supple. No hepatojugular reflux and no JVD present. Carotid bruit is not present.  Cardiovascular: Normal rate, regular rhythm and normal pulses.  Occasional extrasystoles are present.  PMI is not displaced. Exam reveals gallop and S4. Exam reveals no friction rub.  Murmur heard.  Medium-pitched harsh crescendo-decrescendo midsystolic murmur is present with a grade of 2/6 at the upper right sternal border radiating to the neck. Abdominal: Soft. Bowel sounds are normal. He exhibits no distension. There is no tenderness.  Musculoskeletal: Normal range of motion. He exhibits edema (Trivial bilateral lower extremity edema with mild spider veins and varicosities.  Right hand does feel a little bit cool and is a bit darker than the rest of his skin, but is not blue or purple.).  Neurological: He is alert and oriented to person, place, and time.  Psychiatric: He has a normal mood and affect. His behavior is normal. Judgment and thought content normal.  Nursing note and vitals reviewed.   Adult ECG Report Not checked  Other studies Reviewed: Additional studies/ records that were reviewed today include:  Recent Labs:   No results found for: CHOL, HDL, LDLCALC, LDLDIRECT, TRIG, CHOLHDL Lab Results  Component Value Date   CREATININE 0.95 04/21/2017   BUN 10 04/21/2017   NA 134 (L) 04/21/2017   K 4.4 04/21/2017   CL 101 04/21/2017   CO2 27 04/21/2017    ASSESSMENT / PLAN: Problem List Items Addressed This Visit    S/P AVR (aortic valve replacement) and aortoplasty (Chronic)    Valve looked pretty well seated back in November however the EF is down.      Persistent atrial fibrillation (HCC); CHA2DS2-VASc Score 4 (Chronic)    He is on combination of beta-blocker and amiodarone for rate/rhythm control.   With concerns for possible chronotropic  incompetence, Plan is to cut both medication doses in half.  Schedule GXT to assess heart rate responsiveness Continue warfarin for anticoagulation      Relevant Medications   amiodarone (PACERONE) 200 MG tablet   metoprolol succinate (TOPROL XL) 25 MG 24 hr tablet   furosemide (LASIX) 20 MG tablet   Other Relevant Orders   EXERCISE TOLERANCE TEST (ETT)   Hypertension, essential (Chronic)    Blood pressure is little bit high today.  I am little leery of taking down Toprol, but really do not have much more room to go up since he is already on max dose amlodipine and valsartan.  He will be seen in follow-up will also see his blood pressure response to exercise with GXT.      Relevant Medications   amiodarone (PACERONE) 200 MG tablet   metoprolol succinate (TOPROL XL) 25 MG 24 hr tablet   furosemide (LASIX) 20 MG tablet   Fatigue due to treatment - Primary    Not 100% sure what the true etiology of his fatigue is, however with him being on amiodarone and metoprolol, I am concerned that he may have some signs of chronotropic incompetence. Plan: Reduce amiodarone to 100 mg daily.  Reduce Toprol to 25 mg daily. Check GXT in order to determine rate responsiveness to exclude chronotropic incompetence      Chronotropic incompetence    Resting heart rate 6 bpm.  Feels like he can get his heart rate up.  Need to exclude chronotropic incompetence, will also reduce rate/rhythm control.  ->  Cut amiodarone to 100 mg and Toprol to 25 mg      Relevant Orders   EXERCISE TOLERANCE TEST (ETT)   Chronic diastolic heart failure (HCC) (Chronic)    He does have intermittent episodes of heart failure, however I do not feel like that was going on  right now.  He is on Toprol and ARB along with standing dose of Lasix.  I would just simply have him take Lasix as directed 1 to 2 tablets daily as needed.  He has been reluctant to do that in the past but we just went over several the potential as needed options.       Relevant Medications   amiodarone (PACERONE) 200 MG tablet   metoprolol succinate (TOPROL XL) 25 MG 24 hr tablet   furosemide (LASIX) 20 MG tablet   Other Relevant Orders   EXERCISE TOLERANCE TEST (ETT)   CAD, multiple vessel (Chronic)    Severe multivessel disease with significant graft disease as well.  Not a lot of good PCI targets.  He does not really seem to be having angina, however we will need to see what happens when he reduces his metoprolol dose.  Continue amlodipine at 10 mg daily and Imdur at 60 mg daily.  If symptoms recur, would consider Ranexa      Relevant Medications   amiodarone (PACERONE) 200 MG tablet   metoprolol succinate (TOPROL XL) 25 MG 24 hr tablet   furosemide (LASIX) 20 MG tablet   Other Relevant Orders   EXERCISE TOLERANCE TEST (ETT)      Current medicines are reviewed at length with the patient today. (+/- concerns) n/a The following changes have been made: see below.  Patient Instructions  MEDICATION INSTRUCTIONS  --- DECREASE AMIODARONE TO 100 MG ( 1/2 TABLET OF 200 MG) DAILY   ---- DECREASE METOPROLOL SUCCINATE ( TOPROL XL ) 25 MG  ( 1/2 5TABLET OF 50 MG )  ONCE DAILY.   ---  MAY TAKE 1 TO 2 TABLETS OF LASIX ( FUROSEMIDE) A DAY AS NEEDED FOR SWELLING OR SHORTNESS OF BREATH.    SCHEDULE IN 2 WEEKS AT [place] [address] Your physician has requested that you have an exercise tolerance test. For further information please visit [email or web address]. Please also follow instruction sheet, as given.    Your physician recommends that you schedule a follow-up appointment in [place].    If you need a refill on your cardiac medications before your next appointment, please call your pharmacy.    Studies Ordered:   Orders Placed This Encounter  Procedures  . EXERCISE TOLERANCE TEST (ETT)      Glenetta Hew, M.D., M.S. Interventional Cardiologist   Pager # [phone] Phone # [phone] [address] [place], [place] [address]   Thank you for choosing [facility] at [facility]!!

## 2017-09-03 ENCOUNTER — Encounter: Payer: Self-pay | Admitting: Cardiology

## 2017-09-03 NOTE — Assessment & Plan Note (Signed)
He does have intermittent episodes of heart failure, however I do not feel like that was going on right now.  He is on Toprol and ARB along with standing dose of Lasix.  I would just simply have him take Lasix as directed 1 to 2 tablets daily as needed.  He has been reluctant to do that in the past but we just went over several the potential as needed options.

## 2017-09-03 NOTE — Assessment & Plan Note (Signed)
Resting heart rate 6 bpm.  Feels like he can get his heart rate up.  Need to exclude chronotropic incompetence, will also reduce rate/rhythm control.  ->  Cut amiodarone to 100 mg and Toprol to 25 mg

## 2017-09-03 NOTE — Assessment & Plan Note (Signed)
Blood pressure is little bit high today.  I am little leery of taking down Toprol, but really do not have much more room to go up since he is already on max dose amlodipine and valsartan.  He will be seen in follow-up will also see his blood pressure response to exercise with GXT.

## 2017-09-03 NOTE — Assessment & Plan Note (Signed)
He is on combination of beta-blocker and amiodarone for rate/rhythm control.   With concerns for possible chronotropic incompetence, Plan is to cut both medication doses in half.  Schedule GXT to assess heart rate responsiveness Continue warfarin for anticoagulation

## 2017-09-03 NOTE — Assessment & Plan Note (Signed)
Not 100% sure what the true etiology of his fatigue is, however with him being on amiodarone and metoprolol, I am concerned that he may have some signs of chronotropic incompetence. Plan: Reduce amiodarone to 100 mg daily.  Reduce Toprol to 25 mg daily. Check GXT in order to determine rate responsiveness to exclude chronotropic incompetence

## 2017-09-03 NOTE — Assessment & Plan Note (Signed)
Valve looked pretty well seated back in November however the EF is down.

## 2017-09-03 NOTE — Assessment & Plan Note (Signed)
Severe multivessel disease with significant graft disease as well.  Not a lot of good PCI targets.  He does not really seem to be having angina, however we will need to see what happens when he reduces his metoprolol dose.  Continue amlodipine at 10 mg daily and Imdur at 60 mg daily.  If symptoms recur, would consider Ranexa

## 2017-09-04 ENCOUNTER — Other Ambulatory Visit: Payer: Self-pay | Admitting: Cardiology

## 2017-09-04 LAB — PROTIME-INR: INR: 4.2 — AB (ref 0.9–1.1)

## 2017-09-05 ENCOUNTER — Ambulatory Visit (INDEPENDENT_AMBULATORY_CARE_PROVIDER_SITE_OTHER): Payer: Medicare HMO | Admitting: Pharmacist Clinician (PhC)/ Clinical Pharmacy Specialist

## 2017-09-05 DIAGNOSIS — I481 Persistent atrial fibrillation: Secondary | ICD-10-CM

## 2017-09-05 DIAGNOSIS — I4819 Other persistent atrial fibrillation: Secondary | ICD-10-CM

## 2017-09-05 DIAGNOSIS — Z952 Presence of prosthetic heart valve: Secondary | ICD-10-CM

## 2017-09-05 DIAGNOSIS — Z7901 Long term (current) use of anticoagulants: Secondary | ICD-10-CM

## 2017-09-05 LAB — PROTIME-INR
INR: 4.2 — AB (ref 0.8–1.2)
PROTHROMBIN TIME: 40.6 s — AB (ref 9.1–12.0)

## 2017-09-07 ENCOUNTER — Telehealth (HOSPITAL_COMMUNITY): Payer: Self-pay

## 2017-09-07 NOTE — Telephone Encounter (Signed)
Encounter complete. 

## 2017-09-12 ENCOUNTER — Ambulatory Visit (HOSPITAL_COMMUNITY)
Admission: RE | Admit: 2017-09-12 | Discharge: 2017-09-12 | Disposition: A | Discharge status: Home / Self Care | Payer: Medicare HMO | Source: Ambulatory Visit | Attending: Internal Medicine | Admitting: Internal Medicine

## 2017-09-12 DIAGNOSIS — I481 Persistent atrial fibrillation: Secondary | ICD-10-CM | POA: Insufficient documentation

## 2017-09-12 DIAGNOSIS — I2510 Atherosclerotic heart disease of native coronary artery without angina pectoris: Secondary | ICD-10-CM

## 2017-09-12 DIAGNOSIS — I4589 Other specified conduction disorders: Secondary | ICD-10-CM

## 2017-09-12 DIAGNOSIS — I5032 Chronic diastolic (congestive) heart failure: Secondary | ICD-10-CM

## 2017-09-12 HISTORY — PX: OTHER SURGICAL HISTORY: SHX169

## 2017-09-12 LAB — EXERCISE TOLERANCE TEST
CHL CUP MPHR: 145 {beats}/min
CSEPED: 2 min
CSEPEDS: 26 s
CSEPEW: 4.6 METS
CSEPHR: 97 %
CSEPPHR: 141 {beats}/min
RPE: 19
Rest HR: 89 {beats}/min

## 2017-09-20 ENCOUNTER — Other Ambulatory Visit: Payer: Self-pay | Admitting: Cardiology

## 2017-09-20 LAB — PROTIME-INR: INR: 3.3 — AB (ref <=1.1)

## 2017-09-21 ENCOUNTER — Ambulatory Visit (INDEPENDENT_AMBULATORY_CARE_PROVIDER_SITE_OTHER): Payer: Medicare HMO | Admitting: Pharmacist Clinician (PhC)/ Clinical Pharmacy Specialist

## 2017-09-21 DIAGNOSIS — Z952 Presence of prosthetic heart valve: Secondary | ICD-10-CM | POA: Diagnosis not present

## 2017-09-21 DIAGNOSIS — Z7901 Long term (current) use of anticoagulants: Secondary | ICD-10-CM

## 2017-09-21 LAB — PROTIME-INR
INR: 3.3 — AB (ref 0.8–1.2)
PROTHROMBIN TIME: 32 s — AB (ref 9.1–12.0)

## 2017-10-09 ENCOUNTER — Other Ambulatory Visit: Payer: Self-pay | Admitting: Cardiology

## 2017-10-10 ENCOUNTER — Ambulatory Visit: Payer: Medicare HMO | Admitting: Cardiology

## 2017-10-10 ENCOUNTER — Encounter: Payer: Self-pay | Admitting: Cardiology

## 2017-10-10 VITALS — BP 118/57 | HR 74 | Ht 71.0 in | Wt 210.8 lb

## 2017-10-10 DIAGNOSIS — I2510 Atherosclerotic heart disease of native coronary artery without angina pectoris: Secondary | ICD-10-CM | POA: Diagnosis not present

## 2017-10-10 DIAGNOSIS — R5383 Other fatigue: Secondary | ICD-10-CM

## 2017-10-10 DIAGNOSIS — I5032 Chronic diastolic (congestive) heart failure: Secondary | ICD-10-CM

## 2017-10-10 DIAGNOSIS — I4819 Other persistent atrial fibrillation: Secondary | ICD-10-CM

## 2017-10-10 DIAGNOSIS — I481 Persistent atrial fibrillation: Secondary | ICD-10-CM | POA: Diagnosis not present

## 2017-10-10 DIAGNOSIS — I10 Essential (primary) hypertension: Secondary | ICD-10-CM | POA: Diagnosis not present

## 2017-10-10 DIAGNOSIS — E785 Hyperlipidemia, unspecified: Secondary | ICD-10-CM

## 2017-10-10 MED ORDER — METOPROLOL SUCCINATE ER 50 MG PO TB24: 50.0000 mg | ORAL_TABLET | Freq: Every day | ORAL | 3 refills | Status: DC

## 2017-10-10 MED ORDER — AMIODARONE HCL 200 MG PO TABS: ORAL_TABLET | ORAL | 6 refills | Status: DC

## 2017-10-10 MED ORDER — ISOSORBIDE MONONITRATE ER 30 MG PO TB24: 30.0000 mg | ORAL_TABLET | Freq: Every morning | ORAL | 2 refills | Status: DC

## 2017-10-10 NOTE — Assessment & Plan Note (Signed)
Seems to be euvolemic, if anything may be a little bit dry.  I think we can go back to using Lasix as PRN med as opposed to standing medication.  His ER visit with constipation was probably related to some mild dehydration.  Less of an issue especially over the summer months) as needed as opposed to standing dose Lasix.

## 2017-10-10 NOTE — Assessment & Plan Note (Signed)
Multivessel disease.  Does not seem to be having a lot of angina.  Can try to wean off Imdur for now.  He still on metoprolol which we will increase the dose and high-dose amlodipine as well.  He wants to see if he can tolerate being off Imdur, so we will attempt to wean off.

## 2017-10-10 NOTE — Assessment & Plan Note (Addendum)
He was successfully cardioverted with a combination of amiodarone blocker.  With continued fatigue, I was concerned about chronotropic incompetence, but he clearly was able to get his heart rate up with reduced beta-blocker and amiodarone dose. Plan: .  To avoid long-term complications of amiodarone, we will stop amiodarone use for PRN breakthrough episodes of A. Fib.  Go back to 50 mg Toprol.  Continue warfarin.

## 2017-10-10 NOTE — Assessment & Plan Note (Signed)
Detailed instructions noted and patient instructions segment:  STOP AMIODARONE now  Only use if you have break through  afib - take 400 mg twice a day  For 7 days- if afib continues contact office.  In 1 week after stopping Amiodarone , increase metoprolol succinate ( Toprol XL ) 50 mg  Daily.

## 2017-10-10 NOTE — Progress Notes (Signed)
PCP: Ivan Anchors, MD  Clinic Note: Chief Complaint  Patient presents with  . Follow-up    6 weeks; Pt states no Sx.   . Fatigue  . Atrial Fibrillation    No recurrence  . Coronary Artery Disease    No angina  . Cardiac Valve Problem    Prosthetic aortic valve    HPI: Erik Meyer is a 77 y.o. male with a PMH below who presents today for what amounts to be post cath follow-up with history of CAD and A. Fib complicating cardiomyopathy..  He has recently been diagnosed with A. fib that complicates his cardiomyopathy.  He underwent cardiac catheterization for an abnormal stress test in December.  Erik Meyer was last seen August 29, 2017: -Noted fatigue --> beta-blocker dose was reduced.  We checked a GXT to exclude chronotropic incompetence.  ETT ordered to assess chronotropic incompetence; fatigue was not helped by being off statin -Still having symptoms cold right hand and blue discoloration  --thought to be related to Raynaud's phenomena  Recent Hospitalizations:   ER visit on May 26: Abdominal pain and cramping thought to be related to constipation.  Blames on being on Lasix.  Studies Personally Reviewed - (if available, images/films reviewed: From Epic Chart or Care Everywhere)  ETT 09/12/17: 5 METS Baseline ST depression.  No new changes with exercise.  Heart rate went to 141 bpm.  Poor exercise tolerance reaching target heart rate at 2:26-minute  Interval History: Erik Meyer presents today really still feeling tired and fatigued.  He indicated that he was only able to go less than 2 minutes on the treadmill.  His stamina seems to just be down despite the fact that his heart rate is able to go up.  He otherwise has not had any inclination of recurrent A. fib (now that he knows what it feels like he is sure that he is has not had any).  He does not have any exertional chest tightness or pressure, but has had intermittent discomfort that are not necessarily associated with  any particular activity.  No PND, orthopnea or edema --in fact he feels a little dehydrated side on occasion. No TIA/amaurosis fugax or syncope/near syncope symptoms.  No melena, hematochezia, hematuria, epistaxis but he does have significant bruising. No claudication  ROS: A comprehensive was performed. Review of Systems  Constitutional: Positive for malaise/fatigue. Negative for chills, diaphoresis and fever.  HENT: Negative for nosebleeds.   Respiratory: Negative for cough and shortness of breath.   Cardiovascular: Negative for claudication.  Gastrointestinal: Negative for abdominal pain, diarrhea and heartburn.  Genitourinary: Negative for hematuria.  Musculoskeletal: Positive for joint pain and myalgias (Legs ache). Negative for falls.  Neurological: Negative for dizziness.  Endo/Heme/Allergies: Bruises/bleeds easily.  Psychiatric/Behavioral: Negative for depression and memory loss. The patient is not nervous/anxious and does not have insomnia.   All other systems reviewed and are negative.   I have reviewed and (if needed) personally updated the patient's problem list, medications, allergies, past medical and surgical history, social and family history.   Past Medical History:  Diagnosis Date  . Anemia   . CAD, multiple vessel 2004   Found during preop cath for [facility] AVR procedure; Myoview 5/'13: No ischemia or infarct, EF 53% --> cath December 2018: Proximal LAD 65% followed by mid LAD 65% then 100% occlusion.  Patent LIMA-LAD.  SVG-D2 occluded.  RPDA occluded. Seq SVG-r PDA-PL patent to PDA, but the PDA is occluded as is the proximal native PDA  .  COPD (chronic obstructive pulmonary disease) ([place])   . Diverticulosis   . Dyslipidemia, goal LDL below 70    Monitored by PCP. On statin (last labs scan from September 2015: TC 149, TG 127, HDL 55, LDL 69)  . ED (erectile dysfunction)   . GERD (gastroesophageal reflux disease)   . History of SBO (small bowel obstruction) March 2015    Abdominal surgery with LOA  . Hx of irritable bowel syndrome   . Hypertension, essential   . Lesion of right native kidney  08/20/2013   Abdominal US : 2.4 cm x 1.7 complex lesion in the upper pole the right kidney in March of 2012 concerning for RCC although enlarging hemorrhagic cyst could have this appearance as well.,   . Monoclonal gammopathy of undetermined significance   . Psoriasis   . S/P AVR (aortic valve replacement) and aortoplasty 2004   Dr. Cyndia BentDeneen Harts procedure - St. Jude aVR (25 mm prosthesis) with aortic root conduit;; Echo 10/'14: EF 55-60%, Mod Conc LVH, Gr 1 DD, Well Seated AoV Mech Prosthesis with normal P gradients (no stenosis), Mod-Severe LA dilation --> follow-up echo November 2016: Well functioning mechanical valve. Paradoxical septal motion. EF 50-55%. Severe LA dilation. Moderate RA dilation.   . S/P CABG x 4 2004   LIMA-LAD, SVG-D1, SVG- PDA-PLA (along with AVR)   CORONARY/GRAFT ANGIOGRAPHY - 04/11/2017 - med Rx; The findings correlate directly with the stress test findings of inferior infarct and anterior infarct.  In the absence of ongoing anginal symptoms,  would not attempt to try PCI of the chronically occluded PDA. The minimal segment of LAD compromising a septal perforator has not changed since bypass. Likely not a major issue.     Past Surgical History:  Procedure Laterality Date  . ABDOMINAL AORTIC ANEURYSM REPAIR    . AORTIC VALVE REPLACEMENT    . ASD REPAIR  12/26/2002   Aortic valve replacement and replacement of aortic root aneurysm using a 25 mm St. JUDE mechanical valve cinduit with reimplantation of the coronary arteeries  . CARDIAC CATHETERIZATION  Aug  13,2004   diffuse  coronary disease,prior to his BENTALL PROCEDURE  . CORONARY ARTERY BYPASS GRAFT  12/26/2002   LIMA to LAD,SVG to diagonal, SVG to PDA and PLA.   Marland Kitchen CORONARY/GRAFT ANGIOGRAPHY N/A 04/21/2017   Procedure: CORONARY/GRAFT ANGIOGRAPHY;  Surgeon: Leonie Man, MD;   Location: [facility] INVASIVE CV LAB: p & mLAD 65% then m100%. Patent LIMA-LAD. 100% SVG-D2 (D1 & D2 fill via native flow. RCA-RPAV patent, 100% ostrPDA, but SVG-rPDA-RPL patent to PDA then occluded.  Marland Kitchen LAPAROSCOPIC LYSIS OF ADHESIONS  March 2015   performed in [place] for SBO   . NM MYOVIEW LTD  10/04/2011   showed no ischemia;EF52%  . PARTIAL COLECTOMY  1994  . TRANSTHORACIC ECHOCARDIOGRAM  02/2017   [facility]) technically difficult study.  Mild LVH.  Mildly reduced EF of 45-50%.  1+ MR and TR.  Severe LA dilation.  Moderate RV dilation.  Unable to assess diastolic function.  RVP 42 mmHg suggesting mild-moderate pulmonary hypertension.  Mechanical Saint Jude Aortic Valve Prosthesis not well visualized, but gradient 5 mmHg.  Marland Kitchen TRANSTHORACIC ECHOCARDIOGRAM  03/2015   follow-up echo November 2016: Well functioning mechanical valve. Paradoxical septal motion. EF 50-55%. Severe LA dilation. Moderate RA dilation.   . VENTRAL HERNIA REPAIR     repair with mesh by Dr Collene Mares at [place]  . amLODipine (NORVASC) 10 MG tablet TAKE 1  TABLET EVERY DAY  . ampicillin (PRINCIPEN) 500 MG capsule Take 500 mg by mouth daily.   . DENTA 5000 PLUS 1.1 % CREA dental cream Place 1 Squirt onto teeth daily.  . Esomeprazole Magnesium (NEXIUM PO) Take by mouth.  . ferrous sulfate 325 (65 FE) MG tablet Take 1 tablet by mouth 2 (two) times daily.  . furosemide (LASIX) 20 MG tablet TAKE 1 TO 2 TABLETS A DAY AS NEEDED  . nitroGLYCERIN (NITROSTAT) 0.4 MG SL tablet 1 TAB(S) EVERY 5 MINUTES X 3 AS NEEDED FOR CHEST PAIN AND CALL 911 SUBLINGUALLY  . potassium chloride SA (K-DUR,KLOR-CON) 20 MEQ tablet Take 1 tablet (20 mEq total) by mouth 2 (two) times daily.  . simvastatin (ZOCOR) 20 MG tablet TAKE 1 TABLET AT BEDTIME  . tamsulosin (FLOMAX) 0.4 MG CAPS capsule Take 0.4 mg by mouth 2 (two) times daily.   . valsartan-hydrochlorothiazide (DIOVAN-HCT) 320-12.5 MG tablet TAKE 1 TABLET EVERY DAY  .  warfarin (COUMADIN) 5 MG tablet Take 7.5-10 mg by mouth See admin instructions. 10 mg on Sundays and 7.5 mg all other days  . [DISCONTINUED] amiodarone (PACERONE) 200 MG tablet Take 0.5 tablets (100 mg total) by mouth daily.  . [DISCONTINUED] aspirin 81 MG tablet Take 81 mg by mouth daily.  . [DISCONTINUED] isosorbide mononitrate (IMDUR) 30 MG 24 hr tablet Take 2 tablets (60 mg total) by mouth as directed.  . [DISCONTINUED] metoprolol succinate (TOPROL XL) 25 MG 24 hr tablet Take 1 tablet (25 mg total) by mouth daily.    Allergies  Allergen Reactions  . Doxycycline Rash    Social History   Tobacco Use  . Smoking status: Former Smoker    Last attempt to quit: 06/16/1997    Years since quitting: 20.3  . Smokeless tobacco: Never Used  Substance Use Topics  . Alcohol use: Yes  . Drug use: No   Social History   Social History Narrative   Married gentleman with no children. Quit smoking in 1998.   Education: 2 years of college. He does drink caffeine.  He drinks social beer.   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 Claiborne Billings in [place]; GI: Dr. Janus Molder in [place]    family history includes COPD in his sister; Colon cancer in his mother; Congestive Heart Failure in his mother; Lung cancer in his father.  Wt Readings from Last 3 Encounters:  10/10/17 210 lb 12.8 oz (95.6 kg)  08/29/17 211 lb (95.7 kg)  08/22/17 210 lb (95.3 kg)    PHYSICAL EXAM BP (!) 118/57   Pulse 74   Ht 5\' 11"  (1.803 m)   Wt 210 lb 12.8 oz (95.6 kg)   BMI 29.40 kg/m  Physical Exam  Constitutional: He is oriented to person, place, and time. He appears well-developed and well-nourished. No distress.  Well-groomed.  HENT:  Head: Normocephalic and atraumatic.  Neck: Normal range of motion. Neck supple. No hepatojugular reflux and no JVD  present. Carotid bruit is not present.  Cardiovascular: Normal rate, regular rhythm and normal pulses.  Occasional extrasystoles are present. PMI is not displaced. Exam reveals no gallop, no S4 and no friction rub.  Murmur heard.  Medium-pitched harsh crescendo-decrescendo midsystolic murmur is present with a grade of 2/6 at the upper right sternal border radiating to the neck. Metallic aortic  valve click  Abdominal: Soft. Bowel sounds are normal. He exhibits no distension. There is no tenderness. There is no rebound.  Musculoskeletal: Normal range of motion. He exhibits no edema.  Neurological: He is alert and oriented to person, place, and time.  Skin:  Diffuse areas of ecchymosis on bilateral arms, less so on legs.  Psychiatric: He has a normal mood and affect. His behavior is normal. Judgment and thought content normal.  Nursing note and vitals reviewed.   Adult ECG Report Not checked  Other studies Reviewed: Additional studies/ records that were reviewed today include:  Recent Labs:   No results found for: CHOL, HDL, LDLCALC, LDLDIRECT, TRIG, CHOLHDL Lab Results  Component Value Date   CREATININE 0.95 04/21/2017   BUN 10 04/21/2017   NA 134 (L) 04/21/2017   K 4.4 04/21/2017   CL 101 04/21/2017   CO2 27 04/21/2017    ASSESSMENT / PLAN:  Erik Meyer continues to be troubled by fatigue.  Chronotropic incompetence which was clearly not the case.  He has had an echocardiogram showing relatively normal function, cardiac cath showing not much need to be done for re-vascular station standpoint.  With his fatigue, I think the main new medicine that he has been on his amiodarone.   At this point I think it would like for to have him wean off the amiodarone and simply use that for breakthrough episodes of A. fib.  We will go up on his metoprolol dose to cover. --He remains on warfarin for his mechanical aortic valve and therefore could undergo DCCV if the amiodarone does not chemically  cardiovert him.   He does not have that much the way of any angina and I think we can probably wean off the Imdur. Since he now has no longer issues with edema I think we can make his Lasix as needed. With significant bruising we can stop his aspirin.  Problem List Items Addressed This Visit    Persistent atrial fibrillation ([place]); CHA2DS2-VASc Score 4 (Chronic)    He was successfully cardioverted with a combination of amiodarone blocker.  With continued fatigue, I was concerned about chronotropic incompetence, but he clearly was able to get his heart rate up with reduced beta-blocker and amiodarone dose. Plan: .  To avoid long-term complications of amiodarone, we will stop amiodarone use for PRN breakthrough episodes of A. Fib.  Go back to 50 mg Toprol.  Continue warfarin.       Relevant Medications   amiodarone (PACERONE) 200 MG tablet   isosorbide mononitrate (IMDUR) 30 MG 24 hr tablet   metoprolol succinate (TOPROL-XL) 50 MG 24 hr tablet   Hypertension, essential (Chronic)    Blood pressure looks good.  Should be okay with increasing back to 50 of Toprol.  Otherwise no change.      Relevant Medications   amiodarone (PACERONE) 200 MG tablet   isosorbide mononitrate (IMDUR) 30 MG 24 hr tablet   metoprolol succinate (TOPROL-XL) 50 MG 24 hr tablet   Fatigue due to treatment - Primary    Detailed instructions noted and patient instructions segment:  STOP AMIODARONE now  Only use if you have break through  afib - take 400 mg twice a day  For 7 days- if afib continues contact office.  In 1 week after stopping Amiodarone , increase metoprolol succinate ( Toprol XL ) 50 mg  Daily.        Dyslipidemia, goal LDL below 70 (Chronic)    Remains on simvastatin.  Did not  notice improvement in symptoms of fatigue.  Followed by PCP.      Relevant Medications   amiodarone (PACERONE) 200 MG tablet   isosorbide mononitrate (IMDUR) 30 MG 24 hr tablet   metoprolol succinate (TOPROL-XL)  50 MG 24 hr tablet   Chronic diastolic heart failure (HCC) (Chronic)    Seems to be euvolemic, if anything may be a little bit dry.  I think we can go back to using Lasix as PRN med as opposed to standing medication.  His ER visit with constipation was probably related to some mild dehydration.  Less of an issue especially over the summer months) as needed as opposed to standing dose Lasix.      Relevant Medications   amiodarone (PACERONE) 200 MG tablet   isosorbide mononitrate (IMDUR) 30 MG 24 hr tablet   metoprolol succinate (TOPROL-XL) 50 MG 24 hr tablet   CAD, multiple vessel (Chronic)    Multivessel disease.  Does not seem to be having a lot of angina.  Can try to wean off Imdur for now.  He still on metoprolol which we will increase the dose and high-dose amlodipine as well.  He wants to see if he can tolerate being off Imdur, so we will attempt to wean off.      Relevant Medications   amiodarone (PACERONE) 200 MG tablet   isosorbide mononitrate (IMDUR) 30 MG 24 hr tablet   metoprolol succinate (TOPROL-XL) 50 MG 24 hr tablet      Current medicines are reviewed at length with the patient today. (+/- concerns) n/a The following changes have been made: see below.  Patient Instructions  Medication Instructions  STOP AMIODARONE now Only use if you have break through  afib - take 400 mg twice a day  For 7 days- if afib continues contact office.  In 1 week after stopping Amiodarone , increase metoprolol succinate ( Toprol XL ) 50 mg  Daily.  Change LASIX ( furosemide) to take as needed.  Stop taking Aspirin 81 mg   Decrease  Imdur 30 mg ( 1 tablet daily) if no changes after 2weeks then stop taking Imdur ( isosorbide mono).    Your physician wants you to follow-up in OCT 2019 [place].You will receive a reminder letter in the mail two months in advance. If you don't receive a letter, please call our office to schedule the follow-up appointment.  If you need a refill  on your cardiac medications before your next appointment, please call your pharmacy.    Studies Ordered:   No orders of the defined types were placed in this encounter.     Glenetta Hew, M.D., M.S. Interventional Cardiologist   Pager # [phone] Phone # [phone] [address] [place], [place] [address]   Thank you for choosing Heartcare at [facility]!!

## 2017-10-10 NOTE — Assessment & Plan Note (Signed)
Blood pressure looks good.  Should be okay with increasing back to 50 of Toprol.  Otherwise no change.

## 2017-10-10 NOTE — Patient Instructions (Addendum)
Medication Instructions  STOP AMIODARONE now Only use if you have break through  afib - take 400 mg twice a day  For 7 days- if afib continues contact office.  In 1 week after stopping Amiodarone , increase metoprolol succinate ( Toprol XL ) 50 mg  Daily.  Change LASIX ( furosemide) to take as needed.  Stop taking Aspirin 81 mg   Decrease  Imdur 30 mg ( 1 tablet daily) if no changes after 2weeks then stop taking Imdur ( isosorbide mono).    Your physician wants you to follow-up in OCT 2019 [place].You will receive a reminder letter in the mail two months in advance. If you don't receive a letter, please call our office to schedule the follow-up appointment.  If you need a refill on your cardiac medications before your next appointment, please call your pharmacy.

## 2017-10-10 NOTE — Assessment & Plan Note (Signed)
Remains on simvastatin.  Did not notice improvement in symptoms of fatigue.  Followed by PCP.

## 2017-10-18 ENCOUNTER — Other Ambulatory Visit: Payer: Self-pay | Admitting: Cardiology

## 2017-10-18 LAB — PROTIME-INR: INR: 3 — AB (ref 0.9–1.1)

## 2017-10-19 LAB — PROTIME-INR
INR: 3 — ABNORMAL HIGH (ref 0.8–1.2)
Prothrombin Time: 29.3 s — ABNORMAL HIGH (ref 9.1–12.0)

## 2017-10-20 ENCOUNTER — Ambulatory Visit (INDEPENDENT_AMBULATORY_CARE_PROVIDER_SITE_OTHER): Payer: Medicare HMO | Admitting: Pharmacist Clinician (PhC)/ Clinical Pharmacy Specialist

## 2017-10-20 DIAGNOSIS — Z952 Presence of prosthetic heart valve: Secondary | ICD-10-CM | POA: Diagnosis not present

## 2017-10-20 DIAGNOSIS — Z7901 Long term (current) use of anticoagulants: Secondary | ICD-10-CM | POA: Diagnosis not present

## 2017-11-01 ENCOUNTER — Other Ambulatory Visit: Payer: Self-pay | Admitting: Cardiology

## 2017-11-01 LAB — POCT INR: INR: 2.9 (ref 2.0–3.0)

## 2017-11-01 LAB — PROTIME-INR: INR: 2.9 — AB (ref <=1.1)

## 2017-11-02 ENCOUNTER — Ambulatory Visit (INDEPENDENT_AMBULATORY_CARE_PROVIDER_SITE_OTHER): Payer: Medicare HMO | Admitting: Pharmacist

## 2017-11-02 DIAGNOSIS — Z952 Presence of prosthetic heart valve: Secondary | ICD-10-CM

## 2017-11-02 DIAGNOSIS — Z7901 Long term (current) use of anticoagulants: Secondary | ICD-10-CM | POA: Diagnosis not present

## 2017-11-02 LAB — PROTIME-INR
INR: 2.9 — ABNORMAL HIGH (ref 0.8–1.2)
PROTHROMBIN TIME: 28.8 s — AB (ref 9.1–12.0)

## 2017-11-29 ENCOUNTER — Other Ambulatory Visit: Payer: Self-pay | Admitting: Cardiology

## 2017-11-29 LAB — PROTIME-INR: INR: 3.5 — AB (ref <=1.1)

## 2017-11-30 ENCOUNTER — Ambulatory Visit (INDEPENDENT_AMBULATORY_CARE_PROVIDER_SITE_OTHER): Payer: Medicare HMO | Admitting: Pharmacist

## 2017-11-30 DIAGNOSIS — Z7901 Long term (current) use of anticoagulants: Secondary | ICD-10-CM

## 2017-11-30 DIAGNOSIS — Z952 Presence of prosthetic heart valve: Secondary | ICD-10-CM

## 2017-11-30 LAB — PROTIME-INR
INR: 3.5 — AB (ref 0.8–1.2)
PROTHROMBIN TIME: 33.7 s — AB (ref 9.1–12.0)

## 2017-12-28 ENCOUNTER — Other Ambulatory Visit: Payer: Self-pay | Admitting: Cardiology

## 2017-12-29 LAB — PROTIME-INR
INR: 3.6 — AB (ref 0.8–1.2)
PROTHROMBIN TIME: 33.2 s — AB (ref 9.1–12.0)

## 2018-01-02 ENCOUNTER — Ambulatory Visit (INDEPENDENT_AMBULATORY_CARE_PROVIDER_SITE_OTHER): Payer: Medicare HMO | Admitting: Pharmacist Clinician (PhC)/ Clinical Pharmacy Specialist

## 2018-01-02 DIAGNOSIS — I4819 Other persistent atrial fibrillation: Secondary | ICD-10-CM

## 2018-01-02 DIAGNOSIS — Z952 Presence of prosthetic heart valve: Secondary | ICD-10-CM

## 2018-01-02 DIAGNOSIS — I481 Persistent atrial fibrillation: Secondary | ICD-10-CM

## 2018-01-02 DIAGNOSIS — Z7901 Long term (current) use of anticoagulants: Secondary | ICD-10-CM

## 2018-01-18 ENCOUNTER — Encounter: Payer: Self-pay | Admitting: Cardiovascular Disease

## 2018-01-29 ENCOUNTER — Other Ambulatory Visit: Payer: Self-pay | Admitting: Cardiology

## 2018-01-29 LAB — PROTIME-INR: INR: 2.5 — AB (ref <=1.1)

## 2018-01-30 ENCOUNTER — Ambulatory Visit (INDEPENDENT_AMBULATORY_CARE_PROVIDER_SITE_OTHER): Payer: Medicare HMO | Admitting: Pharmacist

## 2018-01-30 DIAGNOSIS — Z7901 Long term (current) use of anticoagulants: Secondary | ICD-10-CM

## 2018-01-30 DIAGNOSIS — Z952 Presence of prosthetic heart valve: Secondary | ICD-10-CM

## 2018-01-30 LAB — PROTIME-INR
INR: 2.5 — ABNORMAL HIGH (ref 0.8–1.2)
PROTHROMBIN TIME: 24.1 s — AB (ref 9.1–12.0)

## 2018-02-20 ENCOUNTER — Ambulatory Visit: Payer: Medicare HMO | Admitting: Cardiology

## 2018-02-20 ENCOUNTER — Encounter: Payer: Self-pay | Admitting: Cardiology

## 2018-02-20 VITALS — BP 138/70 | HR 72 | Ht 71.0 in | Wt 217.0 lb

## 2018-02-20 DIAGNOSIS — I4589 Other specified conduction disorders: Secondary | ICD-10-CM

## 2018-02-20 DIAGNOSIS — I2510 Atherosclerotic heart disease of native coronary artery without angina pectoris: Secondary | ICD-10-CM

## 2018-02-20 DIAGNOSIS — Z952 Presence of prosthetic heart valve: Secondary | ICD-10-CM

## 2018-02-20 DIAGNOSIS — E669 Obesity, unspecified: Secondary | ICD-10-CM

## 2018-02-20 DIAGNOSIS — Z7901 Long term (current) use of anticoagulants: Secondary | ICD-10-CM

## 2018-02-20 DIAGNOSIS — I4819 Other persistent atrial fibrillation: Secondary | ICD-10-CM | POA: Diagnosis not present

## 2018-02-20 DIAGNOSIS — I10 Essential (primary) hypertension: Secondary | ICD-10-CM

## 2018-02-20 DIAGNOSIS — I25709 Atherosclerosis of coronary artery bypass graft(s), unspecified, with unspecified angina pectoris: Secondary | ICD-10-CM

## 2018-02-20 DIAGNOSIS — E785 Hyperlipidemia, unspecified: Secondary | ICD-10-CM

## 2018-02-20 DIAGNOSIS — I5032 Chronic diastolic (congestive) heart failure: Secondary | ICD-10-CM

## 2018-02-20 MED ORDER — RANOLAZINE ER 500 MG PO TB12: 500.0000 mg | ORAL_TABLET | Freq: Two times a day (BID) | ORAL | 3 refills | Status: DC

## 2018-02-20 MED ORDER — RANOLAZINE ER 500 MG PO TB12: 500.0000 mg | ORAL_TABLET | Freq: Two times a day (BID) | ORAL | 0 refills | Status: DC

## 2018-02-20 NOTE — Patient Instructions (Signed)
Medication Instructions:  RANEXA 500 MG TWICE A DAY  FOR ONE WEEK DECREASE ISOSORBIDE   -TAKE 1/2 TABLET DAILY THEN STOP If you need a refill on your cardiac medications before your next appointment, please call your pharmacy.   Lab work: NOT NEEDED If you have labs (blood work) drawn today and your tests are completely normal, you will receive your results only by: Marland Kitchen MyChart Message (if you have MyChart) OR . A paper copy in the mail If you have any lab test that is abnormal or we need to change your treatment, we will call you to review the results.  Testing/Procedures:  SCHEDULE AT [facility] has requested that you have an echocardiogram. Echocardiography is a painless test that uses sound waves to create images of your heart. It provides your doctor with information about the size and shape of your heart and how well your heart's chambers and valves are working. This procedure takes approximately one hour. There are no restrictions for this procedure.    Follow-Up: At [facility], you and your health needs are our priority.  As part of our continuing mission to provide you with exceptional heart care, we have created designated Provider Care Teams.  These Care Teams include your primary Cardiologist (physician) and Advanced Practice Providers (APPs -  Physician Assistants and Nurse Practitioners) who all work together to provide you with the care you need, when you need it. . Your physician recommends that you schedule a follow-up appointment in:  [place].  Any Other Special Instructions Will Be Listed Below (If Applicable).

## 2018-02-20 NOTE — Progress Notes (Addendum)
PCP: Ivan Anchors, MD  Clinic Note: Chief Complaint  Patient presents with  . Follow-up    Less fatigue since stopping amiodarone, headache from Imdur.  . Coronary Artery Disease    Controlled class I angina on Imdur, but headache  . Atrial Fibrillation    No breakthrough episodes after stopping amiodarone.    HPI: Erik Meyer is a 77 y.o. male with a PMH notable for multivessel CAD with CABG-AVR with aortoplasty in 2004 along with PAF and now significant fatigue who presents for 5-month follow-up.  He has recently been diagnosed with A. fib that complicates his cardiomyopathy.  He underwent cardiac catheterization for an abnormal stress test in December.  Erik Meyer was last seen back in June after his GXT evaluation and for ER visit follow-up.  He was still noting fatigue.  But probably not related to chronotropic incompetence.  Echocardiogram was pretty normal and cardiac catheterization showed basically no revascularization options.  We weaned off his amiodarone and use for breakthrough only.  I did increase his metoprolol dose however.  Remains on warfarin.  No longer noting any edema so we just made Lasix at PRN.  We also stopped aspirin because of bleeding.  Recent Hospitalizations:   None  Studies Personally Reviewed - (if available, images/films reviewed: From Epic Chart or Care Everywhere)  No new studies  Interval History: Erik Meyer presents today for follow-up okay.  He is not having any more chest pain with routine activity.  He does occasionally note nighttime episodes of discomfort with irregular heartbeats.  But is not sure how bad it really is.  He has not considered taking any nitroglycerin.  He really does not he has had any breakthrough episodes of A. fib.  He is still going to the [facility] Monday Wednesday Friday.  He says he is tired earlier than usual but denies any real short of breath or chest pain with exertion.   He says his fatigue is has definitely  improved since we stopped amiodarone. What he really is is bothered by most is his headaches that have been ever since we started him on Imdur.  He currently has a headache and says is probably why his blood pressure is higher.  He brings up log of his blood pressures ranging from 111/66 mmHg with a rate of 71 bpm up to 136/67 mmHg with a rate of 69 bpm. Edema seems to pretty well controlled and is not really having to use his Lasix very much. No real PND orthopnea.  No syncope/near syncope or TIA/amaurosis fugax.  Notably less bruising since having stopped aspirin. No claudication  ROS: A comprehensive was performed. Review of Systems  Constitutional: Positive for malaise/fatigue. Negative for chills, diaphoresis and fever.  HENT: Negative for nosebleeds.   Respiratory: Negative for cough and shortness of breath.   Cardiovascular: Negative for claudication and leg swelling (Stable).  Gastrointestinal: Negative for abdominal pain, diarrhea and heartburn.  Genitourinary: Negative for hematuria.  Musculoskeletal: Positive for joint pain and myalgias (Legs ache). Negative for falls.  Neurological: Positive for headaches. Negative for dizziness.  Endo/Heme/Allergies: Does not bruise/bleed easily.  Psychiatric/Behavioral: Negative for depression and memory loss. The patient is not nervous/anxious and does not have insomnia.   All other systems reviewed and are negative.   I have reviewed and (if needed) personally updated the patient's problem list, medications, allergies, past medical and surgical history, social and family history.   Past Medical History:  Diagnosis Date  . Anemia   .  CAD, multiple vessel 2004   Found during preop cath for [facility] AVR procedure; Myoview 5/'13: No ischemia or infarct, EF 53% --> cath December 2018: Proximal LAD 65% followed by mid LAD 65% then 100% occlusion.  Patent LIMA-LAD.  SVG-D2 occluded.  RPDA occluded. Seq SVG-r PDA-PL patent to PDA, but the PDA is  occluded as is the proximal native PDA  . COPD (chronic obstructive pulmonary disease) ([place])   . Diverticulosis   . Dyslipidemia, goal LDL below 70    Monitored by PCP. On statin (last labs scan from September 2015: TC 149, TG 127, HDL 55, LDL 69)  . ED (erectile dysfunction)   . GERD (gastroesophageal reflux disease)   . History of SBO (small bowel obstruction) March 2015   Abdominal surgery with LOA  . Hx of irritable bowel syndrome   . Hypertension, essential   . Lesion of right native kidney  08/20/2013   Abdominal US : 2.4 cm x 1.7 complex lesion in the upper pole the right kidney in March of 2012 concerning for RCC although enlarging hemorrhagic cyst could have this appearance as well.,   . Monoclonal gammopathy of undetermined significance   . Psoriasis   . S/P AVR (aortic valve replacement) and aortoplasty 2004   Dr. Cyndia BentDeneen Harts procedure - St. Jude aVR (25 mm prosthesis) with aortic root conduit;; Echo 10/'14: EF 55-60%, Mod Conc LVH, Gr 1 DD, Well Seated AoV Mech Prosthesis with normal P gradients (no stenosis), Mod-Severe LA dilation --> follow-up echo November 2016: Well functioning mechanical valve. Paradoxical septal motion. EF 50-55%. Severe LA dilation. Moderate RA dilation.   . S/P CABG x 4 2004   LIMA-LAD, SVG-D1, SVG- PDA-PLA (along with AVR)   CORONARY/GRAFT ANGIOGRAPHY - 04/11/2017 - med Rx; The findings correlate directly with the stress test findings of inferior infarct and anterior infarct.  In the absence of ongoing anginal symptoms,  would not attempt to try PCI of the chronically occluded PDA. The minimal segment of LAD compromising a septal perforator has not changed since bypass. Likely not a major issue.     Past Surgical History:  Procedure Laterality Date  . ABDOMINAL AORTIC ANEURYSM REPAIR    . AORTIC VALVE REPLACEMENT    . ASD REPAIR  12/26/2002   Aortic valve replacement and replacement of aortic root aneurysm using a 25 mm St. JUDE mechanical valve  cinduit with reimplantation of the coronary arteeries  . CARDIAC CATHETERIZATION  Aug  13,2004   diffuse  coronary disease,prior to his BENTALL PROCEDURE  . CORONARY ARTERY BYPASS GRAFT  12/26/2002   LIMA to LAD,SVG to diagonal, SVG to PDA and PLA.   Marland Kitchen CORONARY/GRAFT ANGIOGRAPHY N/A 04/21/2017   Procedure: CORONARY/GRAFT ANGIOGRAPHY;  Surgeon: Leonie Man, MD;  Location: [facility] INVASIVE CV LAB: p & mLAD 65% then m100%. Patent LIMA-LAD. 100% SVG-D2 (D1 & D2 fill via native flow. RCA-RPAV patent, 100% ostrPDA, but SVG-rPDA-RPL patent to PDA then occluded.  Marland Kitchen EXERCISE TOLERANCE TEST  09/12/2017    5 METS Baseline ST depression.  No new changes with exercise.  Heart rate went to 141 bpm.  Poor exercise tolerance reaching target heart rate at 2:26-minute,.  No chronotropic Incompetence  . LAPAROSCOPIC LYSIS OF ADHESIONS  March 2015   performed in [place] for SBO   . NM MYOVIEW LTD  10/04/2011   showed no ischemia;EF52%  . PARTIAL COLECTOMY  1994  . TRANSTHORACIC ECHOCARDIOGRAM  02/2017   [facility]) technically difficult study.  Mild LVH.  Mildly  reduced EF of 45-50%.  1+ MR and TR.  Severe LA dilation.  Moderate RV dilation.  Unable to assess diastolic function.  RVP 42 mmHg suggesting mild-moderate pulmonary hypertension.  Mechanical Saint Jude Aortic Valve Prosthesis not well visualized, but gradient 5 mmHg.  Marland Kitchen TRANSTHORACIC ECHOCARDIOGRAM  03/2015   follow-up echo November 2016: Well functioning mechanical valve. Paradoxical septal motion. EF 50-55%. Severe LA dilation. Moderate RA dilation.   . VENTRAL HERNIA REPAIR     repair with mesh by Dr Collene Mares at [facility]  . amLODipine (NORVASC) 10 MG tablet TAKE 1 TABLET EVERY DAY  . ampicillin (PRINCIPEN) 500 MG capsule Take 500 mg by mouth daily.   . cyanocobalamin 2000 MCG tablet Take 2,000 mcg by mouth daily.  . DENTA 5000 PLUS 1.1 % CREA dental cream Place 1 Squirt onto teeth daily.  . Esomeprazole  Magnesium (NEXIUM PO) Take by mouth.  . ferrous sulfate 325 (65 FE) MG tablet Take 1 tablet by mouth 2 (two) times daily.  . furosemide (LASIX) 20 MG tablet Take 20 mg by mouth daily.  . isosorbide mononitrate (IMDUR) 30 MG 24 hr tablet Take 1 tablet (30 mg total) by mouth every morning. (Patient taking differently: Take 15 mg by mouth every morning. )  . metoprolol succinate (TOPROL-XL) 50 MG 24 hr tablet Take 1 tablet (50 mg total) by mouth daily. Take with or immediately following a meal.  . nitroGLYCERIN (NITROSTAT) 0.4 MG SL tablet 1 TAB(S) EVERY 5 MINUTES X 3 AS NEEDED FOR CHEST PAIN AND CALL 911 SUBLINGUALLY  . omeprazole (PRILOSEC OTC) 20 MG tablet Take 20 mg by mouth daily.  . potassium chloride SA (K-DUR,KLOR-CON) 20 MEQ tablet Take 1 tablet (20 mEq total) by mouth 2 (two) times daily.  . simvastatin (ZOCOR) 20 MG tablet TAKE 1 TABLET AT BEDTIME  . tamsulosin (FLOMAX) 0.4 MG CAPS capsule Take 0.4 mg by mouth 2 (two) times daily.   . valsartan-hydrochlorothiazide (DIOVAN-HCT) 320-12.5 MG tablet TAKE 1 TABLET EVERY DAY  . warfarin (COUMADIN) 7.5 MG tablet Take 7.5 mg by mouth daily.    Allergies  Allergen Reactions  . Doxycycline Rash    Social History   Tobacco Use  . Smoking status: Former Smoker    Last attempt to quit: 06/16/1997    Years since quitting: 20.7  . Smokeless tobacco: Never Used  Substance Use Topics  . Alcohol use: Yes  . Drug use: No   Social History   Social History Narrative   Married gentleman with no children. Quit smoking in 1998.   Education: 2 years of college. He does drink caffeine.  He drinks social beer.   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 Claiborne Billings in [place]; GI: Dr. Janus Molder in [place]    family history includes COPD in his sister; Colon cancer in his mother; Congestive  Heart Failure in his mother; Lung cancer in his father.  Wt Readings from Last 3 Encounters:  02/20/18 217 lb (98.4 kg)  10/10/17 210 lb 12.8 oz (95.6 kg)  08/29/17 211 lb (95.7 kg)    PHYSICAL EXAM BP 138/70   Pulse 72   Ht 5\' 11"  (1.803 m)   Wt 217 lb (98.4 kg)   BMI 30.27 kg/m  Physical Exam  Constitutional:  He is oriented to person, place, and time. He appears well-developed and well-nourished. No distress.  Well-groomed.  HENT:  Head: Normocephalic and atraumatic.  Neck: Normal range of motion. Neck supple. No hepatojugular reflux and no JVD present. Carotid bruit is not present. No tracheal deviation present. No thyromegaly present.  Cardiovascular: Normal rate, regular rhythm and normal pulses.  Occasional extrasystoles are present. PMI is not displaced. Exam reveals no gallop, no S4 and no friction rub.  Murmur heard.  Medium-pitched harsh crescendo-decrescendo midsystolic murmur is present with a grade of 2/6 at the upper right sternal border radiating to the neck. Metallic aortic valve click  Pulmonary/Chest: Effort normal and breath sounds normal. No respiratory distress. He has no wheezes. He has no rales.  Abdominal: Soft. Bowel sounds are normal. He exhibits no distension. There is no tenderness. There is no rebound.  Musculoskeletal: Normal range of motion. He exhibits no edema.  Neurological: He is alert and oriented to person, place, and time.  Skin:  Diffuse areas of ecchymosis on bilateral arms, less so on legs.  Psychiatric: He has a normal mood and affect. His behavior is normal. Judgment and thought content normal.  Nursing note and vitals reviewed.   Adult ECG Report Not checked  Other studies Reviewed: Additional studies/ records that were reviewed today include:  Recent Labs: January 18, 2018 -labs will be scanned in Epic  Na+ 138, K+ 4.4, Cl- 96, HCO3-22, BUN 11, Cr 0.9, Glu 97, Ca2+ 8.9; AST 19, ALT 13, AlkP 73  CBC: W2.2 (low), H/H 13.2/38.2,  Plt 137  TC 157, TG 92, HDL 66, LDL 73  No results found for: CHOL, HDL, LDLCALC, LDLDIRECT, TRIG, CHOLHDL   ASSESSMENT / PLAN: Erik Meyer still has fatigue, but not as much as it had been.  His energy level has improved since stopping amiodarone.  Now he really only has headaches.  He thankfully has not had any breakthrough A. fib episodes. He does not have that much the way of any angina and I think we can probably wean off the Imdur, and add Ranexa. Edema is pretty well controlled, so I think we can make his Lasix as needed. Less bruising since having stopped aspirin.  Problem List Items Addressed This Visit    Chronic diastolic heart failure (HCC) (Chronic)    Seems euvolemic on exam.  No active signs of heart failure.  I think he is really just intolerant of A. fib with existing diastolic dysfunction.  No PND orthopnea. Lasix now as needed.  He is on good dose of Toprol and ARB along with amlodipine for blood pressure control.      Relevant Medications   furosemide (LASIX) 20 MG tablet   warfarin (COUMADIN) 7.5 MG tablet   ranolazine (RANEXA) 500 MG 12 hr tablet   Other Relevant Orders   ECHOCARDIOGRAM COMPLETE   Chronotropic incompetence   Relevant Orders   ECHOCARDIOGRAM COMPLETE   Coronary artery disease involving coronary bypass graft of native heart with angina pectoris ([place]) - Primary (Chronic)    Multivessel disease - status post CABG x 4: SeqSVG-RPDA, RPL, SVG-D2, LIMA-LAD. -->  Most recent cath in December 2018 showed mid LAD CTO after D2, and PDA CTO at graft insertion site as well as CTO of SVG-D2 and SeqSVG-RPDA-PL.  No good PCI targets.  Graft to diagonal branch probably not necessary because there is adequate flow.  The PL also gets blood flow from a patent RCA. He probably could have angina from the PDA distribution  as well as the D3 distribution.  He is not doing well with Imdur having headaches, so actually switch him indoor to Ranexa for angina.      Relevant  Medications   furosemide (LASIX) 20 MG tablet   warfarin (COUMADIN) 7.5 MG tablet   ranolazine (RANEXA) 500 MG 12 hr tablet   Dyslipidemia, goal LDL below 70 (Chronic)    Lipid levels look pretty good with LDL of 73.  Pretty much close to the goal of less than 70.  Hopefully now he will be become more active and start losing weight  helping his lipids from that standpoint      Relevant Medications   furosemide (LASIX) 20 MG tablet   warfarin (COUMADIN) 7.5 MG tablet   ranolazine (RANEXA) 500 MG 12 hr tablet   Hx of aortic valve replacement, mechanical (Chronic)   Relevant Orders   ECHOCARDIOGRAM COMPLETE   Hypertension, essential (Chronic)    Blood pressure actually looks pretty good today considering he has a headache.  His home reports look better.  I just asked that he switch his medications around a little bit so he takes his metoprolol and amlodipine at night for antianginal benefit and then takes his valsartan-HCTZ in the morning.      Relevant Medications   furosemide (LASIX) 20 MG tablet   warfarin (COUMADIN) 7.5 MG tablet   ranolazine (RANEXA) 500 MG 12 hr tablet   Long term current use of anticoagulant therapy (Chronic)    On warfarin.  Now that he is off aspirin, no further bleeding/bruising.  With aortic valve replacement, he would be okay holding his warfarin without bridging for procedures.  Just needs to let the Coumadin clinic know timing of procedures that they can say when just stop and start.      Obesity (BMI 30-39.9) (Chronic)   Persistent atrial fibrillation (HCC); CHA2DS2-VASc Score 4 (Chronic)    Thankfully, he converted with amiodarone in the past.  At this point I think were better off using amiodarone on as-needed basis and not for maintenance.. Continue Toprol 50 mg. Continue warfarin without aspirin.  If he has more recurrence of A. fib, may need to consider referral for A. fib ablation versus Tikosyn/sotalol      Relevant Medications   furosemide  (LASIX) 20 MG tablet   warfarin (COUMADIN) 7.5 MG tablet   ranolazine (RANEXA) 500 MG 12 hr tablet   Other Relevant Orders   ECHOCARDIOGRAM COMPLETE   S/P AVR (aortic valve replacement) and aortoplasty (Chronic)    Most recent Echo was in 2016. We will check a 2D echocardiogram for follow-up. Continue warfarin for antiregulation.  Okay to hold for procedures such as colonoscopy, just needs to have his Coumadin clinic be aware of the time of the procedure so they can know when to stop and restart.  Discussed the importance of antibiotic prophylaxis for GI and dental procedures.         Current medicines are reviewed at length with the patient today. (+/- concerns) n/a The following changes have been made: see below.  Patient Instructions  Medication Instructions:  RANEXA 500 MG TWICE A DAY  FOR ONE WEEK DECREASE ISOSORBIDE   -TAKE 1/2 TABLET DAILY THEN STOP If you need a refill on your cardiac medications before your next appointment, please call your pharmacy.   Lab work: NOT NEEDED If you have labs (blood work) drawn today and your tests are completely normal, you will receive your results only by: Marland Kitchen MyChart Message (  if you have MyChart) OR . A paper copy in the mail If you have any lab test that is abnormal or we need to change your treatment, we will call you to review the results.  Testing/Procedures:  SCHEDULE AT [place] has requested that you have an echocardiogram. Echocardiography is a painless test that uses sound waves to create images of your heart. It provides your doctor with information about the size and shape of your heart and how well your heart's chambers and valves are working. This procedure takes approximately one hour. There are no restrictions for this procedure.    Follow-Up: At [facility], you and your health needs are our priority.  As part of our continuing mission to provide you with exceptional heart  care, we have created designated Provider Care Teams.  These Care Teams include your primary Cardiologist (physician) and Advanced Practice Providers (APPs -  Physician Assistants and Nurse Practitioners) who all work together to provide you with the care you need, when you need it. . Your physician recommends that you schedule a follow-up appointment in:  [place].  Any Other Special Instructions Will Be Listed Below (If Applicable).      Studies Ordered:   Orders Placed This Encounter  Procedures  . ECHOCARDIOGRAM COMPLETE    ADDENDUM -- I was not aware that he had an Echocardiogram done last year @ [facility].   [facility] [address]+-----------------+ +------------------------------------+ [facility]: : ::[place],: : :: [place] [address] : : :[phone] : ::Fax [phone] : +------------------------------------+ [phone] : : +-----------------+ Echocardiogram Report +--------------------------------------------------------------------------+ :Name: Halina Maidens Date: 03/06/2017 07:58 AM: :Patient Location: [facility] ICCU^[id number]: 117/56 mmHg: :MRN: [id number] : :DOB: [birth year]Height: 46 in: :Gender: Male Weight: 209  lb : :Race: White or Caucasian: :Age: 14 yrsBSA: 2.1 m2: :Reason For Study: ^AFib : :History: AVR, CAD,HTN,AFIB,CHF,ANEMIA,FORMER SMOKER : :Referring Physician: Olin Hauser: :Performed By: Starlyn Skeans, BS,RDCS,RCS: +--------------------------------------------------------------------------+  Interpretation Summary A complete portable two-dimensional transthoracic echocardiogram with color flow Doppler and Spectral Doppler was performed. The study was technically difficult. The previous echo is from 2005. The left ventricle is mildly dilated. There is mild asymmetric left ventricular hypertrophy. The left ventricular ejection fraction is mildly reduced (45-50%). There is mild (1+) mitral regurgitation. There is mild (1+) tricuspid regurgitation. The left atrium is severely dilated. The right atrium is moderately dilated. Unable to adequately determine diastolic dysfunction. Dilated inferior vena cava suggests increased right atrial pressure. Right ventricular systolic pressure is elevated at 76mmHg, with moderate pulmonary hypertension.   Left Ventricle The left ventricle is mildly dilated. There is mild asymmetric left ventricular hypertrophy. The left ventricular ejection fraction is mildly reduced (45-50%). No obvious regional wall motion abnormalities, mild global hypokinesis. Unable to adequately determine diastolic dysfunction.   Right Ventricle The right ventricle is grossly normal in size and function.  Atria The left atrium is severely dilated. The right atrium is moderately dilated. Dilated inferior vena cava suggests increased right atrial pressure.  Mitral Valve There is mild mitral annular  calcification. The mitral valve is mildly thickened. There is mild (1+) mitral regurgitation.   Tricuspid Valve The tricuspid valve is grossly normal. There is mild (1+) tricuspid regurgitation. Right ventricular systolic pressure is elevated at 30mmHg, with moderate pulmonary hypertension.  Aortic Valve The aortic valve is not well visualized. There is trace aortic regurgitation present. Per chart review ([facility]) patient is status post AVR- 71mm St. Jude and aortic root conduit.  Mean AV gradient 56mm Hg.  Pulmonic Valve The pulmonic valve is not well visualized. There is mild (1+) pulmonic valvular regurgitation.  Vessels The aortic root is normal in diameter.  Pericardium There is no pericardial effusion.  MMode/2D Measurements & Calculations IVSd: 1.4 cmLVIDd: 5.7 cm LVIDs: 4.3 cm LVPWd: 0.97 cm  _____________________________________________________________ LV mass(C)d: 278.9 gramsAo root diam: 2.3 cm  LV mass(C)dI: 129.8 grams/m2Ao root area: 4.0 cm2 LA dimension: 5.4 cm _____________________________________________________________  LVOT diam: 2.0 cm EDV(sp4-el): 64.5 ml LVOT area: 3.0 cm2LVAs ap4: 17.0 cm2 LVLs ap4: 6.4 cm ESV(MOD-sp4): 37.2 ml ESV(sp4-el): 38.2 ml _____________________________________________________________  EDV(MOD-sp2): 87.1 ml SV(MOD-sp4): 24.9 ml EDV(sp2-el): 89.6 ml ESV(MOD-sp2): 52.1 ml ESV(sp2-el): 54.4 ml _____________________________________________________________  SV(sp4-el): 26.2 ml LAV (MOD-bp) Index: 50.9  ml/m2  _____________________________________________________________ LAV(MOD-bp): 109.3 ml  Doppler Measurements & Calculations Ao V2 max: 155.6 cm/sec LV V1 max PG: 2.0 mmHg Ao max PG: 9.7 mmHg LV V1 mean PG: 0.98 mmHg Ao V2 mean: 107.1 cm/secLV V1 max: 70.8 cm/sec Ao mean PG: 5.1 mmHgLV V1 mean: 46.2 cm/sec Ao V2 VTI: 30.2 cmLV V1 VTI: 13.8 cm  AVA(I,D): 1.4 cm2 AVA(V,D): 1.4 cm2 _____________________________________________________________  SV(LVOT): 41.5 ml TR max vel: 265.4 cm/sec TR max PG: 28.2 mmHg RVSP(TR): 38.2 mmHg _____________________________________________________________  RAP systole: 10.0 mmHg  ____________________________________________________________________________   Electronically signed [id number] [place] on 03/06/2017 09:18 AM Ordering Physician: [id number]^^^^^^    Glenetta Hew, M.D., M.S. Interventional Cardiologist   Pager # [phone] Phone # [phone] [address] [place], [place] [address]   Thank you for choosing [facility] at [facility]!!

## 2018-02-22 ENCOUNTER — Encounter: Payer: Self-pay | Admitting: Cardiology

## 2018-02-22 NOTE — Assessment & Plan Note (Addendum)
Most recent Echo was in 2016. We will check a 2D echocardiogram for follow-up. Continue warfarin for antiregulation.  Okay to hold for procedures such as colonoscopy, just needs to have his Coumadin clinic be aware of the time of the procedure so they can know when to stop and restart.  Discussed the importance of antibiotic prophylaxis for GI and dental procedures.

## 2018-02-22 NOTE — Assessment & Plan Note (Signed)
Seems euvolemic on exam.  No active signs of heart failure.  I think he is really just intolerant of A. fib with existing diastolic dysfunction.  No PND orthopnea. Lasix now as needed.  He is on good dose of Toprol and ARB along with amlodipine for blood pressure control.

## 2018-02-22 NOTE — Assessment & Plan Note (Signed)
Blood pressure actually looks pretty good today considering he has a headache.  His home reports look better.  I just asked that he switch his medications around a little bit so he takes his metoprolol and amlodipine at night for antianginal benefit and then takes his valsartan-HCTZ in the morning.

## 2018-02-22 NOTE — Assessment & Plan Note (Addendum)
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 -->  Most recent cath in December 2018 showed mid LAD CTO after D2, and PDA CTO at graft insertion site as well as CTO of SVG-D2 and SeqSVG-RPDA-PL.  No good PCI targets.  Graft to diagonal branch probably not necessary because there is adequate flow.  The PL also gets blood flow from a patent RCA. He probably could have angina from the PDA distribution as well as the D3 distribution.  He is not doing well with Imdur having headaches, so actually switch him indoor to Ranexa for angina.

## 2018-02-22 NOTE — Assessment & Plan Note (Signed)
Thankfully, he converted with amiodarone in the past.  At this point I think were better off using amiodarone on as-needed basis and not for maintenance.. Continue Toprol 50 mg. Continue warfarin without aspirin.  If he has more recurrence of A. fib, may need to consider referral for A. fib ablation versus Tikosyn/sotalol

## 2018-02-22 NOTE — Assessment & Plan Note (Signed)
On warfarin.  Now that he is off aspirin, no further bleeding/bruising.  With aortic valve replacement, he would be okay holding his warfarin without bridging for procedures.  Just needs to let the Coumadin clinic know timing of procedures that they can say when just stop and start.

## 2018-02-22 NOTE — Assessment & Plan Note (Signed)
Lipid levels look pretty good with LDL of 73.  Pretty much close to the goal of less than 70.  Hopefully now he will be become more active and start losing weight  helping his lipids from that standpoint

## 2018-02-26 NOTE — Telephone Encounter (Addendum)
SENT MESSAGE TO PATIENT , ECHO IS NOT NEEDED THIS YEAR PER DR HARDING-- PATIENT HAD TEST YEAR- PATIENT  SENT COPY FOR  CHL CHART.

## 2018-03-01 ENCOUNTER — Other Ambulatory Visit (HOSPITAL_COMMUNITY): Payer: Self-pay | Admitting: Nurse Practitioner

## 2018-03-01 ENCOUNTER — Other Ambulatory Visit: Payer: Self-pay | Admitting: Cardiology

## 2018-03-01 NOTE — Telephone Encounter (Signed)
Order Providers   Prescribing Provider Encounter Provider  Sherran Needs, NP Juluis Mire, RN  Outpatient Medication Detail    Disp Refills Start End   potassium chloride SA (K-DUR,KLOR-CON) 20 MEQ tablet 180 tablet 2 08/11/2017    Sig - Route: Take 1 tablet (20 mEq total) by mouth 2 (two) times daily. - Oral   Sent to pharmacy as: potassium chloride SA (K-DUR,KLOR-CON) 20 MEQ tablet   E-Prescribing Status: Receipt confirmed by pharmacy (08/11/2017 11:28 AM EDT)   Pharmacy   [facility], [place]

## 2018-03-02 ENCOUNTER — Other Ambulatory Visit: Payer: Self-pay | Admitting: Cardiology

## 2018-03-02 ENCOUNTER — Other Ambulatory Visit (HOSPITAL_COMMUNITY): Payer: Medicare HMO

## 2018-03-03 LAB — PROTIME-INR
INR: 2.4 — AB (ref 0.8–1.2)
PROTHROMBIN TIME: 23.3 s — AB (ref 9.1–12.0)

## 2018-03-06 ENCOUNTER — Ambulatory Visit (INDEPENDENT_AMBULATORY_CARE_PROVIDER_SITE_OTHER): Payer: Medicare HMO | Admitting: Pharmacist

## 2018-03-06 ENCOUNTER — Other Ambulatory Visit (HOSPITAL_COMMUNITY): Payer: Self-pay | Admitting: Nurse Practitioner

## 2018-03-06 DIAGNOSIS — Z7901 Long term (current) use of anticoagulants: Secondary | ICD-10-CM

## 2018-03-06 DIAGNOSIS — Z952 Presence of prosthetic heart valve: Secondary | ICD-10-CM

## 2018-04-03 ENCOUNTER — Other Ambulatory Visit: Payer: Self-pay | Admitting: Cardiology

## 2018-04-03 LAB — PROTIME-INR: INR: 2.4 — AB (ref <=1.1)

## 2018-04-04 ENCOUNTER — Ambulatory Visit (INDEPENDENT_AMBULATORY_CARE_PROVIDER_SITE_OTHER): Payer: Medicare HMO | Admitting: Pharmacist

## 2018-04-04 DIAGNOSIS — Z7901 Long term (current) use of anticoagulants: Secondary | ICD-10-CM

## 2018-04-04 DIAGNOSIS — Z952 Presence of prosthetic heart valve: Secondary | ICD-10-CM | POA: Diagnosis not present

## 2018-04-04 LAB — PROTIME-INR
INR: 2.4 — AB (ref 0.8–1.2)
PROTHROMBIN TIME: 23.1 s — AB (ref 9.1–12.0)

## 2018-04-12 NOTE — Telephone Encounter (Signed)
Very unusual to have a side effect like this with Ranexa.  Let us try to stay off both medications for 2 weeks and then retry the Ranexa.  If that does not work, then I would simply try to increase the Toprol dose to 75 mg twice a day.  If you are not really having much in the way of angina symptoms and I think we can probably hold off on doing anything additional.  If you are, we could also potentially consider using nitroglycerin patches.   Glenetta Hew, MD

## 2018-04-13 ENCOUNTER — Telehealth: Payer: Self-pay | Admitting: *Deleted

## 2018-04-13 NOTE — Telephone Encounter (Signed)
Spoke to patient information given per Dr Ellyn Hack information - 04/09/18  PATIENT VERBALIZED UNDERSTANDING

## 2018-04-30 ENCOUNTER — Other Ambulatory Visit: Payer: Self-pay | Admitting: Cardiology

## 2018-04-30 LAB — PROTIME-INR: INR: 2.1 — AB (ref <=1.1)

## 2018-05-01 ENCOUNTER — Ambulatory Visit (INDEPENDENT_AMBULATORY_CARE_PROVIDER_SITE_OTHER): Payer: Medicare HMO | Admitting: Pharmacist

## 2018-05-01 DIAGNOSIS — Z952 Presence of prosthetic heart valve: Secondary | ICD-10-CM | POA: Diagnosis not present

## 2018-05-01 DIAGNOSIS — Z7901 Long term (current) use of anticoagulants: Secondary | ICD-10-CM

## 2018-05-01 LAB — PROTIME-INR
INR: 2.1 — AB (ref 0.8–1.2)
Prothrombin Time: 20.9 s — ABNORMAL HIGH (ref 9.1–12.0)

## 2018-05-22 ENCOUNTER — Other Ambulatory Visit: Payer: Self-pay | Admitting: Cardiology

## 2018-05-22 LAB — POCT INR: INR: 2 (ref 2.0–3.0)

## 2018-05-23 ENCOUNTER — Ambulatory Visit (INDEPENDENT_AMBULATORY_CARE_PROVIDER_SITE_OTHER): Payer: Medicare HMO | Admitting: Cardiology

## 2018-05-23 DIAGNOSIS — Z952 Presence of prosthetic heart valve: Secondary | ICD-10-CM

## 2018-05-23 DIAGNOSIS — Z7901 Long term (current) use of anticoagulants: Secondary | ICD-10-CM

## 2018-05-23 LAB — PROTIME-INR
INR: 2 — ABNORMAL HIGH (ref 0.8–1.2)
PROTHROMBIN TIME: 19.4 s — AB (ref 9.1–12.0)

## 2018-06-06 ENCOUNTER — Other Ambulatory Visit: Payer: Self-pay | Admitting: Cardiology

## 2018-06-07 ENCOUNTER — Ambulatory Visit (INDEPENDENT_AMBULATORY_CARE_PROVIDER_SITE_OTHER): Payer: Medicare HMO | Admitting: Pharmacist Clinician (PhC)/ Clinical Pharmacy Specialist

## 2018-06-07 DIAGNOSIS — I4819 Other persistent atrial fibrillation: Secondary | ICD-10-CM

## 2018-06-07 DIAGNOSIS — Z952 Presence of prosthetic heart valve: Secondary | ICD-10-CM | POA: Diagnosis not present

## 2018-06-07 DIAGNOSIS — Z7901 Long term (current) use of anticoagulants: Secondary | ICD-10-CM | POA: Diagnosis not present

## 2018-06-07 LAB — PROTIME-INR
INR: 2.2 — ABNORMAL HIGH (ref 0.8–1.2)
Prothrombin Time: 21.7 s — ABNORMAL HIGH (ref 9.1–12.0)

## 2018-06-12 ENCOUNTER — Other Ambulatory Visit: Payer: Self-pay | Admitting: Cardiology

## 2018-06-27 ENCOUNTER — Other Ambulatory Visit: Payer: Self-pay | Admitting: Cardiology

## 2018-06-27 LAB — PROTIME-INR: INR: 3.3 — AB (ref <=1.1)

## 2018-06-27 LAB — POCT INR: INR: 3.3 — AB (ref 2.0–3.0)

## 2018-06-28 ENCOUNTER — Ambulatory Visit (INDEPENDENT_AMBULATORY_CARE_PROVIDER_SITE_OTHER): Payer: Medicare HMO | Admitting: Cardiology

## 2018-06-28 DIAGNOSIS — Z952 Presence of prosthetic heart valve: Secondary | ICD-10-CM

## 2018-06-28 DIAGNOSIS — Z7901 Long term (current) use of anticoagulants: Secondary | ICD-10-CM | POA: Diagnosis not present

## 2018-06-28 LAB — PROTIME-INR
INR: 3.3 — ABNORMAL HIGH (ref 0.8–1.2)
Prothrombin Time: 30.7 s — ABNORMAL HIGH (ref 9.1–12.0)

## 2018-07-02 ENCOUNTER — Other Ambulatory Visit: Payer: Self-pay | Admitting: Cardiovascular Disease

## 2018-07-02 ENCOUNTER — Other Ambulatory Visit: Payer: Self-pay | Admitting: Cardiology

## 2018-07-25 ENCOUNTER — Other Ambulatory Visit: Payer: Self-pay | Admitting: Cardiology

## 2018-07-26 LAB — PROTIME-INR
INR: 4.3 — ABNORMAL HIGH (ref 0.8–1.2)
Prothrombin Time: 39 s — ABNORMAL HIGH (ref 9.1–12.0)

## 2018-07-27 LAB — POCT INR: INR: 2.6 (ref 2.0–3.0)

## 2018-07-30 ENCOUNTER — Ambulatory Visit (INDEPENDENT_AMBULATORY_CARE_PROVIDER_SITE_OTHER): Payer: Medicare HMO | Admitting: Pharmacist

## 2018-07-30 DIAGNOSIS — Z952 Presence of prosthetic heart valve: Secondary | ICD-10-CM | POA: Diagnosis not present

## 2018-07-30 DIAGNOSIS — Z7901 Long term (current) use of anticoagulants: Secondary | ICD-10-CM

## 2018-07-31 ENCOUNTER — Other Ambulatory Visit: Payer: Self-pay | Admitting: *Deleted

## 2018-07-31 ENCOUNTER — Telehealth: Payer: Self-pay | Admitting: Cardiology

## 2018-07-31 MED ORDER — VALSARTAN 320 MG PO TABS: 320.0000 mg | ORAL_TABLET | Freq: Every day | ORAL | 11 refills | Status: DC

## 2018-07-31 NOTE — Telephone Encounter (Signed)
° °  Patient calling, requesting a response to 3/23 MyChart message.

## 2018-07-31 NOTE — Telephone Encounter (Signed)
See mychart encounter dated 07/30/18 for documentation.

## 2018-07-31 NOTE — Telephone Encounter (Signed)
Had issues with hyponatremia.  PCP recommended stopping Diovan-HCTZ.  My response the patient question:  It is not the Diovan by itself, it is the HCTZ component of your medication Diovan-HCTZ.  What I would like to do was just stop the combination medication and put you on Diovan alone.  The HCTZ is the culprit for low sodium.  We will prescribe a prescription for valsartan (Diovan) 320 mg daily.  Stop taking the current medicine.  Glenetta Hew, MD  Rx valsartan 300 mg p.o. daily.  Dispense 90 tablets.  3 refills.

## 2018-08-06 ENCOUNTER — Other Ambulatory Visit: Payer: Self-pay | Admitting: Cardiology

## 2018-08-07 LAB — PROTIME-INR
INR: 2.8 — ABNORMAL HIGH (ref 0.8–1.2)
Prothrombin Time: 28.1 s — ABNORMAL HIGH (ref 9.1–12.0)

## 2018-08-08 ENCOUNTER — Ambulatory Visit (INDEPENDENT_AMBULATORY_CARE_PROVIDER_SITE_OTHER): Payer: Medicare HMO | Admitting: Pharmacist

## 2018-08-08 DIAGNOSIS — Z952 Presence of prosthetic heart valve: Secondary | ICD-10-CM

## 2018-08-08 DIAGNOSIS — Z7901 Long term (current) use of anticoagulants: Secondary | ICD-10-CM

## 2018-08-23 ENCOUNTER — Ambulatory Visit: Payer: Medicare HMO | Admitting: Cardiology

## 2018-09-05 ENCOUNTER — Other Ambulatory Visit: Payer: Self-pay | Admitting: Cardiology

## 2018-09-06 LAB — PROTIME-INR
INR: 3.6 — ABNORMAL HIGH (ref 0.8–1.2)
Prothrombin Time: 36 s — ABNORMAL HIGH (ref 9.1–12.0)

## 2018-09-10 ENCOUNTER — Ambulatory Visit (INDEPENDENT_AMBULATORY_CARE_PROVIDER_SITE_OTHER): Payer: Medicare HMO | Admitting: Pharmacist

## 2018-09-10 DIAGNOSIS — Z952 Presence of prosthetic heart valve: Secondary | ICD-10-CM

## 2018-09-10 DIAGNOSIS — Z7901 Long term (current) use of anticoagulants: Secondary | ICD-10-CM

## 2018-09-25 ENCOUNTER — Other Ambulatory Visit: Payer: Self-pay | Admitting: Cardiology

## 2018-09-25 LAB — PROTIME-INR: INR: 2.2 — AB (ref <=1.1)

## 2018-09-26 ENCOUNTER — Ambulatory Visit (INDEPENDENT_AMBULATORY_CARE_PROVIDER_SITE_OTHER): Payer: Medicare HMO | Admitting: Pharmacist Clinician (PhC)/ Clinical Pharmacy Specialist

## 2018-09-26 ENCOUNTER — Ambulatory Visit: Payer: Medicare HMO | Admitting: Cardiology

## 2018-09-26 DIAGNOSIS — Z7901 Long term (current) use of anticoagulants: Secondary | ICD-10-CM | POA: Diagnosis not present

## 2018-09-26 DIAGNOSIS — Z952 Presence of prosthetic heart valve: Secondary | ICD-10-CM

## 2018-09-26 LAB — PROTIME-INR
INR: 2.2 — ABNORMAL HIGH (ref 0.8–1.2)
Prothrombin Time: 22.5 s — ABNORMAL HIGH (ref 9.1–12.0)

## 2018-10-04 ENCOUNTER — Telehealth: Payer: Self-pay

## 2018-10-04 NOTE — Telephone Encounter (Signed)
Spoke with patient to change office visit to virtual visit. Patient states he just wants to reschedule when all the covid is over. I informed him that it may be a couple of months before we can reschedule him. He stated that he would call to reschedule then.

## 2018-10-05 MED ORDER — VALSARTAN 320 MG PO TABS: 320.0000 mg | ORAL_TABLET | Freq: Every day | ORAL | 0 refills | Status: DC

## 2018-10-10 ENCOUNTER — Telehealth: Payer: Medicare HMO | Admitting: Cardiology

## 2018-10-22 ENCOUNTER — Other Ambulatory Visit: Payer: Self-pay | Admitting: Cardiology

## 2018-10-26 ENCOUNTER — Other Ambulatory Visit: Payer: Self-pay | Admitting: Cardiology

## 2018-10-26 DIAGNOSIS — Z7901 Long term (current) use of anticoagulants: Secondary | ICD-10-CM

## 2018-10-26 DIAGNOSIS — I4819 Other persistent atrial fibrillation: Secondary | ICD-10-CM

## 2018-10-29 ENCOUNTER — Other Ambulatory Visit: Payer: Self-pay | Admitting: Cardiology

## 2018-10-30 LAB — PROTIME-INR
INR: 2.5 — AB (ref 0.9–1.1)
INR: 2.5 — ABNORMAL HIGH (ref 0.8–1.2)
Prothrombin Time: 25.2 s — ABNORMAL HIGH (ref 9.1–12.0)

## 2018-10-31 ENCOUNTER — Ambulatory Visit (INDEPENDENT_AMBULATORY_CARE_PROVIDER_SITE_OTHER): Payer: Medicare HMO | Admitting: Pharmacist Clinician (PhC)/ Clinical Pharmacy Specialist

## 2018-10-31 DIAGNOSIS — Z7901 Long term (current) use of anticoagulants: Secondary | ICD-10-CM | POA: Diagnosis not present

## 2018-10-31 DIAGNOSIS — Z952 Presence of prosthetic heart valve: Secondary | ICD-10-CM | POA: Diagnosis not present

## 2018-11-07 HISTORY — PX: TRANSTHORACIC ECHOCARDIOGRAM: SHX275

## 2018-11-20 NOTE — Telephone Encounter (Signed)
Sent message -   Good Afternoon, Mr Monds   There is a form that you will needed to fill out  [employer] Application.   You should be able to download and print form or  If possible go by and pick one up from where you would get your license tag renewed.  Once  Completed you can bring by office for Dr Ellyn Hack to fill out his part and we will contact you once it is ready or have your  Primary to fill out  . You will need to take it back to  [place].   The information you mention in this message about you being fatigue is this something new , if so  I think you need see Dr Ellyn Hack sooner than Sept 2020.  Please reach out to me if appointment is needed sooner.  Thanks Ivin Booty RN     Awaiting  for patient  To respond to [employer]

## 2018-11-21 ENCOUNTER — Telehealth: Payer: Self-pay | Admitting: Cardiology

## 2018-11-21 DIAGNOSIS — Z952 Presence of prosthetic heart valve: Secondary | ICD-10-CM

## 2018-11-21 DIAGNOSIS — I4819 Other persistent atrial fibrillation: Secondary | ICD-10-CM

## 2018-11-21 DIAGNOSIS — I4589 Other specified conduction disorders: Secondary | ICD-10-CM

## 2018-11-21 DIAGNOSIS — R5383 Other fatigue: Secondary | ICD-10-CM

## 2018-11-21 DIAGNOSIS — I5032 Chronic diastolic (congestive) heart failure: Secondary | ICD-10-CM

## 2018-11-21 NOTE — Telephone Encounter (Signed)
  Patient states he received a call saying that he needed to call office to schedule an echo and f/u visit with Dr Ellyn Hack immediately. I did not see an order to schedule an echo. Please advise.

## 2018-11-21 NOTE — Telephone Encounter (Signed)
Note per Dr Ellyn Hack - information  Sent to patient,   Why don't we have you come in to be seen sooner than October (I don't have that many clinic days, but even if you come in to see a PA/NP) -- would be nice to see if we can figure out what may be causing this.  -- Possibly reduce the Beta Blocker dose?  We ordered an Echocardiogram last visit, that has not been done -- that would help).    With the Symptoms that you are describing, a Handicap [place] is warranted -- but would need a clinic visit to back it up & get the paperwork down.    ECHO FROM 2019  WAS CANCELLED DUE TO PATIENT HAVING  ECHO COMPLETED IN  10 /29/2018 AT   [facility]    echo order placed  And follow up appointment will be schedule.   SPOKE TO PATIENT - ECHO  AND FOLLOW UP APPOINT [place]. PATIENT AWARE AND VERBALIZED UNDERSTANDING.

## 2018-11-21 NOTE — Telephone Encounter (Signed)
Why don't we have you come in to be seen sooner than October (I don't have that many clinic days, but even if you come in to see a PA/NP) -- would be nice to see if we can figure out what may be causing this.  -- Possibly reduce the Beta Blocker dose?  We ordered an Echocardiogram last visit, that has not been done -- that would help).    With the Symptoms that you are describing, a Handicap Plackard is warranted -- but would need a clinic visit to back it up & get the paperwork down.  Glenetta Hew, MD ===View-only below this line===   ----- Message -----    From: [name]    Sent: 11/18/2018  4:41 PM EDT      To: Glenetta Hew, MD Subject: Non-Urgent Medical Question  I need to apply for a permanent handicap parking card. Since my bout with A Fib  in Oct/Nov 2018 my stamina has diminish considerably. 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   Also as indicated by the MRI I sent you I have had a stroke at some point. I had some memory and cogitative problems in about Feb/Mar of 2019. Kept missing turns while driving and sometimes took the wrong route to a know destination. Once I even had to think long and hard to determine a date. All these symptoms have passed. An additional contributing factor as my COPD  and chronic back problems.  I get tired and fatigued at the slightest excursion, even climbing a flight of stairs requires a stop before reaching the top. When we go to a store like [employer] and have to park a long distance from the door it requires a shopping cart to lean on for the walk.  Please advise.  Thanks  [name]

## 2018-11-26 ENCOUNTER — Other Ambulatory Visit: Payer: Self-pay | Admitting: Cardiology

## 2018-11-27 ENCOUNTER — Ambulatory Visit (HOSPITAL_COMMUNITY): Payer: Medicare HMO | Attending: Cardiology

## 2018-11-27 ENCOUNTER — Ambulatory Visit (INDEPENDENT_AMBULATORY_CARE_PROVIDER_SITE_OTHER): Payer: Medicare HMO | Admitting: Pharmacist Clinician (PhC)/ Clinical Pharmacy Specialist

## 2018-11-27 ENCOUNTER — Other Ambulatory Visit: Payer: Self-pay

## 2018-11-27 DIAGNOSIS — R5383 Other fatigue: Secondary | ICD-10-CM | POA: Insufficient documentation

## 2018-11-27 DIAGNOSIS — Z952 Presence of prosthetic heart valve: Secondary | ICD-10-CM

## 2018-11-27 DIAGNOSIS — I5032 Chronic diastolic (congestive) heart failure: Secondary | ICD-10-CM | POA: Insufficient documentation

## 2018-11-27 DIAGNOSIS — Z7901 Long term (current) use of anticoagulants: Secondary | ICD-10-CM | POA: Diagnosis not present

## 2018-11-27 DIAGNOSIS — I4819 Other persistent atrial fibrillation: Secondary | ICD-10-CM | POA: Diagnosis not present

## 2018-11-27 DIAGNOSIS — I4589 Other specified conduction disorders: Secondary | ICD-10-CM | POA: Diagnosis not present

## 2018-11-27 LAB — PROTIME-INR
INR: 2.6 — ABNORMAL HIGH (ref 0.8–1.2)
Prothrombin Time: 27.2 s — ABNORMAL HIGH (ref 9.1–12.0)

## 2018-11-30 ENCOUNTER — Telehealth: Payer: Self-pay | Admitting: Cardiology

## 2018-11-30 NOTE — Telephone Encounter (Signed)
LVM, reminding pt of her appt on 12-03-18 with Dr Ellyn Hack. I also message for pt to call backto be pre-screened for COVID-19.

## 2018-12-03 ENCOUNTER — Encounter: Payer: Self-pay | Admitting: Cardiology

## 2018-12-03 ENCOUNTER — Other Ambulatory Visit: Payer: Self-pay

## 2018-12-03 ENCOUNTER — Ambulatory Visit (INDEPENDENT_AMBULATORY_CARE_PROVIDER_SITE_OTHER): Payer: Medicare HMO | Admitting: Cardiology

## 2018-12-03 VITALS — BP 124/58 | HR 66 | Temp 97.5°F | Ht 70.0 in | Wt 211.4 lb

## 2018-12-03 DIAGNOSIS — I4589 Other specified conduction disorders: Secondary | ICD-10-CM

## 2018-12-03 DIAGNOSIS — Z951 Presence of aortocoronary bypass graft: Secondary | ICD-10-CM | POA: Diagnosis not present

## 2018-12-03 DIAGNOSIS — Z952 Presence of prosthetic heart valve: Secondary | ICD-10-CM | POA: Diagnosis not present

## 2018-12-03 DIAGNOSIS — I25709 Atherosclerosis of coronary artery bypass graft(s), unspecified, with unspecified angina pectoris: Secondary | ICD-10-CM

## 2018-12-03 DIAGNOSIS — R5383 Other fatigue: Secondary | ICD-10-CM

## 2018-12-03 DIAGNOSIS — I480 Paroxysmal atrial fibrillation: Secondary | ICD-10-CM | POA: Diagnosis not present

## 2018-12-03 DIAGNOSIS — E785 Hyperlipidemia, unspecified: Secondary | ICD-10-CM

## 2018-12-03 DIAGNOSIS — I10 Essential (primary) hypertension: Secondary | ICD-10-CM

## 2018-12-03 MED ORDER — METOPROLOL SUCCINATE ER 50 MG PO TB24: ORAL_TABLET | ORAL | 0 refills | Status: DC

## 2018-12-03 MED ORDER — METOPROLOL SUCCINATE ER 50 MG PO TB24: 50.0000 mg | ORAL_TABLET | Freq: Every day | ORAL | 0 refills | Status: DC

## 2018-12-03 NOTE — Progress Notes (Signed)
PCP: Ivan Anchors, MD  Clinic Note: Chief Complaint  Patient presents with  . Follow-up    Echo follow-up for DOE-fatigue  . Coronary Artery Disease    No angina  . Atrial Fibrillation    No recurrent symptoms    HPI: Erik Meyer is a 78 y.o. male with a PMH notable for multivessel CAD with CABG-AVR with aortoplasty in 2004 along with PAF and now significant fatigue who presents for 71-monthfollow-up.  He has recently been diagnosed with A. fib that complicates his cardiomyopathy.  He underwent cardiac catheterization for an abnormal stress test in December.  Erik KORBERwas last seen back in June after his GXT evaluation and for ER visit follow-up.  He was still noting fatigue.  But probably not related to chronotropic incompetence.  Echocardiogram was pretty normal and cardiac catheterization showed basically no revascularization options.  We weaned off his amiodarone and use for breakthrough only.  I did increase his metoprolol dose however.  Remains on warfarin.  No longer noting any edema so we just made Lasix at PRN.  We also stopped aspirin because of bleeding.  Recent Hospitalizations:   None  Studies Personally Reviewed - (if available, images/films reviewed: From Epic Chart or Care Everywhere)  Transthoracic Echo November 27, 2018: Stable EF 45-50%-moderate HK of apical-anteroseptal wall..  Moderate asymmetric LVH with GR 1 DD.  Normal RV.  RA mildly dilated.  Moderate MAC.  Interval History: Erik Severinpresents today for follow-up okay.  For an earlier than usual visit.  He usually sees me in October.  But he has been noticing that he just getting more more tired and fatigued.  Has to stop after walking 58ft most of because of fatigue but also with some dyspnea.  No chest pain or pressure.  Just feels worn out.  Unfortunately, [facility] since the onset of the COVID-19 restrictions and that seems to be adding  somewhat to his deconditioning. He is not having any exertional dyspnea per se unless he is actually going uphill.  Any incline whatsoever or stairs, it will cause him to have dyspnea.  Says once in a while he will have a little off and on discomfort in his chest, but nothing like his prior anginal symptoms.  Summer in the last several months he had a brain MRI done that suggested prior infarct, and he was not sure of any prior history of stroke. He denies any PND orthopnea.  Trivial edema. No sensation of recurrent A. fib, but he says it really was the onset of A. fib that caused him to start feeling so tired and weak. No significant palpitations.  No syncope or near syncope.  No TIA or amaurosis fugax.  No claudication  ROS: A comprehensive was performed. Review of Systems  Constitutional: Positive for malaise/fatigue. Negative for chills, diaphoresis and fever.  HENT: Negative for nosebleeds.   Respiratory: Negative for cough and shortness of breath (DOE only.).   Cardiovascular: Negative for claudication and leg swelling (Stable).  Gastrointestinal: Negative for abdominal pain, diarrhea and heartburn.  Genitourinary: Negative for hematuria.  Musculoskeletal: Positive for joint pain and myalgias (Legs ache). Negative for falls.  Neurological: Positive for headaches. Negative for dizziness.  Endo/Heme/Allergies: Does not bruise/bleed easily.  Psychiatric/Behavioral: Negative for depression and memory loss. The patient is not nervous/anxious and does not have insomnia.   All other systems reviewed and are negative.  I have reviewed and (if needed) personally updated the patient's problem list, medications, allergies, past medical and surgical history, social and family history.   Past Medical History:  Diagnosis Date  . Anemia   . CAD, multiple vessel 2004   Found during preop cath for [facility] AVR procedure; Myoview 5/'13: No ischemia or infarct, EF 53% --> cath December 2018:  Proximal LAD 65% followed by mid LAD 65% then 100% occlusion.  Patent LIMA-LAD.  SVG-D2 occluded.  RPDA occluded. Seq SVG-r PDA-PL patent to PDA, but the PDA is occluded as is the proximal native PDA  . COPD (chronic obstructive pulmonary disease) ([place])   . Diverticulosis   . Dyslipidemia, goal LDL below 70    Monitored by PCP. On statin (last labs scan from September 2015: TC 149, TG 127, HDL 55, LDL 69)  . ED (erectile dysfunction)   . GERD (gastroesophageal reflux disease)   . History of SBO (small bowel obstruction) March 2015   Abdominal surgery with LOA  . Hx of irritable bowel syndrome   . Hypertension, essential   . Lesion of right native kidney  08/20/2013   Abdominal US : 2.4 cm x 1.7 complex lesion in the upper pole the right kidney in March of 2012 concerning for RCC although enlarging hemorrhagic cyst could have this appearance as well.,   . Monoclonal gammopathy of undetermined significance   . Paroxysmal-persistent atrial fibrillation ([place]); CHA2DS2-VASc Score 4 03/23/2017  . Psoriasis   . S/P AVR (aortic valve replacement) and aortoplasty 2004   Dr. Cyndia BentDeneen Harts procedure - St. Jude aVR (25 mm prosthesis) with aortic root conduit;; Echo 10/'14: EF 55-60%, Mod Conc LVH, Gr 1 DD, Well Seated AoV Mech Prosthesis with normal P gradients (no stenosis), Mod-Severe LA dilation --> follow-up echo November 2016: Well functioning mechanical valve. Paradoxical septal motion. EF 50-55%. Severe LA dilation. Moderate RA dilation.   . S/P CABG x 4 2004   LIMA-LAD, SVG-D1, SVG- PDA-PLA (along with AVR)   Past Surgical History:  Procedure Laterality Date  . ABDOMINAL AORTIC ANEURYSM REPAIR    . AORTIC VALVE REPLACEMENT    . ASD REPAIR  12/26/2002   Aortic valve replacement and replacement of aortic root aneurysm using a 25 mm St. JUDE mechanical valve cinduit with reimplantation of the coronary arteeries  . CARDIAC CATHETERIZATION  Aug  13,2004   diffuse  coronary disease,prior to his  BENTALL PROCEDURE  . CORONARY ARTERY BYPASS GRAFT  12/26/2002   LIMA to LAD,SVG to diagonal, SVG to PDA and PLA.   Marland Kitchen CORONARY/GRAFT ANGIOGRAPHY N/A 04/21/2017   Procedure: CORONARY/GRAFT ANGIOGRAPHY;  Surgeon: Leonie Man, MD;  Location: [facility] INVASIVE CV LAB: p & mLAD 65% then m100%. Patent LIMA-LAD. 100% SVG-D2 (D1 & D2 fill via native flow. RCA-RPAV patent, 100% ostrPDA, but SVG-rPDA-RPL patent to PDA then occluded.  Marland Kitchen EXERCISE TOLERANCE TEST  09/12/2017    5 METS Baseline ST depression.  No new changes with exercise.  Heart rate went to 141 bpm.  Poor exercise tolerance reaching target heart rate at 2:26-minute,.  No chronotropic Incompetence  . LAPAROSCOPIC LYSIS OF ADHESIONS  March 2015   performed in [place] for SBO   . NM MYOVIEW LTD  10/04/2011   showed no ischemia;EF52%  . PARTIAL COLECTOMY  1994  . TRANSTHORACIC ECHOCARDIOGRAM  02/2017   [facility]) technically difficult study.  Mild LVH.  Mildly reduced EF of 45-50%.  1+ MR and TR.  Severe LA dilation.  Moderate RV dilation.  Unable to assess diastolic function.  RVP 42 mmHg suggesting mild-moderate pulmonary hypertension.  Mechanical Saint Jude Aortic Valve Prosthesis not well visualized, but gradient 5 mmHg.  Marland Kitchen TRANSTHORACIC ECHOCARDIOGRAM  11/2018    Stable EF 45-50%-moderate HK of apical-anteroseptal wall..  Moderate asymmetric LVH with GR 1 DD.  Normal RV.  RA mildly dilated.  Moderate MAC.  Marland Kitchen VENTRAL HERNIA REPAIR     repair with mesh by Dr Collene Mares at [place] - 04/11/2017 - med Rx; The findings correlate directly with the stress test findings of inferior infarct and anterior infarct.  In the absence of ongoing anginal symptoms,  would not attempt to try PCI of the chronically occluded PDA. The minimal segment of LAD compromising a septal perforator has not changed since bypass. Likely not a major issue.    Current Meds  Medication Sig  . amLODipine (NORVASC) 10 MG tablet TAKE 1 TABLET  EVERY DAY. DUE FOR FOLLOW UP APPOINTMENT IN APRIL   . ampicillin (PRINCIPEN) 500 MG capsule Take 500 mg by mouth daily.   . busPIRone (BUSPAR) 15 MG tablet Take 15 mg by mouth 3 (three) times daily.  . DENTA 5000 PLUS 1.1 % CREA dental cream Place 1 Squirt onto teeth daily.  . Esomeprazole Magnesium (NEXIUM PO) Take by mouth.  . ferrous sulfate 325 (65 FE) MG tablet Take 1 tablet by mouth 2 (two) times daily.  . furosemide (LASIX) 20 MG tablet TAKE 1 TO 2 TABLETS A DAY AS NEEDED  . LORazepam (ATIVAN) 0.5 MG tablet Take 0.5 mg by mouth daily.  . metoprolol succinate (TOPROL-XL) 50 MG 24 hr tablet TAKE  1/2 TABLET OF 50 MG   AT DINNER TIME DAILY  . nitroGLYCERIN (NITROSTAT) 0.4 MG SL tablet 1 TAB(S) EVERY 5 MINUTES X 3 AS NEEDED FOR CHEST PAIN AND CALL 911 SUBLINGUALLY  . omeprazole (PRILOSEC OTC) 20 MG tablet Take 20 mg by mouth daily.  . potassium chloride SA (K-DUR,KLOR-CON) 20 MEQ tablet TAKE 1 TABLET TWICE DAILY  . ranolazine (RANEXA) 500 MG 12 hr tablet Take 1 tablet (500 mg total) by mouth 2 (two) times daily.  . simvastatin (ZOCOR) 20 MG tablet TAKE 1 TABLET AT BEDTIME. DUE FOR FOLLOW UP APPOINTMENT IN APRIL   . tamsulosin (FLOMAX) 0.4 MG CAPS capsule Take 0.4 mg by mouth 2 (two) times daily.   . Tiotropium Bromide-Olodaterol (STIOLTO RESPIMAT) 2.5-2.5 MCG/ACT AERS Inhale into the lungs daily.  . valsartan (DIOVAN) 320 MG tablet Take 1 tablet (320 mg total) by mouth daily.  Marland Kitchen warfarin (COUMADIN) 5 MG tablet TAKE 1 AND 1/2 TABLETS TO 2 TABLETS DAILY AS DIRECTED BY COUMADIN CLINIC  . warfarin (COUMADIN) 7.5 MG tablet Take 7.5 mg by mouth daily.  . [DISCONTINUED] metoprolol succinate (TOPROL-XL) 50 MG 24 hr tablet TAKE 1 TABLET (50 MG TOTAL) BY MOUTH DAILY. TAKE WITH OR IMMEDIATELY FOLLOWING A MEAL.  . [DISCONTINUED] metoprolol succinate (TOPROL-XL) 50 MG 24 hr tablet Take 1 tablet (50 mg total) by mouth daily with supper. Take with or immediately following a meal.    Allergies  Allergen  Reactions  . Doxycycline Rash    Social History   Tobacco Use  . Smoking status: Former Smoker    Quit date: 06/16/1997    Years since quitting: 21.4  . Smokeless tobacco: Never Used  Substance Use Topics  . Alcohol use: Yes  . Drug use: No   Social History   Social History Narrative   Married  gentleman with no children. Quit smoking in 1998.   Education: 2 years of college. He does drink caffeine.  He drinks social beer.   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 Claiborne Billings in [place]; GI: Dr. Janus Molder in [place]    family history includes COPD in his sister; Colon cancer in his mother; Congestive Heart Failure in his mother; Lung cancer in his father.  Wt Readings from Last 3 Encounters:  12/03/18 211 lb 6 oz (95.9 kg)  02/20/18 217 lb (98.4 kg)  10/10/17 210 lb 12.8 oz (95.6 kg)    PHYSICAL EXAM BP (!) 124/58 (BP Location: Left Arm, Patient Position: Sitting, Cuff Size: Normal)   Pulse 66   Temp (!) 97.5 F (36.4 C)   Ht _0  (1.778 m)   Wt 211 lb 6 oz (95.9 kg)   SpO2 99%   BMI 30.33 kg/m  Physical Exam  Constitutional: He is oriented to person, place, and time. He appears well-developed and well-nourished. No distress.  Well-groomed.   HENT:  Head: Normocephalic and atraumatic.  Neck: Normal range of motion. Neck supple. No hepatojugular reflux and no JVD present. Carotid bruit is not present. No tracheal deviation present. No thyromegaly present.  Cardiovascular: Normal rate, regular rhythm and normal pulses.  Occasional extrasystoles are present. PMI is not displaced. Exam reveals no gallop, no S4 and no friction rub.  Murmur heard.  Medium-pitched harsh crescendo-decrescendo midsystolic murmur is present with a grade of 2/6 at the upper right sternal border radiating to the neck. Metallic aortic  valve click  Pulmonary/Chest: Effort normal and breath sounds normal. No respiratory distress. He has no wheezes. He has no rales.  Abdominal: Soft. Bowel sounds are normal. He exhibits no distension. There is no abdominal tenderness. There is no rebound.  Musculoskeletal: Normal range of motion.        General: No edema.  Neurological: He is alert and oriented to person, place, and time.  Skin: Skin is warm and dry. No erythema.  Diffuse areas of ecchymosis on bilateral arms, less so on legs.  Psychiatric: He has a normal mood and affect. His behavior is normal. Judgment and thought content normal.  Seems a little bit down/depressed today.  Nursing note and vitals reviewed.   Adult ECG Report  Not checked  Other studies Reviewed: Additional studies/ records that were reviewed today include:   No recent labs available  No results found for: CHOL, HDL, LDLCALC, LDLDIRECT, TRIG, CHOLHDL   ASSESSMENT / PLAN: Erik Meyer still has fatigue, but not as much as it had been.  His energy level has improved since stopping amiodarone.  Now he really only has headaches.  He thankfully has not had any breakthrough A. fib episodes. He does not have that much the way of any angina and I think we can probably wean off the Imdur, and add Ranexa. Edema is pretty well controlled, so I think we can make his Lasix as needed. Less bruising since having stopped aspirin.  Problem List Items Addressed This Visit    S/P CABG x 4 - Primary (Chronic)   Relevant Orders   EKG 12-Lead (Completed)   S/P AVR (aortic valve replacement) and aortoplasty (Chronic)   Relevant Orders   EKG 12-Lead (Completed)   Paroxysmal-persistent atrial fibrillation (HCC); CHA2DS2-VASc Score 4 (Chronic)  Maintaining sinus rhythm as far as I can tell.  Unfortunately, ever since he had A. fib, he has been doing poorly. I am concerned about some chronotropic incompetence.  We will reduce Toprol to 25 mg daily. He is on warfarin  already for his valve.Marland Kitchen   He did convert on amiodarone last visit.  The plan will be to continue to try to use amiodarone as needed, but if there is recurrence in the future, may want to consider consideration for A. fib ablation versus Tikosyn/sotalol.      Relevant Medications   metoprolol succinate (TOPROL-XL) 50 MG 24 hr tablet   Hypertension, essential (Chronic)    Blood pressure is okay.  Should be be fine reducing Toprol dose down to 25 mg.  Continue amlodipine and valsartan.  We stopped the HCTZ to allow for PRN Lasix which he is taking every couple days.      Relevant Medications   metoprolol succinate (TOPROL-XL) 50 MG 24 hr tablet   Fatigue due to treatment    Still feels tired and weak.  Not able to walk 1 to 50 feet. Stopping amiodarone as a standing medicine was the first step, now working to reduce Toprol to 25 mg.  (I had previously increased it to 50 in June of last year when we stopped amiodarone, but now will reduce it)  - change and take Toprol at  DINNERTIME -- Decrease to Taking 25 mg( 1/2 TABLET)  DAILY  65-monthhandicap placard paperwork filled out      Relevant Orders   EKG 12-Lead (Completed)   Dyslipidemia, goal LDL below 70 (Chronic)    His labs been followed by PCP.  I do not have the recent labs.  He is on simvastatin.  Apparently his labs should be being evaluated in September.  Because of his extreme fatigue now, I would actually see if he can do a statin holiday, but would like to wait till after his labs are drawn in September.  He will then do a month-long statin holiday.      Relevant Medications   metoprolol succinate (TOPROL-XL) 50 MG 24 hr tablet   Coronary artery disease involving coronary bypass graft of native heart with angina pectoris (HCC) (Chronic)    Essentially occluded vein graft to the diagonal as well as to the distal RCA branches.  The PDA is no longer perfused.  There is also a diagonal that is no longer perfused. I do not  think this is really what is driving his symptoms as he is not having any angina.3  We will reduce dose of beta-blocker, continue amlodipine for potential antianginal benefit.  Not on aspirin or Plavix because of warfarin. Is on 20 mg simvastatin with labs followed by PCP.  We are planning for him to take a statin holiday for 1 month.      Relevant Medications   metoprolol succinate (TOPROL-XL) 50 MG 24 hr tablet   Chronotropic incompetence    I am a little bit worried about him not being on a walk more than 50 feet without getting short of breath.  He is no longer on standing dose of amiodarone, and when I reduce his Toprol to 25 mg.  If this does not work, we need to figure out something else to evaluate his fatigue and exertional dyspnea.         25 minutes spent with the patient.>>  50% of the time was spent in direct patient consultation.  Current medicines are  reviewed at length with the patient today. (+/- concerns) n/a The following changes have been made: see below.  Patient Instructions  Medication Instructions:    - change and take Toprol at  DINNERTIME -- Decrease to Taking 25 mg( 1/2 TABLET)  DAILY   If you need a refill on your cardiac medications before your next appointment, please call your pharmacy.   Lab work:  Not needed  Testing/Procedures: Not  needed  Follow-Up: At [facility], you and your health needs are our priority.  As part of our continuing mission to provide you with exceptional heart care, we have created designated Provider Care Teams.  These Care Teams include your primary Cardiologist (physician) and Advanced Practice Providers (APPs -  Physician Assistants and Nurse Practitioners) who all work together to provide you with the care you need, when you need it. . You will need a follow up appointment in keep appointment for Sept 2020 .  Please call our office 2 months in advance to schedule this appointment.  You may see Glenetta Hew, MD  or one of the following Advanced Practice Providers on your designated Care Team:   . Rosaria Ferries, PA-C . Jory Sims, DNP, ANP  Any Other Special Instructions Will Be Listed Below (If Applicable).   Studies Ordered:   Orders Placed This Encounter  Procedures  . EKG 12-Lead    Glenetta Hew, M.D., M.S. Interventional Cardiologist   Pager # [phone] Phone # [phone] [address]. [place], [place] [address]   Thank you for choosing Heartcare at [facility]!!

## 2018-12-03 NOTE — Patient Instructions (Addendum)
Medication Instructions:    - change and take Toprol at  DINNERTIME -- Decrease to Taking 25 mg( 1/2 TABLET)  DAILY   If you need a refill on your cardiac medications before your next appointment, please call your pharmacy.   Lab work:  Not needed  Testing/Procedures: Not  needed  Follow-Up: At [facility], you and your health needs are our priority.  As part of our continuing mission to provide you with exceptional heart care, we have created designated Provider Care Teams.  These Care Teams include your primary Cardiologist (physician) and Advanced Practice Providers (APPs -  Physician Assistants and Nurse Practitioners) who all work together to provide you with the care you need, when you need it. . You will need a follow up appointment in keep appointment for Sept 2020 .  Please call our office 2 months in advance to schedule this appointment.  You may see Glenetta Hew, MD or one of the following Advanced Practice Providers on your designated Care Team:   . Rosaria Ferries, PA-C . Jory Sims, DNP, ANP  Any Other Special Instructions Will Be Listed Below (If Applicable).

## 2018-12-04 ENCOUNTER — Encounter: Payer: Self-pay | Admitting: Cardiology

## 2018-12-04 ENCOUNTER — Telehealth: Payer: Self-pay | Admitting: *Deleted

## 2018-12-04 NOTE — Assessment & Plan Note (Signed)
Maintaining sinus rhythm as far as I can tell.  Unfortunately, ever since he had A. fib, he has been doing poorly. I am concerned about some chronotropic incompetence.  We will reduce Toprol to 25 mg daily. He is on warfarin already for his valve.Marland Kitchen   He did convert on amiodarone last visit.  The plan will be to continue to try to use amiodarone as needed, but if there is recurrence in the future, may want to consider consideration for A. fib ablation versus Tikosyn/sotalol.

## 2018-12-04 NOTE — Assessment & Plan Note (Signed)
Blood pressure is okay.  Should be be fine reducing Toprol dose down to 25 mg.  Continue amlodipine and valsartan.  We stopped the HCTZ to allow for PRN Lasix which he is taking every couple days.

## 2018-12-04 NOTE — Assessment & Plan Note (Addendum)
Still feels tired and weak.  Not able to walk 1 to 50 feet. Stopping amiodarone as a standing medicine was the first step, now working to reduce Toprol to 25 mg.  (I had previously increased it to 50 in June of last year when we stopped amiodarone, but now will reduce it)  - change and take Toprol at  [facility] -- Decrease to Taking 25 mg( 1/2 TABLET)  DAILY  53-month handicap placard paperwork filled out

## 2018-12-04 NOTE — Assessment & Plan Note (Signed)
His labs been followed by PCP.  I do not have the recent labs.  He is on simvastatin.  Apparently his labs should be being evaluated in September.  Because of his extreme fatigue now, I would actually see if he can do a statin holiday, but would like to wait till after his labs are drawn in September.  He will then do a month-long statin holiday.

## 2018-12-04 NOTE — Assessment & Plan Note (Signed)
Essentially occluded vein graft to the diagonal as well as to the distal RCA branches.  The PDA is no longer perfused.  There is also a diagonal that is no longer perfused. I do not think this is really what is driving his symptoms as he is not having any angina.3  We will reduce dose of beta-blocker, continue amlodipine for potential antianginal benefit.  Not on aspirin or Plavix because of warfarin. Is on 20 mg simvastatin with labs followed by PCP.  We are planning for him to take a statin holiday for 1 month.

## 2018-12-04 NOTE — Assessment & Plan Note (Signed)
I am a little bit worried about him not being on a walk more than 50 feet without getting short of breath.  He is no longer on standing dose of amiodarone, and when I reduce his Toprol to 25 mg.  If this does not work, we need to figure out something else to evaluate his fatigue and exertional dyspnea.

## 2018-12-04 NOTE — Telephone Encounter (Signed)
LEFT MESSAGE  THAT HANDICAPPED  PLACARD FORM WAS SIGNED AND  PLACED IN THE MAIL .  MAY QUESTION MAY CALL BACK

## 2018-12-14 ENCOUNTER — Other Ambulatory Visit: Payer: Self-pay | Admitting: Cardiology

## 2018-12-14 ENCOUNTER — Other Ambulatory Visit (HOSPITAL_COMMUNITY): Payer: Self-pay | Admitting: Nurse Practitioner

## 2018-12-27 ENCOUNTER — Other Ambulatory Visit: Payer: Self-pay | Admitting: Cardiology

## 2018-12-27 LAB — PROTIME-INR: INR: 2.7 — AB (ref <=1.1)

## 2018-12-28 ENCOUNTER — Ambulatory Visit (INDEPENDENT_AMBULATORY_CARE_PROVIDER_SITE_OTHER): Payer: Medicare HMO | Admitting: Cardiology

## 2018-12-28 DIAGNOSIS — Z7901 Long term (current) use of anticoagulants: Secondary | ICD-10-CM | POA: Diagnosis not present

## 2018-12-28 DIAGNOSIS — Z952 Presence of prosthetic heart valve: Secondary | ICD-10-CM | POA: Diagnosis not present

## 2018-12-28 LAB — PROTIME-INR
INR: 2.7 — ABNORMAL HIGH (ref 0.8–1.2)
Prothrombin Time: 27.4 s — ABNORMAL HIGH (ref 9.1–12.0)

## 2019-01-02 ENCOUNTER — Other Ambulatory Visit: Payer: Self-pay | Admitting: Cardiology

## 2019-01-07 ENCOUNTER — Other Ambulatory Visit: Payer: Self-pay | Admitting: Cardiology

## 2019-01-18 ENCOUNTER — Ambulatory Visit (INDEPENDENT_AMBULATORY_CARE_PROVIDER_SITE_OTHER): Payer: Medicare HMO | Admitting: Cardiology

## 2019-01-18 ENCOUNTER — Encounter: Payer: Self-pay | Admitting: Cardiology

## 2019-01-18 ENCOUNTER — Other Ambulatory Visit: Payer: Self-pay

## 2019-01-18 VITALS — BP 139/70 | HR 87 | Temp 96.0°F | Ht 69.0 in | Wt 211.0 lb

## 2019-01-18 DIAGNOSIS — I4589 Other specified conduction disorders: Secondary | ICD-10-CM | POA: Diagnosis not present

## 2019-01-18 DIAGNOSIS — I25709 Atherosclerosis of coronary artery bypass graft(s), unspecified, with unspecified angina pectoris: Secondary | ICD-10-CM

## 2019-01-18 DIAGNOSIS — R5383 Other fatigue: Secondary | ICD-10-CM

## 2019-01-18 DIAGNOSIS — I480 Paroxysmal atrial fibrillation: Secondary | ICD-10-CM

## 2019-01-18 DIAGNOSIS — I5032 Chronic diastolic (congestive) heart failure: Secondary | ICD-10-CM | POA: Diagnosis not present

## 2019-01-18 DIAGNOSIS — E785 Hyperlipidemia, unspecified: Secondary | ICD-10-CM

## 2019-01-18 DIAGNOSIS — I10 Essential (primary) hypertension: Secondary | ICD-10-CM

## 2019-01-18 DIAGNOSIS — Z952 Presence of prosthetic heart valve: Secondary | ICD-10-CM

## 2019-01-18 NOTE — Patient Instructions (Addendum)
Medication Instructions:   - STOP TAKING CHOLESTEROL MEDICATION FOR ONE MONTH -  (STATIN HOLIDAY)  . CONTACT OFFICE IF NO CHANGE CONTINUE WITH SIMVASTATIN , IF YOU NOTICE  A CHANGE WILL  CHANGE MEDICATION TO ANOTHER STATIN ( POSSIBLE ROSUVASTATIN 20 MG ) If you need a refill on your cardiac medications before your next appointment, please call your pharmacy.   Lab work: NOT NEEDED   Testing/Procedures: NOT NEEDED  Follow-Up: At [facility], you and your health needs are our priority.  As part of our continuing mission to provide you with exceptional heart care, we have created designated Provider Care Teams.  These Care Teams include your primary Cardiologist (physician) and Advanced Practice Providers (APPs -  Physician Assistants and Nurse Practitioners) who all work together to provide you with the care you need, when you need it. . You will need a follow up appointment in  6 months- MARCH 2021.  Please call our office 2 months in advance to schedule this appointment.  You may see Glenetta Hew, MD or one of the following Advanced Practice Providers on your designated Care Team:   . Rosaria Ferries, PA-C . Jory Sims, DNP, ANP  Any Other Special Instructions Will Be Listed Below (If Applicable).

## 2019-01-18 NOTE — Progress Notes (Signed)
PCP: Ivan Anchors, MD  Clinic Note: Chief Complaint  Patient presents with   Follow-up   Fatigue    Much better   Coronary Artery Disease    No angina   Atrial Fibrillation    No recurrence    HPI: Erik Meyer is a 78 y.o. male with a PMH notable for multivessel CAD with CABG-AVR with aortoplasty in 2004 along with PAF and now significant fatigue who presents for 62-monthfollow-up.  He has recently been diagnosed with A. fib that complicates his cardiomyopathy.  He underwent cardiac catheterization for an abnormal stress test in December.  LKHAMARI SHEEHANwas last seen back in June after his GXT evaluation and for ER visit follow-up.  He was still noting fatigue.  But probably not related to chronotropic incompetence.  Echocardiogram was pretty normal and cardiac catheterization showed basically no revascularization options.  We weaned off his amiodarone and use for breakthrough only.  I did increase his metoprolol dose however.  Remains on warfarin.  No longer noting any edema so we just made Lasix at PRN.  We also stopped aspirin because of bleeding.  Recent Hospitalizations:   None  Studies Personally Reviewed - (if available, images/films reviewed: From Epic Chart or Care Everywhere)  Transthoracic Echo November 27, 2018: Stable EF 45-50%-moderate HK of apical-anteroseptal wall..  Moderate asymmetric LVH with GR 1 DD.  Normal RV.  RA mildly dilated.  Moderate MAC.  Interval History: Erik Severinpresents today for short follow-up after an unexpected visit for worsening fatigue.  We backed off his Toprol dose to 25 mg and have him take it at dinnertime as opposed to morning.  He says that really his energy has notably improved since he did this.  He did note one episode of irregular heartbeat skipping beats that occurred at night when he first lay down.  This was when he first changed his dose, but since then has been doing relatively well.  He had a presumed UTI about 2  months ago and was treated with antibiotics.  He has been having some abdominal pain since that time but no cardiac symptoms.  His baseline dyspnea is still there and is limited mostly because of his COPD and back pain.  He is not having any exertional chest pain or pressure.  He has been doing a lot more walking however now that his energy is improved.  Up until his back started bothering him, he was walking a little bit most days.  He was really hoping to get back into doing some water aerobics and water walking once the [employer] opens up and is able to get there.  He denies any real cardiac symptoms now. Cardiovascular review of symptoms: positive for - Baseline exertional dyspnea from COPD, rare palpitations but no recurrence of A. fib negative for - edema, orthopnea, paroxysmal nocturnal dyspnea, rapid heart rate, shortness of breath or Syncope/near syncope, TIA chest amaurosis fugax, claudication.  Melena, hematochezia, hematuria or epistaxis.    ROS: A comprehensive was performed. The patient does not have symptoms concerning for COVID-19 infection (fever, chills, cough, or new shortness of breath).  The patient is practicing social distancing.   Review of Systems  Constitutional: Positive for malaise/fatigue. Negative for chills, diaphoresis and fever.  HENT: Negative for nosebleeds.   Respiratory: Negative for cough and shortness of breath (DOE only.).   Cardiovascular: Negative for claudication and leg swelling (Stable).  Gastrointestinal: Negative for abdominal pain, diarrhea and heartburn.  Genitourinary: Negative  for hematuria.  Musculoskeletal: Positive for joint pain and myalgias (Legs ache). Negative for falls.  Neurological: Positive for headaches. Negative for dizziness.  Endo/Heme/Allergies: Does not bruise/bleed easily.  Psychiatric/Behavioral: Negative for depression and memory loss. The patient is not nervous/anxious and does not have insomnia.   All other systems reviewed  and are negative.   I have reviewed and (if needed) personally updated the patient's problem list, medications, allergies, past medical and surgical history, social and family history.   Past Medical History:  Diagnosis Date   Anemia    CAD, multiple vessel 2004   Found during preop cath for [facility] AVR procedure; Myoview 5/'13: No ischemia or infarct, EF 53% --> cath December 2018: Proximal LAD 65% followed by mid LAD 65% then 100% occlusion.  Patent LIMA-LAD.  SVG-D2 occluded.  RPDA occluded. Seq SVG-r PDA-PL patent to PDA, but the PDA is occluded as is the proximal native PDA   COPD (chronic obstructive pulmonary disease) (HCC)    Diverticulosis    Dyslipidemia, goal LDL below 70    Monitored by PCP. On statin (last labs scan from September 2015: TC 149, TG 127, HDL 55, LDL 69)   ED (erectile dysfunction)    GERD (gastroesophageal reflux disease)    History of SBO (small bowel obstruction) March 2015   Abdominal surgery with LOA   Hx of irritable bowel syndrome    Hypertension, essential    Lesion of right native kidney  08/20/2013   Abdominal US : 2.4 cm x 1.7 complex lesion in the upper pole the right kidney in March of 2012 concerning for RCC although enlarging hemorrhagic cyst could have this appearance as well.,    Monoclonal gammopathy of undetermined significance    Paroxysmal-persistent atrial fibrillation ([place]); CHA2DS2-VASc Score 4 03/23/2017   Psoriasis    S/P AVR (aortic valve replacement) and aortoplasty 2004   Dr. Cyndia BentDeneen Harts procedure - St. Jude aVR (25 mm prosthesis) with aortic root conduit;; Echo 10/'14: EF 55-60%, Mod Conc LVH, Gr 1 DD, Well Seated AoV Mech Prosthesis with normal P gradients (no stenosis), Mod-Severe LA dilation --> follow-up echo November 2016: Well functioning mechanical valve. Paradoxical septal motion. EF 50-55%. Severe LA dilation. Moderate RA dilation.    S/P CABG x 4 2004   LIMA-LAD, SVG-D1, SVG- PDA-PLA (along with AVR)    Past Surgical History:  Procedure Laterality Date   ABDOMINAL AORTIC ANEURYSM REPAIR     AORTIC VALVE REPLACEMENT     ASD REPAIR  12/26/2002   Aortic valve replacement and replacement of aortic root aneurysm using a 25 mm St. JUDE mechanical valve cinduit with reimplantation of the coronary arteeries   CARDIAC CATHETERIZATION  Aug  13,2004   diffuse  coronary disease,prior to his Mazie GRAFT  12/26/2002   LIMA to LAD,SVG to diagonal, SVG to PDA and PLA.    CORONARY/GRAFT ANGIOGRAPHY N/A 04/21/2017   Procedure: CORONARY/GRAFT ANGIOGRAPHY;  Surgeon: Leonie Man, MD;  Location: [facility] INVASIVE CV LAB: p & mLAD 65% then m100%. Patent LIMA-LAD. 100% SVG-D2 (D1 & D2 fill via native flow. RCA-RPAV patent, 100% ostrPDA, but SVG-rPDA-RPL patent to PDA then occluded.   EXERCISE TOLERANCE TEST  09/12/2017    5 METS Baseline ST depression.  No new changes with exercise.  Heart rate went to 141 bpm.  Poor exercise tolerance reaching target heart rate at 2:26-minute,.  No chronotropic Incompetence   LAPAROSCOPIC LYSIS OF ADHESIONS  March 2015   performed in  [place] for SBO    NM MYOVIEW LTD  10/04/2011   showed no ischemia;EF52%   PARTIAL COLECTOMY  1994   TRANSTHORACIC ECHOCARDIOGRAM  02/2017   [facility]) technically difficult study.  Mild LVH.  Mildly reduced EF of 45-50%.  1+ MR and TR.  Severe LA dilation.  Moderate RV dilation.  Unable to assess diastolic function.  RVP 42 mmHg suggesting mild-moderate pulmonary hypertension.  Mechanical Saint Jude Aortic Valve Prosthesis not well visualized, but gradient 5 mmHg.   TRANSTHORACIC ECHOCARDIOGRAM  11/2018    Stable EF 45-50%-moderate HK of apical-anteroseptal wall..  Moderate asymmetric LVH with GR 1 DD.  Normal RV.  RA mildly dilated.  Moderate MAC.   VENTRAL HERNIA REPAIR     repair with mesh by Dr Collene Mares at [place] - 04/11/2017 - med Rx; The findings  correlate directly with the stress test findings of inferior infarct and anterior infarct.  In the absence of ongoing anginal symptoms,  would not attempt to try PCI of the chronically occluded PDA. The minimal segment of LAD compromising a septal perforator has not changed since bypass. Likely not a major issue.    Current Meds  Medication Sig   amLODipine (NORVASC) 10 MG tablet TAKE 1 TABLET EVERY DAY. DUE FOR FOLLOW UP APPOINTMENT IN APRIL    ampicillin (PRINCIPEN) 500 MG capsule Take 500 mg by mouth daily.    busPIRone (BUSPAR) 15 MG tablet Take 15 mg by mouth 3 (three) times daily.   DENTA 5000 PLUS 1.1 % CREA dental cream Place 1 Squirt onto teeth daily.   Esomeprazole Magnesium (NEXIUM PO) Take by mouth.   ferrous sulfate 325 (65 FE) MG tablet Take 1 tablet by mouth 2 (two) times daily.   furosemide (LASIX) 20 MG tablet TAKE 1 TO 2 TABLETS EVERY DAY IF NEEDED   LORazepam (ATIVAN) 0.5 MG tablet Take 0.5 mg by mouth daily.   metoprolol succinate (TOPROL-XL) 50 MG 24 hr tablet TAKE 1 TABLET EVERY DAY, WITH OR IMMEDIATELY FOLLOWING A MEAL.   nitroGLYCERIN (NITROSTAT) 0.4 MG SL tablet 1 TAB(S) EVERY 5 MINUTES X 3 AS NEEDED FOR CHEST PAIN AND CALL 911 SUBLINGUALLY   omeprazole (PRILOSEC OTC) 20 MG tablet Take 20 mg by mouth daily.   potassium chloride SA (K-DUR) 20 MEQ tablet TAKE 1 TABLET TWICE DAILY   ranolazine (RANEXA) 500 MG 12 hr tablet Take 1 tablet (500 mg total) by mouth 2 (two) times daily.   simvastatin (ZOCOR) 20 MG tablet TAKE 1 TABLET AT BEDTIME. DUE FOR FOLLOW UP APPOINTMENT IN APRIL    tamsulosin (FLOMAX) 0.4 MG CAPS capsule Take 0.4 mg by mouth 2 (two) times daily.    Tiotropium Bromide-Olodaterol (STIOLTO RESPIMAT) 2.5-2.5 MCG/ACT AERS Inhale into the lungs daily.   valsartan (DIOVAN) 320 MG tablet Take 1 tablet (320 mg total) by mouth daily.   warfarin (COUMADIN) 5 MG tablet TAKE 1 AND 1/2 TABLETS TO 2 TABLETS DAILY AS DIRECTED BY COUMADIN CLINIC    warfarin (COUMADIN) 7.5 MG tablet Take 7.5 mg by mouth daily.    Allergies  Allergen Reactions   Doxycycline Rash    Social History   Tobacco Use   Smoking status: Former Smoker    Quit date: 06/16/1997    Years since quitting: 21.6   Smokeless tobacco: Never Used  Substance Use Topics   Alcohol use: Yes   Drug use: No   Social History   Social History Narrative   Married  gentleman with no children. Quit smoking in 1998.   Education: 2 years of college. He does drink caffeine.  He drinks social beer.   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 Claiborne Billings in [place]; GI: Dr. Janus Molder in [place]    family history includes COPD in his sister; Colon cancer in his mother; Congestive Heart Failure in his mother; Lung cancer in his father.  Wt Readings from Last 3 Encounters:  01/18/19 211 lb (95.7 kg)  12/03/18 211 lb 6 oz (95.9 kg)  02/20/18 217 lb (98.4 kg)    PHYSICAL EXAM BP 139/70    Pulse 87    Temp (!) 96 F (35.6 C)    Ht _0  (1.753 m)    Wt 211 lb (95.7 kg)    SpO2 98%    BMI 31.16 kg/m  Physical Exam  Constitutional: He is oriented to person, place, and time. He appears well-developed and well-nourished. No distress.  Well-groomed.   HENT:  Head: Normocephalic and atraumatic.  Neck: Normal range of motion. Neck supple. No hepatojugular reflux and no JVD present. Carotid bruit is not present. No tracheal deviation present. No thyromegaly present.  Cardiovascular: Normal rate, regular rhythm and normal pulses.  Occasional extrasystoles are present. PMI is not displaced. Exam reveals no gallop, no S4 and no friction rub.  Murmur heard.  Medium-pitched harsh crescendo-decrescendo midsystolic murmur is present with a grade of 2/6 at the upper right sternal border radiating to the neck. Metallic aortic  valve click  Pulmonary/Chest: Effort normal and breath sounds normal. No respiratory distress. He has no wheezes. He has no rales.  Abdominal: Soft. Bowel sounds are normal. He exhibits no distension. There is no abdominal tenderness. There is no rebound.  Musculoskeletal: Normal range of motion.        General: No edema.  Neurological: He is alert and oriented to person, place, and time.  Skin: Skin is warm and dry. No erythema.  Diffuse areas of ecchymosis on bilateral arms, less so on legs.  Psychiatric: He has a normal mood and affect. His behavior is normal. Judgment and thought content normal.  Seems a little bit down/depressed today.  Nursing note and vitals reviewed.   Adult ECG Report  Not checked  Other studies Reviewed: Additional studies/ records that were reviewed today include:  Recent Labs: 01/16/2019  Na+ 130, K+ 4.3, Cl- 92, HCO3-24, BUN 17, Cr 0.92, Glu 101, Ca2+ 9.0; AST 15, ALT 16, AlkP 54,  T Bili 0.6, TP 6.1,Alb 4.2  CBC: W 8.0, H/H 14.6/42.8, Plt 152; A1c 5.1 continue same dose  TC 195, TG 120, HDL 92, LDL 83   ASSESSMENT / PLAN: Harl notes that his fatigue is improved significantly since backing off on his beta-blocker, and stopping amiodarone.  Thankfully, he has not had that much in the way of any real palpitations or recurrence of A. fib.  Blood pressure seems to be up a little bit, and I think we can allow for some permissive hypertension.  He is on high-dose amlodipine and valsartan.  I would be reluctant to add another medication.    --We still have the option of using amiodarone as needed for breakthrough episodes of A. fib, but can also have him come in for cardioversion.  --While his lipids are not quite at  goal, he is still having some myalgias and some fatigue.  Would like to try a 1 month statin holiday.  His symptoms improved at the end of the month, I would simply switch him from simvastatin to rosuvastatin to avoid interactions with amlodipine  but also adverse side effects.  Still notes that his bruising is notably improved since stopping aspirin.  Now only on warfarin. Has fatigue, but not as much as it had been.  His energy level has improved since stopping amiodarone.  Now he really only has headaches.  He thankfully has not had any breakthrough A. fib episodes. He does not have that much the way of any angina and I think we can probably wean off the Imdur, and add Ranexa. Edema is pretty well controlled, so I think we can make his Lasix as needed. Less bruising since having stopped aspirin.  Problem List Items Addressed This Visit    Chronic diastolic heart failure (HCC) (Chronic)    Seems euvolemic on exam.  Is on stable dose of Lasix which he is not taking every day.  Mostly taking as needed couple times a week.  Is on beta-blocker and ARB with adequate afterload reduction. Although his blood pressures little higher today, we are allowing for some permissive hypertension.  He says at home it is usually much better than this.      Chronotropic incompetence    Feels much better with reduced dose of beta-blocker.  I do want to keep him on some beta-blocker but I think this is about as high as 1 ago. As long as is not having breakthrough episodes of A. fib we should be doing okay.      Coronary artery disease involving coronary bypass graft of native heart with angina pectoris ([place]) - Primary (Chronic)    With existing native vessel CAD & Graft disease (SVG-Diag & 2nd limb of SVG-RPL occlusion) - he is doing well.  Angina well controlled on combination of Ranexa, amlodipine & Toprol.  On ARB & statin - but allowing for statin holiday.  Not on ASA any or Plavix - on Warfarin.         Dyslipidemia, goal LDL below 70 (Chronic)    LDL is 83, not quite at goal.  I would like to potentially get him on a different statin than simvastatin 20 mg.  This is the max dose with him being on amlodipine.  Really having still some  fatigue and some myalgias, I would like to give him a 1 month statin holiday.  At the end of this month, depending, we will need to switch him to rosuvastatin 20 mg for better lipid control      Fatigue due to treatment    Probably had some chronotropic incompetence, doing much better since stopping amiodarone and reducing beta-blocker dose.  We will do 1 month statin holiday and hopefully get him off of simvastatin.  Would plan to start rosuvastatin 20 mg      Hx of aortic valve replacement, mechanical (Chronic)   Hypertension, essential (Chronic)    I was a little leery of his blood pressure going up with reducing Toprol dose, but with fatigue and allow for some mild permissive hypertension since he is not having heart failure symptoms.  He tells me that at home his blood pressure is better controlled than this.  Would be reluctant to add a new medication.  He is on max dose of amlodipine and valsartan.      Paroxysmal-persistent  atrial fibrillation ([place]); CHA2DS2-VASc Score 4 (Chronic)    Thankfully, not noticing any recurrent symptoms.  Maybe some mild palpitations but tolerating the reduced dose of Toprol.  Less signs of chronotropic incompetence.  Successful cardioversion in the past with amiodarone leaves this is a good option in addition to facilitated cardioversion for breakthrough spells. Is on warfarin with no bleeding issues.  Better since stopping aspirin.      S/P AVR (aortic valve replacement) and aortoplasty (Chronic)    Echo check this July.  Valve looks pretty good.  EF mildly reduced. Continue warfarin. Continue SBE prophylaxis for procedures such as GI procedures and detailed dental procedures.         COVID-19 Education: The signs and symptoms of COVID-19 were discussed with the patient and how to seek care for testing (follow up with PCP or arrange E-visit).   The importance of social distancing was discussed today.   25 minutes spent with the patient.>>   50% of the time was spent in direct patient consultation.  Current medicines are reviewed at length with the patient today. (+/- concerns) n/a The following changes have been made: see below.  Patient Instructions  Medication Instructions:   - STOP TAKING CHOLESTEROL MEDICATION FOR ONE MONTH -  (STATIN HOLIDAY)  . CONTACT OFFICE IF NO CHANGE CONTINUE WITH SIMVASTATIN , IF YOU NOTICE  A CHANGE WILL  CHANGE MEDICATION TO ANOTHER STATIN ( POSSIBLE ROSUVASTATIN 20 MG ) If you need a refill on your cardiac medications before your next appointment, please call your pharmacy.   Lab work: NOT NEEDED   Testing/Procedures: NOT NEEDED  Follow-Up: At [facility], you and your health needs are our priority.  As part of our continuing mission to provide you with exceptional heart care, we have created designated Provider Care Teams.  These Care Teams include your primary Cardiologist (physician) and Advanced Practice Providers (APPs -  Physician Assistants and Nurse Practitioners) who all work together to provide you with the care you need, when you need it.  You will need a follow up appointment in  6 months- MARCH 2021.  Please call our office 2 months in advance to schedule this appointment.  You may see Glenetta Hew, MD or one of the following Advanced Practice Providers on your designated Care Team:    Rosaria Ferries, PA-C  Jory Sims, DNP, ANP  Any Other Special Instructions Will Be Listed Below (If Applicable).   Studies Ordered:   No orders of the defined types were placed in this encounter.   Glenetta Hew, M.D., M.S. Interventional Cardiologist   Pager # [phone] Phone # [phone] [address] [place], [place] [address]   Thank you for choosing Heartcare at [facility]!!

## 2019-01-20 ENCOUNTER — Encounter: Payer: Self-pay | Admitting: Cardiology

## 2019-01-20 NOTE — Assessment & Plan Note (Signed)
Probably had some chronotropic incompetence, doing much better since stopping amiodarone and reducing beta-blocker dose.  We will do 1 month statin holiday and hopefully get him off of simvastatin.  Would plan to start rosuvastatin 20 mg

## 2019-01-20 NOTE — Assessment & Plan Note (Signed)
Thankfully, not noticing any recurrent symptoms.  Maybe some mild palpitations but tolerating the reduced dose of Toprol.  Less signs of chronotropic incompetence.  Successful cardioversion in the past with amiodarone leaves this is a good option in addition to facilitated cardioversion for breakthrough spells. Is on warfarin with no bleeding issues.  Better since stopping aspirin.

## 2019-01-20 NOTE — Assessment & Plan Note (Addendum)
I was a little leery of his blood pressure going up with reducing Toprol dose, but with fatigue and allow for some mild permissive hypertension since he is not having heart failure symptoms.  He tells me that at home his blood pressure is better controlled than this.  Would be reluctant to add a new medication.  He is on max dose of amlodipine and valsartan.

## 2019-01-20 NOTE — Assessment & Plan Note (Addendum)
Feels much better with reduced dose of beta-blocker.  I do want to keep him on some beta-blocker but I think this is about as high as 1 ago. As long as is not having breakthrough episodes of A. fib we should be doing okay.

## 2019-01-20 NOTE — Assessment & Plan Note (Signed)
Echo check this July.  Valve looks pretty good.  EF mildly reduced. Continue warfarin. Continue SBE prophylaxis for procedures such as GI procedures and detailed dental procedures.

## 2019-01-20 NOTE — Assessment & Plan Note (Addendum)
With existing native vessel CAD & Graft disease (SVG-Diag & 2nd limb of SVG-RPL occlusion) - he is doing well.  Angina well controlled on combination of Ranexa, amlodipine & Toprol.  On ARB & statin - but allowing for statin holiday.  Not on ASA any or Plavix - on Warfarin.

## 2019-01-20 NOTE — Assessment & Plan Note (Signed)
LDL is 83, not quite at goal.  I would like to potentially get him on a different statin than simvastatin 20 mg.  This is the max dose with him being on amlodipine.  Really having still some fatigue and some myalgias, I would like to give him a 1 month statin holiday.  At the end of this month, depending, we will need to switch him to rosuvastatin 20 mg for better lipid control

## 2019-01-20 NOTE — Assessment & Plan Note (Signed)
Seems euvolemic on exam.  Is on stable dose of Lasix which he is not taking every day.  Mostly taking as needed couple times a week.  Is on beta-blocker and ARB with adequate afterload reduction. Although his blood pressures little higher today, we are allowing for some permissive hypertension.  He says at home it is usually much better than this.

## 2019-01-31 ENCOUNTER — Other Ambulatory Visit: Payer: Self-pay | Admitting: Cardiology

## 2019-01-31 LAB — PROTIME-INR: INR: 2.4 — AB (ref 0.9–1.1)

## 2019-02-01 ENCOUNTER — Ambulatory Visit (INDEPENDENT_AMBULATORY_CARE_PROVIDER_SITE_OTHER): Payer: Medicare HMO | Admitting: Pharmacist

## 2019-02-01 DIAGNOSIS — Z952 Presence of prosthetic heart valve: Secondary | ICD-10-CM | POA: Diagnosis not present

## 2019-02-01 DIAGNOSIS — Z7901 Long term (current) use of anticoagulants: Secondary | ICD-10-CM

## 2019-02-01 LAB — PROTIME-INR
INR: 2.4 — ABNORMAL HIGH (ref 0.8–1.2)
Prothrombin Time: 24.7 s — ABNORMAL HIGH (ref 9.1–12.0)

## 2019-02-13 NOTE — Telephone Encounter (Signed)
My chart follow-up notes.  I would stay on the 50 mg dosage.  See how the palpitations do.  Usually they seem to be short-lived.  When they happen, what I want to try to do is cough vigorously, and if that does not work, bear down like you are constipated to have a bowel movement.  These maneuvers usually will help the heart rate.  I think at like to see if we can get you set up to wear an event monitor just to make sure that you are not having a true arrhythmia.  Glenetta Hew, MD  ===View-only below this line===   ----- Message -----      From:Charley Priscille Heidelberg      Sent:02/12/2019  9:59 AM EDT        [id number] Ellyn Hack, MD   Subject:RE: Visit Follow-Up Question  I  had another episode Saturday evening. Heart rate was everywhere, once went up to 150 for brief period, mild chest pains. We were about to head to the ER when everything went normal. I laid down for a few minutes and it stayed normal and has been normal since, still have mild chest pains. [facility] [employer] [facility] on Monday and Friday and I only get worn out walking to the building and the long walk to the pool. [name]   ----- Message -----      Sheliah Hatch, MD      Sent:02/11/2019 10:49 PM EDT        [id number] Priscille Heidelberg   Subject:RE: Visit Follow-Up Question  That sounds reasonable.  If these episodes on happen very often, we may build to see how you do alternating every a day taken 1/2 tablet versus 1 tablet.  Glenetta Hew, MD   ----- Message -----      From:Javarious Priscille Heidelberg      Sent:02/09/2019  9:29 AM EDT        [id number] Yosef Krogh, MD   Subject:Visit Follow-Up Question

## 2019-02-14 ENCOUNTER — Other Ambulatory Visit: Payer: Self-pay | Admitting: Cardiology

## 2019-02-14 LAB — PROTIME-INR: INR: 1.9 — AB (ref 0.9–1.1)

## 2019-02-15 ENCOUNTER — Ambulatory Visit: Payer: Self-pay | Admitting: Pharmacist Clinician (PhC)/ Clinical Pharmacy Specialist

## 2019-02-15 DIAGNOSIS — Z7901 Long term (current) use of anticoagulants: Secondary | ICD-10-CM

## 2019-02-15 DIAGNOSIS — Z952 Presence of prosthetic heart valve: Secondary | ICD-10-CM

## 2019-02-15 LAB — PROTIME-INR
INR: 1.9 — ABNORMAL HIGH (ref 0.9–1.2)
Prothrombin Time: 19.8 s — ABNORMAL HIGH (ref 9.1–12.0)

## 2019-02-21 ENCOUNTER — Other Ambulatory Visit: Payer: Self-pay | Admitting: Cardiology

## 2019-02-22 ENCOUNTER — Ambulatory Visit (INDEPENDENT_AMBULATORY_CARE_PROVIDER_SITE_OTHER): Payer: Medicare HMO | Admitting: Pharmacist Clinician (PhC)/ Clinical Pharmacy Specialist

## 2019-02-22 DIAGNOSIS — Z7901 Long term (current) use of anticoagulants: Secondary | ICD-10-CM

## 2019-02-22 DIAGNOSIS — Z952 Presence of prosthetic heart valve: Secondary | ICD-10-CM | POA: Diagnosis not present

## 2019-02-22 LAB — PROTIME-INR
INR: 2.4 — AB (ref <=1.1)
INR: 2.4 — ABNORMAL HIGH (ref 0.9–1.2)
Prothrombin Time: 24.6 s — ABNORMAL HIGH (ref 9.1–12.0)

## 2019-03-06 ENCOUNTER — Other Ambulatory Visit (INDEPENDENT_AMBULATORY_CARE_PROVIDER_SITE_OTHER): Payer: Medicare HMO

## 2019-03-06 DIAGNOSIS — I25709 Atherosclerosis of coronary artery bypass graft(s), unspecified, with unspecified angina pectoris: Secondary | ICD-10-CM

## 2019-03-06 DIAGNOSIS — I480 Paroxysmal atrial fibrillation: Secondary | ICD-10-CM

## 2019-03-06 NOTE — Telephone Encounter (Signed)
My Chart Encounter:  Okay let us try starting off with a new medicine called rosuvastatin: I want to start taking half tablet 3 days a week for the first week then increase to 1 more day a week each week for the next month.  By the time you get up to daily dose of 1/2 tablet, gradually increase to full tablet starting at 3 days a week (with 1/2 tablet on the other days), increasing additionally 1 more day per week until up to a full tablet every day.  The goal will be that about 2 months into it you will be on the full dose.  We can then recheck lipids after about 2 more months.   Glenetta Hew, MD  ===View-only below this line===   ----- Message -----    From: [name]    Sent: 03/04/2019 12:24 PM EDT      To: Glenetta Hew, MD Subject: Visit Follow-Up Question  When our one month trial of no simvastatin ended I went back to taking it. After 4 days my fatigue deteriorated considerably. [facility] stops instead of the usual one stop. I decided to stop taking it again and after a few days was back at one stop from a handicap spot to the lockers. It would appear a new drug is in order.  I have an appointment in your office on Nov 17 to discuss. Erik Meyer   --- For this plan:  DC simvastatin  New Rx: Rosuvastatin 20 mg tablet; Take 1/2 tablet every other day (3 days/week) for 1 week, then increase to 4 days/week and incrementally up to 7 days a week over the course of next month. Second month take 1/2 tablet alternating with 1 tablet every other day (3 days a week and increasing up every week by 1 day a week of full tablet versus 1/2 tablet until taking full tablet every day week)  Recheck lipid panel in 5 months.   Glenetta Hew, MD

## 2019-03-07 ENCOUNTER — Telehealth: Payer: Self-pay | Admitting: Cardiology

## 2019-03-07 ENCOUNTER — Telehealth: Payer: Self-pay | Admitting: Pharmacist Clinician (PhC)/ Clinical Pharmacy Specialist

## 2019-03-07 MED ORDER — ENOXAPARIN SODIUM 100 MG/ML ~~LOC~~ SOLN: 100.0000 mg | Freq: Two times a day (BID) | SUBCUTANEOUS | 0 refills | Status: DC

## 2019-03-07 NOTE — Telephone Encounter (Signed)
New Message     [facility] Pre-operative Risk Assessment    Request for surgical clearance:  1. What type of surgery is being performed? 2 Lesions removed  2. When is this surgery scheduled? TBD  3. What type of clearance is required (medical clearance vs. Pharmacy clearance to hold med vs. Both)? Pharmacy  4. Are there any medications that need to be held prior to surgery and how long? Coumadin hold or needing a Lovenox bridge  5. Practice name and name of physician performing surgery? Dr. Adair Laundry   6. What is your office phone number [phone]   7.   What is your office fax number [phone]  8.   Anesthesia type (None, local, MAC, general) ? Local, MAC   Jenkins Rouge 03/07/2019, 9:27 AM  _________________________________________________________________   (provider comments below)

## 2019-03-07 NOTE — Telephone Encounter (Signed)
Detail of the surgery is unclear, I have left message with Dr. Adair Laundry office to provide more specific information in regard to the type of lesions, how big, and location in order to figure out bleeding risk.

## 2019-03-07 NOTE — Telephone Encounter (Signed)
   Primary Cardiologist: Glenetta Hew, MD  Chart reviewed as part of pre-operative protocol coverage. Patient was contacted 03/07/2019 in reference to pre-operative risk assessment for pending surgery as outlined below.  EDHER GING was last seen on 01/18/2019 by Dr. Ellyn Hack.  Since that day, Erik Meyer has done well without chest pain or worsening dyspnea. He is exercising twice a week at [facility] doing water aerobic.   Therefore, based on ACC/AHA guidelines, the patient would be at acceptable risk for the planned procedure without further cardiovascular testing.   I will route this recommendation to the requesting party via Epic fax function and remove from pre-op pool.  Please call with questions.    Note to pharmacy, per patient, date of surgery is set for 03/19/2019. Please arrange coumadin clinic visit and lovenox bridge    [place], [place] 03/07/2019, 2:54 PM

## 2019-03-07 NOTE — Telephone Encounter (Signed)
Patient with diagnosis of atrial fibrillation and aortic valve replacement on warfarin for anticoagulation.    Procedure: lipoma removal  Date of procedure: TBD  CHADS2-VASc score of  5 (CHF, HTN, AGE x 2, CAD)  CrCl 103.7 Platelet count 152  Per office protocol, patient can hold warfarin for 5 days prior to procedure.    Patient WILL need bridging with Lovenox (enoxaparin) around procedure.  Patient followed by [facility].  Patient aware to call directly once date has been set.

## 2019-03-07 NOTE — Telephone Encounter (Signed)
Enoxaprin bridging instructions:  Nov 4: Last dose of Coumadin.  Nov 5: No Coumadin or Lovenox.  Nov 6: Inject Lovenox 100 mg in the fatty abdominal tissue at least 2 inches from the belly button twice a day about 12 hours apart, 8am and 8pm rotate sites. No Coumadin.  Nov 7: Inject Lovenox in the fatty tissue every 12 hours, 8am and 8pm. No Coumadin.  Nov 8: Inject Lovenox in the fatty tissue every 12 hours, 8am and 8pm. No Coumadin.  Nov 9: Inject Lovenox in the fatty tissue in the morning at 8 am (No PM dose). No Coumadin.  Nov 10: Procedure Day - No Lovenox - Resume Coumadin 1.5 tablets in the evening unless told otherwise by surgeon.  Nov 11: Resume Lovenox inject in the fatty tissue every 12 hours and take Coumadin 2 tablets  Nov 12: Inject Lovenox in the fatty tissue every 12 hours and take Coumadin 2 tablets   Nov 13: Inject Lovenox in the fatty tissue every 12 hours and take Coumadin 2 tablets  Nov 14: Inject Lovenox in the fatty tissue every 12 hours and take Coumadin 2 tablets  Nov 15: Inject Lovenox in the fatty tissue every 12 hours and take Coumadin 1.5 tablets  Nov 16: Resume previous schedule (1. 5 tablets daily except 2 tablets each Monday, Wednesday and Friday)   Information emailed to patient on 10/29 [email or web address]

## 2019-03-07 NOTE — Telephone Encounter (Signed)
Confirmed with Levada Dy from Dr. Adair Laundry office, the surgery is for removal of 2 mass, most likely Lipoma. R flank mass is about 8 cm in diameter, left leg mass is about 6 cm in diameter.   Clinical pharmacist to review how long to hold coumadin in this patient with h/o persistent afib and mechanical aortic valve.

## 2019-03-07 NOTE — Addendum Note (Signed)
Addended by: Trampus Mcquerry E on: 03/07/2019 03:16 PM   Modules accepted: Orders

## 2019-03-08 MED ORDER — ROSUVASTATIN CALCIUM 20 MG PO TABS: ORAL_TABLET | ORAL | 0 refills | Status: DC

## 2019-03-08 NOTE — Addendum Note (Signed)
Addended by: Vanessa Ralphs on: 03/08/2019 10:44 AM   Modules accepted: Orders

## 2019-03-15 ENCOUNTER — Other Ambulatory Visit: Payer: Self-pay | Admitting: Cardiology

## 2019-03-18 ENCOUNTER — Other Ambulatory Visit: Payer: Self-pay | Admitting: Cardiology

## 2019-03-21 ENCOUNTER — Encounter: Payer: Self-pay | Admitting: Cardiology

## 2019-03-21 ENCOUNTER — Ambulatory Visit (INDEPENDENT_AMBULATORY_CARE_PROVIDER_SITE_OTHER): Payer: Medicare HMO | Admitting: Cardiology

## 2019-03-21 ENCOUNTER — Other Ambulatory Visit: Payer: Self-pay

## 2019-03-21 ENCOUNTER — Ambulatory Visit (INDEPENDENT_AMBULATORY_CARE_PROVIDER_SITE_OTHER): Payer: Medicare HMO | Admitting: Pharmacist

## 2019-03-21 VITALS — BP 135/74 | HR 73 | Ht 69.0 in | Wt 213.0 lb

## 2019-03-21 DIAGNOSIS — E785 Hyperlipidemia, unspecified: Secondary | ICD-10-CM

## 2019-03-21 DIAGNOSIS — I25709 Atherosclerosis of coronary artery bypass graft(s), unspecified, with unspecified angina pectoris: Secondary | ICD-10-CM | POA: Diagnosis not present

## 2019-03-21 DIAGNOSIS — R0789 Other chest pain: Secondary | ICD-10-CM

## 2019-03-21 DIAGNOSIS — I10 Essential (primary) hypertension: Secondary | ICD-10-CM

## 2019-03-21 DIAGNOSIS — E669 Obesity, unspecified: Secondary | ICD-10-CM

## 2019-03-21 DIAGNOSIS — I480 Paroxysmal atrial fibrillation: Secondary | ICD-10-CM | POA: Diagnosis not present

## 2019-03-21 DIAGNOSIS — Z952 Presence of prosthetic heart valve: Secondary | ICD-10-CM | POA: Diagnosis not present

## 2019-03-21 DIAGNOSIS — Z7901 Long term (current) use of anticoagulants: Secondary | ICD-10-CM

## 2019-03-21 DIAGNOSIS — I4589 Other specified conduction disorders: Secondary | ICD-10-CM

## 2019-03-21 DIAGNOSIS — I5032 Chronic diastolic (congestive) heart failure: Secondary | ICD-10-CM

## 2019-03-21 LAB — POCT INR: INR: 1.1 — AB (ref 2.0–3.0)

## 2019-03-21 NOTE — Progress Notes (Signed)
Primary Care Provider: Ivan Anchors, MD Cardiologist: Glenetta Hew, MD Electrophysiologist:   Clinic Note: Chief Complaint  Patient presents with  . Hospitalization Follow-up    2 hours of chest pain with negative troponin, no relief with aspirin nitro  . Coronary Artery Disease    No angina  . Atrial Fibrillation    No recurrence  . Hyperlipidemia    Tolerating current dose of general of Crestor/rosuvastatin, titrating up    HPI:    Erik Meyer is a 78 y.o. male with a PMH notable for MV-CAD-> s/p CABG/AVR/aortoplasty in 1607 complicated by PAF, cardiomyopathy and chronic fatigue who presents today for Hospital f/u -R Flank Mass excision - ER f/u  Erik Meyer was last seen on  September 11 -> GXT did not suggest evidence of chronotropic incompetence.  He has however been weaned off of amiodarone and is on modest dose of Toprol.  Echo showed EF stable at 45 to 50%. --> He noted improved energy level with backing off his beta-blocker 25 mg daily.  Only one episode of irregular heartbeats.  Baseline exertional dyspnea from COPD.  Allow for mild permissive hypertension  Plan to use amiodarone as needed for breakthrough A. fib (potentially for facilitation of cardioversion versus simply chemical cardioversion)  1 month statin holiday with plans to switch from simvastatin to rosuvastatin.  Recent Hospitalizations:   October 6:- ER CP - took NTG x2 -> no real benefit from nitroglycerin.. Also noted fatigue & weakness.  Symptoms of chest, lasted about 2 hours and he ruled out for MI.  He chose not to stay  November 7: Right flank and leg mass excision (at outpatient surgical center)  Reviewed  CV studies:    The following studies were reviewed today: (if available, images/films reviewed: From Epic Chart or Care Everywhere) . None:   Interval History:   Erik Meyer returns here today stating that really since the episode in the emergency room he has done  fairly well.  He was a little bit upset because he had started getting back into doing stuff at the [facility] and doing yard work but because of the recent procedure where he had a cyst removed from his back and leg, he is out of that for a couple weeks.  At this point, he is hoping to get back into doing some of his exercise, but he is limited some by his back pain.  He actually feels much better overall and has not had any further chest discomfort episodes since the ER visit.  He says he had a couple episodes lasting maybe 30 seconds or so of fluttering sensation in his chest that he is been able to break using the vagal maneuvers.  He says that he is now up to one half tab of rosuvastatin 4 days a week and doing relatively well with no significant fatigue or myalgias.  His symptoms have notably improved since switching from simvastatin.  CV Review of Symptoms (Summary)-positive for - rapid heart rate and Improving exertional dyspnea.  No chest pain with the exception of prolonged, likely noncardiac chest pain episode on October 6 negative for - edema, irregular heartbeat, orthopnea, paroxysmal nocturnal dyspnea, shortness of breath or Syncope/near syncope, TIA/amaurosis fugax, claudication.  The patient does not have symptoms concerning for COVID-19 infection (fever, chills, cough, or new shortness of breath).  The patient is practicing social distancing. ++ Masking.  Sparingly goes out groceries/shopping.   REVIEWED OF SYSTEMS   A comprehensive  ROS was performed. Review of Systems  Constitutional: Positive for malaise/fatigue (Has notably improved.). Negative for weight loss.  HENT: Negative for congestion and nosebleeds.   Respiratory: Negative for cough, shortness of breath and wheezing.   Gastrointestinal: Negative for blood in stool, heartburn and melena.  Genitourinary: Negative for hematuria.  Musculoskeletal: Positive for back pain and joint pain (Bilateral hips, especially when he tries  to use stairs or goes up an incline). Negative for myalgias (Notably improved since stopping simvastatin) and neck pain.  Neurological: Positive for headaches. Negative for dizziness and focal weakness.  Endo/Heme/Allergies: Bruises/bleeds easily (Stable to improved).  Psychiatric/Behavioral: Negative for depression and memory loss. The patient has insomnia (Not sleeping well.). The patient is not nervous/anxious.        Having a hard time dealing with his wife's progression of dementia.  He is having to take on more responsibilities around the house which is hard.  He started to get tired of cooking   I have reviewed and (if needed) personally updated the patient's problem list, medications, allergies, past medical and surgical history, social and family history.   PAST MEDICAL HISTORY   Past Medical History:  Diagnosis Date  . Anemia   . CAD, multiple vessel 2004   Found during preop cath for [facility] AVR procedure; Myoview 5/'13: No ischemia or infarct, EF 53% --> cath December 2018: Proximal LAD 65% followed by mid LAD 65% then 100% occlusion.  Patent LIMA-LAD.  SVG-D2 occluded.  RPDA occluded. Seq SVG-r PDA-PL patent to PDA, but the PDA is occluded as is the proximal native PDA  . COPD (chronic obstructive pulmonary disease) ([place])   . Diverticulosis   . Dyslipidemia, goal LDL below 70    Monitored by PCP. On statin (last labs scan from September 2015: TC 149, TG 127, HDL 55, LDL 69)  . ED (erectile dysfunction)   . GERD (gastroesophageal reflux disease)   . History of SBO (small bowel obstruction) March 2015   Abdominal surgery with LOA  . Hx of irritable bowel syndrome   . Hypertension, essential   . Lesion of right native kidney  08/20/2013   Abdominal US : 2.4 cm x 1.7 complex lesion in the upper pole the right kidney in March of 2012 concerning for RCC although enlarging hemorrhagic cyst could have this appearance as well.,   . Monoclonal gammopathy of undetermined significance    . Paroxysmal-persistent atrial fibrillation ([place]); CHA2DS2-VASc Score 4 03/23/2017  . Psoriasis   . S/P AVR (aortic valve replacement) and aortoplasty 2004   Dr. Cyndia BentDeneen Harts procedure - St. Jude aVR (25 mm prosthesis) with aortic root conduit;; Echo 10/'14: EF 55-60%, Mod Conc LVH, Gr 1 DD, Well Seated AoV Mech Prosthesis with normal P gradients (no stenosis), Mod-Severe LA dilation --> follow-up echo November 2016: Well functioning mechanical valve. Paradoxical septal motion. EF 50-55%. Severe LA dilation. Moderate RA dilation.   . S/P CABG x 4 2004   LIMA-LAD, SVG-D1, SVG- PDA-PLA (along with AVR)     PAST SURGICAL HISTORY   Past Surgical History:  Procedure Laterality Date  . ABDOMINAL AORTIC ANEURYSM REPAIR    . AORTIC VALVE REPLACEMENT    . ASD REPAIR  12/26/2002   Aortic valve replacement and replacement of aortic root aneurysm using a 25 mm St. JUDE mechanical valve cinduit with reimplantation of the coronary arteeries  . CARDIAC CATHETERIZATION  Aug  13,2004   diffuse  coronary disease,prior to his BENTALL PROCEDURE  . CORONARY ARTERY BYPASS GRAFT  12/26/2002   LIMA to LAD,SVG to diagonal, SVG to PDA and PLA.   Marland Kitchen CORONARY/GRAFT ANGIOGRAPHY N/A 04/21/2017   Procedure: CORONARY/GRAFT ANGIOGRAPHY;  Surgeon: Leonie Man, MD;  Location: [facility] INVASIVE CV LAB: p & mLAD 65% then m100%. Patent LIMA-LAD. 100% SVG-D2 (D1 & D2 fill via native flow. RCA-RPAV patent, 100% ostrPDA, but SVG-rPDA-RPL patent to PDA then occluded.  Marland Kitchen EXERCISE TOLERANCE TEST  09/12/2017    5 METS Baseline ST depression.  No new changes with exercise.  Heart rate went to 141 bpm.  Poor exercise tolerance reaching target heart rate at 2:26-minute,.  No chronotropic Incompetence  . LAPAROSCOPIC LYSIS OF ADHESIONS  March 2015   performed in [place] for SBO   . NM MYOVIEW LTD  10/04/2011   showed no ischemia;EF52%  . PARTIAL COLECTOMY  1994  . TRANSTHORACIC ECHOCARDIOGRAM  02/2017   [facility])  technically difficult study.  Mild LVH.  Mildly reduced EF of 45-50%.  1+ MR and TR.  Severe LA dilation.  Moderate RV dilation.  Unable to assess diastolic function.  RVP 42 mmHg suggesting mild-moderate pulmonary hypertension.  Mechanical Saint Jude Aortic Valve Prosthesis not well visualized, but gradient 5 mmHg.  Marland Kitchen TRANSTHORACIC ECHOCARDIOGRAM  11/2018    Stable EF 45-50%-moderate HK of apical-anteroseptal wall..  Moderate asymmetric LVH with GR 1 DD.  Normal RV.  RA mildly dilated.  Moderate MAC.  Marland Kitchen VENTRAL HERNIA REPAIR     repair with mesh by Dr Collene Mares at [place] - 04/11/2017 - med Rx; The findings correlate directly with the stress test findings of inferior infarct and anterior infarct.  In the absence of ongoing anginal symptoms,  would not attempt to try PCI of the chronically occluded PDA. The minimal segment of LAD compromising a septal perforator has not changed since bypass. Likely not a major issue.    MEDICATIONS/ALLERGIES   Current Meds  Medication Sig  . amLODipine (NORVASC) 10 MG tablet TAKE 1 TABLET EVERY DAY. DUE FOR FOLLOW UP APPOINTMENT IN APRIL   . ampicillin (PRINCIPEN) 500 MG capsule Take 500 mg by mouth daily.   . busPIRone (BUSPAR) 15 MG tablet Take 15 mg by mouth 4 (four) times daily.   . DENTA 5000 PLUS 1.1 % CREA dental cream Place 1 Squirt onto teeth daily.  Marland Kitchen enoxaparin (LOVENOX) 100 MG/ML injection Inject 1 mL (100 mg total) into the skin every 12 (twelve) hours.  . Esomeprazole Magnesium (NEXIUM PO) Take by mouth.  . ferrous sulfate 325 (65 FE) MG tablet Take 1 tablet by mouth 2 (two) times daily.  . furosemide (LASIX) 20 MG tablet TAKE 1 TO 2 TABLETS EVERY DAY IF NEEDED  . LORazepam (ATIVAN) 0.5 MG tablet Take 0.5 mg by mouth daily.  . metoprolol succinate (TOPROL-XL) 50 MG 24 hr tablet TAKE 1 TABLET EVERY DAY, WITH OR IMMEDIATELY FOLLOWING A MEAL.  . nitroGLYCERIN (NITROSTAT) 0.4 MG SL tablet 1 TAB(S) EVERY 5 MINUTES X 3 AS  NEEDED FOR CHEST PAIN AND CALL 911 SUBLINGUALLY  . omeprazole (PRILOSEC OTC) 20 MG tablet Take 20 mg by mouth daily.  . potassium chloride SA (K-DUR) 20 MEQ tablet TAKE 1 TABLET TWICE DAILY  . ranolazine (RANEXA) 500 MG 12 hr tablet TAKE 1 TABLET TWICE DAILY  . rosuvastatin (CRESTOR) 20 MG tablet Take 1/2 tablet every other day (3 days/week) for 1 week, then increase to 4 days/week and incrementally up to 7 days a week over the course of next month.  Marland Kitchen  rosuvastatin (CRESTOR) 20 MG tablet Second month take 1/2 tablet alternating with 1 tablet every other day (3 days a week and increasing up every week by 1 day a week of full tablet versus 1/2 tablet until taking full tablet every day week)  . simvastatin (ZOCOR) 20 MG tablet TAKE 1 TABLET AT BEDTIME. DUE FOR FOLLOW UP APPOINTMENT IN APRIL   . tamsulosin (FLOMAX) 0.4 MG CAPS capsule Take 0.4 mg by mouth 2 (two) times daily.   . Tiotropium Bromide-Olodaterol (STIOLTO RESPIMAT) 2.5-2.5 MCG/ACT AERS Inhale into the lungs daily.  . valsartan (DIOVAN) 320 MG tablet Take 1 tablet (320 mg total) by mouth daily.  Marland Kitchen warfarin (COUMADIN) 5 MG tablet TAKE 1 AND 1/2 TABLETS TO 2 TABLETS DAILY AS DIRECTED BY COUMADIN CLINIC    Allergies  Allergen Reactions  . Doxycycline Rash    SOCIAL HISTORY/FAMILY HISTORY   Social History   Tobacco Use  . Smoking status: Former Smoker    Quit date: 06/16/1997    Years since quitting: 21.7  . Smokeless tobacco: Never Used  Substance Use Topics  . Alcohol use: Yes  . Drug use: No   Social History   Social History Narrative   Married gentleman with no children. Quit smoking in 1998.   Education: 2 years of college. He does drink caffeine.  He drinks social beer.   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 Claiborne Billings in [place]; GI: Dr. Janus Molder in [place]       **November 2020: He notes increased stress family to care for his wife who is showing signs of worsening dementia.  Hard to adjust to this and is somewhat depressing.    -He is try to work on cutting back on his intake of salt specific    Family History family history includes COPD in his sister; Colon cancer in his mother; Congestive Heart Failure in his mother; Lung cancer in his father.   OBJCTIVE -PE, EKG, labs   Wt Readings from Last 3 Encounters:  03/21/19 213 lb (96.6 kg)  01/18/19 211 lb (95.7 kg)  12/03/18 211 lb 6 oz (95.9 kg)    Physical Exam: BP 135/74   Pulse 73   Ht _0  (1.753 m)   Wt 213 lb (96.6 kg)   SpO2 96%   BMI 31.45 kg/m  Physical Exam  Constitutional: He is oriented to person, place, and time. He appears well-developed and well-nourished. No distress.  Well-groomed.  Healthy-appearing.  HENT:  Head: Normocephalic and atraumatic.  Neck: Normal range of motion. Neck supple. No hepatojugular reflux and no JVD present. Carotid bruit is not present.  Cardiovascular: Normal rate, regular rhythm, S1 normal, S2 normal and intact distal pulses.  Occasional extrasystoles are present. PMI is not displaced. Exam reveals distant heart sounds. Exam reveals no gallop and no friction rub.  Murmur (2/6 C-D SEM @ RUSB--> neck) heard. Pulmonary/Chest: Effort normal and breath sounds normal. No respiratory distress. He has no wheezes. He has no rales. He exhibits tenderness (Some left-sided flank, mid axillary discomfort also right-sided flank).  Abdominal: Soft. Bowel sounds are normal. He exhibits no distension. There is no abdominal tenderness. There is no rebound.  No HSM  Musculoskeletal: Normal range of motion.        General: Edema (Trivial bilateral LE-ankle) present.  Neurological: He is alert and oriented to person, place, and time.  Skin:  Mild diffuse ecchymosis on arms  Psychiatric: His behavior is normal. Judgment and thought content normal.  Better  less depressed mood.  Normal affect  Vitals reviewed.   Adult ECG Report  Recent Labs: Due for labs to be checked No results found for: CHOL, HDL, LDLCALC, LDLDIRECT, TRIG, CHOLHDL Lab Results  Component Value Date   CREATININE 0.95 04/21/2017   BUN 10 04/21/2017   NA 134 (L) 04/21/2017   K 4.4 04/21/2017   CL 101 04/21/2017   CO2 27 04/21/2017    ASSESSMENT/PLAN    Problem List Items Addressed This Visit    S/P AVR (aortic valve replacement) and aortoplasty (Chronic)    Probably follow-up valve function in a couple years with follow-up echocardiogram. Continue SBE prophylaxis for procedures such as GI procedures and detail dental procedures.      Coronary artery disease involving coronary bypass graft of native heart with angina pectoris ([place]) - Primary (Chronic)    Very significant extensive native CAD with a stress test suggesting ischemia in the proximal LAD distribution that would require significant amount of potential atherectomy and stenting to improve flow to minimal branches. He is actually done pretty well and although the ER visit chest pain was truly angina.  Fairly well-controlled angina on Ranexa, amlodipine and Toprol.  We have discontinued Imdur for side effects.  Also long ARB and have converted statin to rosuvastatin.  No antiplatelet agent because of warfarin.      Paroxysmal-persistent atrial fibrillation (HCC); CHA2DS2-VASc Score 4 (Chronic)    Nightly, no further breakthrough episodes.  He gets short palpitations relieved with vagal maneuvers. Continues on current dose of Toprol (currently listed as 50 mg, but I think he supposed be taking 25 mg)  Plan for breakthrough spells will be to initiate amiodarone load with plans for either chemical cardioversion versus facilitated cardioversion. Continue warfarin.  No bleeding issues. Improved bruising since stopping aspirin.  Okay to hold warfarin for procedures, would not need to bridge.  Only caveat is  that this would be not within 4 weeks of cardioversion      Chronic diastolic heart failure (HCC) (Chronic)    Seems relatively euvolemic on current dose of Lasix.  Not always taking it every day, but most days.  On beta-blocker and ARB along with amlodipine for additional afterload reduction.      Dyslipidemia, goal LDL below 70 (Chronic)    LDL is 83.  Not at goal on simvastatin.  We switched him from simvastatin to rosuvastatin 20 mg--gradually titrating up from 10 mg 2 days a week up to 10 mg potentially daily before going to 20 mg daily.  He is tolerating it so far.  Definitely notes improvement in myalgias.  Plan lipid panel follow-up in 3 to 4 months.      Relevant Orders   Lipid panel   Hypertension, essential (Chronic)    Blood pressure looks good, well controlled.  We are allowing for mild potential permissive hypertension.  He is on stable dose of amlodipine and Toprol.  Also on max dose of valsartan.   He is on standing dose of Lasix taking 1-2 a day depending on his level of edema.  Stable      Obesity (BMI 30-39.9) (Chronic)    Borderline obese.  Continue to recommend dietary modification and increased exercise.  He is definitely doing better and hoping that his joint pains will allow him  to continue once he is able to go back to the [facility].      Long term current use of anticoagulant therapy (Chronic)    Warfarin dose titrated today by clinical pharmacist      Chest pain, atypical (Chronic)    2 hours of chest discomfort occurring at rest with no troponin elevation leading to an ER visit.  He did not indicate that this sounded like his angina and is probably more musculoskeletal in nature.  No further pain now.      Chronotropic incompetence    Seems to be doing relatively well current dose of beta-blocker.  Monitor for signs of breakthrough episodes of A. Fib.  Needs to be on some dose of beta-blocker.          COVID-19 Education: The signs and symptoms of  COVID-19 were discussed with the patient and how to seek care for testing (follow up with PCP or arrange E-visit).   The importance of social distancing was discussed today.  I spent a total of 22 minutes with the patient and chart review. >  50% of the time was spent in direct patient consultation.  Additional time spent with chart review (studies, outside notes, etc): 8 Total Time: 30 min   Current medicines are reviewed at length with the patient today.  (+/- concerns) still titrating up rosuvastatin.   Patient Instructions / Medication Changes & Studies & Tests Ordered   Patient Instructions  Medication Instructions:  NO CHANGE *If you need a refill on your cardiac medications before your next appointment, please call your pharmacy*  Lab Work: Your physician recommends that you return for lab work in: MARCH 2021  If you have labs (blood work) drawn today and your tests are completely normal, you will receive your results only by: Marland Kitchen MyChart Message (if you have MyChart) OR . A paper copy in the mail If you have any lab test that is abnormal or we need to change your treatment, we will call you to review the results.  Follow-Up: At [facility], you and your health needs are our priority.  As part of our continuing mission to provide you with exceptional heart care, we have created designated Provider Care Teams.  These Care Teams include your primary Cardiologist (physician) and Advanced Practice Providers (APPs -  Physician Assistants and Nurse Practitioners) who all work together to provide you with the care you need, when you need it.  Your next appointment:   6 months  The format for your next appointment:   In Person  Provider:   Glenetta Hew, MD     Studies Ordered:   Orders Placed This Encounter  Procedures  . Lipid panel     Glenetta Hew, M.D., M.S. Interventional Cardiologist   Pager # [phone] Phone # [phone] [place], [place] [address]   Thank you for choosing Heartcare at [facility]!!

## 2019-03-21 NOTE — Patient Instructions (Signed)
Medication Instructions:  NO CHANGE *If you need a refill on your cardiac medications before your next appointment, please call your pharmacy*  Lab Work: Your physician recommends that you return for lab work in: MARCH 2021  If you have labs (blood work) drawn today and your tests are completely normal, you will receive your results only by: Marland Kitchen MyChart Message (if you have MyChart) OR . A paper copy in the mail If you have any lab test that is abnormal or we need to change your treatment, we will call you to review the results.  Follow-Up: At [facility] you and your health needs are our priority.  As part of our continuing mission to provide you with exceptional heart care, we have created designated Provider Care Teams.  These Care Teams include your primary Cardiologist (physician) and Advanced Practice Providers (APPs -  Physician Assistants and Nurse Practitioners) who all work together to provide you with the care you need, when you need it.  Your next appointment:   6 months  The format for your next appointment:   In Person  Provider:   Glenetta Hew, MD

## 2019-03-22 ENCOUNTER — Encounter: Payer: Self-pay | Admitting: Cardiology

## 2019-03-22 NOTE — Assessment & Plan Note (Signed)
Probably follow-up valve function in a couple years with follow-up echocardiogram. Continue SBE prophylaxis for procedures such as GI procedures and detail dental procedures.

## 2019-03-22 NOTE — Assessment & Plan Note (Signed)
Seems to be doing relatively well current dose of beta-blocker.  Monitor for signs of breakthrough episodes of A. Fib.  Needs to be on some dose of beta-blocker.

## 2019-03-22 NOTE — Assessment & Plan Note (Signed)
Very significant extensive native CAD with a stress test suggesting ischemia in the proximal LAD distribution that would require significant amount of potential atherectomy and stenting to improve flow to minimal branches. He is actually done pretty well and although the ER visit chest pain was truly angina.  Fairly well-controlled angina on Ranexa, amlodipine and Toprol.  We have discontinued Imdur for side effects.  Also long ARB and have converted statin to rosuvastatin.  No antiplatelet agent because of warfarin.

## 2019-03-22 NOTE — Assessment & Plan Note (Signed)
2 hours of chest discomfort occurring at rest with no troponin elevation leading to an ER visit.  He did not indicate that this sounded like his angina and is probably more musculoskeletal in nature.  No further pain now.

## 2019-03-22 NOTE — Assessment & Plan Note (Signed)
LDL is 83.  Not at goal on simvastatin.  We switched him from simvastatin to rosuvastatin 20 mg--gradually titrating up from 10 mg 2 days a week up to 10 mg potentially daily before going to 20 mg daily.  He is tolerating it so far.  Definitely notes improvement in myalgias.  Plan lipid panel follow-up in 3 to 4 months.

## 2019-03-22 NOTE — Assessment & Plan Note (Signed)
Nightly, no further breakthrough episodes.  He gets short palpitations relieved with vagal maneuvers. Continues on current dose of Toprol (currently listed as 50 mg, but I think he supposed be taking 25 mg)  Plan for breakthrough spells will be to initiate amiodarone load with plans for either chemical cardioversion versus facilitated cardioversion. Continue warfarin.  No bleeding issues. Improved bruising since stopping aspirin.  Okay to hold warfarin for procedures, would not need to bridge.  Only caveat is that this would be not within 4 weeks of cardioversion

## 2019-03-22 NOTE — Assessment & Plan Note (Signed)
Seems relatively euvolemic on current dose of Lasix.  Not always taking it every day, but most days.  On beta-blocker and ARB along with amlodipine for additional afterload reduction.

## 2019-03-22 NOTE — Assessment & Plan Note (Addendum)
Warfarin dose titrated today by clinical pharmacist

## 2019-03-22 NOTE — Assessment & Plan Note (Signed)
Blood pressure looks good, well controlled.  We are allowing for mild potential permissive hypertension.  He is on stable dose of amlodipine and Toprol.  Also on max dose of valsartan.   He is on standing dose of Lasix taking 1-2 a day depending on his level of edema.  Stable

## 2019-03-22 NOTE — Assessment & Plan Note (Addendum)
Borderline obese.  Continue to recommend dietary modification and increased exercise.  He is definitely doing better and hoping that his joint pains will allow him to continue once he is able to go back to the [facility].

## 2019-04-01 ENCOUNTER — Other Ambulatory Visit: Payer: Self-pay | Admitting: Cardiology

## 2019-04-01 LAB — PROTIME-INR: INR: 2 — AB (ref <=1.1)

## 2019-04-02 ENCOUNTER — Ambulatory Visit (INDEPENDENT_AMBULATORY_CARE_PROVIDER_SITE_OTHER): Payer: Medicare HMO | Admitting: Pharmacist

## 2019-04-02 DIAGNOSIS — I480 Paroxysmal atrial fibrillation: Secondary | ICD-10-CM

## 2019-04-02 DIAGNOSIS — Z7901 Long term (current) use of anticoagulants: Secondary | ICD-10-CM

## 2019-04-02 DIAGNOSIS — Z952 Presence of prosthetic heart valve: Secondary | ICD-10-CM

## 2019-04-02 LAB — PROTIME-INR
INR: 2 — ABNORMAL HIGH (ref 0.9–1.2)
Prothrombin Time: 21 s — ABNORMAL HIGH (ref 9.1–12.0)

## 2019-04-10 ENCOUNTER — Encounter: Payer: Self-pay | Admitting: Pharmacist Clinician (PhC)/ Clinical Pharmacy Specialist

## 2019-04-10 DIAGNOSIS — Z952 Presence of prosthetic heart valve: Secondary | ICD-10-CM

## 2019-04-10 DIAGNOSIS — Z7901 Long term (current) use of anticoagulants: Secondary | ICD-10-CM

## 2019-04-10 NOTE — Progress Notes (Signed)
This encounter was created in error - please disregard.

## 2019-04-24 NOTE — Telephone Encounter (Signed)
MyChart encounter:  That sounds great.  We can good: The prescription for full supply of rosuvastatin 20 mg.  Glenetta Hew, MD  ===View-only below this line===   ----- Message -----    From: Erik Meyer    Sent: 04/24/2019 12:32 PM EST      To: Glenetta Hew, MD Subject: Non-Urgent Medical Question  Dr Ellyn Hack We have been doing an experiment with Rosuvastatin 20 mg for a while now and I had had no bad reactions. I would like to go ahead and get a full time prescription for it from my mail order pharmacy, [facility]. Please advise Erik Meyer

## 2019-04-29 MED ORDER — ROSUVASTATIN CALCIUM 20 MG PO TABS: 20.0000 mg | ORAL_TABLET | Freq: Every day | ORAL | 3 refills | Status: DC

## 2019-04-29 NOTE — Telephone Encounter (Signed)
E -sent prescription to mail order for rosuvastatin 20 mg  #90 x 3 refills.  Medication list  Changed to reflect changes.

## 2019-05-01 ENCOUNTER — Other Ambulatory Visit: Payer: Self-pay | Admitting: Cardiology

## 2019-05-08 LAB — POCT INR: INR: 3 (ref 2.0–3.0)

## 2019-05-09 ENCOUNTER — Ambulatory Visit (INDEPENDENT_AMBULATORY_CARE_PROVIDER_SITE_OTHER): Payer: Medicare HMO | Admitting: Cardiovascular Disease

## 2019-05-09 DIAGNOSIS — Z7901 Long term (current) use of anticoagulants: Secondary | ICD-10-CM | POA: Diagnosis not present

## 2019-05-09 DIAGNOSIS — Z952 Presence of prosthetic heart valve: Secondary | ICD-10-CM | POA: Diagnosis not present

## 2019-05-09 LAB — PROTIME-INR
INR: 3 — ABNORMAL HIGH (ref 0.9–1.2)
Prothrombin Time: 30.4 s — ABNORMAL HIGH (ref 9.1–12.0)

## 2019-05-25 ENCOUNTER — Other Ambulatory Visit: Payer: Self-pay | Admitting: Cardiology

## 2019-05-28 NOTE — Telephone Encounter (Signed)
Rx(s) sent to pharmacy electronically.  

## 2019-06-06 ENCOUNTER — Telehealth: Payer: Self-pay | Admitting: Cardiology

## 2019-06-06 NOTE — Telephone Encounter (Signed)
Will route to MD's nurse to advise if she has seen this information.

## 2019-06-06 NOTE — Telephone Encounter (Signed)
Patient is calling to follow up in regards to an application sent in to Dr. Ellyn Hack to renew his handicap placard. Please call.

## 2019-06-10 ENCOUNTER — Other Ambulatory Visit: Payer: Self-pay

## 2019-06-10 ENCOUNTER — Other Ambulatory Visit: Payer: Self-pay | Admitting: Cardiology

## 2019-06-10 DIAGNOSIS — I480 Paroxysmal atrial fibrillation: Secondary | ICD-10-CM

## 2019-06-10 LAB — POCT INR: INR: 1.8 — AB (ref 2.0–3.0)

## 2019-06-10 NOTE — Telephone Encounter (Signed)
My chart message about placard for handicap parking.  Due to clerical error, stable was not yet available.  We will look into this soon as possible.Glenetta Hew, MD    I have not seen the paperwork to sign - will ask Ivin Booty to look into where it could be.   I am happy to fill these forms out - & sign them .  Glenetta Hew, MD   ===View-only below this line===   ----- Message -----      From:Erik Meyer      Sent:06/09/2019  5:09 PM EST        [id number] Ellyn Hack, MD   Subject:Non-Urgent Medical Question  Dr Ellyn Hack I am still trying to contact someone in your office. I sent  a letter on January 7 Sent a mychart message on January 23 Called your office on January 28 Got another doctor to send a message on January 29 , Dr Harrell Gave Still nothing I'm at my wits end.  Patient and doctor communication is part of patient care, what kind of care am I getting ????? Erik Meyer

## 2019-06-10 NOTE — Telephone Encounter (Signed)
DMV form filled out and given to Dr Allison Quarry RN

## 2019-06-10 NOTE — Telephone Encounter (Signed)
Called patient informed patient  handicapped placard form has been signed and placed in the mail  Patient verbalized understading

## 2019-06-10 NOTE — Telephone Encounter (Signed)
Patient calling back to follow up about his handicap placard.

## 2019-06-11 LAB — PROTIME-INR
INR: 1.8 — ABNORMAL HIGH (ref 0.9–1.2)
Prothrombin Time: 18.8 s — ABNORMAL HIGH (ref 9.1–12.0)

## 2019-06-12 ENCOUNTER — Ambulatory Visit (INDEPENDENT_AMBULATORY_CARE_PROVIDER_SITE_OTHER): Payer: Medicare HMO | Admitting: Cardiology

## 2019-06-12 DIAGNOSIS — Z952 Presence of prosthetic heart valve: Secondary | ICD-10-CM

## 2019-06-12 DIAGNOSIS — Z7901 Long term (current) use of anticoagulants: Secondary | ICD-10-CM | POA: Diagnosis not present

## 2019-06-28 ENCOUNTER — Other Ambulatory Visit: Payer: Self-pay | Admitting: Cardiology

## 2019-06-29 LAB — PROTIME-INR
INR: 2.8 — ABNORMAL HIGH (ref 0.9–1.2)
Prothrombin Time: 28.9 s — ABNORMAL HIGH (ref 9.1–12.0)

## 2019-07-01 ENCOUNTER — Telehealth: Payer: Self-pay | Admitting: Pharmacist Clinician (PhC)/ Clinical Pharmacy Specialist

## 2019-07-01 NOTE — Telephone Encounter (Signed)
Lab order re-faxed to lab [phone]

## 2019-08-01 ENCOUNTER — Other Ambulatory Visit: Payer: Self-pay | Admitting: Cardiology

## 2019-08-01 LAB — PROTIME-INR: INR: 2.6 — AB (ref <=1.1)

## 2019-08-02 LAB — PROTIME-INR
INR: 2.6 — ABNORMAL HIGH (ref 0.9–1.2)
Prothrombin Time: 26.6 s — ABNORMAL HIGH (ref 9.1–12.0)

## 2019-08-06 ENCOUNTER — Ambulatory Visit (INDEPENDENT_AMBULATORY_CARE_PROVIDER_SITE_OTHER): Payer: Medicare HMO | Admitting: Pharmacist Clinician (PhC)/ Clinical Pharmacy Specialist

## 2019-08-06 DIAGNOSIS — Z7901 Long term (current) use of anticoagulants: Secondary | ICD-10-CM

## 2019-08-06 DIAGNOSIS — I480 Paroxysmal atrial fibrillation: Secondary | ICD-10-CM

## 2019-08-06 DIAGNOSIS — Z952 Presence of prosthetic heart valve: Secondary | ICD-10-CM

## 2019-08-08 ENCOUNTER — Other Ambulatory Visit: Payer: Self-pay | Admitting: Cardiology

## 2019-08-28 ENCOUNTER — Other Ambulatory Visit: Payer: Self-pay | Admitting: Cardiology

## 2019-08-28 LAB — PROTIME-INR: INR: 3.6 — AB (ref <=1.1)

## 2019-08-29 LAB — PROTIME-INR
INR: 3.6 — ABNORMAL HIGH (ref 0.9–1.2)
Prothrombin Time: 36.1 s — ABNORMAL HIGH (ref 9.1–12.0)

## 2019-08-30 ENCOUNTER — Ambulatory Visit (INDEPENDENT_AMBULATORY_CARE_PROVIDER_SITE_OTHER): Payer: Medicare HMO | Admitting: Pharmacist Clinician (PhC)/ Clinical Pharmacy Specialist

## 2019-08-30 DIAGNOSIS — Z7901 Long term (current) use of anticoagulants: Secondary | ICD-10-CM | POA: Diagnosis not present

## 2019-08-30 DIAGNOSIS — Z952 Presence of prosthetic heart valve: Secondary | ICD-10-CM

## 2019-09-20 LAB — LIPID PANEL
Chol/HDL Ratio: 2 ratio (ref 0.0–5.0)
Cholesterol, Total: 152 mg/dL (ref 100–199)
HDL: 76 mg/dL (ref >39)
LDL Chol Calc (NIH): 61 mg/dL (ref 0–99)
Triglycerides: 80 mg/dL (ref 0–149)
VLDL Cholesterol Cal: 15 mg/dL (ref 5–40)

## 2019-09-24 ENCOUNTER — Other Ambulatory Visit: Payer: Self-pay | Admitting: Cardiology

## 2019-09-25 LAB — PROTIME-INR
INR: 2.7 — ABNORMAL HIGH (ref 0.9–1.2)
Prothrombin Time: 28.2 s — ABNORMAL HIGH (ref 9.1–12.0)

## 2019-09-26 ENCOUNTER — Other Ambulatory Visit: Payer: Self-pay

## 2019-09-26 ENCOUNTER — Encounter: Payer: Self-pay | Admitting: Cardiology

## 2019-09-26 ENCOUNTER — Ambulatory Visit (INDEPENDENT_AMBULATORY_CARE_PROVIDER_SITE_OTHER): Payer: Medicare HMO | Admitting: Pharmacist

## 2019-09-26 ENCOUNTER — Other Ambulatory Visit (INDEPENDENT_AMBULATORY_CARE_PROVIDER_SITE_OTHER): Payer: Medicare HMO

## 2019-09-26 ENCOUNTER — Ambulatory Visit: Payer: Medicare HMO | Admitting: Cardiology

## 2019-09-26 VITALS — BP 142/58 | HR 64 | Ht 69.0 in | Wt 210.0 lb

## 2019-09-26 DIAGNOSIS — Z951 Presence of aortocoronary bypass graft: Secondary | ICD-10-CM

## 2019-09-26 DIAGNOSIS — I480 Paroxysmal atrial fibrillation: Secondary | ICD-10-CM

## 2019-09-26 DIAGNOSIS — Z952 Presence of prosthetic heart valve: Secondary | ICD-10-CM

## 2019-09-26 DIAGNOSIS — I25709 Atherosclerosis of coronary artery bypass graft(s), unspecified, with unspecified angina pectoris: Secondary | ICD-10-CM

## 2019-09-26 DIAGNOSIS — I10 Essential (primary) hypertension: Secondary | ICD-10-CM

## 2019-09-26 DIAGNOSIS — Z7901 Long term (current) use of anticoagulants: Secondary | ICD-10-CM | POA: Diagnosis not present

## 2019-09-26 DIAGNOSIS — I5032 Chronic diastolic (congestive) heart failure: Secondary | ICD-10-CM

## 2019-09-26 DIAGNOSIS — E785 Hyperlipidemia, unspecified: Secondary | ICD-10-CM

## 2019-09-26 LAB — POCT INR: INR: 3.3 — AB (ref 2.0–3.0)

## 2019-09-26 MED ORDER — METOPROLOL SUCCINATE ER 25 MG PO TB24: 25.0000 mg | ORAL_TABLET | Freq: Every day | ORAL | 3 refills | Status: DC

## 2019-09-26 MED ORDER — ROSUVASTATIN CALCIUM 20 MG PO TABS: 20.0000 mg | ORAL_TABLET | Freq: Every day | ORAL | 3 refills | Status: DC

## 2019-09-26 NOTE — Patient Instructions (Signed)
Description   Continue with 10 mg daily except 7.5 mg each Sunday, Tuesday and Thursday.  Repeat INR in 6 weeks.

## 2019-09-26 NOTE — Progress Notes (Signed)
Primary Care Provider: Ivan Anchors, MD Cardiologist: Glenetta Hew, MD Electrophysiologist: None  Clinic Note: Chief Complaint  Patient presents with  . Follow-up    6 months  . Coronary Artery Disease    No angina    HPI:    Erik Meyer is a 79 y.o. 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   MV-CAD-> s/p CABG/AVR/aortoplasty in 26629complicated by PAF, cardiomyopathy and chronic fatigue   September 2020: GXT with no evidence of chronotropic incompetence.  Weaned off of amiodarone, maintained on Toprol 25-> less fatigue with reduced dose  Plan was to allow mild permissive hypertension.  He is amiodarone as needed breakthrough A. fib  Converted to rosuvastatin   Echo showed EF stable at 45 to 50%.  Erik KIANGwas last seen on March 21, 2019-was doing fairly well ever since the October 6 chest pain visit.  Was looking forward to getting back into the [facility]  Rare fleeting palpitation episodes.  Tolerating rosuvastatin with less notable myalgias..  Recent Hospitalizations:   ER visit in February for anxiety  Reviewed  CV studies:    The following studies were reviewed today: (if available, images/films reviewed: From Epic Chart or Care Everywhere) . No new studies:   Interval History:   Erik SOLTYSreturns here today.  He is pretty happy overall that he really is not noticing any complaints.  He does about 3 days a week at the gym.  Happily back exercising.  He has not felt any sensations to suggest recurrence of A. fib.  Has not had any angina or heart failure symptoms.  Mostly limited by back pain at this particular point.  CV Review of Symptoms (Summary) Cardiovascular ROS: no chest pain or dyspnea on exertion negative for - edema, irregular heartbeat, orthopnea, palpitations, paroxysmal nocturnal dyspnea, rapid  heart rate, shortness of breath or Syncope/near syncope, TIA/amaurosis fugax, claudication  The patient does not have symptoms concerning for COVID-19 infection (fever, chills, cough, or new shortness of breath).  The patient is practicing social distancing & Masking.   COVID-19 vaccine injections and January and February   REVIEWED OF SYSTEMS   Review of Systems  Constitutional: Negative for malaise/fatigue (Energy level seems to be better.) and weight loss.  HENT: Negative for congestion and nosebleeds.   Respiratory: Negative for shortness of breath and wheezing.   Gastrointestinal: Negative for abdominal pain, blood in stool, constipation and melena.  Genitourinary: Negative for hematuria.  Musculoskeletal: Positive for back pain and joint pain (Hips). Negative for myalgias.  Neurological: Negative for dizziness and focal weakness.  Endo/Heme/Allergies: Does not bruise/bleed easily (Notably improved since not on aspirin.).  Psychiatric/Behavioral: Negative for memory loss. The patient is not nervous/anxious.        Biggest issue is dealing with his wife's progression of the dementia.  Lots of extra work to do around the house.  They do now have some help that allows him some freedom to do things.   I have reviewed and (if needed) personally updated the patient's problem list, medications, allergies, past medical and surgical history, social and family history.   PAST MEDICAL HISTORY   Past Medical History:  Diagnosis Date  . Anemia   . CAD, multiple vessel 2004   Found during [facility]op cath [facility]; Myoview 5/'13: No ischemia or infarct, EF 53% --> cath December 2018: Proximal LAD 65%  followed by mid LAD 65% then 100% occlusion.  Patent LIMA-LAD.  SVG-D2 occluded.  RPDA occluded. Seq SVG-r PDA-PL patent to PDA, but the PDA is occluded as is the proximal native PDA  . COPD (chronic obstructive pulmonary disease) ([place])   . Diverticulosis   . Dyslipidemia, goal LDL  below 70    Monitored by PCP. On statin (last labs scan from September 2015: TC 149, TG 127, HDL 55, LDL 69)  . ED (erectile dysfunction)   . GERD (gastroesophageal reflux disease)   . History of SBO (small bowel obstruction) March 2015   Abdominal surgery with LOA  . Hx of irritable bowel syndrome   . Hypertension, essential   . Lesion of right native kidney  08/20/2013   Abdominal US : 2.4 cm x 1.7 complex lesion in the upper pole the right kidney in March of 2012 concerning for RCC although enlarging hemorrhagic cyst could have this appearance as well.,   . Monoclonal gammopathy of undetermined significance   . Paroxysmal-persistent atrial fibrillation ([place]); CHA2DS2-VASc Score 4 03/23/2017  . Psoriasis   . S/P AVR (aortic valve replacement) and aortoplasty 2004   Dr. Cyndia BentDeneen Harts procedure - St. Jude aVR (25 mm prosthesis) with aortic root conduit;; Echo 10/'14: EF 55-60%, Mod Conc LVH, Gr 1 DD, Well Seated AoV Mech Prosthesis with normal P gradients (no stenosis), Mod-Severe LA dilation --> follow-up echo November 2016: Well functioning mechanical valve. Paradoxical septal motion. EF 50-55%. Severe LA dilation. Moderate RA dilation.   . S/P CABG x 4 2004   LIMA-LAD, SVG-D1, SVG- PDA-PLA (along with AVR)    PAST SURGICAL HISTORY   Past Surgical History:  Procedure Laterality Date  . ABDOMINAL AORTIC ANEURYSM REPAIR    . AORTIC VALVE REPLACEMENT    . ASD REPAIR  12/26/2002   Aortic valve replacement and replacement of aortic root aneurysm using a 25 mm St. JUDE mechanical valve cinduit with reimplantation of the coronary arteeries  . CARDIAC CATHETERIZATION  Aug  13,2004   diffuse  coronary disease,prior to his BENTALL PROCEDURE  . CORONARY ARTERY BYPASS GRAFT  12/26/2002   LIMA to LAD,SVG to diagonal, SVG to PDA and PLA.   Marland Kitchen CORONARY/GRAFT ANGIOGRAPHY N/A 04/21/2017   Procedure: CORONARY/GRAFT ANGIOGRAPHY;  Surgeon: Leonie Man, MD;  Location: [facility] INVASIVE CV LAB: p & mLAD  65% then m100%. Patent LIMA-LAD. 100% SVG-D2 (D1 & D2 fill via native flow. RCA-RPAV patent, 100% ostrPDA, but SVG-rPDA-RPL patent to PDA then occluded.  Marland Kitchen EXERCISE TOLERANCE TEST  09/12/2017    5 METS Baseline ST depression.  No new changes with exercise.  Heart rate went to 141 bpm.  Poor exercise tolerance reaching target heart rate at 2:26-minute,.  No chronotropic Incompetence  . LAPAROSCOPIC LYSIS OF ADHESIONS  March 2015   performed in [place] for SBO   . NM MYOVIEW LTD  10/04/2011   showed no ischemia;EF52%  . PARTIAL COLECTOMY  1994  . TRANSTHORACIC ECHOCARDIOGRAM  02/2017   [facility]) technically difficult study.  Mild LVH.  Mildly reduced EF of 45-50%.  1+ MR and TR.  Severe LA dilation.  Moderate RV dilation.  Unable to assess diastolic function.  RVP 42 mmHg suggesting mild-moderate pulmonary hypertension.  Mechanical Saint Jude Aortic Valve Prosthesis not well visualized, but gradient 5 mmHg.  Marland Kitchen TRANSTHORACIC ECHOCARDIOGRAM  11/2018    Stable EF 45-50%-moderate HK of apical-anteroseptal wall..  Moderate asymmetric LVH with GR 1 DD.  Normal RV.  RA mildly dilated.  Moderate  MAC.  Marland Kitchen VENTRAL HERNIA REPAIR     repair with mesh by Dr Collene Mares at [facility] - 04/11/2017 - med Rx; The findings correlate directly with the stress test findings of inferior infarct and anterior infarct. In the absence of ongoing anginal symptoms, would not attempt to try PCI of the chronically occluded PDA. The minimal segment of LAD compromising a septal perforator has not changed since bypass. Likely not a major issue.    MEDICATIONS/ALLERGIES   Current Meds  Medication Sig  . amLODipine (NORVASC) 10 MG tablet TAKE 1 TABLET EVERY DAY. DUE FOR FOLLOW UP APPOINTMENT IN APRIL   . ampicillin (PRINCIPEN) 500 MG capsule Take 500 mg by mouth daily.   . DENTA 5000 PLUS 1.1 % CREA dental cream Place 1 Squirt onto teeth daily.  Marland Kitchen enoxaparin (LOVENOX) 100 MG/ML injection Inject 1  mL (100 mg total) into the skin every 12 (twelve) hours.  . Esomeprazole Magnesium (NEXIUM PO) Take by mouth.  . ferrous sulfate 325 (65 FE) MG tablet Take 1 tablet by mouth 2 (two) times daily.  . furosemide (LASIX) 20 MG tablet TAKE 1 TO 2 TABLETS EVERY DAY IF NEEDED  . LORazepam (ATIVAN) 0.5 MG tablet Take 0.5 mg by mouth daily.  . nitroGLYCERIN (NITROSTAT) 0.4 MG SL tablet 1 TAB(S) EVERY 5 MINUTES X 3 AS NEEDED FOR CHEST PAIN AND CALL 911 SUBLINGUALLY  . omeprazole (PRILOSEC OTC) 20 MG tablet Take 20 mg by mouth daily.  . potassium chloride SA (K-DUR) 20 MEQ tablet TAKE 1 TABLET TWICE DAILY  . ranolazine (RANEXA) 500 MG 12 hr tablet TAKE 1 TABLET TWICE DAILY  . rosuvastatin (CRESTOR) 20 MG tablet Take 1 tablet (20 mg total) by mouth daily.  . tamsulosin (FLOMAX) 0.4 MG CAPS capsule Take 0.4 mg by mouth 2 (two) times daily.   . Tiotropium Bromide-Olodaterol (STIOLTO RESPIMAT) 2.5-2.5 MCG/ACT AERS Inhale into the lungs daily.  . valsartan (DIOVAN) 320 MG tablet TAKE 1 TABLET BY MOUTH EVERY DAY  . warfarin (COUMADIN) 5 MG tablet TAKE 1 AND 1/2 TABLETS TO 2 TABLETS DAILY AS DIRECTED BY COUMADIN CLINIC  . [DISCONTINUED] metoprolol succinate (TOPROL-XL) 50 MG 24 hr tablet TAKE 1 TABLET EVERY DAY, WITH OR IMMEDIATELY FOLLOWING A MEAL.  . [DISCONTINUED] rosuvastatin (CRESTOR) 20 MG tablet Take 1 tablet (20 mg total) by mouth daily.    Allergies  Allergen Reactions  . Doxycycline Rash    SOCIAL HISTORY/FAMILY HISTORY   Reviewed in Epic:  Pertinent findings:   Looking into getting part-time help for caregiving with his wife suffering from dementia.  COVID-19 vaccine injections in January and February.  OBJCTIVE -PE, EKG, labs   Wt Readings from Last 3 Encounters:  09/26/19 210 lb (95.3 kg)  03/21/19 213 lb (96.6 kg)  01/18/19 211 lb (95.7 kg)    Physical Exam: BP (!) 142/58   Pulse 64   Ht _0  (1.753 m)   Wt 210 lb (95.3 kg)   BMI 31.01 kg/m  Physical Exam  Constitutional:  He is oriented to person, place, and time. He appears well-developed and well-nourished. No distress.  Well-groomed.  Healthy-appearing.  HENT:  Head: Normocephalic and atraumatic.  Neck: No hepatojugular reflux and no JVD present. Carotid bruit is not present.  Cardiovascular: Normal rate, regular rhythm, S1 normal and intact distal pulses.  Occasional extrasystoles are present. Exam reveals distant heart sounds. Exam reveals no gallop and no friction rub.  Murmur heard.  Medium-pitched harsh crescendo-decrescendo midsystolic murmur  is present with a grade of 2/6 at the upper right sternal border radiating to the neck. Mechanical S2  Pulmonary/Chest: Effort normal and breath sounds normal. No respiratory distress. He has no wheezes. He has no rales. He exhibits no tenderness.  Abdominal: Soft. Bowel sounds are normal. He exhibits no distension. There is no abdominal tenderness. There is no rebound.  No HSM.  Musculoskeletal:        General: No edema. Normal range of motion.     Cervical back: Normal range of motion and neck supple.  Neurological: He is alert and oriented to person, place, and time.  Psychiatric: He has a normal mood and affect. His behavior is normal. Judgment and thought content normal.  Overall better spirits  Vitals reviewed.  Adult ECG Report  Rate: 64 ;  Rhythm: normal sinus rhythm and 1 degree AVB.  Otherwise normal axis, intervals and durations.;   Narrative Interpretation: Stable EKG  Recent Labs:    Lab Results  Component Value Date   CHOL 152 09/19/2019   HDL 76 09/19/2019   LDLCALC 61 09/19/2019   TRIG 80 09/19/2019   CHOLHDL 2.0 09/19/2019   Lab Results  Component Value Date   CREATININE 0.95 04/21/2017   BUN 10 04/21/2017   NA 134 (L) 04/21/2017   K 4.4 04/21/2017   CL 101 04/21/2017   CO2 27 04/21/2017   No results found for: TSH  ASSESSMENT/PLAN    Problem List Items Addressed This Visit    S/P AVR (aortic valve replacement) and  aortoplasty (Chronic)    Has been stable by recent echocardiogram.  In the absence of any worsening symptoms or exam findings, would probably hold off on relook echo for a few years.  Continue SBE prophylaxis recommendations (has standing dose of ampicillin ordered.  On warfarin      Coronary artery disease involving coronary bypass graft of native heart with angina pectoris (HCC) - Primary (Chronic)    Pretty much, with exertion graft and negative coronary arteries, he has a diagonal branch and the PDA that are not revascularizable.  All the rest seem to be relatively stable.  The graft the diagonal is closed but there is significant upstream flow which.  He denies any active anginal symptoms on current meds.  Plan: Continue Toprol and amlodipine as well as ARB.  Also rosuvastatin and as needed nitroglycerin.  Not on aspirin or Plavix because of warfarin.  Has not had PCI.      Relevant Medications   rosuvastatin (CRESTOR) 20 MG tablet   metoprolol succinate (TOPROL XL) 25 MG 24 hr tablet   Other Relevant Orders   EKG 12-Lead (Completed)   S/P CABG x 4 (Chronic)   Paroxysmal-persistent atrial fibrillation (HCC); CHA2DS2-VASc Score 4 (Chronic)    Does not seem like he had any recent breakthrough episodes having switched off of amiodarone.  Tolerating lower dose of Toprol 25 mg tablets (he is currently trying to take one half of the 50 mg tablets).  Plan is to use as needed amiodarone for breakthrough spells. Otherwise continue low-dose beta-blocker.  On warfarin for anticoagulation based on mechanical valve.   With mechanical aortic valve, would be okay to hold warfarin for procedures without need for bridge (however this would mean that he could not undergo cardioversion during the time interval)      Relevant Medications   rosuvastatin (CRESTOR) 20 MG tablet   metoprolol succinate (TOPROL XL) 25 MG 24 hr tablet   Other Relevant Orders  EKG 12-Lead (Completed)   Chronic  diastolic heart failure (HCC) (Chronic)    Euvolemic on exam.  Only on low-dose sliding scale Lasix which he is not requiring.  He is on beta-blocker and ARB as well as amlodipine for afterload reduction.  Baseline class I-II NYHA symptoms.      Relevant Medications   rosuvastatin (CRESTOR) 20 MG tablet   metoprolol succinate (TOPROL XL) 25 MG 24 hr tablet   Dyslipidemia, goal LDL below 70 (Chronic)    Labs look much better on rosuvastatin at current dose.  For now we will continue current dose.  No active myalgias.      Relevant Medications   rosuvastatin (CRESTOR) 20 MG tablet   metoprolol succinate (TOPROL XL) 25 MG 24 hr tablet   Hypertension, essential (Chronic)    Blood pressure is a little high today which is unusual for him.  Currently on 12-1/2 of Toprol and 10 of amlodipine along with 320 mg Diovan.  He is on as needed Lasix, and I suspect that he may require additional non-Lasix diuretic for better blood pressure control.      Relevant Medications   rosuvastatin (CRESTOR) 20 MG tablet   metoprolol succinate (TOPROL XL) 25 MG 24 hr tablet   Long term current use of anticoagulant therapy (Chronic)   Relevant Orders   EKG 12-Lead (Completed)       COVID-19 Education: The signs and symptoms of COVID-19 were discussed with the patient and how to seek care for testing (follow up with PCP or arrange E-visit).   The importance of social distancing and COVID-19 vaccination was discussed today.  I spent a total of 33mnutes with the patient. >  50% of the time was spent in direct patient consultation.  Additional time spent with chart review  / charting (studies, outside notes, etc): 8 Total Time: 27 min   Current medicines are reviewed at length with the patient today.  (+/- concerns) none  Notice: This dictation was prepared with Dragon dictation along with smaller phrase technology. Any transcriptional errors that result from this process are unintentional and may not  be corrected upon review.  Patient Instructions / Medication Changes & Studies & Tests Ordered   Patient Instructions  Medication Instructions:  change to Toprol XL ( metoprolol succinate ) 25 mg  One tablet daily   *If you need a refill on your cardiac medications before your next appointment, please call your pharmacy*   Lab Work: Not needed  Testing/Procedures: Not needed   Follow-Up: [facility] and [facility] needs are our [facility].  As part of our continuing mission to provide you with exceptional heart care, we have created designated Provider Care Teams.  These Care Teams include your primary Cardiologist (physician) and Advanced Practice Providers (APPs -  Physician Assistants and Nurse Practitioners) who all work together to provide you with the care you need, when you need it.     Your next appointment:   12 month(s)  The format for your next appointment:   In Person  Provider:   DGlenetta Hew MD   Other Instructions     Studies Ordered:   Orders Placed This Encounter  Procedures  . EKG 12-Lead     DGlenetta Hew M.D., M.S. Interventional Cardiologist   Pager # [phone] # [phone] [place] [id number]  Thank you for choosing [facility]!

## 2019-09-26 NOTE — Patient Instructions (Signed)
Medication Instructions:  change to Toprol XL ( metoprolol succinate ) 25 mg  One tablet daily   *If you need a refill on your cardiac medications before your next appointment, please call your pharmacy*   Lab Work: Not needed  Testing/Procedures: Not needed   Follow-Up: At [facility], you and your health needs are our priority.  As part of our continuing mission to provide you with exceptional heart care, we have created designated Provider Care Teams.  These Care Teams include your primary Cardiologist (physician) and Advanced Practice Providers (APPs -  Physician Assistants and Nurse Practitioners) who all work together to provide you with the care you need, when you need it.     Your next appointment:   12 month(s)  The format for your next appointment:   In Person  Provider:   Glenetta Hew, MD   Other Instructions

## 2019-09-29 ENCOUNTER — Encounter: Payer: Self-pay | Admitting: Cardiology

## 2019-09-29 NOTE — Assessment & Plan Note (Signed)
Labs look much better on rosuvastatin at current dose.  For now we will continue current dose.  No active myalgias.

## 2019-09-29 NOTE — Assessment & Plan Note (Signed)
Has been stable by recent echocardiogram.  In the absence of any worsening symptoms or exam findings, would probably hold off on relook echo for a few years.  Continue SBE prophylaxis recommendations (has standing dose of ampicillin ordered.  On warfarin

## 2019-09-29 NOTE — Assessment & Plan Note (Signed)
Euvolemic on exam.  Only on low-dose sliding scale Lasix which he is not requiring.  He is on beta-blocker and ARB as well as amlodipine for afterload reduction.  Baseline class I-II NYHA symptoms.

## 2019-09-29 NOTE — Assessment & Plan Note (Signed)
Does not seem like he had any recent breakthrough episodes having switched off of amiodarone.  Tolerating lower dose of Toprol 25 mg tablets (he is currently trying to take one half of the 50 mg tablets).  Plan is to use as needed amiodarone for breakthrough spells. Otherwise continue low-dose beta-blocker.  On warfarin for anticoagulation based on mechanical valve.   With mechanical aortic valve, would be okay to hold warfarin for procedures without need for bridge (however this would mean that he could not undergo cardioversion during the time interval)

## 2019-09-29 NOTE — Assessment & Plan Note (Signed)
Blood pressure is a little high today which is unusual for him.  Currently on 12-1/2 of Toprol and 10 of amlodipine along with 320 mg Diovan.  He is on as needed Lasix, and I suspect that he may require additional non-Lasix diuretic for better blood pressure control.

## 2019-09-29 NOTE — Assessment & Plan Note (Signed)
Pretty much, with exertion graft and negative coronary arteries, he has a diagonal branch and the PDA that are not revascularizable.  All the rest seem to be relatively stable.  The graft the diagonal is closed but there is significant upstream flow which.  He denies any active anginal symptoms on current meds.  Plan: Continue Toprol and amlodipine as well as ARB.  Also rosuvastatin and as needed nitroglycerin.  Not on aspirin or Plavix because of warfarin.  Has not had PCI.

## 2019-11-06 ENCOUNTER — Other Ambulatory Visit: Payer: Self-pay | Admitting: Cardiology

## 2019-11-07 LAB — PROTIME-INR
INR: 3.2 — ABNORMAL HIGH (ref 0.9–1.2)
Prothrombin Time: 32.4 s — ABNORMAL HIGH (ref 9.1–12.0)

## 2019-11-30 ENCOUNTER — Other Ambulatory Visit: Payer: Self-pay | Admitting: Cardiology

## 2019-12-18 ENCOUNTER — Other Ambulatory Visit: Payer: Self-pay | Admitting: Cardiology

## 2019-12-18 LAB — POCT INR: INR: 3.9 — AB (ref 2.0–3.0)

## 2019-12-19 LAB — PROTIME-INR
INR: 3.9 — ABNORMAL HIGH (ref 0.9–1.2)
Prothrombin Time: 38.6 s — ABNORMAL HIGH (ref 9.1–12.0)

## 2019-12-23 ENCOUNTER — Ambulatory Visit (INDEPENDENT_AMBULATORY_CARE_PROVIDER_SITE_OTHER): Payer: Medicare HMO | Admitting: Internal Medicine

## 2019-12-23 DIAGNOSIS — Z952 Presence of prosthetic heart valve: Secondary | ICD-10-CM

## 2019-12-23 DIAGNOSIS — Z7901 Long term (current) use of anticoagulants: Secondary | ICD-10-CM

## 2020-01-11 ENCOUNTER — Other Ambulatory Visit (HOSPITAL_COMMUNITY): Payer: Self-pay | Admitting: Nurse Practitioner

## 2020-01-11 ENCOUNTER — Other Ambulatory Visit: Payer: Self-pay | Admitting: Cardiology

## 2020-01-20 ENCOUNTER — Other Ambulatory Visit: Payer: Self-pay | Admitting: Cardiology

## 2020-01-20 LAB — POCT INR: INR: 4 — AB (ref 2.0–3.0)

## 2020-01-21 LAB — PROTIME-INR
INR: 4 — ABNORMAL HIGH (ref 0.9–1.2)
Prothrombin Time: 40.2 s — ABNORMAL HIGH (ref 9.1–12.0)

## 2020-01-23 ENCOUNTER — Ambulatory Visit (INDEPENDENT_AMBULATORY_CARE_PROVIDER_SITE_OTHER): Payer: Medicare HMO | Admitting: Internal Medicine

## 2020-01-23 DIAGNOSIS — Z952 Presence of prosthetic heart valve: Secondary | ICD-10-CM | POA: Diagnosis not present

## 2020-01-23 DIAGNOSIS — Z7901 Long term (current) use of anticoagulants: Secondary | ICD-10-CM

## 2020-02-05 ENCOUNTER — Other Ambulatory Visit: Payer: Self-pay

## 2020-02-05 ENCOUNTER — Other Ambulatory Visit: Payer: Self-pay | Admitting: Cardiology

## 2020-02-05 DIAGNOSIS — I480 Paroxysmal atrial fibrillation: Secondary | ICD-10-CM

## 2020-02-05 LAB — POCT INR: INR: 4.1 — AB (ref 2.0–3.0)

## 2020-02-06 LAB — PROTIME-INR
INR: 4.1 — ABNORMAL HIGH (ref 0.9–1.2)
Prothrombin Time: 40.4 s — ABNORMAL HIGH (ref 9.1–12.0)

## 2020-02-07 ENCOUNTER — Telehealth: Payer: Self-pay

## 2020-02-07 NOTE — Telephone Encounter (Signed)
Spoke to the pt and informed him that we are still awaiting his INR results.  I told him that we will call him to further discuss once resulted.  He verbalized understanding.

## 2020-02-10 ENCOUNTER — Ambulatory Visit (INDEPENDENT_AMBULATORY_CARE_PROVIDER_SITE_OTHER): Payer: Medicare HMO | Admitting: Cardiology

## 2020-02-10 DIAGNOSIS — Z952 Presence of prosthetic heart valve: Secondary | ICD-10-CM | POA: Diagnosis not present

## 2020-02-10 DIAGNOSIS — Z7901 Long term (current) use of anticoagulants: Secondary | ICD-10-CM

## 2020-02-12 ENCOUNTER — Other Ambulatory Visit: Payer: Self-pay

## 2020-02-12 DIAGNOSIS — I480 Paroxysmal atrial fibrillation: Secondary | ICD-10-CM

## 2020-02-26 LAB — POCT INR: INR: 2.3 (ref 2.0–3.0)

## 2020-02-27 ENCOUNTER — Ambulatory Visit (INDEPENDENT_AMBULATORY_CARE_PROVIDER_SITE_OTHER): Payer: Medicare HMO | Admitting: Cardiovascular Disease

## 2020-02-27 DIAGNOSIS — Z7901 Long term (current) use of anticoagulants: Secondary | ICD-10-CM

## 2020-02-27 DIAGNOSIS — Z952 Presence of prosthetic heart valve: Secondary | ICD-10-CM | POA: Diagnosis not present

## 2020-02-27 LAB — PROTIME-INR
INR: 2.3 — ABNORMAL HIGH (ref 0.9–1.2)
Prothrombin Time: 23.8 s — ABNORMAL HIGH (ref 9.1–12.0)

## 2020-03-11 LAB — PROTIME-INR: INR: 2.3 — AB (ref <=1.1)

## 2020-03-12 LAB — PROTIME-INR
INR: 2.3 — ABNORMAL HIGH (ref 0.9–1.2)
Prothrombin Time: 23.4 s — ABNORMAL HIGH (ref 9.1–12.0)

## 2020-03-13 ENCOUNTER — Ambulatory Visit (INDEPENDENT_AMBULATORY_CARE_PROVIDER_SITE_OTHER): Payer: Medicare HMO | Admitting: Internal Medicine

## 2020-03-13 DIAGNOSIS — Z7901 Long term (current) use of anticoagulants: Secondary | ICD-10-CM | POA: Diagnosis not present

## 2020-03-13 DIAGNOSIS — Z952 Presence of prosthetic heart valve: Secondary | ICD-10-CM

## 2020-03-16 ENCOUNTER — Other Ambulatory Visit: Payer: Self-pay | Admitting: Cardiology

## 2020-03-23 NOTE — Telephone Encounter (Signed)
Chest x-ray was pretty benign from a cardiac standpoint.  With IVUS no evidence of any pleural effusion.   Glenetta Hew, MD .

## 2020-04-15 ENCOUNTER — Ambulatory Visit (INDEPENDENT_AMBULATORY_CARE_PROVIDER_SITE_OTHER): Payer: Medicare HMO | Admitting: Pharmacist

## 2020-04-15 DIAGNOSIS — Z952 Presence of prosthetic heart valve: Secondary | ICD-10-CM | POA: Diagnosis not present

## 2020-04-15 DIAGNOSIS — Z7901 Long term (current) use of anticoagulants: Secondary | ICD-10-CM | POA: Diagnosis not present

## 2020-04-15 LAB — PROTIME-INR
INR: 2.1 — ABNORMAL HIGH (ref 0.9–1.2)
Prothrombin Time: 21.4 s — ABNORMAL HIGH (ref 9.1–12.0)

## 2020-04-28 ENCOUNTER — Other Ambulatory Visit: Payer: Self-pay

## 2020-04-28 ENCOUNTER — Other Ambulatory Visit: Payer: Self-pay | Admitting: Cardiology

## 2020-04-28 DIAGNOSIS — I480 Paroxysmal atrial fibrillation: Secondary | ICD-10-CM

## 2020-04-28 LAB — POCT INR: INR: 3.1 — AB (ref 2.0–3.0)

## 2020-04-29 ENCOUNTER — Ambulatory Visit (INDEPENDENT_AMBULATORY_CARE_PROVIDER_SITE_OTHER): Payer: Medicare HMO | Admitting: Cardiovascular Disease

## 2020-04-29 DIAGNOSIS — Z952 Presence of prosthetic heart valve: Secondary | ICD-10-CM

## 2020-04-29 DIAGNOSIS — Z7901 Long term (current) use of anticoagulants: Secondary | ICD-10-CM | POA: Diagnosis not present

## 2020-04-29 LAB — PROTIME-INR
INR: 3.1 — ABNORMAL HIGH (ref 0.9–1.2)
Prothrombin Time: 30.9 s — ABNORMAL HIGH (ref 9.1–12.0)

## 2020-05-13 ENCOUNTER — Encounter (HOSPITAL_COMMUNITY): Payer: Self-pay

## 2020-05-13 ENCOUNTER — Emergency Department (HOSPITAL_COMMUNITY)
Admission: EM | Admit: 2020-05-13 | Discharge: 2020-05-13 | Disposition: A | Discharge status: Home / Self Care | Payer: Medicare HMO | Attending: Emergency Medicine | Admitting: Emergency Medicine

## 2020-05-13 ENCOUNTER — Telehealth (HOSPITAL_COMMUNITY): Payer: Self-pay

## 2020-05-13 ENCOUNTER — Emergency Department (HOSPITAL_COMMUNITY): Payer: Medicare HMO

## 2020-05-13 ENCOUNTER — Telehealth: Payer: Self-pay | Admitting: Cardiology

## 2020-05-13 ENCOUNTER — Other Ambulatory Visit: Payer: Self-pay

## 2020-05-13 DIAGNOSIS — Z951 Presence of aortocoronary bypass graft: Secondary | ICD-10-CM | POA: Diagnosis not present

## 2020-05-13 DIAGNOSIS — J449 Chronic obstructive pulmonary disease, unspecified: Secondary | ICD-10-CM | POA: Diagnosis not present

## 2020-05-13 DIAGNOSIS — Z87891 Personal history of nicotine dependence: Secondary | ICD-10-CM | POA: Diagnosis not present

## 2020-05-13 DIAGNOSIS — Z7901 Long term (current) use of anticoagulants: Secondary | ICD-10-CM | POA: Diagnosis not present

## 2020-05-13 DIAGNOSIS — Z79899 Other long term (current) drug therapy: Secondary | ICD-10-CM | POA: Insufficient documentation

## 2020-05-13 DIAGNOSIS — I2510 Atherosclerotic heart disease of native coronary artery without angina pectoris: Secondary | ICD-10-CM | POA: Diagnosis not present

## 2020-05-13 DIAGNOSIS — I4891 Unspecified atrial fibrillation: Secondary | ICD-10-CM | POA: Insufficient documentation

## 2020-05-13 DIAGNOSIS — I10 Essential (primary) hypertension: Secondary | ICD-10-CM | POA: Diagnosis not present

## 2020-05-13 LAB — CBC
HCT: 33.5 % — ABNORMAL LOW (ref 39.0–52.0)
Hemoglobin: 12.3 g/dL — ABNORMAL LOW (ref 13.0–17.0)
MCH: 35.9 pg — ABNORMAL HIGH (ref 26.0–34.0)
MCHC: 36.7 g/dL — ABNORMAL HIGH (ref 30.0–36.0)
MCV: 97.7 fL (ref 80.0–100.0)
Platelets: 154 10*3/uL (ref 150–400)
RBC: 3.43 MIL/uL — ABNORMAL LOW (ref 4.22–5.81)
RDW: 15.4 % (ref 11.5–15.5)
WBC: 2.2 10*3/uL — ABNORMAL LOW (ref 4.0–10.5)
nRBC: 0 % (ref 0.0–0.2)

## 2020-05-13 LAB — BASIC METABOLIC PANEL
Anion gap: 12 (ref 5–15)
BUN: 11 mg/dL (ref 8–23)
CO2: 22 mmol/L (ref 22–32)
Calcium: 8.7 mg/dL — ABNORMAL LOW (ref 8.9–10.3)
Chloride: 95 mmol/L — ABNORMAL LOW (ref 98–111)
Creatinine, Ser: 0.95 mg/dL (ref 0.61–1.24)
GFR, Estimated: >60 mL/min (ref >60)
Glucose, Bld: 120 mg/dL — ABNORMAL HIGH (ref 70–99)
Potassium: 4.2 mmol/L (ref 3.5–5.1)
Sodium: 129 mmol/L — ABNORMAL LOW (ref 135–145)

## 2020-05-13 LAB — PROTIME-INR
INR: 3.4 — ABNORMAL HIGH (ref 0.8–1.2)
Prothrombin Time: 33.1 seconds — ABNORMAL HIGH (ref 11.4–15.2)

## 2020-05-13 LAB — TROPONIN I (HIGH SENSITIVITY)
Troponin I (High Sensitivity): 5 ng/L (ref <18)
Troponin I (High Sensitivity): 4 ng/L (ref <18)

## 2020-05-13 MED ORDER — FENTANYL CITRATE (PF) 100 MCG/2ML IJ SOLN
50.0000 ug | Freq: Once | INTRAMUSCULAR | Status: DC
  Filled 2020-05-13: qty 2

## 2020-05-13 MED ORDER — PROPOFOL 10 MG/ML IV BOLUS
0.5000 mg/kg | Freq: Once | INTRAVENOUS | Status: AC
  Administered 2020-05-13: 48.1 mg via INTRAVENOUS
  Filled 2020-05-13: qty 20

## 2020-05-13 MED ORDER — FENTANYL CITRATE (PF) 100 MCG/2ML IJ SOLN
INTRAMUSCULAR | Status: AC | PRN
  Administered 2020-05-13: 50 ug via INTRAVENOUS

## 2020-05-13 NOTE — ED Notes (Signed)
Help get patient on the monitor patient is resting with family at bedside and call bell in reach  

## 2020-05-13 NOTE — ED Provider Notes (Signed)
[facility] EMERGENCY DEPARTMENT Provider Note  CSN: [id number] Arrival date & time: 05/13/20 4098    History Chief Complaint  Patient presents with  . Atrial Fibrillation    HPI  Erik Meyer is a 80 y.o. 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 He reports was back in afib afterwards, managed by Afib clinic medically but converted back to sinus rhythm after a few weeks and has remained in sinus since then. He felt his heart racing last night and found his HR to be elevated from 90-135. He took some left over Amiodarone which he does not routinely take anymore without improvement. He called the cardiology office this morning and was referred to the ED for evaluation. He states he has had 'a little bit' of chest pain. No SOB or lightheadedness.    Past Medical History:  Diagnosis Date  . Anemia   . CAD, multiple vessel 2004   Found during preop cath for [facility] AVR procedure; Myoview 5/'13: No ischemia or infarct, EF 53% --> cath December 2018: Proximal LAD 65% followed by mid LAD 65% then 100% occlusion.  Patent LIMA-LAD.  SVG-D2 occluded.  RPDA occluded. Seq SVG-r PDA-PL patent to PDA, but the PDA is occluded as is the proximal native PDA  . COPD (chronic obstructive pulmonary disease) ([place])   . Diverticulosis   . Dyslipidemia, goal LDL below 70    Monitored by PCP. On statin (last labs scan from September 2015: TC 149, TG 127, HDL 55, LDL 69)  . ED (erectile dysfunction)   . GERD (gastroesophageal reflux disease)   . History of SBO (small bowel obstruction) March 2015   Abdominal surgery with LOA  . Hx of irritable bowel syndrome   . Hypertension, essential   . Lesion of right native kidney  08/20/2013   Abdominal US : 2.4 cm x 1.7 complex lesion in the upper pole the right kidney in March of 2012 concerning for RCC although  enlarging hemorrhagic cyst could have this appearance as well.,   . Monoclonal gammopathy of undetermined significance   . Paroxysmal-persistent atrial fibrillation ([place]); CHA2DS2-VASc Score 4 03/23/2017  . Psoriasis   . S/P AVR (aortic valve replacement) and aortoplasty 2004   Dr. Cyndia BentDeneen Harts procedure - St. Jude aVR (25 mm prosthesis) with aortic root conduit;; Echo 10/'14: EF 55-60%, Mod Conc LVH, Gr 1 DD, Well Seated AoV Mech Prosthesis with normal P gradients (no stenosis), Mod-Severe LA dilation --> follow-up echo November 2016: Well functioning mechanical valve. Paradoxical septal motion. EF 50-55%. Severe LA dilation. Moderate RA dilation.   . S/P CABG x 4 2004   LIMA-LAD, SVG-D1, SVG- PDA-PLA (along with AVR)    Past Surgical History:  Procedure Laterality Date  . ABDOMINAL AORTIC ANEURYSM REPAIR    . AORTIC VALVE REPLACEMENT    . ASD REPAIR  12/26/2002   Aortic valve replacement and replacement of aortic root aneurysm using a 25 mm St. JUDE mechanical valve cinduit with reimplantation of the coronary arteeries  . CARDIAC CATHETERIZATION  Aug  13,2004   diffuse  coronary disease,prior to his BENTALL PROCEDURE  . CORONARY ARTERY BYPASS GRAFT  12/26/2002   LIMA to LAD,SVG to diagonal, SVG to PDA and PLA.   Marland Kitchen CORONARY/GRAFT ANGIOGRAPHY N/A 04/21/2017   Procedure: CORONARY/GRAFT ANGIOGRAPHY;  Surgeon: Leonie Man, MD;  Location: [facility]  LAB: p & mLAD 65% then m100%. Patent LIMA-LAD. 100% SVG-D2 (D1 & D2 fill via native flow. RCA-RPAV patent, 100% ostrPDA, but SVG-rPDA-RPL patent to PDA then occluded.  Marland Kitchen EXERCISE TOLERANCE TEST  09/12/2017    5 METS Baseline ST depression.  No new changes with exercise.  Heart rate went to 141 bpm.  Poor exercise tolerance reaching target heart rate at 2:26-minute,.  No chronotropic Incompetence  . LAPAROSCOPIC LYSIS OF ADHESIONS  March 2015   performed in [place] for SBO   . NM MYOVIEW LTD  10/04/2011   showed no ischemia;EF52%  .  PARTIAL COLECTOMY  1994  . TRANSTHORACIC ECHOCARDIOGRAM  02/2017   [facility]) technically difficult study.  Mild LVH.  Mildly reduced EF of 45-50%.  1+ MR and TR.  Severe LA dilation.  Moderate RV dilation.  Unable to assess diastolic function.  RVP 42 mmHg suggesting mild-moderate pulmonary hypertension.  Mechanical Saint Jude Aortic Valve Prosthesis not well visualized, but gradient 5 mmHg.  Marland Kitchen TRANSTHORACIC ECHOCARDIOGRAM  11/2018    Stable EF 45-50%-moderate HK of apical-anteroseptal wall..  Moderate asymmetric LVH with GR 1 DD.  Normal RV.  RA mildly dilated.  Moderate MAC.  Marland Kitchen VENTRAL HERNIA REPAIR     repair with mesh by Dr Collene Mares at [facility] History  Problem Relation Age of Onset  . Colon cancer Mother   . Congestive Heart Failure Mother        Cardiomyopathy  . Lung cancer Father        Was a chronic smoker  . COPD Sister        End-stage COPD    Social History   Tobacco Use  . Smoking status: Former Smoker    Quit date: 06/16/1997    Years since quitting: 22.9  . Smokeless tobacco: Never Used  Vaping Use  . Vaping Use: Never used  Substance Use Topics  . Alcohol use: Yes  . Drug use: No     Home Medications Prior to Admission medications   Medication Sig Start Date End Date Taking? Authorizing Provider  amLODipine (NORVASC) 10 MG tablet TAKE 1 TABLET EVERY DAY. DUE FOR FOLLOW UP APPOINTMENT IN [facility]  07/03/18   Leonie Man, MD  ampicillin (PRINCIPEN) 500 MG capsule Take 500 mg by mouth daily.  09/04/13   [provider]  DENTA 5000 PLUS 1.1 % CREA dental cream Place 1 Squirt onto teeth daily. 06/19/14   [provider]  enoxaparin (LOVENOX) 100 MG/ML injection Inject 1 mL (100 mg total) into the skin every 12 (twelve) hours. 03/07/19   Leonie Man, MD  Esomeprazole Magnesium (NEXIUM PO) Take by mouth.    [provider]  ferrous sulfate 325 (65 FE) MG tablet Take 1 tablet by mouth 2 (two) times daily.    [provider]  furosemide (LASIX) 20 MG tablet TAKE 1 TO 2 TABLETS EVERY DAY IF NEEDED 12/02/19   Leonie Man, MD  LORazepam (ATIVAN) 0.5 MG tablet Take 0.5 mg by mouth daily.    [provider]  metoprolol succinate (TOPROL XL) 25 MG 24 hr tablet Take 1 tablet (25 mg total) by mouth daily. 09/26/19   Leonie Man, MD  nitroGLYCERIN (NITROSTAT) 0.4 MG SL tablet 1 TAB(S) EVERY 5 MINUTES X 3 AS NEEDED FOR CHEST PAIN AND CALL 911 SUBLINGUALLY 05/08/17   [provider]  omeprazole (PRILOSEC OTC) 20 MG tablet Take 20 mg by mouth daily.  [provider]  potassium chloride SA (KLOR-CON) 20 MEQ tablet TAKE 1 TABLET TWICE DAILY 01/14/20   Leonie Man, MD  ranolazine (RANEXA) 500 MG 12 hr tablet TAKE 1 TABLET TWICE DAILY 03/16/20   Leonie Man, MD  rosuvastatin (CRESTOR) 20 MG tablet Take 1 tablet (20 mg total) by mouth daily. 09/26/19 12/25/19  Leonie Man, MD  tamsulosin (FLOMAX) 0.4 MG CAPS capsule Take 0.4 mg by mouth 2 (two) times daily.     [provider]  Tiotropium Bromide-Olodaterol (STIOLTO RESPIMAT) 2.5-2.5 MCG/ACT AERS Inhale into the lungs daily.    [provider]  valsartan (DIOVAN) 320 MG tablet TAKE 1 TABLET BY MOUTH EVERY DAY 08/08/19   Leonie Man, MD  warfarin (COUMADIN) 5 MG tablet TAKE 1 AND 1/2 TABLETS TO 2 TABLETS DAILY AS DIRECTED BY COUMADIN CLINIC 01/14/20   Leonie Man, MD     Allergies    Doxycycline   Review of Systems   Review of Systems A comprehensive review of systems was completed and negative except as noted in HPI.    Physical Exam BP 117/61   Pulse 90   Temp 98.1 F (36.7 C) (Oral)   Resp 18   SpO2 100%   Physical Exam Vitals and nursing note reviewed.  Constitutional:      Appearance: Normal appearance.  HENT:     Head: Normocephalic and atraumatic.     Nose: Nose normal.     Mouth/Throat:     Mouth: Mucous membranes are moist.  Eyes:     Extraocular Movements: Extraocular  movements intact.     Conjunctiva/sclera: Conjunctivae normal.  Cardiovascular:     Rate and Rhythm: Tachycardia present. Rhythm irregular.     Comments: Valve click Pulmonary:     Effort: Pulmonary effort is normal.     Breath sounds: Normal breath sounds.  Abdominal:     General: Abdomen is flat.     Palpations: Abdomen is soft.     Tenderness: There is no abdominal tenderness.  Musculoskeletal:        General: No swelling. Normal range of motion.     Cervical back: Neck supple.  Skin:    General: Skin is warm and dry.  Neurological:     General: No focal deficit present.     Mental Status: He is alert.  Psychiatric:        Mood and Affect: Mood normal.      ED Results / Procedures / Treatments   Labs (all labs ordered are listed, but only abnormal results are displayed) Labs Reviewed  CBC - Abnormal; Notable for the following components:      Result Value   WBC 2.2 (*)    RBC 3.43 (*)    Hemoglobin 12.3 (*)    HCT 33.5 (*)    MCH 35.9 (*)    MCHC 36.7 (*)    All other components within normal limits  BASIC METABOLIC PANEL  PROTIME-INR  TROPONIN I (HIGH SENSITIVITY)    EKG EKG Interpretation  Date/Time:  Wednesday May 13 2020 10:05:14 EST Ventricular Rate:  109 PR Interval:    QRS Duration: 78 QT Interval:  326 QTC Calculation: 439 R Axis:   23 Text Interpretation: Atrial fibrillation with rapid ventricular response Low voltage QRS Septal infarct , age undetermined Abnormal ECG Since last tracing Atrial fibrillation has replaced Sinus rhythm Confirmed by Calvert Cantor [phone]) on 05/13/2020 10:54:25 AM   Radiology DG Chest 2 View  Result Date: 05/13/2020 CLINICAL DATA:  Chest pain. EXAM: CHEST - 2 VIEW COMPARISON:  March 04, 2017. FINDINGS: Borderline enlarged cardiac silhouette. CABG with median sternotomy. No consolidation. Streaky bibasilar opacities. No visible pneumothorax. Possible trace bilateral pleural effusions versus pleural thickening.  IMPRESSION: 1. Streaky bibasilar opacities, favor atelectasis. 2. Possible trace bilateral pleural effusions versus pleural thickening. Electronically Signed   By: Margaretha Sheffield MD   On: 05/13/2020 10:47    Procedures .Critical Care Performed by: Truddie Hidden, MD Authorized by: Truddie Hidden, MD   Critical care provider statement:    Critical care time (minutes):  45   Critical care time was exclusive of:  Separately billable procedures and treating other patients   Critical care was necessary to treat or prevent imminent or life-threatening deterioration of the following conditions:  Cardiac failure   Critical care was time spent personally by me on the following activities:  Discussions with consultants, evaluation of patient's response to treatment, examination of patient, ordering and performing treatments and interventions, ordering and review of laboratory studies, ordering and review of radiographic studies, pulse oximetry, re-evaluation of patient's condition, obtaining history from patient or surrogate and review of old charts .Sedation  Date/Time: 05/13/2020 1:13 PM Performed by: Truddie Hidden, MD Authorized by: Truddie Hidden, MD   Consent:    Consent obtained:  Verbal and written   Consent given by:  Patient   Risks discussed:  Allergic reaction, dysrhythmia, inadequate sedation, nausea, prolonged hypoxia resulting in organ damage, prolonged sedation necessitating reversal, respiratory compromise necessitating ventilatory assistance and intubation and vomiting   Alternatives discussed:  Analgesia without sedation Universal protocol:    Procedure explained and questions answered to patient or proxy's satisfaction: yes     Relevant documents present and verified: yes     Test results available: yes     Imaging studies available: yes     Required blood products, implants, devices, and special equipment available: yes     Site/side marked: yes      Immediately prior to procedure, a time out was called: yes     Patient identity confirmed:  Verbally with patient and arm band Indications:    Procedure performed:  Cardioversion   Procedure necessitating sedation performed by:  Physician performing sedation Pre-sedation assessment:    Time since last food or drink:  12 hours   ASA classification: class 3 - patient with severe systemic disease     Mouth opening:  3 or more finger widths   Thyromental distance:  4 finger widths   Mallampati score:  II - soft palate, uvula, fauces visible   Neck mobility: normal     Pre-sedation assessments completed and reviewed: airway patency, cardiovascular function, hydration status, mental status, nausea/vomiting, pain level, respiratory function and temperature     Pre-sedation assessment completed:  05/13/2020 12:00 PM Immediate pre-procedure details:    Reassessment: Patient reassessed immediately prior to procedure     Reviewed: vital signs, relevant labs/tests and NPO status     Verified: bag valve mask available, emergency equipment available, intubation equipment available, IV patency confirmed, oxygen available and suction available   Procedure details (see MAR for exact dosages):    Preoxygenation:  Nasal cannula   Sedation:  Propofol   Intended level of sedation: deep   Analgesia:  Fentanyl   Intra-procedure monitoring:  Blood pressure monitoring, cardiac monitor, continuous pulse oximetry, frequent LOC assessments, frequent vital sign checks and continuous capnometry   Intra-procedure events: none  Intra-procedure management:  Supplemental oxygen   Total Provider sedation time (minutes):  10 Post-procedure details:    Post-sedation assessment completed:  05/13/2020 1:14 PM   Attendance: Constant attendance by certified staff until patient recovered     Recovery: Patient returned to pre-procedure baseline     Post-sedation assessments completed and reviewed: airway patency, cardiovascular  function, hydration status, mental status, nausea/vomiting, pain level, respiratory function and temperature     Patient is stable for discharge or admission: yes     Procedure completion:  Tolerated well, no immediate complications .Cardioversion  Date/Time: 05/13/2020 1:14 PM Performed by: Truddie Hidden, MD Authorized by: Truddie Hidden, MD   Consent:    Consent obtained:  Written   Consent given by:  Patient   Risks discussed:  Cutaneous burn, induced arrhythmia and pain   Alternatives discussed:  No treatment and rate-control medication Pre-procedure details:    Cardioversion basis:  Elective   Rhythm:  Atrial fibrillation   Electrode placement:  Anterior-posterior Patient sedated: Yes. Refer to sedation procedure documentation for details of sedation.  Attempt one:    Cardioversion mode:  Synchronous   Waveform:  Biphasic   Shock (Joules):  120   Shock outcome:  Conversion to normal sinus rhythm Post-procedure details:    Patient status:  Awake   Patient tolerance of procedure:  Tolerated well, no immediate complications    Medications Ordered in the ED Medications - No data to display   MDM Rules/Calculators/A&P MDM Patient with recurrence of afib. He is on coumadin, last INR was a couple of weeks ago, therapeutic. Will recheck today to confirm he is still therapeutic before considering cardioversion in the ED. Given reports of chest pain will check labs including Troponin as well.  ED Course  I have reviewed the triage vital signs and the nursing notes.  Pertinent labs & imaging results that were available during my care of the patient were reviewed by me and considered in my medical decision making (see chart for details).  Clinical Course as of 05/13/20 1406  Wed May 13, 2020  1056 CBC with leukopenia and mild anemia unchanged from baseline.  [CS]  1133 BMP with mild hyponatremia, Trop is neg.  [CS]  1211 INR is therapeutic. Remains in afib, rate mostly  around 100-105. Will discuss with cardiology.  [CS]  9373 SKAJG with Dr. Gwenlyn Found, Cardiology, who recommends we attempt electrical cardioversion. Patient amenable, risk and benefits of sedation and cardioversion discussed. He has been NPO since last night.  [CS]  1314 Patient cardioverted to NSR without difficulty. Will continue to monitor. Patient awake and alert after sedation. Brief drop in SpO2 to 90% improved with supplemental oxygen and sternal rub.  [CS]  8115 Patient sitting up, feels well and remains in NSR. Recommend he continue his medications and follow up with Afib clinic. RTED if symptoms return.  [CS]  1406 CHADVAS score is 2, already anticoagulated.  [CS]    Clinical Course User Index [CS] Truddie Hidden, MD    Final Clinical Impression(s) / ED Diagnoses Final diagnoses:  Atrial fibrillation with RVR [facility])    Rx / DC Orders ED Discharge Orders    None       Truddie Hidden, MD 05/13/20 1429

## 2020-05-13 NOTE — Telephone Encounter (Signed)
Reached out to patient to schedule ED f/u. Left voicemail. ?

## 2020-05-13 NOTE — Telephone Encounter (Signed)
Patient c/o Palpitations:  High priority if patient c/o lightheadedness, shortness of breath, or chest pain  1) How long have you had palpitations/irregular HR/ Afib? Are you having the symptoms now? Since last night  2) Are you currently experiencing lightheadedness, SOB or CP? Slight chest pains.  3) Do you have a history of afib (atrial fibrillation) or irregular heart rhythm? AFIB.  4) Have you checked your BP or HR? (document readings if available): HR 135  5) Are you experiencing any other symptoms? Headache.

## 2020-05-13 NOTE — Progress Notes (Signed)
RT at bedside for cardioversion. No complications. RT will continue to be available as needed.

## 2020-05-13 NOTE — ED Triage Notes (Signed)
Pt reports he is back in a fib, checks his HR at home, ranging from 90-135. Mild chest pain, denies SOB, take Eliquis. Hx of ablation. Pt a.o

## 2020-05-13 NOTE — ED Notes (Signed)
Patient verbalizes understanding of discharge instructions. Opportunity for questioning and answers were provided. Armband removed by staff, pt discharged from ED with wife to home

## 2020-05-13 NOTE — Sedation Documentation (Signed)
Electronic synced cardioversion (120J) performed.

## 2020-05-13 NOTE — Telephone Encounter (Signed)
The patient called in with complaints of being in atrial fibrillation possibly since Monday night. He stated that he has been having heart rates in the 120-130's. Today while on the phone it was 135. He has had complaints on mild chest pains and headache.   He stated that he had Amiodarone left at home and was told that if he had break through afib then he should take that. He stated that he did take 4 of the 200 mg Amiodarone last night with no changes.  Due to the the symptoms and no relief with the Amiodarone he has been advised to go to the ED.  He is currently on warfarin and metoprolol 25 mg once daily.

## 2020-05-19 ENCOUNTER — Other Ambulatory Visit: Payer: Self-pay

## 2020-05-19 ENCOUNTER — Encounter: Payer: Self-pay | Admitting: Cardiology

## 2020-05-19 ENCOUNTER — Other Ambulatory Visit: Payer: Self-pay | Admitting: Cardiology

## 2020-05-19 ENCOUNTER — Ambulatory Visit: Payer: Medicare HMO | Admitting: Cardiology

## 2020-05-19 VITALS — BP 148/62 | HR 78 | Ht 69.0 in | Wt 210.0 lb

## 2020-05-19 DIAGNOSIS — I10 Essential (primary) hypertension: Secondary | ICD-10-CM | POA: Diagnosis not present

## 2020-05-19 DIAGNOSIS — I5032 Chronic diastolic (congestive) heart failure: Secondary | ICD-10-CM

## 2020-05-19 DIAGNOSIS — Z951 Presence of aortocoronary bypass graft: Secondary | ICD-10-CM

## 2020-05-19 DIAGNOSIS — Z952 Presence of prosthetic heart valve: Secondary | ICD-10-CM

## 2020-05-19 DIAGNOSIS — E785 Hyperlipidemia, unspecified: Secondary | ICD-10-CM

## 2020-05-19 DIAGNOSIS — R5383 Other fatigue: Secondary | ICD-10-CM

## 2020-05-19 DIAGNOSIS — I25709 Atherosclerosis of coronary artery bypass graft(s), unspecified, with unspecified angina pectoris: Secondary | ICD-10-CM | POA: Diagnosis not present

## 2020-05-19 DIAGNOSIS — I480 Paroxysmal atrial fibrillation: Secondary | ICD-10-CM | POA: Diagnosis not present

## 2020-05-19 DIAGNOSIS — E669 Obesity, unspecified: Secondary | ICD-10-CM

## 2020-05-19 DIAGNOSIS — Z7901 Long term (current) use of anticoagulants: Secondary | ICD-10-CM

## 2020-05-19 MED ORDER — AMIODARONE HCL 200 MG PO TABS: ORAL_TABLET | ORAL | 11 refills | Status: DC

## 2020-05-19 NOTE — Progress Notes (Signed)
Primary Care Provider: Ivan Anchors, MD Cardiologist: Glenetta Hew, MD Electrophysiologist: None  Clinic Note: Chief Complaint  Patient presents with  . Hospitalization Follow-up    ER visit for A. fib RVR--DCCV  . 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 male with a PMH notable for MV-CAD/ TAA/AI-->CABGx4/BENTALL-AVR, PAF, Mild Cardiomyopathy, complicated by chronic fatigue who presents today for hospital follow-up review.    MV-CAD-> s/pCABG/AVR/aortoplasty in 9470 complicated by PAF (on warfarin for mechanical valve),cardiomyopathy and chronic fatigue ? Cardiac Cath 04/11/2017: Native RCA ~patent to PAV/PL, Native LM-LCx(OM1 & OM2), LAD-D1 & D2 then 100% after SP2). Patent LIMA-dLAD, occluded SeqSVG-RPDA-PL & SVG-D2.  ? September 2020: GXT with no evidence of chronotropic incompetence. ? Weaned off of amiodarone, maintained on Toprol 25-> less fatigue with reduced dose  Plan was to allow mild permissive hypertension.  He is amiodarone as needed breakthrough A. fib  Converted to rosuvastatin ? Echo showed EF stable at 45 to 50%. ? Prior to COVID-19 lockdown, you exercise routinely at the [facility] has been trying to get back into that routine  JIANNI BATTEN was last seen on May 2021 noting that his energy was seem to be a little better.  Still limited by back and knee pain.  He was doing 3 days a week in the [facility] gym  and happy to be back.  No angina or heart failure symptoms.  No recurrent symptoms of A. fib. -> He was having a hard time doing this point for progression of dementia.  Lots of extra chores around the house.  Less freedom to go out and exercise.  Beta-blocker converted to Toprol 25 mg  Recent Hospitalizations:   May 13, 2020, presented to ER with A. fib RVR that began on January 3 noted some mild discomfort in headache.  He took amiodarone 40 mg x 2 with no cardioversion.. => Synch-DCCV x 1.   This patients CHA2DS2-VASc Score and unadjusted Ischemic Stroke Rate (% per year) is equal to 11.2 % stroke rate/year from a score of 7  Above score calculated as 1 point each if present [CHF, HTN, DM, Vascular=MI/PAD/Aortic Plaque, Age if 65-74, or Male] Above score calculated as 2 points each if present [Age > 75, or Stroke/TIA/TE]   Reviewed  CV studies:    The following studies were reviewed today: (if available, images/films reviewed: From Epic Chart or Care Everywhere) . 05/13/2020: Synch DCCV  Interval History:   STELLA ENCARNACION presents here today as a follow-up from his ER visit.  Basically on the third he started feeling irregular heartbeats going fast.  It was associated with shortness of breath and fatigue.  He felt the irregular heartbeats and was just really worn out.  He took the amiodarone, but did not convert.  He therefore contacted [place] and went to the ER where he underwent cardioversion.  Immediately after cardioversion he felt much better.  Basically, the initial symptom he notes is that he can hear the irregular heartbeat because of his prosthetic valve.  Despite having the A. fib, he denied any chest pain or pressure with rest or exertion.  He felt a little bit of fatigue and maybe some mild exertional dyspnea, but no dyspnea at rest.  He is not sure what may be the trigger, but it may be related to increased stress with his wife's health.  She is having declining dementia and  this has been a big stressor for him.  The holidays were especially difficult.  Since his cardioversion, and outside of the spell of A. fib, he has been doing fine.  No PND orthopnea with trivial edema.  He says his blood pressures are little off today usually they are in the 120 to 130 mmHg range systolic.  CV Review of Symptoms (Summary) since cardioversion in the ER: no chest pain or dyspnea on exertion positive for - Mild exertional dyspnea because of deconditioning.  Intermittent headaches negative for - chest pain, edema, irregular heartbeat, orthopnea, palpitations, paroxysmal nocturnal dyspnea, rapid heart rate, shortness of breath or Lightheadedness or dizziness, syncope/near syncope or TIA/amaurosis fugax; claudication  The patient does have symptoms concerning for COVID-19 infection (fever, chills, cough, or new shortness of breath).   REVIEWED OF SYSTEMS   ROS   I have reviewed and (if needed) personally updated the patient's problem list, medications, allergies, past medical and surgical history, social and family history.   PAST MEDICAL HISTORY   Past Medical History:  Diagnosis Date  . Anemia   . CAD, multiple vessel 12/2002   Found during preop cath for [facility] AVR procedure; Myoview 5/'13: No ischemia or infarct, EF 53% --> cath December 2018: Proximal LAD 65% followed by mid LAD 65% then 100% occlusion.  Patent LIMA-LAD.  SVG-D2 occluded.  RPDA occluded. Seq SVG-r PDA-PL patent to PDA, but the PDA is occluded as is the proximal native PDA  . COPD (chronic obstructive pulmonary disease) ([place])   . Diverticulosis   . Dyslipidemia, goal LDL below 70    Monitored by PCP. On statin (last labs scan from September 2015: TC 149, TG 127, HDL 55, LDL 69)  . ED (erectile dysfunction)   . GERD (gastroesophageal reflux disease)   . History of SBO (small bowel obstruction) March 2015   Abdominal surgery with LOA  . Hx of irritable bowel syndrome   . Hypertension, essential   .  Lesion of right native kidney  08/20/2013   Abdominal US : 2.4 cm x 1.7 complex lesion in the upper pole the right kidney in March of 2012 concerning for RCC although enlarging hemorrhagic cyst could have this appearance as well.,   . Monoclonal gammopathy of undetermined significance   . Paroxysmal-persistent atrial fibrillation ([place]); CHA2DS2-VASc Score 7 03/23/2017   CHA2DS2-VASc (CHF, HTN, CAD, agex2, TIAx2 = 7)-> Most recent episode January 3-5, 2022; unsuccessful cardioversion with amiodarone; -> went to ER-synchronized DCCV x1  . Psoriasis   . 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 Cyndia Bent: Deneen Harts procedure - St. Jude aVR (25 mm prosthesis) with aortic root conduit;; Echo 10/'14: EF 55-60%, Mod Conc LVH, Gr 1 DD, Well Seated AoV Mech Prosthesis with normal P gradients (no stenosis), Mod-Severe LA dilation --> follow-up echo November 2016: Well functioning mechanical valve. Paradoxical septal motion. EF 50-55%. Severe LA dilation. Moderate RA dilation.   . 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   LIMA-LAD, SVG-D1, SVG- PDA-PLA (along  with AVR)    PAST SURGICAL HISTORY   Past Surgical History:  Procedure Laterality Date  . AORTIC VALVE REPLACEMENT  12/26/2002   CG mechanical mechanical AVR as part of Bentall procedure  . BENTALL PROCEDURE  12/26/2002   Aortic valve replacement and replacement of aortic root aneurysm using a 25 mm St. JUDE mechanical valve conduit with reimplantation of the coronary arteries  . CARDIAC CATHETERIZATION  Aug  13,2004   diffuse  coronary disease,prior to his BENTALL PROCEDURE  . CORONARY ARTERY BYPASS GRAFT  12/26/2002   LIMA to LAD,SVG to diagonal, SVG to PDA and PLA.   Marland Kitchen CORONARY/GRAFT ANGIOGRAPHY N/A 04/21/2017   Procedure: CORONARY/GRAFT ANGIOGRAPHY;  Surgeon: Leonie Man, MD;  Location: [facility] INVASIVE CV LAB: p & mLAD 65% then m100%. Patent LIMA-LAD. 100% SVG-D2 (D1 & D2 fill via native flow. RCA-RPAV patent, 100% ostrPDA, but SVG-rPDA-RPL  patent to PDA then occluded.  Marland Kitchen EXERCISE TOLERANCE TEST  09/12/2017    5 METS Baseline ST depression.  No new changes with exercise.  Heart rate went to 141 bpm.  Poor exercise tolerance reaching target heart rate at 2:26-minute,.  No chronotropic Incompetence  . LAPAROSCOPIC LYSIS OF ADHESIONS  March 2015   performed in [place] for SBO   . NM MYOVIEW LTD  10/04/2011   09/2011: showed no ischemia;EF52%; 03/2017 - [facility] [place].  Mild LV dilation.  EF 45 to 50%..  Concern for prior infarct in the inferior lateral wall and apical anterior wall consistent with infarct with peri-infarct ischemia. => On review, these findings correlate with cardiac catheter showing inferior infarct and anterior infarct.  Marland Kitchen PARTIAL COLECTOMY  1994  . THORACIC AORTIC ANEURYSM REPAIR  2004   Saint Jude aortic valve conduit-Bentall AVR  . TRANSTHORACIC ECHOCARDIOGRAM  02/2017   [facility]) technically difficult study.  Mild LVH.  Mildly reduced EF of 45-50%.  1+ MR and TR.  Severe LA dilation.  Moderate RV dilation.  Unable to assess diastolic function.  RVP 42 mmHg suggesting mild-moderate pulmonary hypertension.  Mechanical Saint Jude Aortic Valve Prosthesis not well visualized, but gradient 5 mmHg.  Marland Kitchen TRANSTHORACIC ECHOCARDIOGRAM  11/2018    Stable EF 45-50%-moderate HK of apical-anteroseptal wall..  Moderate asymmetric LVH with GR 1 DD.  Normal RV.  RA mildly dilated.  Moderate MAC.  Marland Kitchen VENTRAL HERNIA REPAIR     repair with mesh by Dr Collene Mares at [facility]     Cardiac Cath 04/11/2017: Native RCA ~patent to PAV/PL, Native LM-LCx(OM1 & OM2), LAD-D1 & D2 then 100% after SP2). Patent LIMA-dLAD, occluded SeqSVG-RPDA-PL & SVG-D2.   Findings correlated well with the stress test showing inferior infarct as well as anterior infarct.  Continue medical management    There is no immunization history on file for this patient.  MEDICATIONS/ALLERGIES   Current Meds  Medication Sig  . amiodarone  (PACERONE) 200 MG tablet Take as directed  . amLODipine (NORVASC) 10 MG tablet TAKE 1 TABLET EVERY DAY. DUE FOR FOLLOW UP APPOINTMENT IN APRIL   . ampicillin (PRINCIPEN) 500 MG capsule Take 500 mg by mouth daily.   . DENTA 5000 PLUS 1.1 % CREA dental cream Place 1 Squirt onto teeth daily.  Marland Kitchen enoxaparin (LOVENOX) 100 MG/ML injection Inject 1 mL (100 mg total) into the skin every 12 (twelve) hours.  . Esomeprazole Magnesium (NEXIUM PO) Take by mouth.  . ferrous sulfate 325 (65 FE) MG tablet Take 1 tablet by mouth 2 (two) times daily.  . furosemide (  LASIX) 20 MG tablet TAKE 1 TO 2 TABLETS EVERY DAY IF NEEDED  . LORazepam (ATIVAN) 0.5 MG tablet Take 0.5 mg by mouth daily.  . metoprolol succinate (TOPROL XL) 25 MG 24 hr tablet Take 1 tablet (25 mg total) by mouth daily.  . nitroGLYCERIN (NITROSTAT) 0.4 MG SL tablet 1 TAB(S) EVERY 5 MINUTES X 3 AS NEEDED FOR CHEST PAIN AND CALL 911 SUBLINGUALLY  . omeprazole (PRILOSEC OTC) 20 MG tablet Take 20 mg by mouth daily.  . potassium chloride SA (KLOR-CON) 20 MEQ tablet TAKE 1 TABLET TWICE DAILY  . ranolazine (RANEXA) 500 MG 12 hr tablet TAKE 1 TABLET TWICE DAILY  . tamsulosin (FLOMAX) 0.4 MG CAPS capsule Take 0.4 mg by mouth 2 (two) times daily.   . Tiotropium Bromide-Olodaterol (STIOLTO RESPIMAT) 2.5-2.5 MCG/ACT AERS Inhale into the lungs daily.  . valsartan (DIOVAN) 320 MG tablet TAKE 1 TABLET BY MOUTH EVERY DAY  . warfarin (COUMADIN) 5 MG tablet TAKE 1 AND 1/2 TABLETS TO 2 TABLETS DAILY AS DIRECTED BY COUMADIN CLINIC    Allergies  Allergen Reactions  . Doxycycline Rash    SOCIAL HISTORY/FAMILY HISTORY   Reviewed in Epic:  Pertinent findings:  Social History   Tobacco Use  . Smoking status: Former Smoker    Quit date: 06/16/1997    Years since quitting: 22.9  . Smokeless tobacco: Never Used  Vaping Use  . Vaping Use: Never used  Substance Use Topics  . Alcohol use: Yes  . Drug use: No   Social History   Social History Narrative    Married gentleman with no children. Quit smoking in 1998.   Education: 2 years of college. He does drink caffeine.  He drinks social beer.   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 Claiborne Billings in [place]; GI: Dr. Janus Molder in [place]      **November 2020: He notes increased stress family to care for his wife who is showing signs of worsening dementia.  Hard to adjust to this and is somewhat depressing.    -He is try to work on cutting back on his intake of salt specific    OBJCTIVE -PE, EKG, labs   Wt Readings from Last 3 Encounters:  05/19/20 210 lb (95.3 kg)  05/13/20 212 lb (96.2 kg)  09/26/19 210 lb (95.3 kg)    Physical Exam: BP (!) 148/62   Pulse 78   Ht _0  (1.753 m)   Wt 210 lb (95.3 kg)   SpO2 98%   BMI 31.01 kg/m  Physical Exam  Well-groomed.  Healthy-appearing.  HENT:  Head: Normocephalic and atraumatic.  Neck: No hepatojugular reflux and no JVD present. Carotid bruit is not present.  Cardiovascular: Normal rate, regular rhythm, S1 normal and intact distal pulses.  Occasional extrasystoles are present. Exam reveals distant heart sounds. Exam reveals no gallop and no friction rub.  Murmur heard.  Medium-pitched harsh crescendo-decrescendo midsystolic murmur is present with a grade of 2/6 at the upper right sternal border radiating to the neck. Mechanical S2    Adult ECG Report  Rate: 78;  Rhythm: normal sinus rhythm and 1  AVB, septal MI, age undetermined.  Otherwise normal axis, intervals and durations.;   Narrative Interpretation: Stable EKG.  Recent Labs: Reviewed.  Labs were checked by PCP in October, but not  lipids. CBC And Differential Component 01/01/20 07/01/19 06/28/19  WBC 2.3 2.4Low 2.0Low  RBC 3.63 3.52Low 3.56Low  HGB 12.4 11.8Low 12.3Low  HCT 35.7 34.1Low 34.0Low   MCV 98.3 97High 96  MCH 34.2 33.5High 34.6High  MCHC 34.7 34.6 36.2High  Plt Ct 118 119Low 116Low   Comprehensive Metabolic Panel Component 67/34/19 07/01/19  Glucose 142 92  BUN 11 11  Creatinine 0.94 0.81  eGFR If NonAfrican American 78 86  eGFR If African American 90  100  BUN/Creatinine Ratio 12 14  Sodium 133 132Low  Potassium 4.1 4.4  Chloride 98 95Low  CO2 24 24  CALCIUM 8.8 8.8  Total Protein 5.8 5.9Low  Albumin, Serum 4.2 3.9  Globulin, Total 1.6 2  Albumin/Globulin Ratio 2.6 2  Total Bilirubin 0.5 0.5  Alkaline Phosphatase 85 71  AST 15 14  ALT (SGPT) 14 16    Lab Results  Component Value Date   CHOL 152 09/19/2019   HDL 76 09/19/2019   LDLCALC 61 09/19/2019   TRIG 80 09/19/2019   CHOLHDL 2.0 09/19/2019   Lab Results  Component Value Date   CREATININE 0.95 05/13/2020   BUN 11 05/13/2020   NA 129 (L) 05/13/2020   K 4.2 05/13/2020   CL 95 (L) 05/13/2020   CO2 22 05/13/2020   CBC Latest Ref Rng & Units 05/13/2020 04/21/2017 03/04/2017  WBC 4.0 - 10.5 K/uL 2.2(L) 2.2(L) 9.1  Hemoglobin 13.0 - 17.0 g/dL 12.3(L) 12.4(L) 12.9(L)  Hematocrit 39.0 - 52.0 % 33.5(L) 36.0(L) 37.4(L)  Platelets 150 - 400 K/uL 154 110(L) 176    No results found for: TSH  ASSESSMENT/PLAN    Problem List Items Addressed This Visit    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  Will be due for follow-up echocardiogram and end of this year.  Continue warfarin  He is aware of the need for SBE prophylaxis--has a standing ampicillin order.      Coronary artery disease involving coronary bypass graft of native heart with angina pectoris (HCC) (Chronic)    He seems to doing overall well since his last cath he has an occluded vein graft to the RCA which essentially leaves the PDA without perfusion, the native RCA is patent to the PL system.  There is also occluded vein graft to the diagonal which is getting native  flow. 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  He is on amlodipine and Toprol along with Ranexa for anginal benefit.  He did well despite having A. fib RVR with no anginal symptoms. He would be due for ischemic evaluation at the end of this year.  We can discuss at follow-up visit.  Plan: Continue current dose of Toprol 25 mg for now but low threshold to increase  Continue amlodipine and Ranexa  Not on aspirin or Plavix because of long-term warfarin.      Relevant Medications   amiodarone (PACERONE) 200 MG tablet   Other Relevant Orders   EKG 12-Lead (Completed)   S/P CABG x 4 (Chronic)    Will be due for Myoview at the end of this year.  Discuss timing at follow-up visit.      Paroxysmal-persistent atrial fibrillation ([place]); CHA2DS2-VASc Score 4 - Primary (Chronic)    This was his first breakthrough in quite some time. Unfortunately, amiodarone did not seem to work for cardioversion.  We will continue to try chemical cardioversion first prior to going for cardioversion. Continue warfarin with goal INR 2.5-3.5.  FOR BREAK THROUGH ATRIAL FIB=TAKE AMIODARONE 200 MG 2 TABLETS TWICE DAILY X 7 DAYS= IF STILL IN ATRIAL FIB AFTER THE 3-4 DOSE CALL THE OFFICE -> If he does not convert, would only continue for 2 days      Relevant Medications   amiodarone (PACERONE) 200 MG tablet   Chronic diastolic heart failure (HCC) (Chronic)    Euvolemic on exam.  Tolerated A. fib without significant symptoms.  He is on standing dose of furosemide which keeps him relatively well controlled.  He takes 2 tablets on nonswimming days otherwise he takes 1 tablet.  He is on high-dose valsartan and amlodipine along with low-dose Toprol for afterload reduction and rate control.      Relevant Medications   amiodarone (PACERONE) 200 MG tablet   Dyslipidemia, goal LDL below 70 (Chronic)    Lipids as of May look good.  Continue current dose of rosuvastatin.      Relevant Medications   amiodarone  (PACERONE) 200 MG tablet   Hypertension, essential (Chronic)    Blood pressure is still little high.  Again he says that this is unusual for him. I have asked that he check a blood pressure log over the next 2 months and keep [place] posted.  We could potentially increase Toprol dose, but he is on max dose amlodipine and valsartan.  He is on standing dose of Lasix, and I would prefer to use Lasix over and not on loop diuretic.  Follow-up blood pressure log in 2 months.      Relevant Medications   amiodarone (PACERONE) 200 MG tablet   Obesity (BMI 30-39.9) (Chronic)    He is still borderline obese, but is very active.  He walks and does swimming most days of the week.  He enjoys going to the [facility].  He says he does not have the energy he used to have, but is still maintaining a good level exercise.  Hopefully with dietary reduction, he can continue to try to lose weight.      Long term current use of anticoagulant therapy (Chronic)   Fatigue due to treatment    Felt to be related to chronotropic incompetence.  We stopped his amiodarone and reduce beta-blocker dose.  If his blood pressures continue to increase, would probably convert to carvedilol for more blood pressure control than rate control.          COVID-19 Education: The signs and symptoms of COVID-19 were discussed with the patient and how to seek care for testing (follow up with PCP or arrange E-visit).   The importance of social distancing and COVID-19 vaccination was discussed today.  The patient is practicing social distancing & Masking.   I spent a total of 52 minutes with the patient spent in direct patient consultation.  Additional time spent with chart review  / charting (studies, outside notes, etc): 11 min Total Time: 63 min   Current medicines are reviewed at length with the patient today.  (+/- concerns) N/A  This visit occurred during the SARS-CoV-2 public health emergency.  Safety protocols were in place,  including screening questions prior to the visit, additional usage of staff PPE, and extensive cleaning of exam room while observing appropriate contact time as indicated for disinfecting solutions.  Notice: This dictation was prepared with Dragon dictation along with smaller phrase technology. Any transcriptional errors that result from this process are unintentional and may not be corrected upon review.  Patient Instructions / Medication Changes & Studies & Tests Ordered  Patient Instructions  Medication Instructions:   FOR BREAK THROUGH ATRIAL FIB=TAKE AMIODARONE 200 MG 2 TABLETS TWICE DAILY X 7 DAYS= IF STILL IN ATRIAL FIB AFTER THE 3-4 DOSE CALL THE OFFICE  *If you need a refill on your cardiac medications before your next appointment, please call your pharmacy*   Follow-Up: At [facility], you and your health needs are our priority.  As part of our continuing mission to provide you with exceptional heart care, we have created designated Provider Care Teams.  These Care Teams include your primary Cardiologist (physician) and Advanced Practice Providers (APPs -  Physician Assistants and Nurse Practitioners) who all work together to provide you with the care you need, when you need it.  We recommend signing up for the patient portal called "MyChart".  Sign up information is provided on this After Visit Summary.  MyChart is used to connect with patients for Virtual Visits (Telemedicine).  Patients are able to view lab/test results, encounter notes, upcoming appointments, etc.  Non-urgent messages can be sent to your provider as well.   To learn more about what you can do with MyChart, go to [email or web address].    Your next appointment:   9 month(s)  The format for your next appointment:   In Person  Provider:   Glenetta Hew, MD   Other Instructions  TRACK BLOOD PRESSURE GOAL IS LESS THAN 140-LET us KNOW HOW IT IS DOING IN MARCH OR SOONER IF ELEVATED    Studies Ordered:    Orders Placed This Encounter  Procedures  . EKG 12-Lead     Glenetta Hew, M.D., M.S. Interventional Cardiologist   Pager # [phone] Phone # [phone] [address] [place], [place] [address]   Thank you for choosing Heartcare at [facility]!!

## 2020-05-19 NOTE — Patient Instructions (Signed)
Medication Instructions:   FOR BREAK THROUGH ATRIAL FIB=TAKE AMIODARONE 200 MG 2 TABLETS TWICE DAILY X 7 DAYS= IF STILL IN ATRIAL FIB AFTER THE 3-4 DOSE CALL THE OFFICE  *If you need a refill on your cardiac medications before your next appointment, please call your pharmacy*   Follow-Up: At [facility], you and your health needs are our priority.  As part of our continuing mission to provide you with exceptional heart care, we have created designated Provider Care Teams.  These Care Teams include your primary Cardiologist (physician) and Advanced Practice Providers (APPs -  Physician Assistants and Nurse Practitioners) who all work together to provide you with the care you need, when you need it.  We recommend signing up for the patient portal called "MyChart".  Sign up information is provided on this After Visit Summary.  MyChart is used to connect with patients for Virtual Visits (Telemedicine).  Patients are able to view lab/test results, encounter notes, upcoming appointments, etc.  Non-urgent messages can be sent to your provider as well.   To learn more about what you can do with MyChart, go to [email or web address].    Your next appointment:   9 month(s)  The format for your next appointment:   In Person  Provider:   Glenetta Hew, MD   Other Instructions  TRACK BLOOD PRESSURE GOAL IS LESS THAN 140-LET us KNOW HOW IT IS DOING IN MARCH OR SOONER IF ELEVATED

## 2020-05-27 ENCOUNTER — Other Ambulatory Visit: Payer: Self-pay | Admitting: Cardiology

## 2020-05-28 ENCOUNTER — Encounter: Payer: Self-pay | Admitting: Cardiology

## 2020-05-28 ENCOUNTER — Ambulatory Visit (INDEPENDENT_AMBULATORY_CARE_PROVIDER_SITE_OTHER): Payer: Medicare HMO | Admitting: Pharmacist Clinician (PhC)/ Clinical Pharmacy Specialist

## 2020-05-28 DIAGNOSIS — Z952 Presence of prosthetic heart valve: Secondary | ICD-10-CM

## 2020-05-28 DIAGNOSIS — Z7901 Long term (current) use of anticoagulants: Secondary | ICD-10-CM | POA: Diagnosis not present

## 2020-05-28 LAB — PROTIME-INR
INR: 4 — ABNORMAL HIGH (ref 0.9–1.2)
Prothrombin Time: 39.8 s — ABNORMAL HIGH (ref 9.1–12.0)

## 2020-05-28 LAB — POCT INR: INR: 4 — AB (ref 2.0–3.0)

## 2020-05-28 NOTE — Assessment & Plan Note (Signed)
Felt to be related to chronotropic incompetence.  We stopped his amiodarone and reduce beta-blocker dose.  If his blood pressures continue to increase, would probably convert to carvedilol for more blood pressure control than rate control.

## 2020-05-28 NOTE — Assessment & Plan Note (Signed)
Blood pressure is still little high.  Again he says that this is unusual for him. I have asked that he check a blood pressure log over the next 2 months and keep [place] posted.  We could potentially increase Toprol dose, but he is on max dose amlodipine and valsartan.  He is on standing dose of Lasix, and I would prefer to use Lasix over and not on loop diuretic.  Follow-up blood pressure log in 2 months.

## 2020-05-28 NOTE — Assessment & Plan Note (Signed)
Euvolemic on exam.  Tolerated A. fib without significant symptoms.  He is on standing dose of furosemide which keeps him relatively well controlled.  He takes 2 tablets on nonswimming days otherwise he takes 1 tablet.  He is on high-dose valsartan and amlodipine along with low-dose Toprol for afterload reduction and rate control.

## 2020-05-28 NOTE — Assessment & Plan Note (Signed)
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  There is also occluded vein graft to the diagonal which is getting native flow. 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  He is on amlodipine and Toprol along with Ranexa for anginal benefit.  He did well despite having A. fib RVR with no anginal symptoms. He would be due for ischemic evaluation at the end of this year.  We can discuss at follow-up visit.  Plan: Continue current dose of Toprol 25 mg for now but low threshold to increase  Continue amlodipine and Ranexa  Not on aspirin or Plavix because of long-term warfarin.

## 2020-05-28 NOTE — Assessment & Plan Note (Signed)
Will be due for Myoview at the end of this year.  Discuss timing at follow-up visit.

## 2020-05-28 NOTE — Assessment & Plan Note (Addendum)
This was his first breakthrough in quite some time. Unfortunately, amiodarone did not seem to work for cardioversion.  We will continue to try chemical cardioversion first prior to going for cardioversion. Continue warfarin with goal INR 2.5-3.5.  FOR BREAK THROUGH ATRIAL FIB=TAKE AMIODARONE 200 MG 2 TABLETS TWICE DAILY X 7 DAYS= IF STILL IN ATRIAL FIB AFTER THE 3-4 DOSE CALL THE OFFICE -> If he does not convert, would only continue for 2 days

## 2020-05-28 NOTE — Assessment & Plan Note (Signed)
He is still borderline obese, but is very active.  He walks and does swimming most days of the week.  He enjoys going to the [facility].  He says he does not have the energy he used to have, but is still maintaining a good level exercise.  Hopefully with dietary reduction, he can continue to try to lose weight.

## 2020-05-28 NOTE — Assessment & Plan Note (Signed)
Stable echo and 2020.  Will be due for follow-up echocardiogram and end of this year.  Continue warfarin  He is aware of the need for SBE prophylaxis--has a standing ampicillin order.

## 2020-05-28 NOTE — Assessment & Plan Note (Signed)
Lipids as of May look good.  Continue current dose of rosuvastatin.

## 2020-05-29 ENCOUNTER — Telehealth: Payer: Self-pay

## 2020-05-29 NOTE — Telephone Encounter (Signed)
   [facility] Pre-operative Risk Assessment    Request for surgical clearance:  1. What type of surgery is being performed? ESI   2. When is this surgery scheduled? TBD   3. What type of clearance is required (medical clearance vs. Pharmacy clearance to hold med vs. Both)? PHARMACY  4. Are there any medications that need to be held prior to surgery and how long? COUMADIN-7 DAYS PRIOR   5. Practice name and name of physician performing surgery? [facility]  DAVID O'TOOLE, MD  6. What is the office phone number? [phone]   7.   What is the office fax number? [phone]  8.   Anesthesia type (None, local, MAC, general) ? NONE LISTED

## 2020-05-29 NOTE — Telephone Encounter (Signed)
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    Procedure: ESI Date of procedure: TBD  CHA2DS2-VASc Score = 5  This indicates a 7.2% annual risk of stroke. The patient's score is based upon: CHF History: Yes HTN History: Yes Diabetes History: No Stroke History: No Vascular Disease History: Yes Age Score: 2 Gender Score: 0  CrCl 61mL/min Platelet count 154K  Per office protocol, patient can hold warfarin for 5 days prior to procedure; 7 day hold is not needed as 5 day hold will still ensure adequate warfarin washout. He will require bridging with Lovenox 100mg  BID in order to hold his warfarin for 5 days due to his history of mechanical AVR and afib.  Patient cannot interrupt his anticoagulation for 4 weeks after his DCCV which was on 05/13/20. Earliest date he could start holding would be on 2/2 for a procedure date the following week.  Also need to clarify with pt if he has had a TIA. It's not listed under his PMH or problem list, however the afib diagnosis under his PMH states CHADS2VASc score of 7 and includes a TIA which isn't mentioned anywhere else in his chart notes or history.

## 2020-05-29 NOTE — Telephone Encounter (Signed)
   Primary Cardiologist: Glenetta Hew, MD  Chart reviewed as part of pre-operative protocol coverage.   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    Procedure: ESI Date of procedure: TBD  CHA2DS2-VASc Score = 5  This indicates a 7.2% annual risk of stroke. The patient's score is based upon: CHF History: Yes HTN History: Yes Diabetes History: No Stroke History: No Vascular Disease History: Yes Age Score: 2 Gender Score: 0  CrCl 29mL/min Platelet count 154K  Per office protocol, patient can hold warfarin for 5 days prior to procedure; 7 day hold is not needed as 5 day hold will still ensure adequate warfarin washout. He will require bridging with Lovenox 100mg  BID in order to hold his warfarin for 5 days due to his history of mechanical AVR and afib.  Patient cannot interrupt his anticoagulation for 4 weeks after his DCCV which was on 05/13/20. Earliest date he could start holding would be on 2/2 for a procedure date the following week.   I will route this recommendation to the requesting party via Epic fax function and remove from pre-op pool.  Please call with questions.  Jossie Ng. Kitty Cadavid NP-C    05/29/2020, 3:46 PM [place] [address] Office [phone] Fax [phone]

## 2020-06-03 ENCOUNTER — Other Ambulatory Visit: Payer: Self-pay | Admitting: Cardiology

## 2020-06-03 LAB — PROTIME-INR: INR: 3 — AB (ref 0.9–1.1)

## 2020-06-04 ENCOUNTER — Ambulatory Visit (INDEPENDENT_AMBULATORY_CARE_PROVIDER_SITE_OTHER): Payer: Medicare HMO | Admitting: Pharmacist Clinician (PhC)/ Clinical Pharmacy Specialist

## 2020-06-04 DIAGNOSIS — Z7901 Long term (current) use of anticoagulants: Secondary | ICD-10-CM

## 2020-06-04 DIAGNOSIS — Z952 Presence of prosthetic heart valve: Secondary | ICD-10-CM | POA: Diagnosis not present

## 2020-06-04 LAB — PROTIME-INR
INR: 3 — ABNORMAL HIGH (ref 0.9–1.2)
Prothrombin Time: 30.5 s — ABNORMAL HIGH (ref 9.1–12.0)

## 2020-06-25 ENCOUNTER — Telehealth: Payer: Self-pay

## 2020-06-25 DIAGNOSIS — Z952 Presence of prosthetic heart valve: Secondary | ICD-10-CM

## 2020-06-25 DIAGNOSIS — I480 Paroxysmal atrial fibrillation: Secondary | ICD-10-CM

## 2020-06-25 MED ORDER — ENOXAPARIN SODIUM 100 MG/ML ~~LOC~~ SOLN: 100.0000 mg | Freq: Two times a day (BID) | SUBCUTANEOUS | 0 refills | Status: DC

## 2020-06-25 NOTE — Telephone Encounter (Signed)
Called patient and let him know Rx was sent to pharmacy.  Sent instructions to patient via myChart,.  Patient confirmed he received them

## 2020-06-25 NOTE — Telephone Encounter (Signed)
Pt called in stated need lovenox for procedure on 25th. Will route to pharmd pool to address

## 2020-06-25 NOTE — Telephone Encounter (Signed)
Enoxaparin 100mg /ml sent to pharmacy  2/19: Last dose of warfarin.  2/20: No warfarin or enoxaparin (Lovenox).  2/21: Inject enoxaparin 100mg  in the fatty abdominal tissue at least 2 inches from the belly button twice a day about 12 hours apart, 8am and 8pm rotate sites. No warfarin.  2/22: Inject enoxaparin in the fatty tissue every 12 hours, 8am and 8pm. No warfarin.  2/23: Inject enoxaparin in the fatty tissue every 12 hours, 8am and 8pm. No warfarin.  2/24 : Inject enoxaparin in the fatty tissue in the morning at 8 am (No PM dose). No warfarin.  2/25 : Procedure Day - No enoxaparin - Resume warfarin in the evening or as directed by doctor (take an extra half tablet with usual dose for 2 days then resume normal dose).  2/26: Resume enoxaparin inject in the fatty tissue every 12 hours and take warfarin  2/27: Inject enoxaparin in the fatty tissue every 12 hours and take warfarin  2/28: Inject enoxaparin in the fatty tissue every 12 hours and take warfarin  3/1: Inject enoxaparin in the fatty tissue every 12 hours and take warfarin  3/2: Inject enoxaparin in the fatty tissue every 12 hours and take warfarin  3/3: warfarin appt to check INR.

## 2020-07-01 MED ORDER — NITROGLYCERIN 0.4 MG SL SUBL: 0.4000 mg | SUBLINGUAL_TABLET | SUBLINGUAL | 3 refills | Status: DC | PRN

## 2020-07-02 ENCOUNTER — Other Ambulatory Visit: Payer: Self-pay | Admitting: Cardiology

## 2020-07-02 LAB — PROTIME-INR: INR: 1.3 — AB (ref <=1.1)

## 2020-07-03 ENCOUNTER — Other Ambulatory Visit: Payer: Self-pay | Admitting: Cardiology

## 2020-07-03 ENCOUNTER — Ambulatory Visit (INDEPENDENT_AMBULATORY_CARE_PROVIDER_SITE_OTHER): Payer: Medicare HMO | Admitting: Cardiology

## 2020-07-03 ENCOUNTER — Other Ambulatory Visit: Payer: Self-pay

## 2020-07-03 DIAGNOSIS — Z7901 Long term (current) use of anticoagulants: Secondary | ICD-10-CM | POA: Diagnosis not present

## 2020-07-03 DIAGNOSIS — Z952 Presence of prosthetic heart valve: Secondary | ICD-10-CM

## 2020-07-03 LAB — PROTIME-INR
INR: 1.3 — ABNORMAL HIGH (ref 0.9–1.2)
Prothrombin Time: 13 s — ABNORMAL HIGH (ref 9.1–12.0)

## 2020-07-03 MED ORDER — AMLODIPINE BESYLATE 10 MG PO TABS: 10.0000 mg | ORAL_TABLET | Freq: Every day | ORAL | 3 refills | Status: DC

## 2020-07-09 ENCOUNTER — Other Ambulatory Visit: Payer: Self-pay | Admitting: Cardiology

## 2020-07-09 LAB — PROTIME-INR
INR: 1.9 — AB (ref 0.9–1.1)
INR: 19.1 — AB (ref 0.9–1.1)

## 2020-07-10 ENCOUNTER — Ambulatory Visit (INDEPENDENT_AMBULATORY_CARE_PROVIDER_SITE_OTHER): Payer: Medicare HMO | Admitting: Cardiology

## 2020-07-10 DIAGNOSIS — Z7901 Long term (current) use of anticoagulants: Secondary | ICD-10-CM | POA: Diagnosis not present

## 2020-07-10 DIAGNOSIS — Z952 Presence of prosthetic heart valve: Secondary | ICD-10-CM

## 2020-07-10 LAB — PROTIME-INR
INR: 1.9 — ABNORMAL HIGH (ref 0.9–1.2)
Prothrombin Time: 19.1 s — ABNORMAL HIGH (ref 9.1–12.0)

## 2020-07-15 ENCOUNTER — Other Ambulatory Visit: Payer: Self-pay | Admitting: Cardiology

## 2020-07-20 ENCOUNTER — Other Ambulatory Visit: Payer: Self-pay | Admitting: Cardiology

## 2020-07-20 ENCOUNTER — Telehealth: Payer: Self-pay

## 2020-07-20 LAB — POCT INR: INR: 2.7 (ref 2.0–3.0)

## 2020-07-20 NOTE — Telephone Encounter (Signed)
Overdue inr lmom

## 2020-07-21 ENCOUNTER — Ambulatory Visit (INDEPENDENT_AMBULATORY_CARE_PROVIDER_SITE_OTHER): Payer: Medicare HMO | Admitting: Cardiovascular Disease

## 2020-07-21 DIAGNOSIS — Z952 Presence of prosthetic heart valve: Secondary | ICD-10-CM | POA: Diagnosis not present

## 2020-07-21 DIAGNOSIS — Z7901 Long term (current) use of anticoagulants: Secondary | ICD-10-CM

## 2020-07-21 LAB — PROTIME-INR
INR: 2.7 — ABNORMAL HIGH (ref 0.9–1.2)
Prothrombin Time: 27.5 s — ABNORMAL HIGH (ref 9.1–12.0)

## 2020-08-05 ENCOUNTER — Telehealth: Payer: Self-pay

## 2020-08-05 NOTE — Telephone Encounter (Signed)
lmom for overdue inr check

## 2020-08-06 NOTE — Telephone Encounter (Signed)
Pt returned the phone call and stated that they will get inr done on 08/17/20

## 2020-08-17 ENCOUNTER — Other Ambulatory Visit: Payer: Self-pay | Admitting: Cardiology

## 2020-08-17 LAB — POCT INR: INR: 3.4 — AB (ref 2.0–3.0)

## 2020-08-18 ENCOUNTER — Ambulatory Visit (INDEPENDENT_AMBULATORY_CARE_PROVIDER_SITE_OTHER): Payer: Medicare HMO | Admitting: Cardiovascular Disease

## 2020-08-18 DIAGNOSIS — Z7901 Long term (current) use of anticoagulants: Secondary | ICD-10-CM

## 2020-08-18 DIAGNOSIS — Z952 Presence of prosthetic heart valve: Secondary | ICD-10-CM | POA: Diagnosis not present

## 2020-08-18 LAB — PROTIME-INR
INR: 3.4 — ABNORMAL HIGH (ref 0.9–1.2)
Prothrombin Time: 34.1 s — ABNORMAL HIGH (ref 9.1–12.0)

## 2020-09-05 ENCOUNTER — Other Ambulatory Visit: Payer: Self-pay | Admitting: Cardiology

## 2020-09-14 ENCOUNTER — Other Ambulatory Visit: Payer: Self-pay | Admitting: Cardiology

## 2020-09-14 LAB — POCT INR: INR: 2.8 (ref 2.0–3.0)

## 2020-09-15 ENCOUNTER — Ambulatory Visit (INDEPENDENT_AMBULATORY_CARE_PROVIDER_SITE_OTHER): Payer: Medicare HMO | Admitting: Internal Medicine

## 2020-09-15 DIAGNOSIS — Z952 Presence of prosthetic heart valve: Secondary | ICD-10-CM

## 2020-09-15 DIAGNOSIS — Z7901 Long term (current) use of anticoagulants: Secondary | ICD-10-CM

## 2020-09-15 LAB — PROTIME-INR
INR: 2.8 — ABNORMAL HIGH (ref 0.9–1.2)
Prothrombin Time: 28.2 s — ABNORMAL HIGH (ref 9.1–12.0)

## 2020-10-07 ENCOUNTER — Other Ambulatory Visit: Payer: Self-pay | Admitting: Cardiology

## 2020-10-12 ENCOUNTER — Other Ambulatory Visit: Payer: Self-pay | Admitting: Cardiology

## 2020-10-12 LAB — POCT INR: INR: 3.3 — AB (ref 2.0–3.0)

## 2020-10-13 ENCOUNTER — Ambulatory Visit (INDEPENDENT_AMBULATORY_CARE_PROVIDER_SITE_OTHER): Payer: Medicare HMO | Admitting: Internal Medicine

## 2020-10-13 DIAGNOSIS — Z952 Presence of prosthetic heart valve: Secondary | ICD-10-CM | POA: Diagnosis not present

## 2020-10-13 DIAGNOSIS — Z7901 Long term (current) use of anticoagulants: Secondary | ICD-10-CM

## 2020-10-13 LAB — PROTIME-INR
INR: 3.3 — ABNORMAL HIGH (ref 0.9–1.2)
Prothrombin Time: 33.4 s — ABNORMAL HIGH (ref 9.1–12.0)

## 2020-10-16 ENCOUNTER — Telehealth: Payer: Self-pay | Admitting: Cardiology

## 2020-10-16 NOTE — Telephone Encounter (Signed)
Spoke with pt regarding issues with chest pain, pt went to the [facility] ED where they were unable to find a cause for the chest pain.  Able to make appointment with Dr. Ellyn Hack as DOD on Monday. ED precautions given to pt. Pt verbalizes understanding.

## 2020-10-16 NOTE — Telephone Encounter (Signed)
    Pt c/o of Chest Pain: STAT if CP now or developed within 24 hours  1. Are you having CP right now? Yes  2. Are you experiencing any other symptoms (ex. SOB, nausea, vomiting, sweating)? None   3. How long have you been experiencing CP? 2 days  4. Is your CP continuous or coming and going? continuous   5. Have you taken Nitroglycerin? Yes    Pt said he have this constant chest pain for 2 days, he wast at [facility] ER and found nothing and was sent home , he did take 1 nitroglycerine but it didn't work

## 2020-10-19 ENCOUNTER — Other Ambulatory Visit: Payer: Self-pay | Admitting: Cardiology

## 2020-10-19 ENCOUNTER — Encounter: Payer: Self-pay | Admitting: Cardiology

## 2020-10-19 ENCOUNTER — Ambulatory Visit (INDEPENDENT_AMBULATORY_CARE_PROVIDER_SITE_OTHER): Payer: Medicare HMO | Admitting: Cardiology

## 2020-10-19 ENCOUNTER — Other Ambulatory Visit: Payer: Self-pay

## 2020-10-19 VITALS — BP 129/64 | HR 72 | Ht 69.0 in | Wt 207.6 lb

## 2020-10-19 DIAGNOSIS — Z952 Presence of prosthetic heart valve: Secondary | ICD-10-CM | POA: Diagnosis not present

## 2020-10-19 DIAGNOSIS — R0789 Other chest pain: Secondary | ICD-10-CM

## 2020-10-19 DIAGNOSIS — I25709 Atherosclerosis of coronary artery bypass graft(s), unspecified, with unspecified angina pectoris: Secondary | ICD-10-CM | POA: Diagnosis not present

## 2020-10-19 DIAGNOSIS — I25119 Atherosclerotic heart disease of native coronary artery with unspecified angina pectoris: Secondary | ICD-10-CM

## 2020-10-19 DIAGNOSIS — Z7901 Long term (current) use of anticoagulants: Secondary | ICD-10-CM

## 2020-10-19 DIAGNOSIS — Z951 Presence of aortocoronary bypass graft: Secondary | ICD-10-CM

## 2020-10-19 DIAGNOSIS — I5032 Chronic diastolic (congestive) heart failure: Secondary | ICD-10-CM

## 2020-10-19 DIAGNOSIS — E785 Hyperlipidemia, unspecified: Secondary | ICD-10-CM

## 2020-10-19 DIAGNOSIS — I10 Essential (primary) hypertension: Secondary | ICD-10-CM

## 2020-10-19 DIAGNOSIS — I480 Paroxysmal atrial fibrillation: Secondary | ICD-10-CM

## 2020-10-19 NOTE — Patient Instructions (Signed)
Medication Instructions:    Until you  receive result of Lexiscan Myoview - double up on  taking Ranexa =2 tablets twice a day    *If you need a refill on your cardiac medications before your next appointment, please call your pharmacy*   Lab Work:  Not needed   Testing/Procedures:  Will schedule Lexiscan  myoview at [place] has requested that you have a lexiscan myoview. Please follow instruction sheet, as given.   Will schedule at [facility] [address]- July 2022 Your physician has requested that you have an echocardiogram. Echocardiography is a painless test that uses sound waves to create images of your heart. It provides your doctor with information about the size and shape of your heart and how well your heart's chambers and valves are working. This procedure takes approximately one hour. There are no restrictions for this procedure.     Follow-Up: At [facility], you and your health needs are our priority.  As part of our continuing mission to provide you with exceptional heart care, we have created designated Provider Care Teams.  These Care Teams include your primary Cardiologist (physician) and Advanced Practice Providers (APPs -  Physician Assistants and Nurse Practitioners) who all work together to provide you with the care you need, when you need it.     Your next appointment:   3 month(s)  The format for your next appointment:   In Person  Provider:   Glenetta Hew, MD   Other Instructions

## 2020-10-19 NOTE — Progress Notes (Signed)
Primary Care Provider: Ivan Anchors, MD Cardiologist: Glenetta Hew, MD Electrophysiologist: None  Clinic Note: Chief Complaint  Patient presents with  . Hospitalization Follow-up    ER follow-up after chest pain evaluation   . Coronary Artery Disease    Prolonged episode of chest pain lasting several days.  . Atrial Fibrillation    No breakthrough since last cardioversion.     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  Should be due for an echo sometime this year.  Based on the fact that he has regional wall motion on last Myoview we will go ahead and order the echocardiogram along with Myoview.       Relevant Orders   EKG 12-Lead (Completed)   ECHOCARDIOGRAM COMPLETE   Coronary artery disease involving coronary bypass graft of native heart with angina pectoris ([place]) (Chronic)    He has not had no anginal symptoms in some time now.  He did have a prolonged episode of chest pain however which is somewhat concerning.  Based on the fact that he does have existing disease not unreasonable to evaluate.  Plan was to check a Myoview next year.  We will then proceed with [facility].   Plan: Not on aspirin or Plavix because of warfarin. On statin stable dose. He is on Toprol amlodipine and Manola zine for antianginal benefit.  For the next 2 weeks until we get the stress test results I will have him double up his Ranexa to 1000 mg twice daily. Check Lexiscan Myoview.       Relevant Orders   EKG 12-Lead (Completed)   Cardiac Stress Test: Informed Consent Details: Physician/Practitioner Attestation; Transcribe to consent form and obtain patient signature   S/P CABG x 4 (Chronic)   Relevant Orders   EKG 12-Lead (Completed)   Cardiac Stress Test: Informed Consent Details: Physician/Practitioner Attestation; Transcribe to  consent form and obtain patient signature   Paroxysmal-persistent atrial fibrillation ([place]); CHA2DS2-VASc Score 4 - Primary (Chronic)    Maintaining sinus rhythm.  Has PRN amiodarone for breakthrough spells.  Continue rate control with 20 mg Toprol. Anticoagulation with warfarin.  Goal INR 2.5-3.5  We discussed purchasing a home monitor device to assist with identifying A. fib and let us know when to use breakthrough amiodarone.       Relevant Orders   EKG 12-Lead (Completed)   ECHOCARDIOGRAM COMPLETE   MYOCARDIAL PERFUSION IMAGING   Chronic diastolic heart failure (HCC) (Chronic)    Euvolemic on exam.  Rarely takes Lasix more than couple times a week.  On ARB and beta-blocker.       Coronary artery disease involving native coronary artery of native heart with angina pectoris (HCC) (Chronic)    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  Now having atypical chest pain symptoms, but with significant known disease, warrants evaluation.  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  We will need to closely compare current stress test with previous stress test to ensure no new changes.  Continue aggressive management with ARB, beta-blocker, and statin. No aspirin/Plavix because of warfarin.  For antianginal therapy he is on high-dose amlodipine, low-dose Toprol and Ranexa.  -> With prolonged episode of chest discomfort, plan is to check Lexiscan Myoview.   We will increase Ranexa  to 1000 g twice daily until Stress Test results are reviewed.       Dyslipidemia, goal LDL below 70 (Chronic)    Last year, lipids look great.  He should be due to have labs checked soon by PCP. He is on rosuvastatin 20 mg daily.  Anticipate that should be stable.  Await PCP labs.       Hypertension, essential (Chronic)    Blood pressure controlled on current dose of amlodipine, Toprol and losartan.  He  is also on PRN diuretic.  Preferably using PRN Lasix as opposed to thiazide diuretic.       Long term current use of anticoagulant therapy (Chronic)   Chest pain, atypical (Chronic)    He had almost 2 days with chest discomfort not improved with nitroglycerin or Ranexa.  Low likelihood for this being actual cardiac symptoms, however he is close to being due for follow-up stress test evaluation for screening evaluation and post CABG patients.  Plan: Check Lexiscan Myoview.  Increase Ranexa to 1000 g twice daily until stress test read.       Relevant Orders   EKG 12-Lead (Completed)   Cardiac Stress Test: Informed Consent Details: Physician/Practitioner Attestation; Transcribe to consent form and obtain patient signature   ECHOCARDIOGRAM COMPLETE   MYOCARDIAL PERFUSION IMAGING    ===================================  HPI:    BACH ROCCHI is a 80 y.o. male with a PMH notable for MV-CAD/TIA/AI-->CABG x4/Bentall-AVR, PAF, Cardiomyopathy, chronic fatigue who presents today for ER follow-up  MV-CAD-> s/p CABG/AVR/aortoplasty in 0102 complicated by PAF (on warfarin for mechanical valve), cardiomyopathy and chronic fatigue  Cardiac Cath 04/11/2017: Native RCA ~patent to PAV/PL, Native LM-LCx(OM1 & OM2), LAD-D1 & D2 then 100% after SP2). Patent LIMA-dLAD, occluded SeqSVG-RPDA-PL & SVG-D2.  September 2020: GXT with no evidence of chronotropic incompetence. Weaned off of amiodarone, maintained on Toprol 25-> less fatigue with reduced dose Plan was to allow mild permissive hypertension.  He is amiodarone as needed breakthrough A. fib Converted to rosuvastatin Echo showed EF stable at 45 to 50%.  THADD APUZZO was last seen on May 19, 2020 for an ER follow-up visit.  He was having irregular heartbeats skipped beats.  Was noted to be in A. fib and did not convert with amiodarone.  He underwent cardioversion in the ER.  No chest pain associated with this.  A little bit of fatigue with  mild exertional dyspnea but no resting dyspnea or chest pain.  A lot of it was related to stress involving caring for his wife who was having declining health with worsening dementia. Discussed use of amiodarone for breakthrough A. fib.  Recommended monitoring blood pressures at home.  Recent Hospitalizations:  10/16/2020-ER visit for chest pain.  Cherrie Gauze): Describes sharp occasional pressure sensation in his chest lasting for about a day.  No holding did not help.  Began when he woke up the previous day.  Felt similar to angina.  No radiation.  Ruled out with negative troponins.  Chest x-ray and EKG normal.  D-dimer negative.  Mild hyponatremia, treated with IV fluids.  Was offered admission for cardiac evaluation but declined.  Reviewed  CV studies:    The following studies were reviewed today: (if available, images/films reviewed: From Epic Chart or Care Everywhere) None:   Interval History:   FARRIS GEIMAN presents here today in response to his ER visit back on 10 June.  He basically said that the symptoms began on the ninth where he started off having  sharp pain in the upper chest that became several pressure.  At worst it was 6 out of 10 and it was pretty much persistent throughout the day of the ninth into the 10th.  It did not improve with nitroglycerin nor did improve with additional dose of ranolazine.  It was not necessarily made worse with exertion, in fact he was able to do his stationary bike pedaling for 20 minutes and his water aerobics yesterday without difficulty.  But this symptom has persisted is a nagging symptom until yesterday.  Other than his baseline exertional dyspnea that he has related to COPD which he describes as may be getting a little bit winded sooner than he used to, he has not really had other exertional symptoms of chest pain or pressure or dyspnea.  He has not had any breakthrough episodes of A. fib that he can tell.  CV Review of Symptoms  (Summary) Cardiovascular ROS: positive for - chest pain and dyspnea on exertion negative for - edema, irregular heartbeat, orthopnea, palpitations, paroxysmal nocturnal dyspnea, rapid heart rate, shortness of breath, or lightheadedness, dizziness, wooziness; syncope/near syncope or TIA/amaurosis fugax.  Claudication  REVIEWED OF SYSTEMS   Review of Systems  Constitutional:  Positive for malaise/fatigue (A little bit of exercise intolerance.). Negative for weight loss (May be a little).  HENT:  Negative for congestion and nosebleeds.   Respiratory:  Negative for cough and shortness of breath.   Cardiovascular:        Per HPI  Gastrointestinal:  Negative for blood in stool and melena.  Genitourinary:  Negative for hematuria.  Musculoskeletal:  Positive for joint pain. Negative for falls and myalgias.  Neurological:  Negative for dizziness (If he stands up too fast) and focal weakness.  Psychiatric/Behavioral:  Negative for depression and memory loss. The patient does not have insomnia.        In better spirits   I have reviewed and (if needed) personally updated the patient's problem list, medications, allergies, past medical and surgical history, social and family history.   PAST MEDICAL HISTORY   Past Medical History:  Diagnosis Date  . Anemia   . CAD, multiple vessel 12/2002   Found during preop cath for [facility] AVR procedure; Myoview 5/'13: No ischemia or infarct, EF 53% --> cath December 2018: Proximal LAD 65% followed by mid LAD 65% then 100% occlusion.  Patent LIMA-LAD.  SVG-D2 occluded.  RPDA occluded. Seq SVG-r PDA-PL patent to PDA, but the PDA is occluded as is the proximal native PDA  . COPD (chronic obstructive pulmonary disease) ([place])   . Diverticulosis   . Dyslipidemia, goal LDL below 70    Monitored by PCP. On statin (last labs scan from September 2015: TC 149, TG 127, HDL 55, LDL 69)  . ED (erectile dysfunction)   . GERD (gastroesophageal reflux disease)   . History of  SBO (small bowel obstruction) March 2015   Abdominal surgery with LOA  . Hx of irritable bowel syndrome   . Hypertension, essential   . Lesion of right native kidney  08/20/2013   Abdominal US : 2.4 cm x 1.7 complex lesion in the upper pole the right kidney in March of 2012 concerning for RCC although enlarging hemorrhagic cyst could have this appearance as well.,   . Monoclonal gammopathy of undetermined significance   . Paroxysmal-persistent atrial fibrillation ([place]); CHA2DS2-VASc Score 7 03/23/2017   CHA2DS2-VASc (CHF, HTN, CAD, agex2, TIAx2 = 7)-> Most recent episode January 3-5, 2022; unsuccessful cardioversion with amiodarone; ->  went to ER-synchronized DCCV x1  . Psoriasis   . 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 Cyndia Bent: Deneen Harts procedure - St. Jude aVR (25 mm prosthesis) with aortic root conduit;; Echo 10/'14: EF 55-60%, Mod Conc LVH, Gr 1 DD, Well Seated AoV Mech Prosthesis with normal P gradients (no stenosis), Mod-Severe LA dilation --> follow-up echo November 2016: Well functioning mechanical valve. Paradoxical septal motion. EF 50-55%. Severe LA dilation. Moderate RA dilation.   . 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   LIMA-LAD, SVG-D1, SVG- PDA-PLA (along with AVR)    PAST SURGICAL HISTORY   Past Surgical History:  Procedure Laterality Date  . AORTIC VALVE REPLACEMENT  12/26/2002   CG mechanical mechanical AVR as part of Bentall procedure  . BENTALL PROCEDURE  12/26/2002   Aortic valve replacement and replacement of aortic root aneurysm using a 25 mm St. JUDE mechanical valve conduit with reimplantation of the coronary arteries  . CARDIAC CATHETERIZATION  Aug  13,2004   diffuse  coronary disease,prior to his BENTALL PROCEDURE  . CORONARY ARTERY BYPASS GRAFT  12/26/2002   LIMA to LAD,SVG to diagonal, SVG to PDA and PLA.   Marland Kitchen CORONARY/GRAFT ANGIOGRAPHY N/A 04/21/2017   Procedure: CORONARY/GRAFT ANGIOGRAPHY;  Surgeon: Leonie Man, MD;  Location: [facility] INVASIVE CV  LAB: p & mLAD 65% then m100%. Patent LIMA-LAD. 100% SVG-D2 (D1 & D2 fill via native flow. RCA-RPAV patent, 100% ostrPDA, but SVG-rPDA-RPL patent to PDA then occluded.  Marland Kitchen EXERCISE TOLERANCE TEST  09/12/2017    5 METS Baseline ST depression.  No new changes with exercise.  Heart rate went to 141 bpm.  Poor exercise tolerance reaching target heart rate at 2:26-minute,.  No chronotropic Incompetence  . LAPAROSCOPIC LYSIS OF ADHESIONS  March 2015   performed in [place] for SBO   . NM MYOVIEW LTD  10/04/2011   09/2011: showed no ischemia;EF52%; 03/2017 - [facility]) [place].  Mild LV dilation.  EF 45 to 50%..  Concern for prior infarct in the inferior lateral wall and apical anterior wall consistent with infarct with peri-infarct ischemia. => On review, these findings correlate with cardiac catheter showing inferior infarct and anterior infarct.  Marland Kitchen PARTIAL COLECTOMY  1994  . THORACIC AORTIC ANEURYSM REPAIR  2004   Saint Jude aortic valve conduit-Bentall AVR  . TRANSTHORACIC ECHOCARDIOGRAM  02/2017   [facility]) technically difficult study.  Mild LVH.  Mildly reduced EF of 45-50%.  1+ MR and TR.  Severe LA dilation.  Moderate RV dilation.  Unable to assess diastolic function.  RVP 42 mmHg suggesting mild-moderate pulmonary hypertension.  Mechanical Saint Jude Aortic Valve Prosthesis not well visualized, but gradient 5 mmHg.  Marland Kitchen TRANSTHORACIC ECHOCARDIOGRAM  11/2018    Stable EF 45-50%-moderate HK of apical-anteroseptal wall..  Moderate asymmetric LVH with GR 1 DD.  Normal RV.  RA mildly dilated.  Moderate MAC.  Marland Kitchen VENTRAL HERNIA REPAIR     repair with mesh by Dr Collene Mares at [facility]     Cardiac Cath 04/11/2017: Native RCA ~patent to PAV/PL, Native LM-LCx(OM1 & OM2), LAD-D1 & D2 then 100% after SP2). Patent LIMA-dLAD, occluded SeqSVG-RPDA-PL & SVG-D2.  Findings correlated well with the stress test showing inferior infarct as well as anterior infarct.  Continue medical  management    There is no immunization history on file for this patient.  MEDICATIONS/ALLERGIES   Current Meds  Medication Sig  . amiodarone (PACERONE) 200 MG tablet Take as directed  . amLODipine (NORVASC) 10 MG  tablet Take 1 tablet (10 mg total) by mouth daily.  Marland Kitchen ampicillin (PRINCIPEN) 500 MG capsule Take 500 mg by mouth daily.   . DENTA 5000 PLUS 1.1 % CREA dental cream Place 1 Squirt onto teeth daily.  . Esomeprazole Magnesium (NEXIUM PO) Take by mouth.  . ferrous sulfate 325 (65 FE) MG tablet Take 1 tablet by mouth 2 (two) times daily.  . furosemide (LASIX) 20 MG tablet TAKE 1 TO 2 TABLETS EVERY DAY IF NEEDED  . LORazepam (ATIVAN) 0.5 MG tablet Take 0.5 mg by mouth daily.  . metoprolol succinate (TOPROL-XL) 25 MG 24 hr tablet TAKE 1 TABLET EVERY DAY  . nitroGLYCERIN (NITROSTAT) 0.4 MG SL tablet Place 1 tablet (0.4 mg total) under the tongue every 5 (five) minutes as needed for chest pain.  Marland Kitchen omeprazole (PRILOSEC OTC) 20 MG tablet Take 20 mg by mouth daily.  . potassium chloride SA (KLOR-CON) 20 MEQ tablet TAKE 1 TABLET TWICE DAILY  . rosuvastatin (CRESTOR) 20 MG tablet TAKE 1 TABLET EVERY DAY  . tamsulosin (FLOMAX) 0.4 MG CAPS capsule Take 0.4 mg by mouth 2 (two) times daily.   . Tiotropium Bromide-Olodaterol (STIOLTO RESPIMAT) 2.5-2.5 MCG/ACT AERS Inhale into the lungs daily.  . valsartan (DIOVAN) 320 MG tablet TAKE 1 TABLET EVERY DAY  . warfarin (COUMADIN) 5 MG tablet TAKE 1 AND 1/2 TABLETS TO 2 TABLETS DAILY AS DIRECTED BY COUMADIN CLINIC  . zonisamide (ZONEGRAN) 50 MG capsule 2 cap(s)  . [DISCONTINUED] enoxaparin (LOVENOX) 100 MG/ML injection Inject 1 mL (100 mg total) into the skin every 12 (twelve) hours.  . [DISCONTINUED] ranolazine (RANEXA) 500 MG 12 hr tablet TAKE 1 TABLET TWICE DAILY   This patients CHA2DS2-VASc Score and unadjusted Ischemic Stroke Rate (% per year) is equal to 11.2 % stroke rate/year from a score of 7  Above score calculated as 1 point each if present  [CHF, HTN, DM, Vascular=MI/PAD/Aortic Plaque, Age if 65-74, or Male];  2 points each if present [Age > 75, or Stroke/TIA/TE]    Allergies  Allergen Reactions  . Doxycycline Rash    SOCIAL HISTORY/FAMILY HISTORY   Reviewed in Epic:  Pertinent findings:  Social History   Tobacco Use  . Smoking status: Former    Pack years: 0.00    Types: Cigarettes    Quit date: 06/16/1997    Years since quitting: 23.3  . Smokeless tobacco: Never  Vaping Use  . Vaping Use: Never used  Substance Use Topics  . Alcohol use: Yes  . Drug use: No   Social History   Social History Narrative   Married gentleman with no children. Quit smoking in 1998.   Education: 2 years of college. He does drink caffeine.  He drinks social beer.   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 Claiborne Billings in [place]; GI: Dr. Janus Molder in [place]      **November 2020: He notes increased stress family to care for his wife who is showing signs of worsening dementia.  Hard to adjust to this and is somewhat depressing.    -He is try to work on cutting back on his intake of salt specific    OBJCTIVE -PE, EKG, labs   Wt Readings from Last 3 Encounters:  10/19/20 207 lb 9.6 oz (94.2 kg)  05/19/20 210 lb (95.3 kg)  05/13/20 212 lb (96.2 kg)    Physical Exam: BP 129/64   Pulse 72   Ht _0  (1.753 m)   Wt 207 lb 9.6 oz (94.2 kg)   SpO2 98%   BMI 30.66 kg/m  Physical Exam Vitals reviewed.  Constitutional:      General: He is not in acute distress.    Appearance: Normal appearance. He is obese. He is not ill-appearing or diaphoretic.     Comments: Well-groomed.  HENT:     Head: Normocephalic and atraumatic.  Neck:     Vascular: No carotid bruit, hepatojugular reflux or JVD.  Cardiovascular:     Rate and Rhythm: Normal rate and regular rhythm. No  extrasystoles are present.    Chest Wall: PMI is not displaced.     Pulses: Normal pulses.     Heart sounds: S1 normal and S2 normal. Heart sounds are distant. Murmur (2/6 SEM at RUSB.) heard.    No friction rub.  Musculoskeletal:     Cervical back: Normal range of motion and neck supple.  Neurological:     Mental Status: He is alert.     Adult ECG Report  Rate: 72 ;  Rhythm: normal sinus rhythm and 1  AVB.  Low voltage.  Cannot exclude septal MI, age-indeterminate. ;   Narrative Interpretation: Stable  Recent Labs:    [place] Related to CBC And Differential Component 10/16/20  07/09/20   WBC 2.3 Low  2.4 Low   RBC 3.66 Low  3.69 Low   HGB 12.6 Low  12.6 Low   HCT 34.5 Low  37.0 Low   MCV 94 100 High   MCH 34.4 High  34.1 High   MCHC 36.5 High  34.1  Plt Ct 119 Low  134 Low    Comprehensive Metabolic Panel Component 95/18/84  07/09/20   Na 125 Low  --  Potassium 3.9 4.1  Cl 90 Low  --  CO2 24 23  AGAP 11 --  Glucose 147 High  95  BUN 13 12  Creatinine 0.93 0.93  Ca 9.2 --  ALK PHOS 70 --  T Bili 0.66 --  Total Protein 6.5 6  Alb 4.4 --  BUN/CREAT RATIO 14.0 --  ALT 14 --  AST 14 16     Lab Results  Component Value Date   CHOL 152 09/19/2019   HDL 76 09/19/2019   LDLCALC 61 09/19/2019   TRIG 80 09/19/2019   CHOLHDL 2.0 09/19/2019   Lab Results  Component Value Date   CREATININE 0.95 05/13/2020   BUN 11 05/13/2020   NA 129 (L) 05/13/2020   K 4.2 05/13/2020   CL 95 (L) 05/13/2020   CO2 22 05/13/2020   CBC Latest Ref Rng & Units 05/13/2020 04/21/2017 03/04/2017  WBC 4.0 - 10.5 K/uL 2.2(L) 2.2(L) 9.1  Hemoglobin 13.0 - 17.0 g/dL 12.3(L) 12.4(L) 12.9(L)  Hematocrit 39.0 - 52.0 % 33.5(L) 36.0(L) 37.4(L)  Platelets 150 - 400 K/uL 154 110(L) 176    No results found for: TSH  ==================================================  COVID-19 Education: The signs and symptoms of COVID-19 were discussed with the patient and how to seek care for testing  (follow up with PCP or arrange E-visit).    I spent a total of 50mnutes with the patient spent in direct patient consultation.  Additional time spent with chart review  / charting (studies, outside notes, etc): 12 min Total Time: 47 min  Current medicines are reviewed at length with the patient today.  (+/-  concerns) n/a  This visit occurred during the SARS-CoV-2 public health emergency.  Safety protocols were in place, including screening questions prior to the visit, additional usage of staff PPE, and extensive cleaning of exam room while observing appropriate contact time as indicated for disinfecting solutions.  Notice: This dictation was prepared with Dragon dictation along with smaller phrase technology. Any transcriptional errors that result from this process are unintentional and may not be corrected upon review.  Patient Instructions / Medication Changes & Studies & Tests Ordered   Shared Decision Making/Informed Consent{  The risks [chest pain, shortness of breath, cardiac arrhythmias, dizziness, blood pressure fluctuations, myocardial infarction, stroke/transient ischemic attack, nausea, vomiting, allergic reaction, radiation exposure, metallic taste sensation and life-threatening complications (estimated to be 1 in 10,000)], benefits (risk stratification, diagnosing coronary artery disease, treatment guidance) and alternatives of a nuclear stress test were discussed in detail with Mr. Glace and he agrees to proceed.   Patient Instructions  Medication Instructions:    Until you  receive result of Lexiscan Myoview - double up on  taking Ranexa =2 tablets twice a day    *If you need a refill on your cardiac medications before your next appointment, please call your pharmacy*   Lab Work:  Not needed   Testing/Procedures:  Will schedule Lexiscan  myoview at [facility] has requested that you have a lexiscan myoview. Please follow  instruction sheet, as given.   Will schedule at [facility] [address]- July 2022 Your physician has requested that you have an echocardiogram. Echocardiography is a painless test that uses sound waves to create images of your heart. It provides your doctor with information about the size and shape of your heart and how well your heart's chambers and valves are working. This procedure takes approximately one hour. There are no restrictions for this procedure.     Follow-Up: At [facility], you and your health needs are our priority.  As part of our continuing mission to provide you with exceptional heart care, we have created designated Provider Care Teams.  These Care Teams include your primary Cardiologist (physician) and Advanced Practice Providers (APPs -  Physician Assistants and Nurse Practitioners) who all work together to provide you with the care you need, when you need it.     Your next appointment:   3 month(s)  The format for your next appointment:   In Person  Provider:   Glenetta Hew, MD   Other Instructions    Studies Ordered:   Orders Placed This Encounter  Procedures  . Cardiac Stress Test: Informed Consent Details: Physician/Practitioner Attestation; Transcribe to consent form and obtain patient signature  . MYOCARDIAL PERFUSION IMAGING  . EKG 12-Lead  . ECHOCARDIOGRAM COMPLETE     Glenetta Hew, M.D., M.S. Interventional Cardiologist   Pager # [phone] Phone # [phone] [address] [place], [place] [address]   Thank you for choosing Heart[facility] at [facility]!!

## 2020-10-20 ENCOUNTER — Encounter: Payer: Self-pay | Admitting: Cardiology

## 2020-10-20 DIAGNOSIS — I25119 Atherosclerotic heart disease of native coronary artery with unspecified angina pectoris: Secondary | ICD-10-CM | POA: Insufficient documentation

## 2020-10-20 NOTE — Assessment & Plan Note (Signed)
He has not had no anginal symptoms in some time now.  He did have a prolonged episode of chest pain however which is somewhat concerning.  Based on the fact that he does have existing disease not unreasonable to evaluate.  Plan was to check a Myoview next year.  We will then proceed with [facility].   Plan:  Not on aspirin or Plavix because of warfarin.  On statin stable dose.  He is on Toprol amlodipine and Manola zine for antianginal benefit.  For the next 2 weeks until we get the stress test results I will have him double up his Ranexa to 1000 mg twice daily.  Check Lexiscan Myoview.

## 2020-10-20 NOTE — Assessment & Plan Note (Signed)
Euvolemic on exam.  Rarely takes Lasix more than couple times a week.  On ARB and beta-blocker.

## 2020-10-20 NOTE — Assessment & Plan Note (Signed)
Last year, lipids look great.  He should be due to have labs checked soon by PCP. He is on rosuvastatin 20 mg daily.  Anticipate that should be stable.  Await PCP labs.

## 2020-10-20 NOTE — Assessment & Plan Note (Addendum)
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  Now having atypical chest pain symptoms, but with significant known disease, warrants evaluation.  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  We will need to closely compare current stress test with previous stress test to ensure no new changes.  Continue aggressive management with ARB, beta-blocker, and statin. No aspirin/Plavix because of warfarin.  For antianginal therapy he is on high-dose amlodipine, low-dose Toprol and Ranexa.  -> With prolonged episode of chest discomfort, plan is to check Lexiscan Myoview.    We will increase Ranexa to 1000 g twice daily until Stress Test results are reviewed.

## 2020-10-20 NOTE — Assessment & Plan Note (Signed)
Blood pressure controlled on current dose of amlodipine, Toprol and losartan.  He is also on PRN diuretic.  Preferably using PRN Lasix as opposed to thiazide diuretic.

## 2020-10-20 NOTE — Assessment & Plan Note (Signed)
He had almost 2 days with chest discomfort not improved with nitroglycerin or Ranexa.  Low likelihood for this being actual cardiac symptoms, however he is close to being due for follow-up stress test evaluation for screening evaluation and post CABG patients.  Plan: Check Lexiscan Myoview.  Increase Ranexa to 1000 g twice daily until stress test read.

## 2020-10-20 NOTE — Assessment & Plan Note (Addendum)
Maintaining sinus rhythm.  Has PRN amiodarone for breakthrough spells.  Continue rate control with 20 mg Toprol. Anticoagulation with warfarin.  Goal INR 2.5-3.5  We discussed purchasing a home monitor device to assist with identifying A. fib and let us know when to use breakthrough amiodarone.

## 2020-10-20 NOTE — Assessment & Plan Note (Signed)
Last echo was in 2020.  Should be due for an echo sometime this year.  Based on the fact that he has regional wall motion on last Myoview we will go ahead and order the echocardiogram along with Myoview.

## 2020-10-21 NOTE — Telephone Encounter (Signed)
Patient  was seen 10/19/20 . Lexiscan was schedule

## 2020-10-23 ENCOUNTER — Telehealth (HOSPITAL_COMMUNITY): Payer: Self-pay

## 2020-10-23 NOTE — Telephone Encounter (Signed)
Close encounter 

## 2020-10-28 ENCOUNTER — Ambulatory Visit (HOSPITAL_COMMUNITY)
Admission: RE | Admit: 2020-10-28 | Discharge: 2020-10-28 | Disposition: A | Discharge status: Home / Self Care | Payer: Medicare HMO | Source: Ambulatory Visit | Attending: Cardiovascular Disease | Admitting: Cardiovascular Disease

## 2020-10-28 ENCOUNTER — Other Ambulatory Visit: Payer: Self-pay

## 2020-10-28 DIAGNOSIS — I480 Paroxysmal atrial fibrillation: Secondary | ICD-10-CM | POA: Insufficient documentation

## 2020-10-28 DIAGNOSIS — R0789 Other chest pain: Secondary | ICD-10-CM | POA: Insufficient documentation

## 2020-10-28 HISTORY — PX: NM MYOVIEW LTD: HXRAD82

## 2020-10-28 LAB — MYOCARDIAL PERFUSION IMAGING
LV dias vol: 131 mL (ref 62–150)
LV sys vol: 68 mL
Peak HR: 75 {beats}/min
Rest HR: 67 {beats}/min
SDS: 4
SRS: 12
SSS: 16
TID: 1.1

## 2020-10-28 MED ORDER — TECHNETIUM TC 99M TETROFOSMIN IV KIT
11.0000 | PACK | Freq: Once | INTRAVENOUS | Status: AC | PRN
  Administered 2020-10-28: 11 via INTRAVENOUS
  Filled 2020-10-28: qty 11

## 2020-10-28 MED ORDER — TECHNETIUM TC 99M TETROFOSMIN IV KIT
32.4000 | PACK | Freq: Once | INTRAVENOUS | Status: AC | PRN
  Administered 2020-10-28: 32.4 via INTRAVENOUS
  Filled 2020-10-28: qty 33

## 2020-10-28 MED ORDER — REGADENOSON 0.4 MG/5ML IV SOLN
0.4000 mg | Freq: Once | INTRAVENOUS | Status: AC
  Administered 2020-10-28: 0.4 mg via INTRAVENOUS

## 2020-11-02 NOTE — Telephone Encounter (Signed)
Pt state she read Dr. Allison Quarry recommendations and wondering what could be accomplished by doing another cath. Pt also report he is experiencing chest pain 2-3 times a week the feels like a stabbing pain. Pt report symptoms are more so when laying down and sometimes can last all day. Pt denies symptoms currently.  Pt also report he is currently taking 1000 mg of Ranexa and feels its helping symptoms a little more. He state he would like to stay at that current dose.  Will forward to MD

## 2020-11-03 ENCOUNTER — Telehealth: Payer: Self-pay | Admitting: *Deleted

## 2020-11-03 NOTE — Telephone Encounter (Signed)
Left message for patient to return call,would like to provide a follow up appointment concerning  test results

## 2020-11-03 NOTE — Telephone Encounter (Signed)
-----   Message from Leonie Man, MD sent at 10/30/2020  6:32 PM EDT ----- Stress test results are a little equivocal.  For the most part there are 2 areas of prior heart attack they were previously noted back in 2018.  There is suggestion that the front wall of the heart has some ischemia still not distribution.  This is pretty similar to what was found back in 2018 when we did a heart catheterization.  I do not really see too much difference.  Need to reassess symptoms and see just how he is feeling.  If he is having progressively worsening symptoms, then perhaps we would need different seen with invasive evaluation by heart catheterization, otherwise I think we can try to treat medically and monitor for recurrence.  Unfortunately with 2 old heart attacks on stress test, it is difficult to tell what is New and what is old.  Glenetta Hew, MD

## 2020-11-03 NOTE — Telephone Encounter (Signed)
Pt is returning call in regards to results. Please advise pt further

## 2020-11-03 NOTE — Telephone Encounter (Signed)
Spoke to patient .  Offered the next virtual appointment after echo is completed. Patient declined  patient states he prefer an in person visit . Appointment schedule for Dec 07, 2020 at 11:30 am . Patient verbalized understanding.   Patient states no changes since office visit . Still taking 2 tablets  ( total of 100 mg) of Ranexa  twice a day . Patient sates he has enough medication until next appointment.

## 2020-11-05 ENCOUNTER — Emergency Department (HOSPITAL_COMMUNITY): Payer: Medicare HMO

## 2020-11-05 ENCOUNTER — Emergency Department (HOSPITAL_COMMUNITY)
Admission: EM | Admit: 2020-11-05 | Discharge: 2020-11-05 | Disposition: A | Discharge status: Home / Self Care | Payer: Medicare HMO | Attending: Emergency Medicine | Admitting: Emergency Medicine

## 2020-11-05 ENCOUNTER — Other Ambulatory Visit: Payer: Self-pay

## 2020-11-05 ENCOUNTER — Telehealth: Payer: Self-pay | Admitting: Physician Assistant

## 2020-11-05 DIAGNOSIS — Z5321 Procedure and treatment not carried out due to patient leaving prior to being seen by health care provider: Secondary | ICD-10-CM | POA: Diagnosis not present

## 2020-11-05 DIAGNOSIS — R079 Chest pain, unspecified: Secondary | ICD-10-CM | POA: Insufficient documentation

## 2020-11-05 LAB — BASIC METABOLIC PANEL
Anion gap: 11 (ref 5–15)
BUN: 15 mg/dL (ref 8–23)
CO2: 25 mmol/L (ref 22–32)
Calcium: 8.8 mg/dL — ABNORMAL LOW (ref 8.9–10.3)
Chloride: 92 mmol/L — ABNORMAL LOW (ref 98–111)
Creatinine, Ser: 0.98 mg/dL (ref 0.61–1.24)
GFR, Estimated: >60 mL/min (ref >60)
Glucose, Bld: 112 mg/dL — ABNORMAL HIGH (ref 70–99)
Potassium: 4.2 mmol/L (ref 3.5–5.1)
Sodium: 128 mmol/L — ABNORMAL LOW (ref 135–145)

## 2020-11-05 LAB — CBC
HCT: 36.8 % — ABNORMAL LOW (ref 39.0–52.0)
Hemoglobin: 12.9 g/dL — ABNORMAL LOW (ref 13.0–17.0)
MCH: 34.4 pg — ABNORMAL HIGH (ref 26.0–34.0)
MCHC: 35.1 g/dL (ref 30.0–36.0)
MCV: 98.1 fL (ref 80.0–100.0)
Platelets: 121 10*3/uL — ABNORMAL LOW (ref 150–400)
RBC: 3.75 MIL/uL — ABNORMAL LOW (ref 4.22–5.81)
RDW: 14 % (ref 11.5–15.5)
WBC: 2 10*3/uL — ABNORMAL LOW (ref 4.0–10.5)
nRBC: 0 % (ref 0.0–0.2)

## 2020-11-05 LAB — DIFFERENTIAL
Abs Immature Granulocytes: 0 10*3/uL (ref 0.00–0.07)
Basophils Absolute: 0 10*3/uL (ref 0.0–0.1)
Basophils Relative: 0 %
Eosinophils Absolute: 0.1 10*3/uL (ref 0.0–0.5)
Eosinophils Relative: 3 %
Lymphocytes Relative: 25 %
Lymphs Abs: 0.5 10*3/uL — ABNORMAL LOW (ref 0.7–4.0)
Monocytes Absolute: 0.4 10*3/uL (ref 0.1–1.0)
Monocytes Relative: 21 %
Neutro Abs: 1 10*3/uL — ABNORMAL LOW (ref 1.7–7.7)
Neutrophils Relative %: 51 %
nRBC: 0 /100 WBC

## 2020-11-05 LAB — HEPATIC FUNCTION PANEL
ALT: 17 U/L (ref 0–44)
AST: 19 U/L (ref 15–41)
Albumin: 3.9 g/dL (ref 3.5–5.0)
Alkaline Phosphatase: 59 U/L (ref 38–126)
Bilirubin, Direct: 0.2 mg/dL (ref 0.0–0.2)
Indirect Bilirubin: 0.6 mg/dL (ref 0.3–0.9)
Total Bilirubin: 0.8 mg/dL (ref 0.3–1.2)
Total Protein: 6.3 g/dL — ABNORMAL LOW (ref 6.5–8.1)

## 2020-11-05 LAB — TROPONIN I (HIGH SENSITIVITY)
Troponin I (High Sensitivity): 4 ng/L (ref <18)
Troponin I (High Sensitivity): 4 ng/L (ref <18)

## 2020-11-05 LAB — PROTIME-INR
INR: 3.7 — ABNORMAL HIGH (ref 0.8–1.2)
Prothrombin Time: 36.3 seconds — ABNORMAL HIGH (ref 11.4–15.2)

## 2020-11-05 NOTE — ED Provider Notes (Signed)
Emergency Medicine Provider Triage Evaluation Note  Erik Meyer , a 80 y.o. male  was evaluated in triage.  Pt complains of chest pain.  Chest pain started 0 500 this morning, pain progressively worsened over time.  At its worst patient rated pain 10/10 on the pain scale.  Patient describes it as a sharp stabbing sensation to the midsternal chest.  No radiation of pain.  No associated shortness of breath, nausea, vomiting.  Patient reports that moving makes the pain feel slightly better.  Patient had no relief of symptoms with nitroglycerin at 1300.  At present patient reports that his pain has improved.  Patient currently rates pain 1/10 on the pain scale.  No leg swelling or palpitations.  Patient reports that he had a recent stress test which showed some changes.  States that his cardiologist Dr. Ellyn Hack is planning a left heart catheterization in the near future.  Patient has history of mechanical valve  Review of Systems  Positive: Chest pain Negative: Shortness of breath, palpitations, leg swelling, nausea, vomiting, abdominal pain  Physical Exam  BP (!) 156/76 (BP Location: Left Arm)   Pulse 83   Temp 98.8 F (37.1 C) (Oral)   Resp 18   SpO2 100%  Gen:   Awake, no distress   Resp:  Normal effort, speaks in full sentences without difficulty, lungs clear to auscultation bilaterally. MSK:   Moves extremities without difficulty  Other:  Patient has murmur  Medical Decision Making  Medically screening exam initiated at 4:21 PM.  Appropriate orders placed.  Erik Meyer was informed that the remainder of the evaluation will be completed by another provider, this initial triage assessment does not replace that evaluation, and the importance of remaining in the ED until their evaluation is complete.  The patient appears stable so that the remainder of the work up may be completed by another provider.      Loni Beckwith, PA-C 11/05/20 [place], [place], DO 11/05/20  [id number]

## 2020-11-05 NOTE — ED Triage Notes (Signed)
Pt denies pressure, denies NV, SHOB.

## 2020-11-05 NOTE — Telephone Encounter (Signed)
   Pt called because he is having CP. It has been going on all day, but may have started yesterday.   It seems to get better w/ ambulation. Nitro made no change.  However, the pain is now up to 7/10, unusual for him to have pain at this level.  He is compliant with meds, INR is usually about 3.   Reviewed MV, results below.  Advised him that he should go to the ER, should NOT drive. Asked him to call 911.  He will come to the ER.   MYOCARDIAL PERFUSION IMAGING  Result Date: 10/28/2020  The left ventricular ejection fraction is mildly decreased (45-54%).  Nuclear stress EF: 48%.  There was no ST segment deviation noted during stress.  No T wave inversion was noted during stress.  Defect 1: There is a medium defect of severe severity present in the basal inferolateral and mid inferolateral location.  Defect 2: There is a medium defect of moderate severity present in the mid anterior, apical anterior and apex location.  Findings consistent with prior myocardial infarction with peri-infarct ischemia.  This is an intermediate risk study.  Intermediate risk stress test, with EF mildly reduced and wall motion abnormalities as noted. There are two defects that are similar to prior; the inferolateral defect appears fixed and consistent with scar, and the anterior defect is consistent with infarct with peri-infarct ischemia.    Rosaria Ferries, PA-C 11/05/2020 2:50 PM

## 2020-11-05 NOTE — ED Notes (Signed)
No reply for room x5. Checked outside- pt not found.

## 2020-11-05 NOTE — ED Triage Notes (Signed)
Pt c/o CP x2 days, states it was worse this AM, but that CP resolved en route to ED. Hx of same, compliant w medication. 8/10 sharp pain when occurring.

## 2020-11-06 ENCOUNTER — Telehealth: Payer: Self-pay | Admitting: Cardiology

## 2020-11-06 NOTE — Telephone Encounter (Signed)
RN spoke to Dr Ellyn Hack . Per Dr Ellyn Hack ,if patient is in agreement a cardiac cath can be schedule for next week  and Dr Ellyn Hack can call later today to discuss procedure, or if patient not having active chest pain can come this afternoon.  RN called a spoke to patient. Patient states he is having chest pain and he is on his way to [facility] ER. RN  informed patient continue to proceed to ER. If patient is release - patient can still be set up forprocedure next week. Patient verbalized understanding.   Dr Ellyn Hack aware patient is in route to ER.

## 2020-11-06 NOTE — Telephone Encounter (Signed)
Patient is requesting to speak with Dr. Allison Quarry nurse for advisement regarding the message below. He states he went to the ED as advised, but he was never able to see anyone. He states he arrived around 3:00 PM and left around 9:45 PM. He states around 8:00-9:00 PM he was told that he "fell out of the system" and would have to start all over.

## 2020-11-06 NOTE — Telephone Encounter (Signed)
   Pt is calling to speak with Dr. Ellyn Hack nurse to schedule his heart procedure as soon as possible

## 2020-11-06 NOTE — Telephone Encounter (Signed)
Routed to [employer] as I cannot locate notes where it said to schedule a procedure

## 2020-11-06 NOTE — Telephone Encounter (Signed)
Called patient, he states that he went to the ED- waited and waited, but never seen a doctor. After 6 hours he was told that he was lost in the system and would have to start over in the waiting, patient decided to leave states his CP was not that bad anymore. He would just like to know Dr.Harding's opinion on doing a heart cath because he has had these pains on and off for a while now and he would like to know what is going on. Patient states that the pain today is a 1, and just feels tight, he denies SOB, and no new swelling at this time. The last time he took a nitro was yesterday afternoon and it did not help, so he called in spoke to on call PA who advised ED visit, patient states that he had blood work done- was not advised of results.   He states his BP is normally good 120-130's no exact number, other than ED visit information.  Advised of ED precautions again, and would route to MD to advise further.

## 2020-11-06 NOTE — Telephone Encounter (Addendum)
Based on the patient's persistent ongoing chest pain episodes, with an abnormal nuclear stress test, her best course of action would be to proceed with cardiac catheterization.  He is already on maximal therapeutic management with 10 mg amlodipine, Toprol as max tolerated based on heart rates along with Ranexa 1000 mg twice daily.  He was not able to tolerate nitrates.  The recommendation was for him to go to the ER today.  He said that in route to the ER his chest pain subsided and therefore he chose to return home.  I called him this evening 11/06/2020 at [facility] and we discussed options of care.  He agrees that he would like to proceed with cardiac catheterization and we therefore decided to schedule him for Thursday, July 7 which would allow for him to hold his warfarin for the procedure.  We will need to call in the prescription for Lovenox bridging.   Shared Decision Making/Informed Consent{ The risks [stroke (1 in 1000), death (1 in 1000), kidney failure [usually temporary] (1 in 500), bleeding (1 in 200), allergic reaction [possibly serious] (1 in 200)], benefits (diagnostic support and management of coronary artery disease) and alternatives of a cardiac catheterization were discussed in detail with Erik Meyer and he is willing to proceed.   I informed him that if he were to have further prolonged episodes of chest pain in the ensuing days leading up to Thursday, that he should go to the ER.  If he is admitted, I am in the Cath Lab Tuesday morning and went all day Wednesday if necessary to do his procedure sooner.  This would however mean that he would not of been off of his warfarin for as long, but would at least have been held from today.      Glenetta Hew, MD

## 2020-11-06 NOTE — Telephone Encounter (Signed)
RN called patient and went over instruction for cardiac cath. Sent instruction via mycahrt as well.  DR Ellyn Hack called and spoke to patient prior to RN  giving instructions  Arrangement were made for pharmacist for [facility] to contact patient on July 2, via phone to tgive instruction concerning warfarin and Lovenox  Patient voiced understanding.

## 2020-11-07 ENCOUNTER — Telehealth: Payer: Self-pay | Admitting: Pharmacist Clinician (PhC)/ Clinical Pharmacy Specialist

## 2020-11-07 ENCOUNTER — Encounter: Payer: Self-pay | Admitting: Pharmacist Clinician (PhC)/ Clinical Pharmacy Specialist

## 2020-11-07 MED ORDER — ENOXAPARIN SODIUM 100 MG/ML IJ SOSY: 100.0000 mg | PREFILLED_SYRINGE | Freq: Two times a day (BID) | INTRAMUSCULAR | 0 refills | Status: DC

## 2020-11-07 NOTE — Telephone Encounter (Signed)
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  Will need to hold warfarin x 5 days with lovenox bridge.    7/1: Last dose of warfarin.   7/2: No warfarin or enoxaparin (Lovenox).   7/3: Inject enoxaparin 100mg  in the fatty abdominal tissue at least 2 inches from the belly button twice a day about 12 hours apart, 8am and 8pm rotate sites. No warfarin.   7/4: Inject enoxaparin in the fatty tissue every 12 hours, 8am and 8pm. No warfarin.   7/5: Inject enoxaparin in the fatty tissue every 12 hours, 8am and 8pm. No warfarin.   7/6 : Inject enoxaparin in the fatty tissue in the morning at 8 am (No PM dose). No warfarin.   7/7 : Procedure Day - No enoxaparin - Resume warfarin in the evening - take 10 mg (2 tablets)   7/8: Resume enoxaparin inject in the fatty tissue every 12 hours and take warfarin 12.5 mg (2.5 tablets)   7/9: Inject enoxaparin in the fatty tissue every 12 hours and take warfarin 10 mg (2 tablets)   7/10: Inject enoxaparin in the fatty tissue every 12 hours and take warfarin 10 mg (2 tablets)   7/11: Inject enoxaparin in the fatty tissue every 12 hours and take warfarin 10 mg (2 tablets)   7/12: Inject enoxaparin in the fatty tissue every 12 hours and take warfarin 7.5 mg (1.5 tablets)   7/13: go to lab for INR check  Information sent to patient via [place].  Patient familiar with injection technique.

## 2020-11-10 ENCOUNTER — Encounter (HOSPITAL_COMMUNITY): Payer: Self-pay | Admitting: *Deleted

## 2020-11-10 ENCOUNTER — Observation Stay (HOSPITAL_COMMUNITY)
Admission: EM | Admit: 2020-11-10 | Discharge: 2020-11-12 | Disposition: A | Discharge status: Home / Self Care | Payer: Medicare HMO | Attending: Cardiology | Admitting: Cardiology

## 2020-11-10 ENCOUNTER — Other Ambulatory Visit: Payer: Self-pay | Admitting: *Deleted

## 2020-11-10 ENCOUNTER — Emergency Department (HOSPITAL_COMMUNITY): Payer: Medicare HMO

## 2020-11-10 DIAGNOSIS — R0789 Other chest pain: Secondary | ICD-10-CM

## 2020-11-10 DIAGNOSIS — Z7901 Long term (current) use of anticoagulants: Secondary | ICD-10-CM | POA: Insufficient documentation

## 2020-11-10 DIAGNOSIS — J449 Chronic obstructive pulmonary disease, unspecified: Secondary | ICD-10-CM | POA: Diagnosis not present

## 2020-11-10 DIAGNOSIS — I25119 Atherosclerotic heart disease of native coronary artery with unspecified angina pectoris: Secondary | ICD-10-CM

## 2020-11-10 DIAGNOSIS — R9439 Abnormal result of other cardiovascular function study: Secondary | ICD-10-CM

## 2020-11-10 DIAGNOSIS — Z20822 Contact with and (suspected) exposure to covid-19: Secondary | ICD-10-CM | POA: Insufficient documentation

## 2020-11-10 DIAGNOSIS — Z952 Presence of prosthetic heart valve: Secondary | ICD-10-CM

## 2020-11-10 DIAGNOSIS — I5032 Chronic diastolic (congestive) heart failure: Secondary | ICD-10-CM | POA: Insufficient documentation

## 2020-11-10 DIAGNOSIS — I25110 Atherosclerotic heart disease of native coronary artery with unstable angina pectoris: Principal | ICD-10-CM | POA: Insufficient documentation

## 2020-11-10 DIAGNOSIS — Z951 Presence of aortocoronary bypass graft: Secondary | ICD-10-CM | POA: Diagnosis not present

## 2020-11-10 DIAGNOSIS — I319 Disease of pericardium, unspecified: Secondary | ICD-10-CM

## 2020-11-10 DIAGNOSIS — Z87891 Personal history of nicotine dependence: Secondary | ICD-10-CM | POA: Diagnosis not present

## 2020-11-10 DIAGNOSIS — R079 Chest pain, unspecified: Secondary | ICD-10-CM

## 2020-11-10 DIAGNOSIS — Z79899 Other long term (current) drug therapy: Secondary | ICD-10-CM | POA: Diagnosis not present

## 2020-11-10 DIAGNOSIS — I110 Hypertensive heart disease with heart failure: Secondary | ICD-10-CM | POA: Insufficient documentation

## 2020-11-10 DIAGNOSIS — I480 Paroxysmal atrial fibrillation: Secondary | ICD-10-CM | POA: Diagnosis present

## 2020-11-10 DIAGNOSIS — E785 Hyperlipidemia, unspecified: Secondary | ICD-10-CM | POA: Diagnosis present

## 2020-11-10 DIAGNOSIS — I200 Unstable angina: Secondary | ICD-10-CM | POA: Diagnosis present

## 2020-11-10 DIAGNOSIS — I10 Essential (primary) hypertension: Secondary | ICD-10-CM | POA: Diagnosis present

## 2020-11-10 LAB — CBC
HCT: 33.7 % — ABNORMAL LOW (ref 39.0–52.0)
Hemoglobin: 12.2 g/dL — ABNORMAL LOW (ref 13.0–17.0)
MCH: 34.7 pg — ABNORMAL HIGH (ref 26.0–34.0)
MCHC: 36.2 g/dL — ABNORMAL HIGH (ref 30.0–36.0)
MCV: 95.7 fL (ref 80.0–100.0)
Platelets: 135 10*3/uL — ABNORMAL LOW (ref 150–400)
RBC: 3.52 MIL/uL — ABNORMAL LOW (ref 4.22–5.81)
RDW: 13.9 % (ref 11.5–15.5)
WBC: 1.7 10*3/uL — ABNORMAL LOW (ref 4.0–10.5)
nRBC: 0 % (ref 0.0–0.2)

## 2020-11-10 LAB — BASIC METABOLIC PANEL
Anion gap: 10 (ref 5–15)
BUN: 8 mg/dL (ref 8–23)
CO2: 21 mmol/L — ABNORMAL LOW (ref 22–32)
Calcium: 8.6 mg/dL — ABNORMAL LOW (ref 8.9–10.3)
Chloride: 93 mmol/L — ABNORMAL LOW (ref 98–111)
Creatinine, Ser: 1.09 mg/dL (ref 0.61–1.24)
GFR, Estimated: >60 mL/min (ref >60)
Glucose, Bld: 106 mg/dL — ABNORMAL HIGH (ref 70–99)
Potassium: 3.6 mmol/L (ref 3.5–5.1)
Sodium: 124 mmol/L — ABNORMAL LOW (ref 135–145)

## 2020-11-10 LAB — BRAIN NATRIURETIC PEPTIDE: B Natriuretic Peptide: 134.9 pg/mL — ABNORMAL HIGH (ref 0.0–100.0)

## 2020-11-10 LAB — TROPONIN I (HIGH SENSITIVITY)
Troponin I (High Sensitivity): 3 ng/L (ref <18)
Troponin I (High Sensitivity): 4 ng/L (ref <18)

## 2020-11-10 MED ORDER — TAMSULOSIN HCL 0.4 MG PO CAPS
0.8000 mg | ORAL_CAPSULE | Freq: Every day | ORAL | Status: DC
  Administered 2020-11-10 – 2020-11-11 (×2): 0.8 mg via ORAL
  Filled 2020-11-10 (×2): qty 2

## 2020-11-10 MED ORDER — AMLODIPINE BESYLATE 10 MG PO TABS
10.0000 mg | ORAL_TABLET | Freq: Every day | ORAL | Status: DC
  Administered 2020-11-11 – 2020-11-12 (×2): 10 mg via ORAL
  Filled 2020-11-10 (×2): qty 1

## 2020-11-10 MED ORDER — ASPIRIN 300 MG RE SUPP: 300.0000 mg | RECTAL | Status: AC

## 2020-11-10 MED ORDER — LORAZEPAM 0.5 MG PO TABS
0.5000 mg | ORAL_TABLET | Freq: Four times a day (QID) | ORAL | Status: DC | PRN
  Administered 2020-11-11: 0.5 mg via ORAL
  Filled 2020-11-10: qty 1

## 2020-11-10 MED ORDER — AMPICILLIN 500 MG PO CAPS
500.0000 mg | ORAL_CAPSULE | Freq: Every evening | ORAL | Status: DC
  Administered 2020-11-11: 500 mg via ORAL
  Filled 2020-11-10 (×4): qty 1

## 2020-11-10 MED ORDER — MORPHINE SULFATE (PF) 2 MG/ML IV SOLN
2.0000 mg | Freq: Once | INTRAVENOUS | Status: AC
  Administered 2020-11-10: 2 mg via INTRAVENOUS
  Filled 2020-11-10: qty 1

## 2020-11-10 MED ORDER — ARFORMOTEROL TARTRATE 15 MCG/2ML IN NEBU
15.0000 ug | INHALATION_SOLUTION | Freq: Two times a day (BID) | RESPIRATORY_TRACT | Status: DC
  Administered 2020-11-11 – 2020-11-12 (×2): 15 ug via RESPIRATORY_TRACT
  Filled 2020-11-10 (×5): qty 2

## 2020-11-10 MED ORDER — ASPIRIN EC 81 MG PO TBEC
81.0000 mg | DELAYED_RELEASE_TABLET | Freq: Every day | ORAL | Status: DC
  Administered 2020-11-11 – 2020-11-12 (×2): 81 mg via ORAL
  Filled 2020-11-10 (×2): qty 1

## 2020-11-10 MED ORDER — ACETAMINOPHEN 325 MG PO TABS
650.0000 mg | ORAL_TABLET | ORAL | Status: DC | PRN
  Administered 2020-11-11 – 2020-11-12 (×2): 650 mg via ORAL
  Filled 2020-11-10 (×2): qty 2

## 2020-11-10 MED ORDER — ONDANSETRON HCL 4 MG/2ML IJ SOLN
4.0000 mg | Freq: Once | INTRAMUSCULAR | Status: AC
  Administered 2020-11-10: 4 mg via INTRAVENOUS
  Filled 2020-11-10: qty 2

## 2020-11-10 MED ORDER — HEPARIN (PORCINE) 25000 UT/250ML-% IV SOLN
1300.0000 [IU]/h | INTRAVENOUS | Status: DC
  Administered 2020-11-10: 1300 [IU]/h via INTRAVENOUS
  Filled 2020-11-10: qty 250

## 2020-11-10 MED ORDER — ONDANSETRON HCL 4 MG/2ML IJ SOLN: 4.0000 mg | Freq: Four times a day (QID) | INTRAMUSCULAR | Status: DC | PRN

## 2020-11-10 MED ORDER — UMECLIDINIUM BROMIDE 62.5 MCG/INH IN AEPB
1.0000 | INHALATION_SPRAY | Freq: Every day | RESPIRATORY_TRACT | Status: DC
  Administered 2020-11-11 – 2020-11-12 (×2): 1 via RESPIRATORY_TRACT
  Filled 2020-11-10: qty 7

## 2020-11-10 MED ORDER — PANTOPRAZOLE SODIUM 40 MG PO TBEC
40.0000 mg | DELAYED_RELEASE_TABLET | Freq: Every day | ORAL | Status: DC
  Administered 2020-11-11 – 2020-11-12 (×2): 40 mg via ORAL
  Filled 2020-11-10 (×2): qty 1

## 2020-11-10 MED ORDER — ASPIRIN 81 MG PO CHEW
324.0000 mg | CHEWABLE_TABLET | ORAL | Status: AC
  Administered 2020-11-10: 324 mg via ORAL
  Filled 2020-11-10: qty 4

## 2020-11-10 MED ORDER — METOPROLOL SUCCINATE ER 25 MG PO TB24
25.0000 mg | ORAL_TABLET | Freq: Every day | ORAL | Status: DC
  Administered 2020-11-11 – 2020-11-12 (×2): 25 mg via ORAL
  Filled 2020-11-10 (×2): qty 1

## 2020-11-10 MED ORDER — POTASSIUM CHLORIDE CRYS ER 20 MEQ PO TBCR
20.0000 meq | EXTENDED_RELEASE_TABLET | Freq: Two times a day (BID) | ORAL | Status: DC
  Administered 2020-11-10 – 2020-11-12 (×4): 20 meq via ORAL
  Filled 2020-11-10 (×4): qty 1

## 2020-11-10 MED ORDER — ACETAMINOPHEN 500 MG PO TABS
1000.0000 mg | ORAL_TABLET | Freq: Once | ORAL | Status: AC
  Administered 2020-11-10: 1000 mg via ORAL
  Filled 2020-11-10: qty 2

## 2020-11-10 MED ORDER — IRBESARTAN 150 MG PO TABS
300.0000 mg | ORAL_TABLET | Freq: Every day | ORAL | Status: DC
  Filled 2020-11-10: qty 2

## 2020-11-10 MED ORDER — FUROSEMIDE 20 MG PO TABS: 20.0000 mg | ORAL_TABLET | Freq: Every morning | ORAL | Status: DC

## 2020-11-10 MED ORDER — ROSUVASTATIN CALCIUM 20 MG PO TABS
20.0000 mg | ORAL_TABLET | Freq: Every day | ORAL | Status: DC
  Administered 2020-11-11 – 2020-11-12 (×2): 20 mg via ORAL
  Filled 2020-11-10 (×2): qty 1

## 2020-11-10 MED ORDER — NITROGLYCERIN 0.4 MG SL SUBL: 0.4000 mg | SUBLINGUAL_TABLET | SUBLINGUAL | Status: DC | PRN

## 2020-11-10 MED ORDER — RANOLAZINE ER 500 MG PO TB12
500.0000 mg | ORAL_TABLET | Freq: Two times a day (BID) | ORAL | Status: DC
  Administered 2020-11-11 – 2020-11-12 (×4): 500 mg via ORAL
  Filled 2020-11-10 (×5): qty 1

## 2020-11-10 NOTE — ED Notes (Signed)
This RN attempted to see if I could give report - charge RN still looking over pt

## 2020-11-10 NOTE — Progress Notes (Signed)
ANTICOAGULATION CONSULT NOTE - Initial Consult  Pharmacy Consult for heparin Indication: chest pain/ACS  Allergies  Allergen Reactions   Doxycycline Rash    Patient Measurements:   Heparin Dosing Weight: 86kg  Vital Signs: Temp: 98.4 F (36.9 C) (07/05 1508) Temp Source: Oral (07/05 1508) BP: 140/67 (07/05 2145) Pulse Rate: 76 (07/05 2145)  Labs: Recent Labs    11/10/20 1513 11/10/20 1851  HGB 12.2*  --   HCT 33.7*  --   PLT 135*  --   CREATININE 1.09  --   TROPONINIHS 3 4    Estimated Creatinine Clearance: 63.2 mL/min (by C-G formula based on SCr of 1.09 mg/dL).   Medical History: Past Medical History:  Diagnosis Date   Anemia    CAD, multiple vessel 12/2002   Found during preop cath for [facility] AVR procedure; Myoview 5/'13: No ischemia or infarct, EF 53% --> cath December 2018: Proximal LAD 65% followed by mid LAD 65% then 100% occlusion.  Patent LIMA-LAD.  SVG-D2 occluded.  RPDA occluded. Seq SVG-r PDA-PL patent to PDA, but the PDA is occluded as is the proximal native PDA   COPD (chronic obstructive pulmonary disease) (HCC)    Diverticulosis    Dyslipidemia, goal LDL below 70    Monitored by PCP. On statin (last labs scan from September 2015: TC 149, TG 127, HDL 55, LDL 69)   ED (erectile dysfunction)    GERD (gastroesophageal reflux disease)    History of SBO (small bowel obstruction) March 2015   Abdominal surgery with LOA   Hx of irritable bowel syndrome    Hypertension, essential    Lesion of right native kidney  08/20/2013   Abdominal US : 2.4 cm x 1.7 complex lesion in the upper pole the right kidney in March of 2012 concerning for RCC although enlarging hemorrhagic cyst could have this appearance as well.,    Monoclonal gammopathy of undetermined significance    Paroxysmal-persistent atrial fibrillation ([place]); CHA2DS2-VASc Score 7 03/23/2017   CHA2DS2-VASc (CHF, HTN, CAD, agex2, TIAx2 = 7)-> Most recent episode January 3-5, 2022; unsuccessful  cardioversion with amiodarone; -> went to ER-synchronized DCCV x1   Psoriasis    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 Cyndia BentDeneen Harts procedure - [employer] aVR (25 mm prosthesis) with aortic root conduit;; Echo 10/'14: EF 55-60%, Mod Conc LVH, Gr 1 DD, Well Seated AoV Mech Prosthesis with normal P gradients (no stenosis), Mod-Severe LA dilation --> follow-up echo November 2016: Well functioning mechanical valve. Paradoxical septal motion. EF 50-55%. Severe LA dilation. Moderate RA dilation.    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

## 2020-11-10 NOTE — ED Provider Notes (Signed)
[facility] EMERGENCY DEPARTMENT Provider Note   CSN: [id number] Arrival date & time: 11/10/20  1458     History Chief Complaint  Patient presents with   Chest Pain    Erik Meyer is a 80 y.o. male possible history of CAD, COPD, dyslipidemia, hypertension who presents for evaluation of chest pain that began this afternoon.  Patient reports he was at home and was laying in bed when he suddenly started having a heaviness and pressure across his chest.  He states it did not radiate anywhere.  He had no associated nausea, vomiting, diaphoresis but he felt tingling in his bilateral hands, prompting him to call EMS.  He was given aspirin, nitro in route.  He states that he thinks maybe that helped his pain a little.  Currently states his pain is a 1/10.  He has not had any shortness of breath.  He states that he has had intermittent chest pain for the last 3 to 4 weeks.  He was seen at [facility] last week and had a reassuring work-up.  He had a stress test done by his cardiologist which showed some abnormalities and he is scheduled for a catheterization this Friday.  He does not smoke.  No personal history of MI.  He does have history of CAD.  No history of hypertension, diabetes.  He denies any abdominal pain, nausea/vomiting.  The history is provided by the patient.      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  Patent LIMA-LAD.  SVG-D2 occluded.  RPDA occluded. Seq SVG-r PDA-PL patent to PDA, but the PDA is occluded as is the proximal native PDA   COPD (chronic obstructive pulmonary disease) (HCC)    Diverticulosis    Dyslipidemia, goal LDL below 70    Monitored by PCP. On statin (last labs scan from September 2015: TC 149, TG 127, HDL 55, LDL 69)   ED  (erectile dysfunction)    GERD (gastroesophageal reflux disease)    History of SBO (small bowel obstruction) March 2015   Abdominal surgery with LOA   Hx of irritable bowel syndrome    Hypertension, essential    Lesion of right native kidney  08/20/2013   Abdominal US : 2.4 cm x 1.7 complex lesion in the upper pole the right kidney in March of 2012 concerning for RCC although enlarging hemorrhagic cyst could have this appearance as well.,    Monoclonal gammopathy of undetermined significance    Paroxysmal-persistent atrial fibrillation ([place]); CHA2DS2-VASc Score 7 03/23/2017   CHA2DS2-VASc (CHF, HTN, CAD, agex2, TIAx2 = 7)-> Most recent episode January 3-5, 2022; unsuccessful cardioversion with amiodarone; -> went to ER-synchronized DCCV x1   Psoriasis    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 Cyndia BentDeneen Harts procedure - St. Jude aVR (25 mm prosthesis) with aortic root conduit;; Echo 10/'14: EF 55-60%, Mod Conc LVH, Gr 1 DD, Well Seated AoV Mech Prosthesis with normal P gradients (no stenosis), Mod-Severe LA dilation --> follow-up echo November 2016: Well functioning mechanical valve. Paradoxical septal motion. EF 50-55%. Severe LA dilation. Moderate RA dilation.    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   LIMA-LAD, SVG-D1, SVG- PDA-PLA (along with AVR)    Patient Active Problem List  Diagnosis Date Noted   Coronary artery disease involving native coronary artery of native heart with angina pectoris ([place]) 10/20/2020   Fatigue due to treatment 08/29/2017   Chronotropic incompetence 08/29/2017   Chest pain, atypical 05/22/2017   Right arm numbness 05/15/2017   Paroxysmal-persistent atrial fibrillation ([place]); CHA2DS2-VASc Score 4 03/23/2017   Abnormal nuclear stress test 03/23/2017   Chronic diastolic heart failure ([place]) 03/23/2017   Obesity (BMI 30-39.9) 03/15/2014   S/P CABG x 4    S/P AVR (aortic valve replacement) and aortoplasty    Coronary artery disease involving coronary  bypass graft of native heart with angina pectoris (HCC)    Dyslipidemia, goal LDL below 70    Hypertension, essential    Long term current use of anticoagulant therapy 08/24/2012   Hx of aortic valve replacement, mechanical 08/24/2012    Past Surgical History:  Procedure Laterality Date   AORTIC VALVE REPLACEMENT  12/26/2002   CG mechanical mechanical AVR as part of Bentall procedure   BENTALL PROCEDURE  12/26/2002   Aortic valve replacement and replacement of aortic root aneurysm using a 25 mm St. JUDE mechanical valve conduit with reimplantation of the coronary arteries   CARDIAC CATHETERIZATION  Aug  13,2004   diffuse  coronary disease,prior to his BENTALL PROCEDURE   CORONARY ARTERY BYPASS GRAFT  12/26/2002   LIMA to LAD,SVG to diagonal, SVG to PDA and PLA.    CORONARY/GRAFT ANGIOGRAPHY N/A 04/21/2017   Procedure: CORONARY/GRAFT ANGIOGRAPHY;  Surgeon: Leonie Man, MD;  Location: [facility] INVASIVE CV LAB: p & mLAD 65% then m100%. Patent LIMA-LAD. 100% SVG-D2 (D1 & D2 fill via native flow. RCA-RPAV patent, 100% ostrPDA, but SVG-rPDA-RPL patent to PDA then occluded.   EXERCISE TOLERANCE TEST  09/12/2017    5 METS Baseline ST depression.  No new changes with exercise.  Heart rate went to 141 bpm.  Poor exercise tolerance reaching target heart rate at 2:26-minute,.  No chronotropic Incompetence   LAPAROSCOPIC LYSIS OF ADHESIONS  March 2015   performed in [place] for [place]  10/04/2011   09/2011: showed no ischemia;EF52%; 03/2017 - [facility]) [place].  Mild LV dilation.  EF 45 to 50%..  Concern for prior infarct in the inferior lateral wall and apical anterior wall consistent with infarct with peri-infarct ischemia. => On review, these findings correlate with cardiac catheter showing inferior infarct and anterior infarct.   PARTIAL COLECTOMY  1994   THORACIC AORTIC ANEURYSM REPAIR  2004   Saint Jude aortic valve conduit-Bentall AVR   TRANSTHORACIC  ECHOCARDIOGRAM  02/2017   [facility]) technically difficult study.  Mild LVH.  Mildly reduced EF of 45-50%.  1+ MR and TR.  Severe LA dilation.  Moderate RV dilation.  Unable to assess diastolic function.  RVP 42 mmHg suggesting mild-moderate pulmonary hypertension.  Mechanical Saint Jude Aortic Valve Prosthesis not well visualized, but gradient 5 mmHg.   TRANSTHORACIC ECHOCARDIOGRAM  11/2018    Stable EF 45-50%-moderate HK of apical-anteroseptal wall..  Moderate asymmetric LVH with GR 1 DD.  Normal RV.  RA mildly dilated.  Moderate MAC.   VENTRAL HERNIA REPAIR     repair with mesh by Dr Collene Mares at [facility] History  Problem Relation Age of Onset   Colon cancer Mother    Congestive Heart Failure Mother        Cardiomyopathy   Lung cancer Father        Was a  chronic smoker   COPD Sister        End-stage COPD    Social History   Tobacco Use   Smoking status: Former    Pack years: 0.00    Types: Cigarettes    Quit date: 06/16/1997    Years since quitting: 23.4   Smokeless tobacco: Never  Vaping Use   Vaping Use: Never used  Substance Use Topics   Alcohol use: Yes   Drug use: No    Home Medications Prior to Admission medications   Medication Sig Start Date End Date Taking? Authorizing Provider  amiodarone (PACERONE) 200 MG tablet Take as directed Patient taking differently: Take 400 mg by mouth 2 (two) times daily as needed (atrial fibrillation). 05/19/20  Yes Leonie Man, MD  amLODipine (NORVASC) 10 MG tablet Take 1 tablet (10 mg total) by mouth daily. Patient taking differently: Take 10 mg by mouth every evening. 07/03/20  Yes Leonie Man, MD  ampicillin (PRINCIPEN) 500 MG capsule Take 500 mg by mouth every evening. 09/04/13  Yes [provider]  betamethasone dipropionate (DIPROLENE) 0.05 % ointment Apply 1 application topically 2 (two) times daily as needed (psoriasis). 06/03/20  Yes [provider]  Cyanocobalamin (RA VITAMIN B12) 2000  MCG TBCR Take 2,000 mcg by mouth in the morning.   Yes [provider]  DENTA 5000 PLUS 1.1 % CREA dental cream Place 1 application onto teeth at bedtime. 06/19/14  Yes [provider]  enoxaparin (LOVENOX) 100 MG/ML injection Inject 1 mL (100 mg total) into the skin every 12 (twelve) hours. 11/07/20  Yes Leonie Man, MD  esomeprazole (NEXIUM) 20 MG capsule Take 20 mg by mouth in the morning.   Yes [provider]  ferrous sulfate 325 (65 FE) MG tablet Take 325 mg by mouth every evening.   Yes [provider]  furosemide (LASIX) 20 MG tablet TAKE 1 TO 2 TABLETS EVERY DAY IF NEEDED Patient taking differently: Take 20 mg by mouth in the morning. 09/07/20  Yes Leonie Man, MD  LORazepam (ATIVAN) 0.5 MG tablet Take 0.5 mg by mouth in the morning, at noon, and at bedtime.   Yes [provider]  metoprolol succinate (TOPROL-XL) 25 MG 24 hr tablet TAKE 1 TABLET EVERY DAY Patient taking differently: Take 25 mg by mouth in the morning. 07/03/20  Yes Leonie Man, MD  metroNIDAZOLE (METROGEL) 1 % gel Apply 1 application topically daily as needed (roasacea). 05/21/20  Yes [provider]  nitroGLYCERIN (NITROSTAT) 0.4 MG SL tablet Place 1 tablet (0.4 mg total) under the tongue every 5 (five) minutes as needed for chest pain. Patient taking differently: Place 0.4 mg under the tongue every 5 (five) minutes x 3 doses as needed for chest pain. 07/01/20  Yes Leonie Man, MD  polyvinyl alcohol (LIQUIFILM TEARS) 1.4 % ophthalmic solution Place 1-2 drops into both eyes 3 (three) times daily as needed for dry eyes.   Yes [provider]  potassium chloride SA (KLOR-CON) 20 MEQ tablet TAKE 1 TABLET TWICE DAILY Patient taking differently: Take 20 mEq by mouth at bedtime. 01/14/20  Yes Leonie Man, MD  ranolazine (RANEXA) 500 MG 12 hr tablet TAKE 1 TABLET TWICE DAILY Patient taking differently: Take 500 mg by mouth in the morning and at bedtime.  10/19/20  Yes Leonie Man, MD  rosuvastatin (CRESTOR) 20 MG tablet TAKE 1 TABLET EVERY DAY Patient taking differently: Take 20 mg by mouth in the morning.  10/07/20  Yes Leonie Man, MD  tamsulosin (FLOMAX) 0.4 MG CAPS capsule Take 0.8 mg by mouth at bedtime.   Yes [provider]  Tiotropium Bromide-Olodaterol (STIOLTO RESPIMAT) 2.5-2.5 MCG/ACT AERS Inhale 2 puffs into the lungs in the morning.   Yes [provider]  valsartan (DIOVAN) 320 MG tablet TAKE 1 TABLET EVERY DAY Patient taking differently: Take 320 mg by mouth in the morning. 07/15/20  Yes Leonie Man, MD  zonisamide (ZONEGRAN) 50 MG capsule Take 100 mg by mouth at bedtime. 10/14/20  Yes [provider]  warfarin (COUMADIN) 5 MG tablet TAKE 1 AND 1/2 TABLETS TO 2 TABLETS DAILY AS DIRECTED BY COUMADIN CLINIC Patient taking differently: Take 7.5 mg by mouth See admin instructions. Take 1.5 tablets (7.5 mg) by mouth in the evenings on Tuesdays, Thursdays & Sundays. Take 2 tablets (10 mg) by mouth in the evening on Mondays, Wednesdays, Fridays & Saturdays. 01/14/20   Leonie Man, MD    Allergies    Doxycycline  Review of Systems   Review of Systems  Constitutional:  Negative for fever.  Respiratory:  Negative for cough and shortness of breath.   Cardiovascular:  Positive for chest pain.  Gastrointestinal:  Negative for abdominal pain, nausea and vomiting.  Genitourinary:  Negative for dysuria and hematuria.  Neurological:  Negative for headaches.  All other systems reviewed and are negative.  Physical Exam Updated Vital Signs BP 125/64   Pulse 76   Temp 98.4 F (36.9 C) (Oral)   Resp 18   SpO2 99%   Physical Exam Vitals and nursing note reviewed.  Constitutional:      Appearance: Normal appearance. He is well-developed.  HENT:     Head: Normocephalic and atraumatic.  Eyes:     General: Lids are normal.     Conjunctiva/sclera: Conjunctivae normal.     Pupils: Pupils are equal,  round, and reactive to light.  Cardiovascular:     Rate and Rhythm: Normal rate and regular rhythm.     Pulses: Normal pulses.     Heart sounds: Murmur heard.    No friction rub. No gallop.  Pulmonary:     Effort: Pulmonary effort is normal.     Breath sounds: Normal breath sounds.     Comments: Lungs clear to auscultation bilaterally.  Symmetric chest rise.  No wheezing, rales, rhonchi. Abdominal:     Palpations: Abdomen is soft. Abdomen is not rigid.     Tenderness: There is no abdominal tenderness. There is no guarding.  Musculoskeletal:        General: Normal range of motion.     Cervical back: Full passive range of motion without pain.  Skin:    General: Skin is warm and dry.     Capillary Refill: Capillary refill takes less than 2 seconds.  Neurological:     Mental Status: He is alert and oriented to person, place, and time.  Psychiatric:        Speech: Speech normal.    ED Results / Procedures / Treatments   Labs (all labs ordered are listed, but only abnormal results are displayed) Labs Reviewed  BASIC METABOLIC PANEL - Abnormal; Notable for the following components:      Result Value   Sodium 124 (*)    Chloride 93 (*)    CO2 21 (*)    Glucose, Bld 106 (*)    Calcium 8.6 (*)    All other components within normal limits  CBC -  Abnormal; Notable for the following components:   WBC 1.7 (*)    RBC 3.52 (*)    Hemoglobin 12.2 (*)    HCT 33.7 (*)    MCH 34.7 (*)    MCHC 36.2 (*)    Platelets 135 (*)    All other components within normal limits  TROPONIN I (HIGH SENSITIVITY)  TROPONIN I (HIGH SENSITIVITY)    EKG EKG Interpretation  Date/Time:  Tuesday November 10 2020 15:09:54 EDT Ventricular Rate:  86 PR Interval:    QRS Duration: 88 QT Interval:  396 QTC Calculation: 473 R Axis:   -6 Text Interpretation: Accelerated Junctional rhythm with occasional Premature ventricular complexes Low voltage QRS Septal infarct , age undetermined Abnormal ECG No significant  change since last tracing Confirmed by Isla Pence [phone]) on 11/10/2020 6:13:54 PM  Radiology DG Chest 2 View  Result Date: 11/10/2020 CLINICAL DATA:  Chest pain. EXAM: CHEST - 2 VIEW COMPARISON:  Radiograph 11/05/2020 FINDINGS: Patient is post median sternotomy. Prosthetic valve again seen. Stable heart size and mediastinal contours. Aortic atherosclerosis. Similar bibasilar atelectasis. There may be a small left pleural effusion. No pulmonary edema. No pneumothorax. No acute osseous abnormalities are seen. IMPRESSION: Similar bibasilar atelectasis to recent exam. Possible small left pleural effusion. Electronically Signed   By: Keith Rake M.D.   On: 11/10/2020 15:35    Procedures Procedures   Medications Ordered in ED Medications  morphine 2 MG/ML injection 2 mg (has no administration in time range)  ondansetron (ZOFRAN) injection 4 mg (has no administration in time range)    ED Course  I have reviewed the triage vital signs and the nursing notes.  Pertinent labs & imaging results that were available during my care of the patient were reviewed by me and considered in my medical decision making (see chart for details).    MDM Rules/Calculators/A&P                          79 year-old male who presents for evaluation of chest pain.  History of CAD and is scheduled for a catheterization this Friday.  Reports he was at home when he started developing some chest pain.  No associated nausea, vomiting, diaphoresis.  Received aspirin, nitro with EMS.  States pain is now 1/10.  On initial arrival, he is afebrile, toxic appearing.  Vital signs are stable.  Consider ACS etiology.  We will plan to check labs, chest x-ray, EKG.  CBC shows leukopenia of 1.7.  Hemoglobin is 12.2.  Platelets are 135.  Review of his record shows consistent with his previous.  BMP shows sodium of 124.  Review of records show that patient consistently has hyponatremia.  His last one about 5 days ago was 128.  BUN  and creatinine are within normal limits.  Chest x-ray shows possible small pleural effusion on the left.  Patient signed out to Dr. Gilford Raid pending Cardiology consult.   Portions of this note were generated with [employer]. Dictation errors may occur despite best attempts at proofreading.   Final Clinical Impression(s) / ED Diagnoses Final diagnoses:  Nonspecific chest pain    Rx / DC Orders ED Discharge Orders     None        Desma Mcgregor 11/10/20 1845    Isla Pence, MD 11/10/20 2220

## 2020-11-10 NOTE — ED Notes (Signed)
Pt reports 2/10 pain - reports having chest tightness and headache - MD notified

## 2020-11-10 NOTE — ED Triage Notes (Signed)
Pt arrived by [facility]. Having chest pain, is scheduled for cardiac cath on Friday. Received ASA and nitro pta. No resp distress noted at triage.

## 2020-11-10 NOTE — Progress Notes (Signed)
Ordered placed for left heart cath. With possible PCI

## 2020-11-10 NOTE — ED Provider Notes (Signed)
Emergency Medicine Provider Triage Evaluation Note  Erik Meyer , a 80 y.o. male  was evaluated in triage.  Pt complains of chest pain which has been ongoing for 1 month.  Followed by cardiologist Dr. Ellyn Hack, was scheduled for a heart cath on Friday, states that the pain has been exacerbated nature.  Taking nitro along with aspirin by EMS.  Also endorses shortness of breath.  Review of Systems  Positive: Chest pain, SOB Negative: Fever, cough, leg swelling  Physical Exam  BP 129/67   Pulse 86   Temp 98.4 F (36.9 C) (Oral)   Resp 18   SpO2 100%  Gen:   Awake, in distress   Resp:  Normal effort  MSK:   Moves extremities without difficulty  Other:  Appears very uncomfortable, having a hard time catching his breath.  Medical Decision Making  Medically screening exam initiated at 3:11 PM.  Appropriate orders placed.  MORGAN RENNERT was informed that the remainder of the evaluation will be completed by another provider, this initial triage assessment does not replace that evaluation, and the importance of remaining in the ED until their evaluation is complete.  EKG has been ordered, labs, will need a room ASAP.  To contact Dr. Ellyn Hack cardiologist   Janeece Fitting, PA-C 11/10/20 1512    Lucrezia Starch, MD 11/11/20 [phone]

## 2020-11-11 ENCOUNTER — Encounter (HOSPITAL_COMMUNITY): Payer: Self-pay | Admitting: Cardiology

## 2020-11-11 ENCOUNTER — Observation Stay (HOSPITAL_BASED_OUTPATIENT_CLINIC_OR_DEPARTMENT_OTHER): Payer: Medicare HMO

## 2020-11-11 ENCOUNTER — Encounter (HOSPITAL_COMMUNITY)
Admission: EM | Disposition: A | Discharge status: Home / Self Care | Payer: Self-pay | Source: Home / Self Care | Attending: Emergency Medicine

## 2020-11-11 ENCOUNTER — Other Ambulatory Visit: Payer: Self-pay

## 2020-11-11 ENCOUNTER — Ambulatory Visit (HOSPITAL_COMMUNITY): Admission: RE | Admit: 2020-11-11 | Payer: Medicare HMO | Source: Home / Self Care | Admitting: Cardiology

## 2020-11-11 DIAGNOSIS — I25119 Atherosclerotic heart disease of native coronary artery with unspecified angina pectoris: Secondary | ICD-10-CM

## 2020-11-11 DIAGNOSIS — J449 Chronic obstructive pulmonary disease, unspecified: Secondary | ICD-10-CM | POA: Diagnosis not present

## 2020-11-11 DIAGNOSIS — I25110 Atherosclerotic heart disease of native coronary artery with unstable angina pectoris: Secondary | ICD-10-CM | POA: Diagnosis not present

## 2020-11-11 DIAGNOSIS — Z951 Presence of aortocoronary bypass graft: Secondary | ICD-10-CM | POA: Diagnosis not present

## 2020-11-11 DIAGNOSIS — Z20822 Contact with and (suspected) exposure to covid-19: Secondary | ICD-10-CM | POA: Diagnosis not present

## 2020-11-11 DIAGNOSIS — I200 Unstable angina: Secondary | ICD-10-CM

## 2020-11-11 DIAGNOSIS — I25118 Atherosclerotic heart disease of native coronary artery with other forms of angina pectoris: Secondary | ICD-10-CM

## 2020-11-11 DIAGNOSIS — I10 Essential (primary) hypertension: Secondary | ICD-10-CM | POA: Diagnosis not present

## 2020-11-11 HISTORY — PX: TRANSTHORACIC ECHOCARDIOGRAM: SHX275

## 2020-11-11 HISTORY — PX: LEFT HEART CATH AND CORONARY ANGIOGRAPHY: CATH118249

## 2020-11-11 LAB — BASIC METABOLIC PANEL
Anion gap: 8 (ref 5–15)
BUN: 7 mg/dL — ABNORMAL LOW (ref 8–23)
CO2: 22 mmol/L (ref 22–32)
Calcium: 8.3 mg/dL — ABNORMAL LOW (ref 8.9–10.3)
Chloride: 98 mmol/L (ref 98–111)
Creatinine, Ser: 0.86 mg/dL (ref 0.61–1.24)
GFR, Estimated: >60 mL/min (ref >60)
Glucose, Bld: 104 mg/dL — ABNORMAL HIGH (ref 70–99)
Potassium: 3.4 mmol/L — ABNORMAL LOW (ref 3.5–5.1)
Sodium: 128 mmol/L — ABNORMAL LOW (ref 135–145)

## 2020-11-11 LAB — CBC
HCT: 30.2 % — ABNORMAL LOW (ref 39.0–52.0)
Hemoglobin: 11.3 g/dL — ABNORMAL LOW (ref 13.0–17.0)
MCH: 35.2 pg — ABNORMAL HIGH (ref 26.0–34.0)
MCHC: 37.4 g/dL — ABNORMAL HIGH (ref 30.0–36.0)
MCV: 94.1 fL (ref 80.0–100.0)
Platelets: 120 10*3/uL — ABNORMAL LOW (ref 150–400)
RBC: 3.21 MIL/uL — ABNORMAL LOW (ref 4.22–5.81)
RDW: 13.7 % (ref 11.5–15.5)
WBC: 1.8 10*3/uL — ABNORMAL LOW (ref 4.0–10.5)
nRBC: 0 % (ref 0.0–0.2)

## 2020-11-11 LAB — ECHOCARDIOGRAM COMPLETE
AR max vel: 1.73 cm2
AV Area VTI: 2.01 cm2
AV Area mean vel: 1.75 cm2
AV Mean grad: 6 mmHg
AV Peak grad: 11.5 mmHg
Ao pk vel: 1.7 m/s
Calc EF: 60.2 %
Height: 69 in
S' Lateral: 4.3 cm
Single Plane A2C EF: 63.9 %
Single Plane A4C EF: 54.1 %
Weight: 3281.6 oz

## 2020-11-11 LAB — PROTIME-INR
INR: 1.7 — ABNORMAL HIGH (ref 0.8–1.2)
INR: 1.6 — ABNORMAL HIGH (ref 0.8–1.2)
Prothrombin Time: 20.4 seconds — ABNORMAL HIGH (ref 11.4–15.2)
Prothrombin Time: 19 seconds — ABNORMAL HIGH (ref 11.4–15.2)

## 2020-11-11 LAB — LIPID PANEL
Cholesterol: 132 mg/dL (ref 0–200)
HDL: 65 mg/dL (ref >40)
LDL Cholesterol: 54 mg/dL (ref 0–99)
Total CHOL/HDL Ratio: 2 RATIO
Triglycerides: 65 mg/dL (ref <150)
VLDL: 13 mg/dL (ref 0–40)

## 2020-11-11 LAB — SARS CORONAVIRUS 2 (TAT 6-24 HRS): SARS Coronavirus 2: NEGATIVE

## 2020-11-11 LAB — HEPARIN LEVEL (UNFRACTIONATED): Heparin Unfractionated: 0.51 IU/mL (ref 0.30–0.70)

## 2020-11-11 SURGERY — LEFT HEART CATH AND CORONARY ANGIOGRAPHY
Anesthesia: LOCAL

## 2020-11-11 MED ORDER — HEPARIN (PORCINE) IN NACL 1000-0.9 UT/500ML-% IV SOLN
INTRAVENOUS | Status: AC
  Filled 2020-11-11: qty 1000

## 2020-11-11 MED ORDER — FENTANYL CITRATE (PF) 100 MCG/2ML IJ SOLN
INTRAMUSCULAR | Status: DC | PRN
  Administered 2020-11-11: 25 ug via INTRAVENOUS

## 2020-11-11 MED ORDER — HEPARIN (PORCINE) IN NACL 1000-0.9 UT/500ML-% IV SOLN
INTRAVENOUS | Status: DC | PRN
  Administered 2020-11-11 (×2): 500 mL

## 2020-11-11 MED ORDER — LIDOCAINE HCL (PF) 1 % IJ SOLN
INTRAMUSCULAR | Status: DC | PRN
  Administered 2020-11-11: 2 mL

## 2020-11-11 MED ORDER — HEPARIN SODIUM (PORCINE) 1000 UNIT/ML IJ SOLN
INTRAMUSCULAR | Status: DC | PRN
  Administered 2020-11-11: 4500 [IU] via INTRAVENOUS

## 2020-11-11 MED ORDER — LIDOCAINE HCL (PF) 1 % IJ SOLN
INTRAMUSCULAR | Status: AC
  Filled 2020-11-11: qty 30

## 2020-11-11 MED ORDER — VERAPAMIL HCL 2.5 MG/ML IV SOLN
INTRAVENOUS | Status: AC
  Filled 2020-11-11: qty 2

## 2020-11-11 MED ORDER — WARFARIN SODIUM 5 MG PO TABS
10.0000 mg | ORAL_TABLET | Freq: Once | ORAL | Status: AC
  Administered 2020-11-11: 10 mg via ORAL
  Filled 2020-11-11: qty 2

## 2020-11-11 MED ORDER — MIDAZOLAM HCL 2 MG/2ML IJ SOLN
INTRAMUSCULAR | Status: DC | PRN
  Administered 2020-11-11: 1 mg via INTRAVENOUS

## 2020-11-11 MED ORDER — SODIUM CHLORIDE 0.9% FLUSH: 3.0000 mL | INTRAVENOUS | Status: DC | PRN

## 2020-11-11 MED ORDER — MORPHINE SULFATE (PF) 2 MG/ML IV SOLN
1.0000 mg | Freq: Once | INTRAVENOUS | Status: AC
  Administered 2020-11-11: 1 mg via INTRAVENOUS
  Filled 2020-11-11: qty 1

## 2020-11-11 MED ORDER — LORAZEPAM 0.5 MG PO TABS
0.5000 mg | ORAL_TABLET | Freq: Three times a day (TID) | ORAL | Status: DC
  Administered 2020-11-11 – 2020-11-12 (×4): 0.5 mg via ORAL
  Filled 2020-11-11 (×4): qty 1

## 2020-11-11 MED ORDER — WARFARIN - PHARMACIST DOSING INPATIENT: Freq: Every day | Status: DC

## 2020-11-11 MED ORDER — SODIUM CHLORIDE 0.9 % WEIGHT BASED INFUSION
1.0000 mL/kg/h | INTRAVENOUS | Status: DC
Start: 2020-11-11 — End: 2020-11-11
  Administered 2020-11-11 (×2): 1 mL/kg/h via INTRAVENOUS

## 2020-11-11 MED ORDER — SODIUM CHLORIDE 0.9 % WEIGHT BASED INFUSION
3.0000 mL/kg/h | INTRAVENOUS | Status: AC
  Administered 2020-11-11: 3 mL/kg/h via INTRAVENOUS

## 2020-11-11 MED ORDER — IOHEXOL 350 MG/ML SOLN
INTRAVENOUS | Status: DC | PRN
  Administered 2020-11-11: 100 mL

## 2020-11-11 MED ORDER — MIDAZOLAM HCL 2 MG/2ML IJ SOLN
INTRAMUSCULAR | Status: AC
  Filled 2020-11-11: qty 2

## 2020-11-11 MED ORDER — ZONISAMIDE 100 MG PO CAPS
100.0000 mg | ORAL_CAPSULE | Freq: Every day | ORAL | Status: DC
  Administered 2020-11-11: 100 mg via ORAL
  Filled 2020-11-11 (×2): qty 1

## 2020-11-11 MED ORDER — VERAPAMIL HCL 2.5 MG/ML IV SOLN
INTRAVENOUS | Status: DC | PRN
  Administered 2020-11-11: 10 mL via INTRA_ARTERIAL

## 2020-11-11 MED ORDER — IRBESARTAN 150 MG PO TABS
300.0000 mg | ORAL_TABLET | Freq: Every day | ORAL | Status: DC
  Administered 2020-11-11 – 2020-11-12 (×2): 300 mg via ORAL
  Filled 2020-11-11: qty 2

## 2020-11-11 MED ORDER — HEPARIN SODIUM (PORCINE) 1000 UNIT/ML IJ SOLN
INTRAMUSCULAR | Status: AC
  Filled 2020-11-11: qty 1

## 2020-11-11 MED ORDER — FENTANYL CITRATE (PF) 100 MCG/2ML IJ SOLN
INTRAMUSCULAR | Status: AC
  Filled 2020-11-11: qty 2

## 2020-11-11 MED ORDER — FUROSEMIDE 20 MG PO TABS
20.0000 mg | ORAL_TABLET | Freq: Every day | ORAL | Status: DC
  Administered 2020-11-12: 20 mg via ORAL
  Filled 2020-11-11: qty 1

## 2020-11-11 SURGICAL SUPPLY — 11 items
CATH EXPO 5F MPA-1 (CATHETERS) ×2 IMPLANT
CATH INFINITI 5FR MULTPACK ANG (CATHETERS) ×2 IMPLANT
DEVICE RAD COMP TR BAND LRG (VASCULAR PRODUCTS) ×2 IMPLANT
GLIDESHEATH SLEND SS 6F .021 (SHEATH) ×2 IMPLANT
KIT HEART LEFT (KITS) ×2 IMPLANT
PACK CARDIAC CATHETERIZATION (CUSTOM PROCEDURE TRAY) ×2 IMPLANT
SHEATH PROBE COVER 6X72 (BAG) ×2 IMPLANT
TRANSDUCER W/STOPCOCK (MISCELLANEOUS) ×2 IMPLANT
TUBING CIL FLEX 10 FLL-RA (TUBING) ×2 IMPLANT
WIRE EMERALD 3MM-J .035X150CM (WIRE) ×2 IMPLANT
WIRE HI TORQ VERSACORE J 260CM (WIRE) ×2 IMPLANT

## 2020-11-11 NOTE — H&P (Signed)
Cardiology Admission History and Physical:   Patient ID: Erik Meyer MRN: [id number]; DOB: January 16, 1941   Admission date: 11/10/2020  Primary Care Provider: Ivan Anchors, MD Primary Cardiologist: Glenetta Hew, MD  Primary Electrophysiologist:  None   Chief Complaint:  Chest pain  Patient Profile:   Erik Meyer is a 80 y.o. 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  History of Present Illness:   [name] is a 80 year old male with CV history as above including prior CABG, AVR, and pAF who presented to ED with complaint of chest pain.  He has been having worsening chest pain of late with minimal exertion and underwent vasodilator MPI recently which showed separate defects in inferiorlateral and anterior/anteroapical distributions consistent with prior infarct and peri-infarct ischemia.  Given ongoing symptoms he had been planned for invasive angiogram later this week.  His symptoms were worsening and starting to involve more consistent episodes of chest pain at rest so he presented to ED.  ECG presonally reviewed NSR with prior septal infarct and non-specific ST changes unchanged form prior.  Notably pancyptopenic.  Troponin 3, 4 on serial measurement.  BNP 135.  Sodium 124 with known chronic hyponatremia.  Of note, he has been holding warfarin with lovenox bridge in anticipation of invasive angiogram since last Friday.  Cardiac Cath 04/11/2017: Native RCA ~patent to PAV/PL, Native LM-LCx(OM1 & OM2), LAD-D1 & D2 then 100% after SP2). Patent LIMA-dLAD, occluded SeqSVG-RPDA-PL & SVG-D2.   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  Patent LIMA-LAD.  SVG-D2 occluded.  RPDA occluded. Seq SVG-r PDA-PL patent to PDA, but the PDA is occluded as is the proximal native PDA   COPD (chronic obstructive pulmonary disease) (HCC)    Diverticulosis    Dyslipidemia, goal LDL below 70    Monitored by PCP. On statin (last labs scan from September 2015: TC 149, TG 127, HDL 55, LDL 69)   ED (erectile dysfunction)    GERD (gastroesophageal reflux disease)    History of SBO (small bowel obstruction) March 2015   Abdominal surgery with LOA   Hx of irritable bowel syndrome    Hypertension, essential    Lesion of right native kidney  08/20/2013   Abdominal US : 2.4 cm x 1.7 complex lesion in the upper pole the right kidney in March of 2012 concerning for RCC although enlarging hemorrhagic cyst could have this appearance as well.,    Monoclonal gammopathy of undetermined significance    Paroxysmal-persistent atrial fibrillation ([place]); CHA2DS2-VASc Score 7 03/23/2017   CHA2DS2-VASc (CHF, HTN, CAD, agex2, TIAx2 = 7)-> Most recent episode January 3-5, 2022; unsuccessful cardioversion with amiodarone; -> went to ER-synchronized DCCV x1   Psoriasis    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 Cyndia BentDeneen Harts procedure - St. Jude aVR (25 mm prosthesis) with aortic root conduit;; Echo 10/'14: EF 55-60%, Mod Conc LVH, Gr 1 DD, Well Seated AoV Mech Prosthesis with normal P gradients (no stenosis), Mod-Severe LA dilation --> follow-up echo November 2016: Well functioning mechanical valve. Paradoxical septal motion. EF 50-55%. Severe LA dilation. Moderate RA  dilation.    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   LIMA-LAD, SVG-D1, SVG- PDA-PLA (along with AVR)    Past Surgical History:  Procedure Laterality Date   AORTIC VALVE REPLACEMENT  12/26/2002   CG mechanical mechanical AVR as part of Bentall procedure   BENTALL PROCEDURE  12/26/2002   Aortic valve replacement and replacement of aortic root aneurysm using a 25 mm St.  JUDE mechanical valve conduit with reimplantation of the coronary arteries   CARDIAC CATHETERIZATION  Aug  13,2004   diffuse  coronary disease,prior to his [place] GRAFT  12/26/2002   LIMA to LAD,SVG to diagonal, SVG to PDA and PLA.    CORONARY/GRAFT ANGIOGRAPHY N/A 04/21/2017   Procedure: CORONARY/GRAFT ANGIOGRAPHY;  Surgeon: Leonie Man, MD;  Location: [facility] INVASIVE CV LAB: p & mLAD 65% then m100%. Patent LIMA-LAD. 100% SVG-D2 (D1 & D2 fill via native flow. RCA-RPAV patent, 100% ostrPDA, but SVG-rPDA-RPL patent to PDA then occluded.   EXERCISE TOLERANCE TEST  09/12/2017    5 METS Baseline ST depression.  No new changes with exercise.  Heart rate went to 141 bpm.  Poor exercise tolerance reaching target heart rate at 2:26-minute,.  No chronotropic Incompetence   LAPAROSCOPIC LYSIS OF ADHESIONS  March 2015   performed in [place] for [place]  10/04/2011   09/2011: showed no ischemia;EF52%; 03/2017 - [facility]) [place].  Mild LV dilation.  EF 45 to 50%..  Concern for prior infarct in the inferior lateral wall and apical anterior wall consistent with infarct with peri-infarct ischemia. => On review, these findings correlate with cardiac catheter showing inferior infarct and anterior infarct.   PARTIAL COLECTOMY  1994   THORACIC AORTIC ANEURYSM REPAIR  2004   Saint Jude aortic valve conduit-Bentall AVR   TRANSTHORACIC ECHOCARDIOGRAM  02/2017   [facility]) technically difficult study.  Mild LVH.  Mildly reduced EF of 45-50%.  1+ MR and TR.  Severe LA dilation.  Moderate RV dilation.  Unable to assess diastolic function.  RVP 42 mmHg suggesting mild-moderate pulmonary hypertension.  Mechanical Saint Jude Aortic Valve Prosthesis not well visualized, but gradient 5 mmHg.   TRANSTHORACIC ECHOCARDIOGRAM  11/2018    Stable EF 45-50%-moderate HK of apical-anteroseptal wall..  Moderate asymmetric LVH with GR 1 DD.  Normal RV.  RA mildly  dilated.  Moderate MAC.   VENTRAL HERNIA REPAIR     repair with mesh by Dr Collene Mares at [facility]     Medications Prior to Admission: Prior to Admission medications   Medication Sig Start Date End Date Taking? Authorizing Provider  amLODipine (NORVASC) 10 MG tablet Take 1 tablet (10 mg total) by mouth daily. Patient taking differently: Take 10 mg by mouth every evening. 07/03/20  Yes Leonie Man, MD  ampicillin (PRINCIPEN) 500 MG capsule Take 500 mg by mouth every evening. 09/04/13  Yes [provider]  betamethasone dipropionate (DIPROLENE) 0.05 % ointment Apply 1 application topically 2 (two) times daily as needed (psoriasis). 06/03/20  Yes [provider]  Cyanocobalamin (RA VITAMIN B12) 2000 MCG TBCR Take 2,000 mcg by mouth in the morning.   Yes [provider]  DENTA 5000 PLUS 1.1 % CREA dental cream Place 1 application onto teeth at bedtime. 06/19/14  Yes [provider]  enoxaparin (LOVENOX) 100 MG/ML injection Inject 1 mL (100 mg total) into the skin every 12 (twelve) hours. 11/07/20  Yes Leonie Man, MD  esomeprazole Jonna Munro)  20 MG capsule Take 20 mg by mouth in the morning.   Yes [provider]  ferrous sulfate 325 (65 FE) MG tablet Take 325 mg by mouth every evening.   Yes [provider]  furosemide (LASIX) 20 MG tablet TAKE 1 TO 2 TABLETS EVERY DAY IF NEEDED Patient taking differently: Take 20 mg by mouth in the morning. 09/07/20  Yes Leonie Man, MD  LORazepam (ATIVAN) 0.5 MG tablet Take 0.5 mg by mouth in the morning, at noon, and at bedtime.   Yes [provider]  metoprolol succinate (TOPROL-XL) 25 MG 24 hr tablet TAKE 1 TABLET EVERY DAY Patient taking differently: Take 25 mg by mouth in the morning. 07/03/20  Yes Leonie Man, MD  metroNIDAZOLE (METROGEL) 1 % gel Apply 1 application topically daily as needed (roasacea). 05/21/20  Yes [provider]  nitroGLYCERIN (NITROSTAT) 0.4 MG SL tablet Place 1  tablet (0.4 mg total) under the tongue every 5 (five) minutes as needed for chest pain. Patient taking differently: Place 0.4 mg under the tongue every 5 (five) minutes x 3 doses as needed for chest pain. 07/01/20  Yes Leonie Man, MD  polyvinyl alcohol (LIQUIFILM TEARS) 1.4 % ophthalmic solution Place 1-2 drops into both eyes 3 (three) times daily as needed for dry eyes.   Yes [provider]  potassium chloride SA (KLOR-CON) 20 MEQ tablet TAKE 1 TABLET TWICE DAILY Patient taking differently: Take 20 mEq by mouth at bedtime. 01/14/20  Yes Leonie Man, MD  ranolazine (RANEXA) 500 MG 12 hr tablet TAKE 1 TABLET TWICE DAILY Patient taking differently: Take 500 mg by mouth in the morning and at bedtime. 10/19/20  Yes Leonie Man, MD  rosuvastatin (CRESTOR) 20 MG tablet TAKE 1 TABLET EVERY DAY Patient taking differently: Take 20 mg by mouth in the morning. 10/07/20  Yes Leonie Man, MD  tamsulosin (FLOMAX) 0.4 MG CAPS capsule Take 0.8 mg by mouth at bedtime.   Yes [provider]  Tiotropium Bromide-Olodaterol (STIOLTO RESPIMAT) 2.5-2.5 MCG/ACT AERS Inhale 2 puffs into the lungs in the morning.   Yes [provider]  valsartan (DIOVAN) 320 MG tablet TAKE 1 TABLET EVERY DAY Patient taking differently: Take 320 mg by mouth in the morning. 07/15/20  Yes Leonie Man, MD  warfarin (COUMADIN) 5 MG tablet TAKE 1 AND 1/2 TABLETS TO 2 TABLETS DAILY AS DIRECTED BY COUMADIN CLINIC Patient taking differently: Take 7.5 mg by mouth See admin instructions. Take 1.5 tablets (7.5 mg) by mouth in the evenings on Tuesdays, Thursdays & Sundays. Take 2 tablets (10 mg) by mouth in the evening on Mondays, Wednesdays, Fridays & Saturdays. 01/14/20  Yes Leonie Man, MD  zonisamide (ZONEGRAN) 50 MG capsule Take 100 mg by mouth at bedtime. 10/14/20  Yes [provider]  amiodarone (PACERONE) 200 MG tablet Take as directed Patient taking differently: Take 400 mg by mouth 2  (two) times daily as needed (atrial fibrillation). 05/19/20   Leonie Man, MD     Allergies:    Allergies  Allergen Reactions   Doxycycline Rash    Social History:   Social History   Socioeconomic History   Marital status: Married    Spouse name: Not on file   Number of children: 0   Years of education: Not on file   Highest education level: Not on file  Occupational History    Comment: Retired  Tobacco Use   Smoking status: Former  Pack years: 0.00    Types: Cigarettes    Quit date: 06/16/1997    Years since quitting: 23.4   Smokeless tobacco: Never  Vaping Use   Vaping Use: Never used  Substance and Sexual Activity   Alcohol use: Yes   Drug use: No   Sexual activity: Not on file  Other Topics Concern   Not on file  Social History Narrative   Married gentleman with no children. Quit smoking in 1998.   Education: 2 years of college. He does drink caffeine.  He drinks social beer.   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 Claiborne Billings in [place]; GI: Dr. Janus Molder in [place]      **November 2020: He notes increased stress family to care for his wife who is showing signs of worsening dementia.  Hard to adjust to this and is somewhat depressing.    -He is try to work on cutting back on his intake of salt specific   Social Determinants of Health   Financial Resource Strain: Not on file  Food Insecurity: Not on file  Transportation Needs: Not on file  Physical Activity: Not on file  Stress: Not on file  Social Connections: Not on file  Intimate Partner Violence: Not on file    Family History:   The patient's family history includes COPD in his sister; Colon cancer in his mother; Congestive Heart Failure in his mother; Lung cancer in his father.    ROS:  Please see the history of present illness.  All  other ROS reviewed and negative.     Physical Exam/Data:   Vitals:   11/10/20 2245 11/10/20 2300 11/10/20 2330 11/11/20 0047  BP: 118/61 (!) 113/92 (!) 104/53 (!) 145/69  Pulse: 78 80 74 78  Resp: _0 Temp:    98 F (36.7 C)  TempSrc:    Oral  SpO2: 93% 97% 95% 97%  Weight:    93 kg  Height:    _1  (1.753 m)    Intake/Output Summary (Last 24 hours) at 11/11/2020 0346 Last data filed at 11/10/2020 2159 Gross per 24 hour  Intake --  Output 750 ml  Net -750 ml   Last 3 Weights 11/11/2020 10/28/2020 10/19/2020  Weight (lbs) 205 lb 1.6 oz 207 lb 207 lb 9.6 oz  Weight (kg) 93.033 kg 93.895 kg 94.167 kg     Body mass index is 30.29 kg/m.  General:  Well nourished, well developed, in no acute distress HEENT: normal Lymph: no adenopathy Neck: no JVD Endocrine:  No thryomegaly Vascular: No carotid bruits; DP 2+ bilaterally Cardiac:  normal S1, crisp mechanical S2; RRR; no murmur  Lungs:  clear to auscultation bilaterally, no wheezing, rhonchi or rales  Abd: soft, nontender, no hepatomegaly  Ext: 1+ edema edema Musculoskeletal:  No deformities, BUE and BLE strength normal and equal Skin: warm and dry  Neuro:  CNs 2-12 intact, no focal abnormalities noted Psych:  Normal affect   Relevant CV Studies: LHC 04/2017 Prox LAD to Mid LAD lesion is 65% stenosed. Mid LAD-1 lesion is 65% stenosed. Mid LAD-2 lesion is 100% stenosed. There is competitive flow. LIMA-dLAD is moderate in size. SVG-Diag2 graft was visualized by angiography and is large. Origin lesion is 100% stenosed. Ost RPDA to RPDA lesion is 100% stenosed.  Seq SVG- rPDA-PL graft was visualized by angiography and is very large. Origin lesion before RPDA is 99% stenosed. Prox Graft to Insertion lesion before RPDA is 100% stenosed.  TTE 11/27/2018  1. Moderate hypokinesis of the left ventricular, apical anteroseptal  wall.   2. The left ventricle has mildly reduced systolic function, with an  ejection fraction of  45-50%. The cavity size was normal. There is moderate  asymmetric left ventricular hypertrophy. Left ventricular diastolic  Doppler parameters are consistent with  impaired relaxation.   3. The right ventricle has normal systolic function. The cavity was  normal. There is no increase in right ventricular wall thickness.   4. Right atrial size was mildly dilated.   5. There is moderate mitral annular calcification present.   6. Aortic valve regurgitation is trivial by color flow Doppler. No  stenosis of the aortic valve.   7. The pulmonic valve was not well visualized. Pulmonic valve  regurgitation is moderate by color flow Doppler.   8. The aortic root is normal in size and structure.   9. There is redundancy of the interatrial septum.   Laboratory Data:  High Sensitivity Troponin:   Recent Labs  Lab 11/05/20 1537 11/05/20 1737 11/10/20 1513 11/10/20 1851  TROPONINIHS _0 Chemistry Recent Labs  Lab 11/05/20 1537 11/10/20 1513  NA 128* 124*  K 4.2 3.6  CL 92* 93*  CO2 25 21*  GLUCOSE 112* 106*  BUN 15 8  CREATININE 0.98 1.09  CALCIUM 8.8* 8.6*  GFRNONAA >60 >60  ANIONGAP 11 10    Recent Labs  Lab 11/05/20 1622  PROT 6.3*  ALBUMIN 3.9  AST 19  ALT 17  ALKPHOS 59  BILITOT 0.8   Hematology Recent Labs  Lab 11/05/20 1537 11/10/20 1513  WBC 2.0* 1.7*  RBC 3.75* 3.52*  HGB 12.9* 12.2*  HCT 36.8* 33.7*  MCV 98.1 95.7  MCH 34.4* 34.7*  MCHC 35.1 36.2*  RDW 14.0 13.9  PLT 121* 135*   BNP Recent Labs  Lab 11/10/20 2216  BNP 134.9*    DDimer No results for input(s): DDIMER in the last 168 hours.   Radiology/Studies:  DG Chest 2 View  Result Date: 11/10/2020 CLINICAL DATA:  Chest pain. EXAM: CHEST - 2 VIEW COMPARISON:  Radiograph 11/05/2020 FINDINGS: Patient is post median sternotomy. Prosthetic valve again seen. Stable heart size and mediastinal contours. Aortic atherosclerosis. Similar bibasilar atelectasis. There may be a small left pleural  effusion. No pulmonary edema. No pneumothorax. No acute osseous abnormalities are seen. IMPRESSION: Similar bibasilar atelectasis to recent exam. Possible small left pleural effusion. Electronically Signed   By: Keith Rake M.D.   On: 11/10/2020 15:35      TIMI Risk Score for Unstable Angina or Non-ST Elevation MI:   The patient's TIMI risk score is 5, which indicates a 26% risk of all cause mortality, new or recurrent myocardial infarction or need for urgent revascularization in the next 14 days.   Assessment and Plan:   80 year old male with history of ischemic cardiomyopathy s/p CABG, ischemic cardiomyoapthy with LVEF 45-50%, atrial fibrillation, prior mechanical AVR on warfarin presenting with worsening chest pain concerning for unstable angina.  Would prefer expedited invasive approach which I think is reasonable given high risk features.  Plan 1) Unstable angina - Aspirin - Hold warfarin; check INR.  Heparin bridge, ACS nomogram - Continue home rosuvastatin - TTE - NPO for angiogram  #HfmREF #HTN -  Continue home metoprol succiante 25 mg daily - Irbesartan 300 mg daily (sub home valsartan) - Furosemide 40 mg IV lasix x1; mildly hypervolemic suspect hypervolemic hyponatremia  #Angina/Chronic ischemic heart disease - Isosorbide mononitrate - Amlodipine 10 mg daily - Ranolazine 500 mg BID  #History mechanical AVR - TTE as above - Heparin infusion to bridge.  Severity of Illness: The appropriate patient status for this patient is OBSERVATION. Observation status is judged to be reasonable and necessary in order to provide the required intensity of service to ensure the patient's safety. The patient's presenting symptoms, physical exam findings, and initial radiographic and laboratory data in the context of their medical condition is felt to place them at decreased risk for further clinical deterioration. Furthermore, it is anticipated that the patient will be medically stable  for discharge from the hospital within 2 midnights of admission. The following factors support the patient status of observation.  See abvove.  For questions or updates, please contact [place] Please consult [email or web address] for contact info under   Signed, Delight Hoh, MD  11/11/2020 3:46 AM

## 2020-11-11 NOTE — Interval H&P Note (Signed)
History and Physical Interval Note:  11/11/2020 6:23 PM  Erik Meyer  has presented today for surgery, with the diagnosis of known cad with angina.  The various methods of treatment have been discussed with the patient and family. After consideration of risks, benefits and other options for treatment, the patient has consented to  Procedure(s): LEFT HEART CATH AND CORONARY ANGIOGRAPHY (N/A)  PERCUTANEOUS CORONARY INTERVENTION  as a surgical intervention.  The patient's history has been reviewed, patient examined, no change in status, stable for surgery.  I have reviewed the patient's chart and labs.  Questions were answered to the patient's satisfaction.    Cath Lab Visit (complete for each Cath Lab visit)  Clinical Evaluation Leading to the Procedure:   ACS: Yes.    Non-ACS:    Anginal Classification: CCS IV  Anti-ischemic medical therapy: Maximal Therapy (2 or more classes of medications)  Non-Invasive Test Results: Equivocal test results  Prior CABG: Previous CABG     Glenetta Hew

## 2020-11-11 NOTE — H&P (View-Only) (Signed)
Progress Note  Patient Name: Erik Meyer Date of Encounter: 11/11/2020  [facility] Cardiologist: Glenetta Hew, MD   Subjective   Still with mild chest pain this morning.  Inpatient Medications    Scheduled Meds:  amLODipine  10 mg Oral Daily   ampicillin  500 mg Oral QPM   arformoterol  15 mcg Nebulization BID   And   umeclidinium bromide  1 puff Inhalation Daily   aspirin EC  81 mg Oral Daily   [START ON 11/12/2020] furosemide  20 mg Oral Daily   [START ON 11/12/2020] irbesartan  300 mg Oral Daily   LORazepam  0.5 mg Oral TID   metoprolol succinate  25 mg Oral Daily   pantoprazole  40 mg Oral Daily   potassium chloride SA  20 mEq Oral BID   ranolazine  500 mg Oral BID   rosuvastatin  20 mg Oral Daily   tamsulosin  0.8 mg Oral QHS   zonisamide  100 mg Oral QHS   Continuous Infusions:  sodium chloride     Followed by   sodium chloride     heparin 1,300 Units/hr (11/11/20 0415)   PRN Meds: acetaminophen, nitroGLYCERIN, ondansetron (ZOFRAN) IV, sodium chloride flush   Vital Signs    Vitals:   11/10/20 1508 11/11/20 0047 11/11/20 0510 11/11/20 0822  BP:  (!) 145/69 117/67 135/75  Pulse:  78 78 82  Resp:  '14 14 17  ' Temp: 98.4 F (36.9 C) 98 F (36.7 C) 97.6 F (36.4 C) 98 F (36.7 C)  TempSrc: Oral Oral Oral Oral  SpO2:  97% 92% 96%  Weight:  93 kg 93 kg   Height:  '5\' 9"'  (1.753 m)      Intake/Output Summary (Last 24 hours) at 11/11/2020 0840 Last data filed at 11/11/2020 0415 Gross per 24 hour  Intake 66.05 ml  Output 750 ml  Net -683.95 ml   Last 3 Weights 11/11/2020 11/11/2020 10/28/2020  Weight (lbs) 205 lb 1.6 oz 205 lb 1.6 oz 207 lb  Weight (kg) 93.033 kg 93.033 kg 93.895 kg      Telemetry    SR - Personally Reviewed  ECG    SR with nonspecific changes- Personally Reviewed  Physical Exam   GEN: No acute distress.   Neck: No JVD Cardiac: RRR, + mechanical valve click/S2, rubs, or gallops.  Respiratory: Clear to auscultation  bilaterally. GI: Soft, nontender, non-distended  MS: No edema; No deformity. Neuro:  Nonfocal  Psych: Normal affect   Labs    High Sensitivity Troponin:   Recent Labs  Lab 11/05/20 1537 11/05/20 1737 11/10/20 1513 11/10/20 1851  TROPONINIHS '4 4 3 4      ' Chemistry Recent Labs  Lab 11/05/20 1537 11/05/20 1622 11/10/20 1513 11/11/20 0319  NA 128*  --  124* 128*  K 4.2  --  3.6 3.4*  CL 92*  --  93* 98  CO2 25  --  21* 22  GLUCOSE 112*  --  106* 104*  BUN 15  --  8 7*  CREATININE 0.98  --  1.09 0.86  CALCIUM 8.8*  --  8.6* 8.3*  PROT  --  6.3*  --   --   ALBUMIN  --  3.9  --   --   AST  --  19  --   --   ALT  --  17  --   --   ALKPHOS  --  59  --   --  BILITOT  --  0.8  --   --   GFRNONAA >60  --  >60 >60  ANIONGAP 11  --  10 8     Hematology Recent Labs  Lab 11/05/20 1537 11/10/20 1513 11/11/20 0319  WBC 2.0* 1.7* 1.8*  RBC 3.75* 3.52* 3.21*  HGB 12.9* 12.2* 11.3*  HCT 36.8* 33.7* 30.2*  MCV 98.1 95.7 94.1  MCH 34.4* 34.7* 35.2*  MCHC 35.1 36.2* 37.4*  RDW 14.0 13.9 13.7  PLT 121* 135* 120*    BNP Recent Labs  Lab 11/10/20 2216  BNP 134.9*     DDimer No results for input(s): DDIMER in the last 168 hours.   Radiology    DG Chest 2 View  Result Date: 11/10/2020 CLINICAL DATA:  Chest pain. EXAM: CHEST - 2 VIEW COMPARISON:  Radiograph 11/05/2020 FINDINGS: Patient is post median sternotomy. Prosthetic valve again seen. Stable heart size and mediastinal contours. Aortic atherosclerosis. Similar bibasilar atelectasis. There may be a small left pleural effusion. No pulmonary edema. No pneumothorax. No acute osseous abnormalities are seen. IMPRESSION: Similar bibasilar atelectasis to recent exam. Possible small left pleural effusion. Electronically Signed   By: Keith Rake M.D.   On: 11/10/2020 15:35    Cardiac Studies   Echo: pending  Patient Profile     80 y.o. male with PMH of CAD s/p CABG x4/ Bentall with mechanical AVR (on coumadin),  parxosymal Afib, MGUS, HTN, HLD who presented with chest pain. Initially planned for outpatient cath.   Assessment & Plan    Unstable Angina: has been having intermittent episodes over the past couple of weeks. States nitro has not really helped with symptoms. hsTn 3>>4. Still with mild chest pain this morning.  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 No signs of volume overload on exam.  -- on GDMT with BB and ARB -- echo pending  HTN: stable with current regimen  HLD: LDL 54 -- on crestor 55m daily  Hx of mechanical AVR: has been on lovenox bridge with plans for cath -- INR 1.6 this morning  Leukopenia/thrombocytopenia/MGUS: Appears he has been following with hem/oncology through [facility]. Extensive work up, bone marrow biopsy/aspirate 4/19 with no etiology for MGUS. -- last seen 3/22 with recommendations for ETOH cessation, diet and exercise. Follow up in 6 months, has appt in a couple of weeks. Remains high risk to develop multiple myeloma  Hypokalemia: K+ 3.4 -- supplement  Hyponatremia: Na+ 124>>128 -- holding additional lasix this morning -- BMET in am  For questions or updates, please contact [place] consult [email or web address] for contact info under        Signed, LReino Bellis NP  11/11/2020, 8:40 AM    I have personally seen and examined this patient. I agree with the assessment and plan as outlined above. He is admitted with unstable angina. Cardiac cath today. Pt well known to Dr. HEllyn Hack Procedure reviewed with patient.   CLauree Chandler7/10/2020 10:50 AM

## 2020-11-11 NOTE — Progress Notes (Signed)
ANTICOAGULATION CONSULT NOTE  Pharmacy Consult for heparin Indication: chest pain/ACS  Allergies  Allergen Reactions   Doxycycline Rash    Patient Measurements: Height: 5\' 9"  (175.3 cm) Weight: 93 kg (205 lb 1.6 oz) IBW/kg (Calculated) : 70.7 Heparin Dosing Weight: 86kg  Vital Signs: Temp: 98 F (36.7 C) (07/06 1222) Temp Source: Oral (07/06 1222) BP: 138/62 (07/06 1909) Pulse Rate: 69 (07/06 1909)  Labs: Recent Labs    11/10/20 1513 11/10/20 1851 11/11/20 0319 11/11/20 0718  HGB 12.2*  --  11.3*  --   HCT 33.7*  --  30.2*  --   PLT 135*  --  120*  --   LABPROT  --   --  20.4* 19.0*  INR  --   --  1.7* 1.6*  HEPARINUNFRC  --   --   --  0.51  CREATININE 1.09  --  0.86  --   TROPONINIHS 3 4  --   --      Estimated Creatinine Clearance: 79.7 mL/min (by C-G formula based on SCr of 0.86 mg/dL).   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  Patent LIMA-LAD.  SVG-D2 occluded.  RPDA occluded. Seq SVG-r PDA-PL patent to PDA, but the PDA is occluded as is the proximal native PDA   COPD (chronic obstructive pulmonary disease) (HCC)    Diverticulosis    Dyslipidemia, goal LDL below 70    Monitored by PCP. On statin (last labs scan from September 2015: TC 149, TG 127, HDL 55, LDL 69)   ED (erectile dysfunction)    GERD (gastroesophageal reflux disease)    History of SBO (small bowel obstruction) March 2015   Abdominal surgery with LOA   Hx of irritable bowel syndrome    Hypertension, essential    Lesion of right native kidney  08/20/2013   Abdominal US : 2.4 cm x 1.7 complex lesion in the upper pole the right kidney in March of 2012 concerning for RCC although enlarging hemorrhagic cyst could have this appearance as well.,     Monoclonal gammopathy of undetermined significance    Paroxysmal-persistent atrial fibrillation ([place]); CHA2DS2-VASc Score 7 03/23/2017   CHA2DS2-VASc (CHF, HTN, CAD, agex2, TIAx2 = 7)-> Most recent episode January 3-5, 2022; unsuccessful cardioversion with amiodarone; -> went to ER-synchronized DCCV x1   Psoriasis    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 Cyndia BentDeneen Harts procedure - [employer] aVR (25 mm prosthesis) with aortic root conduit;; Echo 10/'14: EF 55-60%, Mod Conc LVH, Gr 1 DD, Well Seated AoV Mech Prosthesis with normal P gradients (no stenosis), Mod-Severe LA dilation --> follow-up echo November 2016: Well functioning mechanical valve. Paradoxical septal motion. EF 50-55%. Severe LA dilation. Moderate RA dilation.    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  Patient s/p cath this evening, noted single vessel cad, no targets for pci. Patient to restart warfarin tonight with lovenox bridge.   Goal of Therapy:  Anti-Xa level 0.6-1.2 units/ml  Monitor platelets by anticoagulation protocol: Yes   Plan:  Warfarin 10mg  tonight Lovenox 1mg /kg q 12 hours to start in am Will f/up in am need for continuation of aspirin  Erin Hearing PharmD., BCPS Clinical Pharmacist 11/11/2020 8:08 PM

## 2020-11-11 NOTE — Progress Notes (Signed)
  Echocardiogram 2D Echocardiogram has been performed.  Michiel Cowboy 11/11/2020, 8:58 AM

## 2020-11-11 NOTE — Progress Notes (Signed)
ANTICOAGULATION CONSULT NOTE  Pharmacy Consult for heparin Indication: chest pain/ACS  Allergies  Allergen Reactions   Doxycycline Rash    Patient Measurements: Height: 5\' 9"  (175.3 cm) Weight: 93 kg (205 lb 1.6 oz) IBW/kg (Calculated) : 70.7 Heparin Dosing Weight: 86kg  Vital Signs: Temp: 98 F (36.7 C) (07/06 0822) Temp Source: Oral (07/06 0822) BP: 135/75 (07/06 0822) Pulse Rate: 82 (07/06 0822)  Labs: Recent Labs    11/10/20 1513 11/10/20 1851 11/11/20 0319 11/11/20 0718  HGB 12.2*  --  11.3*  --   HCT 33.7*  --  30.2*  --   PLT 135*  --  120*  --   LABPROT  --   --  20.4* 19.0*  INR  --   --  1.7* 1.6*  HEPARINUNFRC  --   --   --  0.51  CREATININE 1.09  --  0.86  --   TROPONINIHS 3 4  --   --      Estimated Creatinine Clearance: 79.7 mL/min (by C-G formula based on SCr of 0.86 mg/dL).   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  Patent LIMA-LAD.  SVG-D2 occluded.  RPDA occluded. Seq SVG-r PDA-PL patent to PDA, but the PDA is occluded as is the proximal native PDA   COPD (chronic obstructive pulmonary disease) (HCC)    Diverticulosis    Dyslipidemia, goal LDL below 70    Monitored by PCP. On statin (last labs scan from September 2015: TC 149, TG 127, HDL 55, LDL 69)   ED (erectile dysfunction)    GERD (gastroesophageal reflux disease)    History of SBO (small bowel obstruction) March 2015   Abdominal surgery with LOA   Hx of irritable bowel syndrome    Hypertension, essential    Lesion of right native kidney  08/20/2013   Abdominal US : 2.4 cm x 1.7 complex lesion in the upper pole the right kidney in March of 2012 concerning for RCC although enlarging hemorrhagic cyst could have this appearance as well.,     Monoclonal gammopathy of undetermined significance    Paroxysmal-persistent atrial fibrillation ([place]); CHA2DS2-VASc Score 7 03/23/2017   CHA2DS2-VASc (CHF, HTN, CAD, agex2, TIAx2 = 7)-> Most recent episode January 3-5, 2022; unsuccessful cardioversion with amiodarone; -> went to ER-synchronized DCCV x1   Psoriasis    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 Cyndia BentDeneen Harts procedure - [employer] Jude aVR (25 mm prosthesis) with aortic root conduit;; Echo 10/'14: EF 55-60%, Mod Conc LVH, Gr 1 DD, Well Seated AoV Mech Prosthesis with normal P gradients (no stenosis), Mod-Severe LA dilation --> follow-up echo November 2016: Well functioning mechanical valve. Paradoxical septal motion. EF 50-55%. Severe LA dilation. Moderate RA dilation.    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  Heparin level came back therapeutic at 0.51, on 1300 units/hr. Hgb 11.3, plt 120. No s/sx of bleeding or infusion issues. Plan for cath today.   Goal of Therapy:  Heparin level 0.3-0.7 units/ml Monitor platelets by anticoagulation protocol: Yes   Plan:  Continue heparin infusion at 1300 units/hr F/u after cath F/u ability to restart warfarin Monitor daily HL, INR, CBC, and for s/sx of bleeding   Antonietta Jewel, PharmD, [place] Pharmacist  Phone: [phone] 11/11/2020 8:32 AM  Please check AMION for all [place] phone numbers After 10:00 PM, call [place] [phone]

## 2020-11-11 NOTE — Progress Notes (Addendum)
Progress Note  Patient Name: Erik Meyer Date of Encounter: 11/11/2020  [facility] Cardiologist: Glenetta Hew, MD   Subjective   Still with mild chest pain this morning.  Inpatient Medications    Scheduled Meds:  amLODipine  10 mg Oral Daily   ampicillin  500 mg Oral QPM   arformoterol  15 mcg Nebulization BID   And   umeclidinium bromide  1 puff Inhalation Daily   aspirin EC  81 mg Oral Daily   [START ON 11/12/2020] furosemide  20 mg Oral Daily   [START ON 11/12/2020] irbesartan  300 mg Oral Daily   LORazepam  0.5 mg Oral TID   metoprolol succinate  25 mg Oral Daily   pantoprazole  40 mg Oral Daily   potassium chloride SA  20 mEq Oral BID   ranolazine  500 mg Oral BID   rosuvastatin  20 mg Oral Daily   tamsulosin  0.8 mg Oral QHS   zonisamide  100 mg Oral QHS   Continuous Infusions:  sodium chloride     Followed by   sodium chloride     heparin 1,300 Units/hr (11/11/20 0415)   PRN Meds: acetaminophen, nitroGLYCERIN, ondansetron (ZOFRAN) IV, sodium chloride flush   Vital Signs    Vitals:   11/10/20 1508 11/11/20 0047 11/11/20 0510 11/11/20 0822  BP:  (!) 145/69 117/67 135/75  Pulse:  78 78 82  Resp:  '14 14 17  ' Temp: 98.4 F (36.9 C) 98 F (36.7 C) 97.6 F (36.4 C) 98 F (36.7 C)  TempSrc: Oral Oral Oral Oral  SpO2:  97% 92% 96%  Weight:  93 kg 93 kg   Height:  '5\' 9"'  (1.753 m)      Intake/Output Summary (Last 24 hours) at 11/11/2020 0840 Last data filed at 11/11/2020 0415 Gross per 24 hour  Intake 66.05 ml  Output 750 ml  Net -683.95 ml   Last 3 Weights 11/11/2020 11/11/2020 10/28/2020  Weight (lbs) 205 lb 1.6 oz 205 lb 1.6 oz 207 lb  Weight (kg) 93.033 kg 93.033 kg 93.895 kg      Telemetry    SR - Personally Reviewed  ECG    SR with nonspecific changes- Personally Reviewed  Physical Exam   GEN: No acute distress.   Neck: No JVD Cardiac: RRR, + mechanical valve click/S2, rubs, or gallops.  Respiratory: Clear to auscultation  bilaterally. GI: Soft, nontender, non-distended  MS: No edema; No deformity. Neuro:  Nonfocal  Psych: Normal affect   Labs    High Sensitivity Troponin:   Recent Labs  Lab 11/05/20 1537 11/05/20 1737 11/10/20 1513 11/10/20 1851  TROPONINIHS '4 4 3 4      ' Chemistry Recent Labs  Lab 11/05/20 1537 11/05/20 1622 11/10/20 1513 11/11/20 0319  NA 128*  --  124* 128*  K 4.2  --  3.6 3.4*  CL 92*  --  93* 98  CO2 25  --  21* 22  GLUCOSE 112*  --  106* 104*  BUN 15  --  8 7*  CREATININE 0.98  --  1.09 0.86  CALCIUM 8.8*  --  8.6* 8.3*  PROT  --  6.3*  --   --   ALBUMIN  --  3.9  --   --   AST  --  19  --   --   ALT  --  17  --   --   ALKPHOS  --  59  --   --  BILITOT  --  0.8  --   --   GFRNONAA >60  --  >60 >60  ANIONGAP 11  --  10 8     Hematology Recent Labs  Lab 11/05/20 1537 11/10/20 1513 11/11/20 0319  WBC 2.0* 1.7* 1.8*  RBC 3.75* 3.52* 3.21*  HGB 12.9* 12.2* 11.3*  HCT 36.8* 33.7* 30.2*  MCV 98.1 95.7 94.1  MCH 34.4* 34.7* 35.2*  MCHC 35.1 36.2* 37.4*  RDW 14.0 13.9 13.7  PLT 121* 135* 120*    BNP Recent Labs  Lab 11/10/20 2216  BNP 134.9*     DDimer No results for input(s): DDIMER in the last 168 hours.   Radiology    DG Chest 2 View  Result Date: 11/10/2020 CLINICAL DATA:  Chest pain. EXAM: CHEST - 2 VIEW COMPARISON:  Radiograph 11/05/2020 FINDINGS: Patient is post median sternotomy. Prosthetic valve again seen. Stable heart size and mediastinal contours. Aortic atherosclerosis. Similar bibasilar atelectasis. There may be a small left pleural effusion. No pulmonary edema. No pneumothorax. No acute osseous abnormalities are seen. IMPRESSION: Similar bibasilar atelectasis to recent exam. Possible small left pleural effusion. Electronically Signed   By: Keith Rake M.D.   On: 11/10/2020 15:35    Cardiac Studies   Echo: pending  Patient Profile     80 y.o. male with PMH of CAD s/p CABG x4/ [place] with mechanical AVR (on coumadin),  parxosymal Afib, MGUS, HTN, HLD who presented with chest pain. Initially planned for outpatient cath.   Assessment & Plan    Unstable Angina: has been having intermittent episodes over the past couple of weeks. States nitro has not really helped with symptoms. hsTn 3>>4. Still with mild chest pain this morning.  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 No signs of volume overload on exam.  -- on GDMT with BB and ARB -- echo pending  HTN: stable with current regimen  HLD: LDL 54 -- on crestor 79m daily  Hx of mechanical AVR: has been on lovenox bridge with plans for cath -- INR 1.6 this morning  Leukopenia/thrombocytopenia/MGUS: Appears he has been following with hem/oncology through [facility]. Extensive work up, bone marrow biopsy/aspirate 4/19 with no etiology for MGUS. -- last seen 3/22 with recommendations for ETOH cessation, diet and exercise. Follow up in 6 months, has appt in a couple of weeks. Remains high risk to develop multiple myeloma  Hypokalemia: K+ 3.4 -- supplement  Hyponatremia: Na+ 124>>128 -- holding additional lasix this morning -- BMET in am  For questions or updates, please contact CGeronimoPlease consult [email or web address] for contact info under        Signed, LReino Bellis NP  11/11/2020, 8:40 AM    I have personally seen and examined this patient. I agree with the assessment and plan as outlined above. He is admitted with unstable angina. Cardiac cath today. Pt well known to Dr. HEllyn Hack Procedure reviewed with patient.   CLauree Chandler7/10/2020 10:50 AM

## 2020-11-11 NOTE — Care Management Obs Status (Signed)
[place] NOTIFICATION   Patient Details  Name: JASTIN FORE MRN: [id number] Date of Birth: 12-08-1940   Medicare Observation Status Notification Given:  Yes    Bethena Roys, RN 11/11/2020, 2:24 PM

## 2020-11-12 ENCOUNTER — Other Ambulatory Visit (HOSPITAL_COMMUNITY): Payer: Self-pay

## 2020-11-12 ENCOUNTER — Encounter (HOSPITAL_COMMUNITY): Payer: Self-pay | Admitting: Cardiology

## 2020-11-12 ENCOUNTER — Other Ambulatory Visit: Payer: Self-pay

## 2020-11-12 DIAGNOSIS — I200 Unstable angina: Secondary | ICD-10-CM | POA: Diagnosis not present

## 2020-11-12 DIAGNOSIS — I480 Paroxysmal atrial fibrillation: Secondary | ICD-10-CM

## 2020-11-12 LAB — CBC
HCT: 31.7 % — ABNORMAL LOW (ref 39.0–52.0)
Hemoglobin: 11.3 g/dL — ABNORMAL LOW (ref 13.0–17.0)
MCH: 34.8 pg — ABNORMAL HIGH (ref 26.0–34.0)
MCHC: 35.6 g/dL (ref 30.0–36.0)
MCV: 97.5 fL (ref 80.0–100.0)
Platelets: 120 10*3/uL — ABNORMAL LOW (ref 150–400)
RBC: 3.25 MIL/uL — ABNORMAL LOW (ref 4.22–5.81)
RDW: 13.8 % (ref 11.5–15.5)
WBC: 1.6 10*3/uL — ABNORMAL LOW (ref 4.0–10.5)
nRBC: 0 % (ref 0.0–0.2)

## 2020-11-12 LAB — BASIC METABOLIC PANEL
Anion gap: 6 (ref 5–15)
BUN: 7 mg/dL — ABNORMAL LOW (ref 8–23)
CO2: 22 mmol/L (ref 22–32)
Calcium: 8.2 mg/dL — ABNORMAL LOW (ref 8.9–10.3)
Chloride: 100 mmol/L (ref 98–111)
Creatinine, Ser: 0.87 mg/dL (ref 0.61–1.24)
GFR, Estimated: >60 mL/min (ref >60)
Glucose, Bld: 88 mg/dL (ref 70–99)
Potassium: 3.7 mmol/L (ref 3.5–5.1)
Sodium: 128 mmol/L — ABNORMAL LOW (ref 135–145)

## 2020-11-12 LAB — PROTIME-INR
INR: 1.4 — ABNORMAL HIGH (ref 0.8–1.2)
Prothrombin Time: 17.5 seconds — ABNORMAL HIGH (ref 11.4–15.2)

## 2020-11-12 MED ORDER — SODIUM CHLORIDE 0.9 % IV SOLN: 250.0000 mL | INTRAVENOUS | Status: DC | PRN

## 2020-11-12 MED ORDER — WARFARIN SODIUM 5 MG PO TABS: 7.5000 mg | ORAL_TABLET | ORAL | Status: DC

## 2020-11-12 MED ORDER — HYDRALAZINE HCL 20 MG/ML IJ SOLN: 10.0000 mg | INTRAMUSCULAR | Status: AC | PRN

## 2020-11-12 MED ORDER — SODIUM CHLORIDE 0.9 % IV SOLN: INTRAVENOUS | Status: DC

## 2020-11-12 MED ORDER — SODIUM CHLORIDE 0.9% FLUSH
3.0000 mL | Freq: Two times a day (BID) | INTRAVENOUS | Status: DC
  Administered 2020-11-12: 3 mL via INTRAVENOUS

## 2020-11-12 MED ORDER — AMIODARONE HCL 200 MG PO TABS: 400.0000 mg | ORAL_TABLET | Freq: Two times a day (BID) | ORAL | Status: DC | PRN

## 2020-11-12 MED ORDER — ASPIRIN 81 MG PO TBEC: 81.0000 mg | DELAYED_RELEASE_TABLET | Freq: Every day | ORAL | 0 refills | Status: DC

## 2020-11-12 MED ORDER — MORPHINE SULFATE (PF) 2 MG/ML IV SOLN
2.0000 mg | INTRAVENOUS | Status: DC | PRN
Start: 2020-11-12 — End: 2020-11-12

## 2020-11-12 MED ORDER — WARFARIN SODIUM 7.5 MG PO TABS
12.5000 mg | ORAL_TABLET | Freq: Once | ORAL | Status: AC
  Administered 2020-11-12: 12.5 mg via ORAL
  Filled 2020-11-12: qty 1

## 2020-11-12 MED ORDER — SODIUM CHLORIDE 0.9% FLUSH: 3.0000 mL | INTRAVENOUS | Status: DC | PRN

## 2020-11-12 MED ORDER — COLCHICINE 0.6 MG PO TABS
0.6000 mg | ORAL_TABLET | Freq: Every day | ORAL | Status: DC
  Administered 2020-11-12: 0.6 mg via ORAL
  Filled 2020-11-12: qty 1

## 2020-11-12 MED ORDER — COLCHICINE 0.6 MG PO CAPS
0.6000 mg | ORAL_CAPSULE | Freq: Every day | ORAL | 0 refills | Status: DC
Start: 2020-11-12 — End: 2022-04-20
  Filled 2020-11-12: qty 30, 30d supply, fill #0

## 2020-11-12 MED ORDER — ENOXAPARIN SODIUM 100 MG/ML IJ SOSY
1.0000 mg/kg | PREFILLED_SYRINGE | Freq: Two times a day (BID) | INTRAMUSCULAR | Status: DC
  Administered 2020-11-12: 92.5 mg via SUBCUTANEOUS
  Filled 2020-11-12: qty 1

## 2020-11-12 MED ORDER — LABETALOL HCL 5 MG/ML IV SOLN: 10.0000 mg | INTRAVENOUS | Status: AC | PRN

## 2020-11-12 NOTE — Discharge Summary (Addendum)
Discharge Summary    Patient ID: Erik Meyer MRN: [id number]; DOB: 06-11-1940  Admit date: 11/10/2020 Discharge date: 11/12/2020  PCP:  Ivan Anchors, MD   [facility] Providers Cardiologist:  Glenetta Hew, MD     Discharge Diagnoses    Principal Problem:   Unstable angina [facility]) Active Problems:   S/P AVR (aortic valve replacement) and aortoplasty   Dyslipidemia, goal LDL below 70   Hypertension, essential   S/P CABG x 4   Paroxysmal-persistent atrial fibrillation (HCC); CHA2DS2-VASc Score 4   Nonspecific chest pain    Diagnostic Studies/Procedures    Cath: 11/11/20  Prox LAD lesion is 65% stenosed. Mid LAD-1 lesion is 65% stenosed. Stable in appearance from previous cath Mid LAD-2 lesion is 100% stenosed -> at LIMA graft insertion site with better flow Ost RPDA lesion is 100% stenosed. ---GRAFTS--- LIMA graft was visualized by angiography and is moderate in size with no notable disease.. There is competitive flow at the insertion site. Seq SVG- rPDA-PL graft was not visualized due to known occlusion. SVG-DIAG graft was visualized by angiography and is 100% stenosed @ the origin. 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 Large-caliber, patulous RCA with expansive PDA and PL covering a significant portion of the circumflex distribution.  Streaming flow noted, but no significant lesions. Mechanical aortic valve not crossed.     RECOMMENDATIONS Need to evaluate for other causes of chest pain-consider possible pericarditis-he does mention that is worse with lying down.  2D echocardiogram pending.  Potentially consider colchicine and NSAIDs. Otherwise continue GM DT   Glenetta Hew, MD   Diagnostic Dominance: Right    Echo: 11/11/20  IMPRESSIONS     1. Left ventricular ejection  fraction, by estimation, is 45 to 50%. The  left ventricle has mildly decreased function. The left ventricle  demonstrates regional wall motion abnormalities (see scoring  diagram/findings for description). The left ventricular   internal cavity size was mildly dilated. Left ventricular diastolic  parameters are indeterminate.   2. Right ventricular systolic function is moderately reduced. The right  ventricular size is moderately enlarged. There is normal pulmonary artery  systolic pressure.   3. Left atrial size was moderately dilated.   4. Right atrial size was moderately dilated.   5. The mitral valve is normal in structure. Trivial mitral valve  regurgitation. No evidence of mitral stenosis. Moderate mitral annular  calcification.   6. The aortic valve has been repaired/replaced. Aortic valve  regurgitation is not visualized. There is a 25 mm bileaflet valve present  in the aortic position. Procedure Date: 12/2002. Echo findings are  consistent with normal structure and function of  the aortic valve prosthesis. Aortic valve mean gradient measures 6.0 mmHg.   7. The inferior vena cava is normal in size with greater than 50%  respiratory variability, suggesting right atrial pressure of 3 mmHg.   Comparison(s): Prior images reviewed side by side.   Conclusion(s)/Recommendation(s): LVEF similar, with wall motion  abnormalities predominantly inferolateral, but there is also a small area  of the anteroseptum with focal WMA. On review, RV borderline enlarged on  prior study, appears moderately enlarged on   current study. Mechanical AVR without significant abnormalities.  _____________   History of Present Illness     Erik Meyer is a 80 y.o. male  with CV history including prior CABG x4,  AVR, and pAF who presented to ED with complaint of chest pain.  He had been having worsening chest pain of late with minimal exertion and underwent vasodilator MPI recently which showed separate  defects in inferiorlateral and anterior/anteroapical distributions consistent with prior infarct and peri-infarct ischemia.  Given ongoing symptoms he had been planned for invasive angiogram later this week.  His symptoms were worsening and starting to involve more consistent episodes of chest pain at rest so he presented to ED.   ECG presonally reviewed NSR with prior septal infarct and non-specific ST changes unchanged form prior.  Notably pancyptopenic.   Troponin 3, 4 on serial measurement.  BNP 135.  Sodium 124 with known chronic hyponatremia.   Of note, he had been holding warfarin with lovenox bridge in anticipation of invasive angiogram since last Friday.  He was admitted to cardiology for further management with plans for cath.    Hospital Course    Unstable Angina: hsTn 3>>4. Underwent cardiac cath noted above with Dr. Ellyn Hack noted above with no culprit lesion noted. Recommendations for treatment of pericarditis.  -- on ASA, statin, BB, ARB -- start colchicine 0.2m daily for several weeks, re-evaluate at follow up appt on 8/1   HFrEF: EF 45-50% with inferolateral hypokinesis and small area of focal WMA in anteroseptum. No signs of volume overload on exam. -- on GDMT with BB and ARB   HTN: stable -- continue norvasc 154mdaily, Toprol Xl 255maily, valsartan 320m3mily   HLD: LDL 54 -- on crestor 20mg78mly   Hx of mechanical AVR: was on lovenox bridge with plans for cath PTA.  -- INR 1.4 at discharge -- discussed with PharmD regarding lovenox/coumadin bridge at discharge. Will resume previous home dosing with subq injections with INR check 7/13.   Leukopenia/thrombocytopenia/MGUS: Appears he has been following with hem/oncology through [facility]. Extensive work up, bone marrow biopsy/aspirate 4/19 with no etiology for MGUS. -- last seen 3/22 with recommendations for ETOH cessation, diet and exercise. Follow up in 6 months, has appt in a couple of weeks. Remains high risk to  develop multiple myeloma   Hypokalemia: K+ 3.7 at discharge   Hyponatremia: Na+ 124>>128  Paroxsymal Afib: remained in SR during admission -- on coumadin  General: Well developed, well nourished, male appearing in no acute distress. Head: Normocephalic, atraumatic.  Neck: Supple without bruits, JVD. Lungs:  Resp regular and unlabored, CTA. Heart: RRR, S1, S2, + mechanical valve click; no rub. Abdomen: Soft, non-tender, non-distended with normoactive bowel sounds. No hepatomegaly. No rebound/guarding. No obvious abdominal masses. Extremities: No clubbing, cyanosis, edema. Distal pedal pulses are 2+ bilaterally. Left radial cath site stable without bruising or hematoma Neuro: Alert and oriented X 3. Moves all extremities spontaneously. Psych: Normal affect.   Patient was seen by Dr. McAlhAngelena Formdeemed stable for discharge home. Follow up in the office has been arranged.   Did the patient have an acute coronary syndrome (MI, NSTEMI, STEMI, etc) this admission?:  No                               Did the patient have a percutaneous coronary intervention (stent / angioplasty)?:  No.       _____________  Discharge Vitals Blood pressure (!) 130/52, pulse 78, temperature 97.6 F (36.4 C), temperature source Oral, resp. rate 16, height _0  (1.753 m), weight 91.6 kg, SpO2 96 %.  [place]   11/11/20 0047  11/11/20 0510 11/12/20 0516  Weight: 93 kg 93 kg 91.6 kg    Labs & Radiologic Studies    CBC Recent Labs    11/11/20 0319 11/12/20 0452  WBC 1.8* 1.6*  HGB 11.3* 11.3*  HCT 30.2* 31.7*  MCV 94.1 97.5  PLT 120* 161*   Basic Metabolic Panel Recent Labs    11/11/20 0319 11/12/20 0452  NA 128* 128*  K 3.4* 3.7  CL 98 100  CO2 22 22  GLUCOSE 104* 88  BUN 7* 7*  CREATININE 0.86 0.87  CALCIUM 8.3* 8.2*   Liver Function Tests No results for input(s): AST, ALT, ALKPHOS, BILITOT, PROT, ALBUMIN in the last 72 hours. No results for input(s): LIPASE, AMYLASE in the  last 72 hours. High Sensitivity Troponin:   Recent Labs  Lab 11/05/20 1537 11/05/20 1737 11/10/20 1513 11/10/20 1851  TROPONINIHS _0 BNP Invalid input(s): POCBNP D-Dimer No results for input(s): DDIMER in the last 72 hours. Hemoglobin A1C No results for input(s): HGBA1C in the last 72 hours. Fasting Lipid Panel Recent Labs    11/11/20 0319  CHOL 132  HDL 65  LDLCALC 54  TRIG 65  CHOLHDL 2.0   Thyroid Function Tests No results for input(s): TSH, T4TOTAL, T3FREE, THYROIDAB in the last 72 hours.  Invalid input(s): FREET3 _____________  DG Chest 2 View  Result Date: 11/10/2020 CLINICAL DATA:  Chest pain. EXAM: CHEST - 2 VIEW COMPARISON:  Radiograph 11/05/2020 FINDINGS: Patient is post median sternotomy. Prosthetic valve again seen. Stable heart size and mediastinal contours. Aortic atherosclerosis. Similar bibasilar atelectasis. There may be a small left pleural effusion. No pulmonary edema. No pneumothorax. No acute osseous abnormalities are seen. IMPRESSION: Similar bibasilar atelectasis to recent exam. Possible small left pleural effusion. Electronically Signed   By: Keith Rake M.D.   On: 11/10/2020 15:35   DG Chest 2 View  Result Date: 11/05/2020 CLINICAL DATA:  Chest pain. EXAM: CHEST - 2 VIEW COMPARISON:  May 13, 2020. FINDINGS: Status post cardiac valve repair. Normal cardiac size. No pneumothorax is noted. Minimal bibasilar subsegmental atelectasis is noted. Bony thorax is unremarkable. IMPRESSION: Minimal bibasilar subsegmental atelectasis. Aortic Atherosclerosis (ICD10-I70.0). Electronically Signed   By: Marijo Conception M.D.   On: 11/05/2020 16:09   CARDIAC CATHETERIZATION  Result Date: 11/11/2020  Prox LAD lesion is 65% stenosed. Mid LAD-1 lesion is 65% stenosed. Stable in appearance from previous cath  Mid LAD-2 lesion is 100% stenosed -> at LIMA graft insertion site with better flow  Ost RPDA lesion is 100% stenosed.  ---GRAFTS---  LIMA graft was  visualized by angiography and is moderate in size with no notable disease.. There is competitive flow at the insertion site.  Seq SVG- rPDA-PL graft was not visualized due to known occlusion.  SVG-DIAG graft was visualized by angiography and is 100% stenosed @ the origin.  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  Large-caliber, patulous RCA with expansive PDA and PL covering a significant portion of the circumflex distribution.  Streaming flow noted, but no significant lesions.  Mechanical aortic valve not crossed. RECOMMENDATIONS  Need to evaluate for other causes of chest pain-consider possible pericarditis-he does mention that is worse with lying down.  2D echocardiogram pending.  Potentially consider colchicine and NSAIDs.  Otherwise continue GM DT Glenetta Hew, MD  MYOCARDIAL PERFUSION  IMAGING  Result Date: 10/28/2020  The left ventricular ejection fraction is mildly decreased (45-54%).  Nuclear stress EF: 48%.  There was no ST segment deviation noted during stress.  No T wave inversion was noted during stress.  Defect 1: There is a medium defect of severe severity present in the basal inferolateral and mid inferolateral location.  Defect 2: There is a medium defect of moderate severity present in the mid anterior, apical anterior and apex location.  Findings consistent with prior myocardial infarction with peri-infarct ischemia.  This is an intermediate risk study.  Intermediate risk stress test, with EF mildly reduced and wall motion abnormalities as noted. There are two defects that are similar to prior; the inferolateral defect appears fixed and consistent with scar, and the anterior defect is consistent with infarct with peri-infarct ischemia.   ECHOCARDIOGRAM COMPLETE  Result Date: 11/11/2020    ECHOCARDIOGRAM  REPORT   Patient Name:   Erik Meyer Date of Exam: 11/11/2020 Medical Rec #:  [id number]         Height:       69.0 in Accession #:    [id number]        Weight:       205.1 lb Date of Birth:  1940-10-12          BSA:          2.088 m Patient Age:    63 years          BP:           117/67 mmHg Patient Gender: M                 HR:           78 bpm. Exam Location:  Inpatient Procedure: 2D Echo, Cardiac Doppler and Color Doppler Indications:    Acute myocardial infarction, unspecified I21.9  History:        Patient has prior history of Echocardiogram examinations, most                 recent 11/27/2018. Prior CABG, COPD, Arrythmias:Atrial                 Fibrillation; Risk Factors:Hypertension, Dyslipidemia and Former                 Smoker. GERD. Bentall procedure.                 Aortic Valve: 25 mm bileaflet valve is present in the aortic                 position. Procedure Date: 12/2002.  Sonographer:    Vickie Epley RDCS Referring Phys: [id number] [place]  1. Left ventricular ejection fraction, by estimation, is 45 to 50%. The left ventricle has mildly decreased function. The left ventricle demonstrates regional wall motion abnormalities (see scoring diagram/findings for description). The left ventricular  internal cavity size was mildly dilated. Left ventricular diastolic parameters are indeterminate.  2. Right ventricular systolic function is moderately reduced. The right ventricular size is moderately enlarged. There is normal pulmonary artery systolic pressure.  3. Left atrial size was moderately dilated.  4. Right atrial size was moderately dilated.  5. The mitral valve is normal in structure. Trivial mitral valve regurgitation. No evidence of mitral stenosis. Moderate mitral annular calcification.  6. The aortic valve has been repaired/replaced. Aortic valve regurgitation is not visualized. There is a 25 mm bileaflet valve present in the aortic position. Procedure Date:  12/2002. Echo findings  are consistent with normal structure and function of the aortic valve prosthesis. Aortic valve mean gradient measures 6.0 mmHg.  7. The inferior vena cava is normal in size with greater than 50% respiratory variability, suggesting right atrial pressure of 3 mmHg. Comparison(s): Prior images reviewed side by side. Conclusion(s)/Recommendation(s): LVEF similar, with wall motion abnormalities predominantly inferolateral, but there is also a small area of the anteroseptum with focal WMA. On review, RV borderline enlarged on prior study, appears moderately enlarged on  current study. Mechanical AVR without significant abnormalities. FINDINGS  Left Ventricle: Left ventricular ejection fraction, by estimation, is 45 to 50%. The left ventricle has mildly decreased function. The left ventricle demonstrates regional wall motion abnormalities. The left ventricular internal cavity size was mildly dilated. There is no left ventricular hypertrophy. Left ventricular diastolic parameters are indeterminate.  LV Wall Scoring: The mid and distal lateral wall, posterior wall, mid anteroseptal segment, mid anterolateral segment, and apex are hypokinetic. The entire anterior wall, inferior septum, entire inferior wall, basal anteroseptal segment, and basal anterolateral segment are normal. Right Ventricle: The right ventricular size is moderately enlarged. Right vetricular wall thickness was not well visualized. Right ventricular systolic function is moderately reduced. There is normal pulmonary artery systolic pressure. The tricuspid regurgitant velocity is 2.35 m/s, and with an assumed right atrial pressure of 3 mmHg, the estimated right ventricular systolic pressure is 48.1 mmHg. Left Atrium: Left atrial size was moderately dilated. Right Atrium: Right atrial size was moderately dilated. Pericardium: There is no evidence of pericardial effusion. Mitral Valve: The mitral valve is normal in structure. There is mild thickening of the  mitral valve leaflet(s). There is mild calcification of the mitral valve leaflet(s). Moderate mitral annular calcification. Trivial mitral valve regurgitation. No evidence of mitral valve stenosis. Tricuspid Valve: The tricuspid valve is normal in structure. Tricuspid valve regurgitation is trivial. No evidence of tricuspid stenosis. Aortic Valve: The aortic valve has been repaired/replaced. Aortic valve regurgitation is not visualized. Aortic valve mean gradient measures 6.0 mmHg. Aortic valve peak gradient measures 11.5 mmHg. Aortic valve area, by VTI measures 2.01 cm. There is a 25 mm bileaflet valve present in the aortic position. Procedure Date: 12/2002. Echo findings are consistent with normal structure and function of the aortic valve prosthesis. Pulmonic Valve: The pulmonic valve was grossly normal. Pulmonic valve regurgitation is trivial. No evidence of pulmonic stenosis. Aorta: The aortic root, ascending aorta, aortic arch and descending aorta are all structurally normal, with no evidence of dilitation or obstruction. Venous: The inferior vena cava is normal in size with greater than 50% respiratory variability, suggesting right atrial pressure of 3 mmHg. IAS/Shunts: The atrial septum is grossly normal.  LEFT VENTRICLE PLAX 2D LVIDd:         5.20 cm LVIDs:         4.30 cm LV PW:         1.00 cm LV IVS:        1.10 cm LVOT diam:     2.50 cm LV SV:         65 LV SV Index:   31 LVOT Area:     4.91 cm  LV Volumes (MOD) LV vol d, MOD A2C: 120.0 ml LV vol d, MOD A4C: 118.0 ml LV vol s, MOD A2C: 43.3 ml LV vol s, MOD A4C: 54.2 ml LV SV MOD A2C:     76.7 ml LV SV MOD A4C:     118.0 ml LV SV MOD BP:  74.0 ml RIGHT VENTRICLE RV S prime:     5.33 cm/s TAPSE (M-mode): 1.4 cm LEFT ATRIUM           Index       RIGHT ATRIUM           Index LA diam:      6.70 cm 3.21 cm/m  RA Area:     24.50 cm LA Vol (A2C): 73.8 ml 35.34 ml/m RA Volume:   81.20 ml  38.88 ml/m LA Vol (A4C): 83.8 ml 40.13 ml/m  AORTIC VALVE AV  Area (Vmax):    1.73 cm AV Area (Vmean):   1.75 cm AV Area (VTI):     2.01 cm AV Vmax:           169.50 cm/s AV Vmean:          115.000 cm/s AV VTI:            0.323 m AV Peak Grad:      11.5 mmHg AV Mean Grad:      6.0 mmHg LVOT Vmax:         59.60 cm/s LVOT Vmean:        41.000 cm/s LVOT VTI:          0.132 m LVOT/AV VTI ratio: 0.41  AORTA Ao Asc diam:  3.00 cm Ao Desc diam: 2.70 cm TRICUSPID VALVE TR Peak grad:   22.1 mmHg TR Vmax:        235.00 cm/s  SHUNTS Systemic VTI:  0.13 m Systemic Diam: 2.50 cm Buford Dresser MD Electronically signed by Buford Dresser MD Signature Date/Time: 11/11/2020/11:35:10 AM    Final    Disposition   Pt is being discharged home today in good condition.  Follow-up Plans & Appointments     Follow-up Information     Leonie Man, MD Follow up on 12/07/2020.   Specialty: Cardiology Why: at Contact information: [address] [place] [address] [phone]                Discharge Instructions     Call MD for:  difficulty breathing, headache or visual disturbances   Complete by: As directed    Call MD for:  redness, tenderness, or signs of infection (pain, swelling, redness, odor or green/yellow discharge around incision site)   Complete by: As directed    Diet - low sodium heart healthy   Complete by: As directed    Discharge instructions   Complete by: As directed    Radial Site Care Refer to this sheet in the next few weeks. These instructions provide you with information on caring for yourself after your procedure. Your caregiver may also give you more specific instructions. Your treatment has been planned according to current medical practices, but problems sometimes occur. Call your caregiver if you have any problems or questions after your procedure. HOME CARE INSTRUCTIONS You may shower the day after the procedure. Remove the bandage (dressing) and gently wash the site with plain soap and water. Gently pat  the site dry.  Do not apply powder or lotion to the site.  Do not submerge the affected site in water for 3 to 5 days.  Inspect the site at least twice daily.  Do not flex or bend the affected arm for 24 hours.  No lifting over 5 pounds (2.3 kg) for 5 days after your procedure.  Do not drive home if you are discharged the same day of the procedure. Have someone  else drive you.  You may drive 24 hours after the procedure unless otherwise instructed by your caregiver.  What to expect: Any bruising will usually fade within 1 to 2 weeks.  Blood that collects in the tissue (hematoma) may be painful to the touch. It should usually decrease in size and tenderness within 1 to 2 weeks.  SEEK IMMEDIATE MEDICAL CARE IF: You have unusual pain at the radial site.  You have redness, warmth, swelling, or pain at the radial site.  You have drainage (other than a small amount of blood on the dressing).  You have chills.  You have a fever or persistent symptoms for more than 72 hours.  You have a fever and your symptoms suddenly get worse.  Your arm becomes pale, cool, tingly, or numb.  You have heavy bleeding from the site. Hold pressure on the site.   Increase activity slowly   Complete by: As directed        Discharge Medications   Allergies as of 11/12/2020       Reactions   Doxycycline Rash        Medication List     TAKE these medications    amiodarone 200 MG tablet Commonly known as: PACERONE Take 2 tablets (400 mg total) by mouth 2 (two) times daily as needed (atrial fibrillation).   amLODipine 10 MG tablet Commonly known as: NORVASC Take 1 tablet (10 mg total) by mouth daily. What changed: when to take this   ampicillin 500 MG capsule Commonly known as: PRINCIPEN Take 500 mg by mouth every evening.   aspirin 81 MG EC tablet Take 1 tablet (81 mg total) by mouth daily. Swallow whole. Start taking on: November 13, 2020   betamethasone dipropionate 0.05 % ointment Commonly  known as: DIPROLENE Apply 1 application topically 2 (two) times daily as needed (psoriasis).   Colchicine 0.6 MG Caps Commonly known as: Mitigare Take 0.6 mg by mouth daily.   Denta 5000 Plus 1.1 % Crea dental cream Generic drug: sodium fluoride Place 1 application onto teeth at bedtime.   enoxaparin 100 MG/ML injection Commonly known as: LOVENOX Inject 1 mL (100 mg total) into the skin every 12 (twelve) hours.   esomeprazole 20 MG capsule Commonly known as: NEXIUM Take 20 mg by mouth in the morning.   ferrous sulfate 325 (65 FE) MG tablet Take 325 mg by mouth every evening.   furosemide 20 MG tablet Commonly known as: LASIX TAKE 1 TO 2 TABLETS EVERY DAY IF NEEDED What changed: See the new instructions.   LORazepam 0.5 MG tablet Commonly known as: ATIVAN Take 0.5 mg by mouth in the morning, at noon, and at bedtime.   metoprolol succinate 25 MG 24 hr tablet Commonly known as: TOPROL-XL TAKE 1 TABLET EVERY DAY What changed: when to take this   metroNIDAZOLE 1 % gel Commonly known as: METROGEL Apply 1 application topically daily as needed (roasacea).   nitroGLYCERIN 0.4 MG SL tablet Commonly known as: NITROSTAT Place 1 tablet (0.4 mg total) under the tongue every 5 (five) minutes as needed for chest pain. What changed: when to take this   polyvinyl alcohol 1.4 % ophthalmic solution Commonly known as: LIQUIFILM TEARS Place 1-2 drops into both eyes 3 (three) times daily as needed for dry eyes.   potassium chloride SA 20 MEQ tablet Commonly known as: KLOR-CON TAKE 1 TABLET TWICE DAILY What changed: when to take this   RA Vitamin B12 2000 MCG Tbcr Generic drug: Cyanocobalamin Take  2,000 mcg by mouth in the morning.   ranolazine 500 MG 12 hr tablet Commonly known as: RANEXA TAKE 1 TABLET TWICE DAILY What changed: when to take this   rosuvastatin 20 MG tablet Commonly known as: CRESTOR TAKE 1 TABLET EVERY DAY What changed: when to take this   Stiolto  Respimat 2.5-2.5 MCG/ACT Aers Generic drug: Tiotropium Bromide-Olodaterol Inhale 2 puffs into the lungs in the morning.   tamsulosin 0.4 MG Caps capsule Commonly known as: FLOMAX Take 0.8 mg by mouth at bedtime.   valsartan 320 MG tablet Commonly known as: DIOVAN TAKE 1 TABLET EVERY DAY What changed: when to take this   warfarin 5 MG tablet Commonly known as: COUMADIN Take as directed. If you are unsure how to take this medication, talk to your nurse or doctor. Original instructions: Take 1.5 tablets (7.5 mg total) by mouth See admin instructions. Take 1.5 tablets (7.5 mg) by mouth in the evenings on Tuesdays, Thursdays & Sundays. Take 2 tablets (10 mg) by mouth in the evening on Mondays, Wednesdays, Fridays & Saturdays. What changed: See the new instructions.   zonisamide 50 MG capsule Commonly known as: ZONEGRAN Take 100 mg by mouth at bedtime.        Outstanding Labs/Studies   INR check 7/13  Duration of Discharge Encounter   Greater than 30 minutes including physician time.  Signed, Reino Bellis, NP 11/12/2020, 11:18 AM   I have personally seen and examined this patient. I agree with the assessment and plan as outlined above.  Pt with cath and stable CAD. No PCI needed. CP resolved. D/C home.   Lauree Chandler 11/13/2020  7:33 AM

## 2020-11-12 NOTE — Progress Notes (Signed)
ANTICOAGULATION CONSULT NOTE  Pharmacy Consult for heparin Indication: chest pain/ACS  Allergies  Allergen Reactions   Doxycycline Rash    Patient Measurements: Height: 5\' 9"  (175.3 cm) Weight: 91.6 kg (201 lb 14.4 oz) IBW/kg (Calculated) : 70.7 Heparin Dosing Weight: 86kg  Vital Signs: Temp: 97.6 F (36.4 C) (07/07 0806) Temp Source: Oral (07/07 0806) BP: 130/52 (07/07 0806) Pulse Rate: 78 (07/07 0813)  Labs: Recent Labs    11/10/20 1513 11/10/20 1851 11/11/20 0319 11/11/20 0718 11/12/20 0452  HGB 12.2*  --  11.3*  --  11.3*  HCT 33.7*  --  30.2*  --  31.7*  PLT 135*  --  120*  --  120*  LABPROT  --   --  20.4* 19.0* 17.5*  INR  --   --  1.7* 1.6* 1.4*  HEPARINUNFRC  --   --   --  0.51  --   CREATININE 1.09  --  0.86  --  0.87  TROPONINIHS 3 4  --   --   --      Estimated Creatinine Clearance: 78.3 mL/min (by C-G formula based on SCr of 0.87 mg/dL).   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  Patent LIMA-LAD.  SVG-D2 occluded.  RPDA occluded. Seq SVG-r PDA-PL patent to PDA, but the PDA is occluded as is the proximal native PDA   COPD (chronic obstructive pulmonary disease) (HCC)    Diverticulosis    Dyslipidemia, goal LDL below 70    Monitored by PCP. On statin (last labs scan from September 2015: TC 149, TG 127, HDL 55, LDL 69)   ED (erectile dysfunction)    GERD (gastroesophageal reflux disease)    History of SBO (small bowel obstruction) March 2015   Abdominal surgery with LOA   Hx of irritable bowel syndrome    Hypertension, essential    Lesion of right native kidney  08/20/2013   Abdominal US : 2.4 cm x 1.7 complex lesion in the upper pole the right kidney in March of 2012 concerning for RCC although enlarging  hemorrhagic cyst could have this appearance as well.,    Monoclonal gammopathy of undetermined significance    Paroxysmal-persistent atrial fibrillation ([place]); CHA2DS2-VASc Score 7 03/23/2017   CHA2DS2-VASc (CHF, HTN, CAD, agex2, TIAx2 = 7)-> Most recent episode January 3-5, 2022; unsuccessful cardioversion with amiodarone; -> went to ER-synchronized DCCV x1   Psoriasis    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 Cyndia BentDeneen Harts procedure - [employer] aVR (25 mm prosthesis) with aortic root conduit;; Echo 10/'14: EF 55-60%, Mod Conc LVH, Gr 1 DD, Well Seated AoV Mech Prosthesis with normal P gradients (no stenosis), Mod-Severe LA dilation --> follow-up echo November 2016: Well functioning mechanical valve. Paradoxical septal motion. EF 50-55%. Severe LA dilation. Moderate RA dilation.    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 Patient s/p cath, noted single vessel cad, no targets for pci.   PTA regimen 10 mg every day, except 7.5 mg SunTuTh  Patient  received warfarin 10 mg on 7/6 (INR 1.4 - this AM), and lovenox 1 mg/kg q 12 hours starting 7/7. Hgb 11.3, hct 31.7, plt 120 - stable.  Plan to discharge on lovenox/warfarin bridge today.  Goal of Therapy:  Anti-Xa level 0.6-1.2 units/ml Monitor platelets by anticoagulation protocol: Yes   Plan:  Warfarin 12.5 mg tonight Discharge recs: resume home regimen starting tomorrow  Lovenox 100 mg q 12 hours (as ordered PTA)  F/u INR check   Vance Peper, PharmD PGY1 Resident Phone: [phone] 11/12/2020 9:00 AM

## 2020-11-12 NOTE — Progress Notes (Signed)
Removed TR band from left radial. Site is a level 0. Applied gauze and Tegaderm to site. Educated pt to leave in place 24 hrs. Pt verbalized understanding.

## 2020-11-12 NOTE — TOC Benefit Eligibility Note (Signed)
Patient Teacher, English as a foreign language completed.    The patient is currently admitted and upon discharge could be taking Mitigare 0.6 mg Capsules.  The current 30 day co-pay is, $50.74.   The patient is insured through [place] [place] Patient Advocate Specialist [place] Team Direct Number: [phone]  Fax: [phone]

## 2020-11-13 NOTE — Telephone Encounter (Signed)
RN sent message to patient  Hello Mr Hitz No you do not need echo on July 26 . I will cancel test Yes you need to keep appt Dec 07, 2020 No,you do not need continue taking Aspirin 81 mg. I confirmed with Dr Ellyn Hack.  I hope you a good  weekend! Ivin Booty RN

## 2020-11-17 LAB — POCT INR: INR: 3.7 — AB (ref 2.0–3.0)

## 2020-11-18 LAB — PROTIME-INR
INR: 3.7 — ABNORMAL HIGH (ref 0.9–1.2)
Prothrombin Time: 36.6 s — ABNORMAL HIGH (ref 9.1–12.0)

## 2020-11-19 ENCOUNTER — Ambulatory Visit (INDEPENDENT_AMBULATORY_CARE_PROVIDER_SITE_OTHER): Payer: Medicare HMO | Admitting: Cardiovascular Disease

## 2020-11-19 DIAGNOSIS — Z7901 Long term (current) use of anticoagulants: Secondary | ICD-10-CM

## 2020-11-19 DIAGNOSIS — Z952 Presence of prosthetic heart valve: Secondary | ICD-10-CM | POA: Diagnosis not present

## 2020-11-21 DIAGNOSIS — R000 Tachycardia, unspecified: Secondary | ICD-10-CM

## 2020-11-21 DIAGNOSIS — I480 Paroxysmal atrial fibrillation: Secondary | ICD-10-CM

## 2020-11-23 NOTE — Telephone Encounter (Signed)
MY CHART CONVERSATION:  Tag, Wurtz"  You 2 days ago   LE   Dr Allison Quarry Response:  With all that has been going on I forget to mention that my heart has been racing for about 3 weeks. Every time I do about anything my heart rate goes way up. Standing up can make it go to 108 , [facility] was 115. I always stop at the fitness center to check my blood pressure and rest on [facility]. Friday my stat was 124/62-115 then 2 minutes later it was 124/60-95 and a few more minutes later it was 114/62-86 I mentioned it to a cardiologist in the hospital but forgot about it afterward Please advise Erik Meyer   Sorry to hear that Erik Meyer -- can you tell if your hear rate is regular or irregular?  You may be having episodes of Atrial Fibrillation (which could be causing your symptoms).  We should have you wear a 14 d Zio Pach monitor to evaluate for either recurrent Atrial Fibrillation or any other type of abnormal rhythm.   I will as Erik Meyer to place an order for a Monitor (that will be sent out to you) (Dx PAF, rapid heart rates).  We are both out of the office on 7/19 - so she will not see this until 7/20 & will likely contact you to discuss this note.  Glenetta Hew, MD

## 2020-11-24 ENCOUNTER — Ambulatory Visit (INDEPENDENT_AMBULATORY_CARE_PROVIDER_SITE_OTHER): Payer: Medicare HMO

## 2020-11-24 DIAGNOSIS — I480 Paroxysmal atrial fibrillation: Secondary | ICD-10-CM

## 2020-11-24 DIAGNOSIS — R000 Tachycardia, unspecified: Secondary | ICD-10-CM

## 2020-11-24 NOTE — Progress Notes (Unsigned)
Patient enrolled for [employer] to mail a 14 day ZIO XT monitor to address on file. 

## 2020-11-27 DIAGNOSIS — R000 Tachycardia, unspecified: Secondary | ICD-10-CM | POA: Diagnosis not present

## 2020-11-27 DIAGNOSIS — I480 Paroxysmal atrial fibrillation: Secondary | ICD-10-CM

## 2020-12-01 ENCOUNTER — Other Ambulatory Visit (HOSPITAL_COMMUNITY): Payer: Medicare HMO

## 2020-12-02 ENCOUNTER — Other Ambulatory Visit: Payer: Self-pay

## 2020-12-02 DIAGNOSIS — I480 Paroxysmal atrial fibrillation: Secondary | ICD-10-CM

## 2020-12-02 LAB — POCT INR: INR: 5.6 — AB (ref 2.0–3.0)

## 2020-12-03 ENCOUNTER — Ambulatory Visit (INDEPENDENT_AMBULATORY_CARE_PROVIDER_SITE_OTHER): Payer: Medicare HMO | Admitting: Cardiology

## 2020-12-03 DIAGNOSIS — Z952 Presence of prosthetic heart valve: Secondary | ICD-10-CM | POA: Diagnosis not present

## 2020-12-03 DIAGNOSIS — Z7901 Long term (current) use of anticoagulants: Secondary | ICD-10-CM

## 2020-12-06 ENCOUNTER — Encounter: Payer: Self-pay | Admitting: Cardiology

## 2020-12-06 NOTE — Progress Notes (Signed)
Primary Care Provider: Ivan Anchors, MD Cardiologist: Glenetta Hew, MD Electrophysiologist: None  Clinic Note: No chief complaint on file.  ===================================  ASSESSMENT/PLAN   Problem List Items Addressed This Visit       Cardiology Problems   Paroxysmal-persistent atrial fibrillation [facility]); CHA2DS2-VASc Score 7 (CHF, HTN, VascDz/CAD, >75 -2, TIA - 2) (Chronic)    At this point, symptomatically he seems to be maintaining sinus rhythm.    Currently wearing a monitor first and reassess.  We also discussed a home monitor that he can use to identify A. fib if he has breakthrough episodes.  Plan: Continue rate control with 25 mg Toprol. 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  Goal INR 2.5-3.5. PRN amiodarone for breakthrough A. fib.   He usually has symptoms, but we discussed purchasing a home monitor (smart watch, Kardia mobile, etc.) to help ID possible Afib      Chronic diastolic heart failure ([place]) (Chronic)    Euvolemic on exam with no PND or orthopnea.  Trivial edema..  Relatively normal LVEDP on cath.  Plan:  Continue current dose of beta-blocker and ARB along with amlodipine.  Continue furosemide which he takes 1 to 2 tablets daily depending on weight.       Coronary artery disease involving native coronary artery of native heart with angina pectoris ([place]) - Primary (Chronic)    He has native and graft disease.  But stable now with 2 Myoview suggesting abnormalities confirmed with cardiac catheterization and no change.  In the future, I would asked that if stress test within the future, but this will be taken into consideration.  Images looked almost identical, unless there is a new area of concern, I would not return to the Cath Lab.  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  Plan:  Continue current dose of  beta-blocker and amlodipine for antianginal benefit along with Ranexa. ==> I suggested that he double up his Ranexa for couple days if he has recurrent chest pain symptoms. Continue current dose of rosuvastatin. He is currently on aspirin and warfarin, with stable cardiac findings, I think it would be reasonable to stop aspirin if he has any significant issues with bleeding or bruising.        Dyslipidemia, goal LDL below 70 (Chronic)    Lipids recently checked are well controlled on current dose of rosuvastatin 20 mg daily.  Should be due for recheck in roughly 6 months when he returns for follow-up.        Hypertension, essential (Chronic)    Blood pressures well controlled today.  He says at home it is 125/65.  He is currently on stable dose of Toprol 25 mg daily (max tolerated) along with Norvasc 10 mg for blood pressure/angina, and max dose valsartan along with PRN furosemide which he takes most days.  No change.         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  Would be due for echo in 2024.  Discussed SBE prophylaxis.       S/P CABG x 4 (Chronic)    I probably will not check any more surveillance Myoview tests because they seem to be abnormal and lead to unnecessary cardiac catheterization.  He has a LIMA patent with occluded LAD, he has an occluded PDA but relatively normal RCA.  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  Not a PCI target.       Chest pain of pericarditis    Hard to tell what his symptoms truly are related to.  In the hospital when I talked to him, it sounds like  his symptoms may be consistent with pericarditis -- at that time he noted worsening symptoms lying down flat and better sitting up.  (He says the opposite today) No evidence of pericardial effusion on echo, and the rub on exam.  So not a clear diagnosis.  Was empirically treated with colchicine and has had some improvement of symptoms.  Unfortunately, at this point he is having GI distress likely related to colchicine.  Plan: Discontinue colchicine when current bottle complete.. Chest pain was evaluated with a cardiac catheterization because Myoview which did not had correlating Findings with Myoview twice       Fatigue due to treatment    No longer on amiodarone and reduced dose of beta-blocker which has improved some fatigue.  Still notes some fatigue, but has improved.       Chronotropic incompetence    He seems to doing relatively well on reduced dose of beta-blocker.  Able to get his heart rate up into the 115 bpm range despite having resting bradycardia.  He is currently wearing a monitor so we can fully evaluate his heart rate responsiveness.       Long term current use of anticoagulant therapy (Chronic)    Back on warfarin.  I have removed enoxaparin from his list -> was used for bridging.       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  Will order lower extremity venous Dopplers just to exclude significant reflux.       ===================================  HPI:    Erik Meyer is a 80 y.o. male with a PMH notable for MV-CAD/TIA/AI-->CABG x4/Bentall-AVR, PAF, Cardiomyopathy, chronic fatigue who presents today for post hospital follow-up.  MV-CAD-> s/p CABG/AVR/aortoplasty in 0240 complicated by PAF (on warfarin for mechanical valve), cardiomyopathy and chronic fatigue  Cardiac Cath 04/11/2017: Native RCA ~patent to PAV/PL, Native LM-LCx(OM1 & OM2), LAD-D1 &  D2 then 100% after SP2). Patent LIMA-dLAD, occluded SeqSVG-RPDA-PL & SVG-D2.  September 2020: GXT with no evidence of chronotropic incompetence. Weaned off of amiodarone, maintained on Toprol 25-> less fatigue with reduced dose Plan was to allow mild permissive hypertension.  He is amiodarone as needed breakthrough A. fib Converted to rosuvastatin Echo showed EF stable at 45 to 50%. CATH 11/11/2020 for ? Unstable Angina - Stable finding on Cath & Echo.  LISA BLAKEMAN was last seen on October 19, 2020 for ER follow-up on June 10.  He started having symptoms of sharp upper chest pain/pressure.  Was 6/10 at worst.  Persisted throughout the day on June 9 the 10th.  No improvement with additional dose of Ranexa.  Pain not made worse with exertion.  Able to do stationary bike.  Noted baseline exertional dyspnea related to COPD.  No breakthrough A. fib. Echo & Myoview ordered  Recent Hospitalizations:  7 /5-11/2020:  Admitted with suspicion of unstable angina.  (Had cath pending).  Worsening chest pain with minimal exertion.  Myoview again confusing and therefore was referred for cardiac cath based on previous note.  Ruled out for MI.   Maintaining sinus rhythm. Cath & Echo Stable  ? Empiric Dx of Pericarditis -.> d/c on Colchicine Event Monitor ordered to determine recurrence of Afib.    Reviewed  CV studies:    The following studies were reviewed today: (if available, images/films reviewed: From Epic Chart or Care Everywhere) Myoview 10/28/2020: Equivocal Findings;   EF ~45-50%. No EKG changes. Medium Size - Severe defect - basal inferolateral & mid inferolateral. Also Medium Size-Severe defect in mid Anterior, apical Anterior & Apex. C/w Prior MI with Peri-Infarct Ischemia. - INTERMEDIATE RISK (Very Similar to Myoview from 2018) L Heart Cath 11/11/2020: pLAD 65%, mLAd 65% (stable) -> 100% CTO. Patent LIMA-dLAD. Ost rPDA 100% CTO with patent RPL system.. SVG-D2 100%. SVG-rPDA-PL occluded. Native LCx -  ~normal.  STABLE FINDINGS   TTE 11/11/2020: EF 45 to 50%.  Mildly decreased function.  Inferolateral hypokinesis and small area of anteroseptal wall motion abnormality.  RV is borderline size with moderate dysfunction-normal CVP.Marland Kitchen  Both atria are moderately dilated.  25 mm bileaflet mechanical aortic valve in place.  Normal function and normal gradients..  Pending Event Monitor -> currently still wearing.  Interval History:   Erik Meyer presents here today for hospital follow-up after cardiac catheterization revealed stable findings.  He was treated empirically with colchicine for possible pericarditis given nature of his symptoms.  He says that over the last week or 2, he the main symptom he has been noticing is just upset stomach with rumbling in rumbling in his stomach.  He his bowels have been somewhat unusual and he has been having cramping.  He says the chest discomfort is actually better.  Still has it off and on, not as frequent and not as long-lasting.  Does not get more than maybe 2/10.  He tells me today (unlike previously) that his pain is worse with sitting up and better with laying down.  That is different from what he said in the hospital.  He does not necessarily notice any exertional chest pain or pressure.  He is back doing exercise and he notes that when he exercises routinely his heart rate gets into the 110-115 BPM range.  He also says that shortly after he exercises his blood pressure is usually lower.  He has not had any symptoms to suggest breakthrough episodes of A. fib.  He is back to doing his exercise both walking on a treadmill and doing stationary bicycle.  Has not yet been back to doing water aerobics.  CV Review of Symptoms (Summary) Cardiovascular ROS: positive for - chest pain, dyspnea on exertion, and overall improved chest discomfort.  No more than 2/10, worse with sitting up now.  Less frequent, shorter durations.  Back to baseline exertional dyspnea.   Notes more abdominal pain, cramping and upset/atypical stools. negative for - edema, irregular heartbeat, orthopnea, palpitations, paroxysmal nocturnal dyspnea, rapid heart rate, shortness of breath, or lightheadedness, dizziness, wooziness; syncope/near syncope or TIA/amaurosis fugax.  Claudication  REVIEWED OF SYSTEMS   Review of Systems  Constitutional:  Malaise/fatigue: A little bit of exercise intolerance.. Weight loss: May be a little.  Cardiovascular:        Per HPI  Gastrointestinal:  Positive for abdominal pain (Cramping, bloating, rumbling). Negative for blood in stool, constipation and melena.  Has been having intermittent loose stools, but not truly diarrhea.  Genitourinary:  Negative for hematuria.  Musculoskeletal:  Positive for joint pain. Negative for falls.  Neurological:  Positive for dizziness (If he stands up too fast). Negative for focal weakness and weakness.  Psychiatric/Behavioral:  Negative for memory loss. The patient is not nervous/anxious and does not have insomnia.        In better spirits   I have reviewed and (if needed) personally updated the patient's problem list, medications, allergies, past medical and surgical history, social and family history.   PAST MEDICAL HISTORY   Past Medical History:  Diagnosis Date   Anemia    CAD, multiple vessel 12/2002   Found during preop cath for [facility] AVR procedure; Myoview 5/'13: No ischemia or infarct, EF 53% --> cath December 2018: Proximal LAD 65% followed by mid LAD 65% then 100% occlusion.  Patent LIMA-LAD.  SVG-D2 occluded.  RPDA occluded. Seq SVG-r PDA-PL patent to PDA, but the PDA is occluded as is the proximal native PDA; Similar Findings 11/2020   COPD (chronic obstructive pulmonary disease) (HCC)    Diverticulosis    Dyslipidemia, goal LDL below 70    Monitored by PCP. On statin (last labs scan from September 2015: TC 149, TG 127, HDL 55, LDL 69)   ED (erectile dysfunction)    GERD (gastroesophageal  reflux disease)    History of SBO (small bowel obstruction) 07/2013   Abdominal surgery with LOA   Hx of irritable bowel syndrome    Hypertension, essential    Lesion of right native kidney 08/20/2013   Abdominal US : 2.4 cm x 1.7 complex lesion in the upper pole the right kidney in March of 2012 concerning for RCC although enlarging hemorrhagic cyst could have this appearance as well.,    Monoclonal gammopathy of undetermined significance    Paroxysmal-persistent atrial fibrillation ([place]); CHA2DS2-VASc Score 7 03/23/2017   CHA2DS2-VASc (CHF, HTN, CAD, agex2, TIAx2 = 7)-> Most recent episode January 3-5, 2022; unsuccessful cardioversion with amiodarone; -> went to ER-synchronized DCCV x1   Psoriasis    S/P AVR (aortic valve replacement) and aortoplasty 12/2002   Dr. Cyndia BentDeneen Harts procedure - St. Jude aVR (25 mm prosthesis) with aortic root conduit;; Echo 10/'14: EF 55-60%, Mod Conc LVH, Gr 1 DD, Well Seated AoV Mech Prosthesis with normal P gradients (no stenosis), Mod-Severe LA dilation --> follow-up echo November 2016: Well functioning mechanical valve. Paradoxical septal motion. EF 50-55%. Severe LA dilation. Moderate RA dilation.    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   LIMA-LAD, SVG-D1, SVG- PDA-PLA (along with AVR); SVG-Diag & Seq SVG-rPDA-PL both CTO (2018)    PAST SURGICAL HISTORY   Past Surgical History:  Procedure Laterality Date   AORTIC VALVE REPLACEMENT  12/26/2002   CG mechanical mechanical AVR as part of Bentall procedure   BENTALL PROCEDURE  12/26/2002   Aortic valve replacement and replacement of aortic root aneurysm using a 25 mm St. JUDE mechanical valve conduit with reimplantation of the coronary arteries   CARDIAC CATHETERIZATION  12/20/2002   diffuse  coronary disease,prior to his BENTALL PROCEDURE   CORONARY ARTERY BYPASS GRAFT  12/26/2002   LIMA to LAD,SVG to diagonal, SVG to PDA and PLA.    CORONARY/GRAFT ANGIOGRAPHY N/A 04/21/2017   Procedure: CORONARY/GRAFT ANGIOGRAPHY;   Surgeon: Leonie Man, MD;  Location: [place] [facility];  Service: Cardiovascular; Native RCA ~patent to PAV/PL, Native LM-LCx(OM1 & OM2), LAD-D1 & D2 then 100% after SP2). Patent LIMA-dLAD, occluded  SeqSVG-RPDA-PL & SVG-D2. --. FINDINGS CORELATED WELL WITH PERFUSION DEFECT SEEN ON MYOVIEW - INFERIOR INFARCT & ANTERIOR INFARCT. --Med Rx   EXERCISE TOLERANCE TEST  09/12/2017    5 METS Baseline ST depression.  No new changes with exercise.  Heart rate went to 141 bpm.  Poor exercise tolerance reaching target heart rate at 2:26-minute,.  No chronotropic Incompetence   LAPAROSCOPIC LYSIS OF ADHESIONS  07/2013   performed in [place] for SBO    LEFT HEART CATH AND CORONARY ANGIOGRAPHY N/A 11/11/2020   Procedure: LEFT HEART CATH AND CORONARY ANGIOGRAPHY;  Surgeon: Leonie Man, MD;  Location: [place] [facility];  Service: Cardiovascular;   pLAD 65%, mLAd 65% (stable) -> 100% CTO. Patent LIMA-dLAD. Ost rPDA 100% CTO with patent RPL system.. SVG-D2 100%. SVG-rPDA-PL occluded. Native LCx - ~normal.  STABLE FINDINGS (Still correlate with Myoview)   NM MYOVIEW LTD  10/04/2011   09/2011: showed no ischemia;EF52%; 03/2017 - [facility]) [place].  Mild LV dilation.  EF 45 to 50%..  Concern for prior infarct in the inferior lateral wall and apical anterior wall consistent with infarct with peri-infarct ischemia. => On review, these findings correlate with cardiac catheter showing inferior infarct and anterior infarct.   NM MYOVIEW LTD  10/28/2020   Equivocal Findings;   EF ~45-50%. No EKG changes. Medium Size - Severe defect - basal inferolateral & mid inferolateral. Also Medium Size-Severe defect in mid Anterior, apical Anterior & Apex. C/w Prior MI with Peri-Infarct Ischemia. - INTERMEDIATE RISK - STABLE FROM 2018   PARTIAL COLECTOMY  1994   THORACIC AORTIC ANEURYSM REPAIR  2004   Saint Jude aortic valve conduit-Bentall AVR   TRANSTHORACIC ECHOCARDIOGRAM  02/2017   [facility])  technically difficult study.  Mild LVH.  Mildly reduced EF of 45-50%.  1+ MR and TR.  Severe LA dilation.  Moderate RV dilation.  Unable to assess diastolic function.  RVP 42 mmHg suggesting mild-moderate pulmonary hypertension.  Mechanical Saint Jude Aortic Valve Prosthesis not well visualized, but gradient 5 mmHg.   TRANSTHORACIC ECHOCARDIOGRAM  11/2018    Stable EF 45-50%-moderate HK of apical-anteroseptal wall..  Moderate asymmetric LVH with GR 1 DD.  Normal RV.  RA mildly dilated.  Moderate MAC.   TRANSTHORACIC ECHOCARDIOGRAM  11/11/2020   EF 45 to 50%.  Mildly decreased function.  Inferolateral hypokinesis and small area of anteroseptal wall motion abnormality.  RV is borderline size with moderate dysfunction-normal CVP.Marland Kitchen  Both atria are moderately dilated.  25 mm bileaflet mechanical aortic valve in place.  Normal function and normal gradients..   VENTRAL HERNIA REPAIR     repair with mesh by Dr Collene Mares at [facility]     There is no immunization history on file for this patient.  MEDICATIONS/ALLERGIES   Current Meds  Medication Sig   amiodarone (PACERONE) 200 MG tablet Take 2 tablets (400 mg total) by mouth 2 (two) times daily as needed (atrial fibrillation).   amLODipine (NORVASC) 10 MG tablet Take 1 tablet (10 mg total) by mouth daily.   ampicillin (PRINCIPEN) 500 MG capsule Take 500 mg by mouth every evening.   aspirin EC 81 MG EC tablet Take 1 tablet (81 mg total) by mouth daily. Swallow whole.   betamethasone dipropionate (DIPROLENE) 0.05 % ointment Apply 1 application topically 2 (two) times daily as needed (psoriasis).   Colchicine (MITIGARE) 0.6 MG CAPS Take 0.6 mg by mouth daily.   Cyanocobalamin (RA VITAMIN B12) 2000 MCG TBCR Take 2,000 mcg by mouth  in the morning.   DENTA 5000 PLUS 1.1 % CREA dental cream Place 1 application onto teeth at bedtime.   esomeprazole (NEXIUM) 20 MG capsule Take 20 mg by mouth in the morning.   ferrous sulfate 325 (65 FE) MG tablet Take 325 mg by mouth  every evening.   furosemide (LASIX) 20 MG tablet TAKE 1 TO 2 TABLETS EVERY DAY IF NEEDED   LORazepam (ATIVAN) 0.5 MG tablet Take 0.5 mg by mouth in the morning, at noon, and at bedtime.   metoprolol succinate (TOPROL-XL) 25 MG 24 hr tablet TAKE 1 TABLET EVERY DAY   metroNIDAZOLE (METROGEL) 1 % gel Apply 1 application topically daily as needed (roasacea).   nitroGLYCERIN (NITROSTAT) 0.4 MG SL tablet Place 1 tablet (0.4 mg total) under the tongue every 5 (five) minutes as needed for chest pain.   polyvinyl alcohol (LIQUIFILM TEARS) 1.4 % ophthalmic solution Place 1-2 drops into both eyes 3 (three) times daily as needed for dry eyes.   ranolazine (RANEXA) 500 MG 12 hr tablet TAKE 1 TABLET TWICE DAILY   rosuvastatin (CRESTOR) 20 MG tablet TAKE 1 TABLET EVERY DAY   tamsulosin (FLOMAX) 0.4 MG CAPS capsule Take 0.8 mg by mouth at bedtime.   Tiotropium Bromide-Olodaterol (STIOLTO RESPIMAT) 2.5-2.5 MCG/ACT AERS Inhale 2 puffs into the lungs in the morning.   valsartan (DIOVAN) 320 MG tablet TAKE 1 TABLET EVERY DAY   warfarin (COUMADIN) 5 MG tablet Take 1.5 tablets (7.5 mg total) by mouth See admin instructions. Take 1.5 tablets (7.5 mg) by mouth in the evenings on Tuesdays, Thursdays & Sundays. Take 2 tablets (10 mg) by mouth in the evening on Mondays, Wednesdays, Fridays & Saturdays.   zonisamide (ZONEGRAN) 50 MG capsule Take 100 mg by mouth at bedtime.   [DISCONTINUED] potassium chloride SA (KLOR-CON) 20 MEQ tablet TAKE 1 TABLET TWICE DAILY     Allergies  Allergen Reactions   Doxycycline Rash    SOCIAL HISTORY/FAMILY HISTORY   Reviewed in Epic:  Pertinent findings:  Social History   Tobacco Use   Smoking status: Former    Types: Cigarettes    Quit date: 06/16/1997    Years since quitting: 23.5   Smokeless tobacco: Never  Vaping Use   Vaping Use: Never used  Substance Use Topics   Alcohol use: Yes   Drug use: No   Social History   Social History Narrative   Married gentleman with no  children. Quit smoking in 1998.   Education: 2 years of college. He does drink caffeine.  He drinks social beer.   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 Claiborne Billings in [place]; GI: Dr. Janus Molder in [place]      **November 2020: He notes increased stress family to care for his wife who is showing signs of worsening dementia.  Hard to adjust to this and is somewhat depressing.    -He is try to work on cutting back on his intake of salt specific    OBJCTIVE -PE, EKG, labs   Wt Readings from Last 3 Encounters:  12/07/20 202 lb 6.4 oz (91.8 kg)  11/12/20 201 lb 14.4 oz (91.6 kg)  10/28/20 207 lb (93.9 kg)    Physical Exam: BP 120/60 (BP Location: Left Arm)   Pulse (!) 56   Ht _0  (1.753 m)  Wt 202 lb 6.4 oz (91.8 kg)   SpO2 98%   BMI 29.89 kg/m  Physical Exam Vitals reviewed.  Constitutional:      General: He is not in acute distress.    Appearance: Normal appearance. He is obese. He is not ill-appearing (Healthy-appearing) or diaphoretic.     Comments: Well-groomed.  Well-nourished  HENT:     Head: Normocephalic and atraumatic.  Neck:     Vascular: No carotid bruit, hepatojugular reflux or JVD.  Cardiovascular:     Rate and Rhythm: Normal rate and regular rhythm. No extrasystoles are present.    Chest Wall: PMI is not displaced.     Pulses: Normal pulses.     Heart sounds: S1 normal and S2 normal. Heart sounds are distant. Murmur (Harsh 2/6C-D SEM at RUSB.-Neck) heard.    No friction rub. No gallop.  Pulmonary:     Effort: Pulmonary effort is normal. No respiratory distress.     Breath sounds: Normal breath sounds. No wheezing, rhonchi or rales.  Chest:     Chest wall: No tenderness.  Musculoskeletal:        General: Swelling (Trivial ankle) present. Normal range of motion.     Cervical back: Normal range of  motion and neck supple.  Skin:    General: Skin is warm and dry.     Coloration: Skin is not pale.  Neurological:     General: No focal deficit present.     Mental Status: He is alert and oriented to person, place, and time. Mental status is at baseline.  Psychiatric:        Behavior: Behavior normal.        Thought Content: Thought content normal.        Judgment: Judgment normal.     Comments: Notably in better spirits.     Adult ECG Report Not checked  Recent Labs:   Lab Results  Component Value Date   CHOL 132 11/11/2020   HDL 65 11/11/2020   LDLCALC 54 11/11/2020   TRIG 65 11/11/2020   CHOLHDL 2.0 11/11/2020   Lab Results  Component Value Date   CREATININE 0.87 11/12/2020   BUN 7 (L) 11/12/2020   NA 128 (L) 11/12/2020   K 3.7 11/12/2020   CL 100 11/12/2020   CO2 22 11/12/2020   No results found for: HGBA1C CBC Latest Ref Rng & Units 11/12/2020 11/11/2020 11/10/2020  WBC 4.0 - 10.5 K/uL 1.6(L) 1.8(L) 1.7(L)  Hemoglobin 13.0 - 17.0 g/dL 11.3(L) 11.3(L) 12.2(L)  Hematocrit 39.0 - 52.0 % 31.7(L) 30.2(L) 33.7(L)  Platelets 150 - 400 K/uL 120(L) 120(L) 135(L)     [facility] Related to CBC And Differential Component 10/16/20  07/09/20   WBC 2.3 Low  2.4 Low   RBC 3.66 Low  3.69 Low   HGB 12.6 Low  12.6 Low   HCT 34.5 Low  37.0 Low   MCV 94 100 High   MCH 34.4 High  34.1 High   MCHC 36.5 High  34.1  Plt Ct 119 Low  134 Low    Comprehensive Metabolic Panel Component 71/24/58  07/09/20   Na 125 Low  --  Potassium 3.9 4.1  Cl 90 Low  --  CO2 24 23  AGAP 11 --  Glucose 147 High  95  BUN 13 12  Creatinine 0.93 0.93  Ca 9.2 --  ==================================================  COVID-19 Education: The signs and symptoms of COVID-19 were discussed with the patient and how to seek care for  testing (follow up with PCP or arrange E-visit).    I spent a total of 24 min with the patient spent in direct patient consultation.  Additional time spent with chart  review  / charting (studies, outside notes, etc): 18  min Total Time: 42 min  Current medicines are reviewed at length with the patient today.  (+/- concerns) n/a  This visit occurred during the SARS-CoV-2 public health emergency.  Safety protocols were in place, including screening questions prior to the visit, additional usage of staff PPE, and extensive cleaning of exam room while observing appropriate contact time as indicated for disinfecting solutions.  Notice: This dictation was prepared with Dragon dictation along with smaller phrase technology. Any transcriptional errors that result from this process are unintentional and may not be corrected upon review.  Patient Instructions / Medication Changes & Studies & Tests Ordered   Patient Instructions  Medication Instructions:   Stop Colchicine when finished   If you have chest pain double Ranexa dose for a couple of days then return to normal dose    Continue all other medications  *If you need a refill on your cardiac medications before your next appointment, please call your pharmacy*   Lab Work:  None ordered   Testing/Procedures:  Schedule lower ext venous dopplers   Follow-Up: At [facility], you and your health needs are our priority.  As part of our continuing mission to provide you with exceptional heart care, we have created designated Provider Care Teams.  These Care Teams include your primary Cardiologist (physician) and Advanced Practice Providers (APPs -  Physician Assistants and Nurse Practitioners) who all work together to provide you with the care you need, when you need it.  We recommend signing up for the patient portal called "MyChart".  Sign up information is provided on this After Visit Summary.  MyChart is used to connect with patients for Virtual Visits (Telemedicine).  Patients are able to view lab/test results, encounter notes, upcoming appointments, etc.  Non-urgent messages can be sent to your  provider as well.   To learn more about what you can do with MyChart, go to [email or web address].    Your next appointment:  6 months   The format for your next appointment: Office    Provider:  Dr.Desiray Orchard    Studies Ordered:   No orders of the defined types were placed in this encounter.    Glenetta Hew, M.D., M.S. Interventional Cardiologist   Pager # [phone] Phone # [phone] [address] [place], [place] [address]   Thank you for choosing Heartcare at [facility]!!

## 2020-12-07 ENCOUNTER — Ambulatory Visit: Payer: Medicare HMO | Admitting: Cardiology

## 2020-12-07 ENCOUNTER — Encounter: Payer: Self-pay | Admitting: Cardiology

## 2020-12-07 ENCOUNTER — Other Ambulatory Visit: Payer: Self-pay

## 2020-12-07 ENCOUNTER — Telehealth: Payer: Self-pay

## 2020-12-07 ENCOUNTER — Ambulatory Visit (INDEPENDENT_AMBULATORY_CARE_PROVIDER_SITE_OTHER): Payer: Medicare HMO

## 2020-12-07 VITALS — BP 120/60 | HR 56 | Ht 69.0 in | Wt 202.4 lb

## 2020-12-07 DIAGNOSIS — I319 Disease of pericardium, unspecified: Secondary | ICD-10-CM | POA: Diagnosis not present

## 2020-12-07 DIAGNOSIS — I25119 Atherosclerotic heart disease of native coronary artery with unspecified angina pectoris: Secondary | ICD-10-CM

## 2020-12-07 DIAGNOSIS — Z952 Presence of prosthetic heart valve: Secondary | ICD-10-CM

## 2020-12-07 DIAGNOSIS — I10 Essential (primary) hypertension: Secondary | ICD-10-CM | POA: Diagnosis not present

## 2020-12-07 DIAGNOSIS — I480 Paroxysmal atrial fibrillation: Secondary | ICD-10-CM | POA: Diagnosis not present

## 2020-12-07 DIAGNOSIS — I872 Venous insufficiency (chronic) (peripheral): Secondary | ICD-10-CM

## 2020-12-07 DIAGNOSIS — Z7901 Long term (current) use of anticoagulants: Secondary | ICD-10-CM

## 2020-12-07 DIAGNOSIS — Z951 Presence of aortocoronary bypass graft: Secondary | ICD-10-CM

## 2020-12-07 DIAGNOSIS — I4589 Other specified conduction disorders: Secondary | ICD-10-CM

## 2020-12-07 DIAGNOSIS — E785 Hyperlipidemia, unspecified: Secondary | ICD-10-CM

## 2020-12-07 DIAGNOSIS — I5032 Chronic diastolic (congestive) heart failure: Secondary | ICD-10-CM

## 2020-12-07 DIAGNOSIS — R5383 Other fatigue: Secondary | ICD-10-CM

## 2020-12-07 LAB — POCT INR: INR: 2.3 (ref 2.0–3.0)

## 2020-12-07 NOTE — Patient Instructions (Signed)
Take 2.5 tablets tonight only and then resume taking 10 mg daily except 7.5 mg each Sunday, Tuesday and Thursday.  Repeat INR 4 weeks

## 2020-12-07 NOTE — Telephone Encounter (Signed)
ORDERS FAXED TO [facility]

## 2020-12-07 NOTE — Patient Instructions (Signed)
Medication Instructions:   Stop Colchicine when finished   If you have chest pain double Ranexa dose for a couple of days then return to normal dose    Continue all other medications  *If you need a refill on your cardiac medications before your next appointment, please call your pharmacy*   Lab Work:  None ordered   Testing/Procedures:  Schedule lower ext venous dopplers   Follow-Up: At [facility], you and your health needs are our priority.  As part of our continuing mission to provide you with exceptional heart care, we have created designated Provider Care Teams.  These Care Teams include your primary Cardiologist (physician) and Advanced Practice Providers (APPs -  Physician Assistants and Nurse Practitioners) who all work together to provide you with the care you need, when you need it.  We recommend signing up for the patient portal called "MyChart".  Sign up information is provided on this After Visit Summary.  MyChart is used to connect with patients for Virtual Visits (Telemedicine).  Patients are able to view lab/test results, encounter notes, upcoming appointments, etc.  Non-urgent messages can be sent to your provider as well.   To learn more about what you can do with MyChart, go to [email or web address].    Your next appointment:  6 months   The format for your next appointment: Office    Provider:  Dr.Harding

## 2020-12-08 ENCOUNTER — Other Ambulatory Visit: Payer: Self-pay | Admitting: Cardiology

## 2020-12-08 DIAGNOSIS — I872 Venous insufficiency (chronic) (peripheral): Secondary | ICD-10-CM

## 2020-12-09 ENCOUNTER — Other Ambulatory Visit (HOSPITAL_COMMUNITY): Payer: Self-pay | Admitting: Cardiology

## 2020-12-12 ENCOUNTER — Encounter: Payer: Self-pay | Admitting: Cardiology

## 2020-12-12 NOTE — Assessment & Plan Note (Signed)
I probably will not check any more surveillance Myoview tests because they seem to be abnormal and lead to unnecessary cardiac catheterization.  He has a LIMA patent with occluded LAD, he has an occluded PDA but relatively normal RCA.  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  Not a PCI target.

## 2020-12-12 NOTE — Assessment & Plan Note (Signed)
Stable findings on echocardiogram.  Would be due for echo in 2024.  Discussed SBE prophylaxis.

## 2020-12-12 NOTE — Assessment & Plan Note (Signed)
Euvolemic on exam with no PND or orthopnea.  Trivial edema..  Relatively normal LVEDP on cath.  Plan:   Continue current dose of beta-blocker and ARB along with amlodipine.   Continue furosemide which he takes 1 to 2 tablets daily depending on weight.

## 2020-12-12 NOTE — Assessment & Plan Note (Addendum)
At this point, symptomatically he seems to be maintaining sinus rhythm.    Currently wearing a monitor first and reassess.  We also discussed a home monitor that he can use to identify A. fib if he has breakthrough episodes.  Plan:  Continue rate control with 25 mg Toprol.  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  Goal INR 2.5-3.5.  PRN amiodarone for breakthrough A. fib.    He usually has symptoms, but we discussed purchasing a home monitor (smart watch, Kardia mobile, etc.) to help ID possible Afib

## 2020-12-12 NOTE — Assessment & Plan Note (Addendum)
Hard to tell what his symptoms truly are related to.  In the hospital when I talked to him, it sounds like his symptoms may be consistent with pericarditis -- at that time he noted worsening symptoms lying down flat and better sitting up.  (He says the opposite today) No evidence of pericardial effusion on echo, and the rub on exam.  So not a clear diagnosis.  Was empirically treated with colchicine and has had some improvement of symptoms.  Unfortunately, at this point he is having GI distress likely related to colchicine.  Plan: Discontinue colchicine when current bottle complete..  Chest pain was evaluated with a cardiac catheterization because Myoview which did not had correlating Findings with Myoview twice

## 2020-12-12 NOTE — Assessment & Plan Note (Addendum)
He has native and graft disease.  But stable now with 2 Myoview suggesting abnormalities confirmed with cardiac catheterization and no change.   In the future, I would asked that if stress test within the future, but this will be taken into consideration.  Images looked almost identical, unless there is a new area of concern, I would not return to the Cath Lab.   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  Plan:   Continue current dose of beta-blocker and amlodipine for antianginal benefit along with Ranexa. ==> I suggested that he double up his Ranexa for couple days if he has recurrent chest pain symptoms.  Continue current dose of rosuvastatin.  He is currently on aspirin and warfarin, with stable cardiac findings, I think it would be reasonable to stop aspirin if he has any significant issues with bleeding or bruising.

## 2020-12-12 NOTE — Assessment & Plan Note (Signed)
No longer on amiodarone and reduced dose of beta-blocker which has improved some fatigue.  Still notes some fatigue, but has improved.

## 2020-12-12 NOTE — Assessment & Plan Note (Signed)
Back on warfarin.  I have removed enoxaparin from his list -> was used for bridging.

## 2020-12-12 NOTE — Assessment & Plan Note (Addendum)
On stable dose of Lasix, talked about elevating his feet and compression stockings.  Will order lower extremity venous Dopplers just to exclude significant reflux.

## 2020-12-12 NOTE — Assessment & Plan Note (Signed)
Lipids recently checked are well controlled on current dose of rosuvastatin 20 mg daily.  Should be due for recheck in roughly 6 months when he returns for follow-up.

## 2020-12-12 NOTE — Assessment & Plan Note (Signed)
He seems to doing relatively well on reduced dose of beta-blocker.  Able to get his heart rate up into the 115 bpm range despite having resting bradycardia.  He is currently wearing a monitor so we can fully evaluate his heart rate responsiveness.

## 2020-12-12 NOTE — Assessment & Plan Note (Signed)
Blood pressures well controlled today.  He says at home it is 125/65.  He is currently on stable dose of Toprol 25 mg daily (max tolerated) along with Norvasc 10 mg for blood pressure/angina, and max dose valsartan along with PRN furosemide which he takes most days.  No change.

## 2020-12-15 ENCOUNTER — Ambulatory Visit (HOSPITAL_COMMUNITY)
Admission: RE | Admit: 2020-12-15 | Discharge: 2020-12-15 | Disposition: A | Discharge status: Home / Self Care | Payer: Medicare HMO | Source: Ambulatory Visit | Attending: Cardiology | Admitting: Cardiology

## 2020-12-15 ENCOUNTER — Other Ambulatory Visit: Payer: Self-pay

## 2020-12-15 DIAGNOSIS — I872 Venous insufficiency (chronic) (peripheral): Secondary | ICD-10-CM

## 2020-12-17 ENCOUNTER — Ambulatory Visit (INDEPENDENT_AMBULATORY_CARE_PROVIDER_SITE_OTHER): Payer: Medicare HMO | Admitting: Pharmacist Clinician (PhC)/ Clinical Pharmacy Specialist

## 2020-12-17 DIAGNOSIS — Z952 Presence of prosthetic heart valve: Secondary | ICD-10-CM

## 2020-12-17 DIAGNOSIS — Z7901 Long term (current) use of anticoagulants: Secondary | ICD-10-CM

## 2020-12-17 LAB — POCT INR: INR: 5.6 — AB (ref 2.0–3.0)

## 2020-12-23 ENCOUNTER — Ambulatory Visit (INDEPENDENT_AMBULATORY_CARE_PROVIDER_SITE_OTHER): Payer: Medicare HMO | Admitting: Cardiology

## 2020-12-23 DIAGNOSIS — Z952 Presence of prosthetic heart valve: Secondary | ICD-10-CM

## 2020-12-23 DIAGNOSIS — Z7901 Long term (current) use of anticoagulants: Secondary | ICD-10-CM | POA: Diagnosis not present

## 2020-12-23 LAB — POCT INR: INR: 3.2 — AB (ref 2.0–3.0)

## 2020-12-30 ENCOUNTER — Telehealth: Payer: Self-pay

## 2020-12-30 MED ORDER — ENOXAPARIN SODIUM 100 MG/ML IJ SOSY: 100.0000 mg | PREFILLED_SYRINGE | Freq: Two times a day (BID) | INTRAMUSCULAR | 0 refills | Status: DC

## 2020-12-30 NOTE — Telephone Encounter (Signed)
Pt caled and lmom me stating that he has upcoming procedure (didn't specify as to what) but also mentioned he needs more lovenox and a new schedule to follow for the bridge. Will route to pharmd pool for advisement

## 2020-12-30 NOTE — Telephone Encounter (Signed)
Spoke with patient.  He has endoscopy scheduled with [facility] for Sept 13.   Advised he needs to call them and have them send clearance request to our office.  Patient agreeable with this.  He is aware of the need for bridging and once we get the clearance request and confirm date, we can set up bridge protocol for him.  He notes still has 8 syringes left from last procedure, will send rx for another 10 doses to his pharmacy.  He will repeat home INR check tonight or tomorrow.

## 2020-12-31 ENCOUNTER — Ambulatory Visit (INDEPENDENT_AMBULATORY_CARE_PROVIDER_SITE_OTHER): Payer: Medicare HMO | Admitting: Cardiology

## 2020-12-31 DIAGNOSIS — Z952 Presence of prosthetic heart valve: Secondary | ICD-10-CM

## 2020-12-31 DIAGNOSIS — Z7901 Long term (current) use of anticoagulants: Secondary | ICD-10-CM

## 2020-12-31 LAB — POCT INR: INR: 3.2 — AB (ref 2.0–3.0)

## 2020-12-31 NOTE — Patient Instructions (Signed)
Description   Continue taking 7.5 mg daily except 10 mg each Monday, Wednesday and Friday.  Repeat INR in 1 week

## 2021-01-05 ENCOUNTER — Telehealth: Payer: Self-pay | Admitting: Pharmacist Clinician (PhC)/ Clinical Pharmacy Specialist

## 2021-01-05 NOTE — Telephone Encounter (Signed)
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    Procedure: EGD Date of procedure: 01/19/21   CHA2DS2-VASc Score = 5  This indicates a 7.2% annual risk of stroke. The patient's score is based upon: CHF History: Yes HTN History: Yes Diabetes History: No Stroke History: No Vascular Disease History: Yes Age Score: 2 Gender Score: 0      CrCl 78 (with adjusted body weight) Platelet count 120.  Per office protocol, patient can hold warfarin for 5 days prior to procedure.   Patient will need bridging with Lovenox (enoxaparin) around procedure.  Patient has INR followed by [facility].  He has received bridging schedule and is aware of process.

## 2021-01-05 NOTE — Telephone Encounter (Signed)
    Patient Name: Erik Meyer  DOB: 1940/09/28 MRN: [id number]  Primary Cardiologist: Glenetta Hew, MD  Chart reviewed as part of pre-operative protocol coverage. Patient has an EGD scheduled for 01/19/2021 and we were asked to give our recommendations for holding Warfarin. Per Pharmacy and office protocol: "Patient can hold warfarin for 5 days prior to procedure.   Patient will need bridging with Lovenox (enoxaparin) around procedure. Patient has INR followed by [facility].  He has received bridging schedule and is aware of process."  I will route this recommendation to the requesting party via [facility] fax function and remove from pre-op pool.  Please call with questions.  Darreld Mclean, PA-C 01/05/2021, 8:38 AM

## 2021-01-05 NOTE — Telephone Encounter (Signed)
   [facility] Pre-operative Risk Assessment    Patient Name: Erik Meyer  DOB: 08/01/1940 MRN: [id number]  HEARTCARE STAFF:  - IMPORTANT!!!!!! Under Visit Info/Reason for Call, type in Other and utilize the format Clearance MM/DD/YY or Clearance TBD. Do not use dashes or single digits. - Please review there is not already an duplicate clearance open for this procedure. - If request is for dental extraction, please clarify the # of teeth to be extracted. - If the patient is currently at the dentist's office, call Pre-Op Callback Staff (MA/nurse) to input urgent request.  - If the patient is not currently in the dentist office, please route to the Pre-Op pool.  Request for surgical clearance:  What type of surgery is being performed? EGD  When is this surgery scheduled? 01/19/21  What type of clearance is required (medical clearance vs. Pharmacy clearance to hold med vs. Both)? pharmacy  Are there any medications that need to be held prior to surgery and how long? Warfarin 5 days  Practice name and name of physician performing surgery? [facility]  What is the office phone number? [phone]   7.   What is the office fax number? [phone]  8.   Anesthesia type (None, local, MAC, general) ? unknown   Tommy Medal 01/05/2021, 8:14 AM  _________________________________________________________________   (provider comments below)

## 2021-01-07 ENCOUNTER — Telehealth: Payer: Self-pay

## 2021-01-07 LAB — POCT INR: INR: 2.6 (ref 2.0–3.0)

## 2021-01-07 NOTE — Telephone Encounter (Signed)
Lmom for overdue inr 

## 2021-01-08 ENCOUNTER — Ambulatory Visit (INDEPENDENT_AMBULATORY_CARE_PROVIDER_SITE_OTHER): Payer: Medicare HMO | Admitting: Cardiovascular Disease

## 2021-01-08 DIAGNOSIS — Z7901 Long term (current) use of anticoagulants: Secondary | ICD-10-CM | POA: Diagnosis not present

## 2021-01-08 DIAGNOSIS — Z952 Presence of prosthetic heart valve: Secondary | ICD-10-CM

## 2021-01-25 ENCOUNTER — Ambulatory Visit (INDEPENDENT_AMBULATORY_CARE_PROVIDER_SITE_OTHER): Payer: Medicare HMO | Admitting: Internal Medicine

## 2021-01-25 DIAGNOSIS — Z7901 Long term (current) use of anticoagulants: Secondary | ICD-10-CM | POA: Diagnosis not present

## 2021-01-25 DIAGNOSIS — Z952 Presence of prosthetic heart valve: Secondary | ICD-10-CM

## 2021-01-25 LAB — POCT INR: INR: 1.9 — AB (ref 2.0–3.0)

## 2021-01-27 ENCOUNTER — Other Ambulatory Visit: Payer: Self-pay | Admitting: Cardiology

## 2021-01-29 ENCOUNTER — Ambulatory Visit (INDEPENDENT_AMBULATORY_CARE_PROVIDER_SITE_OTHER): Payer: Medicare HMO | Admitting: Cardiovascular Disease

## 2021-01-29 DIAGNOSIS — Z7901 Long term (current) use of anticoagulants: Secondary | ICD-10-CM | POA: Diagnosis not present

## 2021-01-29 DIAGNOSIS — Z952 Presence of prosthetic heart valve: Secondary | ICD-10-CM

## 2021-01-29 LAB — POCT INR: INR: 2.3 (ref 2.0–3.0)

## 2021-02-05 ENCOUNTER — Ambulatory Visit (INDEPENDENT_AMBULATORY_CARE_PROVIDER_SITE_OTHER): Payer: Medicare HMO | Admitting: Cardiology

## 2021-02-05 ENCOUNTER — Telehealth: Payer: Self-pay

## 2021-02-05 DIAGNOSIS — Z7901 Long term (current) use of anticoagulants: Secondary | ICD-10-CM

## 2021-02-05 DIAGNOSIS — Z952 Presence of prosthetic heart valve: Secondary | ICD-10-CM

## 2021-02-05 LAB — POCT INR: INR: 2.9 (ref 2.0–3.0)

## 2021-02-05 NOTE — Telephone Encounter (Signed)
Informed patient to check INR today.  Verbalized understanding

## 2021-02-12 LAB — POCT INR: INR: 3.3 — AB (ref 2.0–3.0)

## 2021-02-13 ENCOUNTER — Other Ambulatory Visit: Payer: Self-pay | Admitting: Cardiology

## 2021-02-14 NOTE — Telephone Encounter (Signed)
Erik Meyer - these 2 episodes are indeed Atrial fibrillation.  2 QUESTIONS -   1) how long are these spells lasting? 2) have you taken the Amiodarone when these spells occur?  Plan for breakthrough spells like this are - take Amiodarone 200mg  (2 tabs twice daily) until out of Afib. Then reduce to 1 tab twice daily x 1 week -> then once daily x 1 week.  This way the Amiodarone helps convert you out of Atrial Fibrillation & the additional days keeps it from recurring.    Glenetta Hew, MD

## 2021-02-16 NOTE — Telephone Encounter (Signed)
It is possible that desipramine may have been related to his breakthrough episodes of A. fib, but I will not review with our pharmacist(HAVE CC'D Kristing Alvstad).  Number both of the episodes on  the Kardia monitor do look like A. fib.  These would be the episodes that he would take the amiodarone for.  It seems like the second time around it did work.  Glenetta Hew, MD

## 2021-02-18 ENCOUNTER — Ambulatory Visit (INDEPENDENT_AMBULATORY_CARE_PROVIDER_SITE_OTHER): Payer: Medicare HMO | Admitting: Cardiology

## 2021-02-18 DIAGNOSIS — Z952 Presence of prosthetic heart valve: Secondary | ICD-10-CM

## 2021-02-18 DIAGNOSIS — Z7901 Long term (current) use of anticoagulants: Secondary | ICD-10-CM | POA: Diagnosis not present

## 2021-02-26 LAB — POCT INR: INR: 3.1 — AB (ref 2.0–3.0)

## 2021-03-01 ENCOUNTER — Ambulatory Visit (INDEPENDENT_AMBULATORY_CARE_PROVIDER_SITE_OTHER): Payer: Medicare HMO | Admitting: Cardiology

## 2021-03-01 DIAGNOSIS — Z5181 Encounter for therapeutic drug level monitoring: Secondary | ICD-10-CM

## 2021-03-01 NOTE — Patient Instructions (Signed)
Description    Called and spoke to pt and instructed him to continue taking warfarin 7.5 mg daily except 10 mg each Monday, Wednesday and Friday.  Repeat INR 2 weeks. [facility] [phone]

## 2021-03-03 ENCOUNTER — Ambulatory Visit: Payer: Medicare HMO | Admitting: Cardiology

## 2021-03-04 ENCOUNTER — Other Ambulatory Visit: Payer: Self-pay | Admitting: Cardiology

## 2021-03-12 ENCOUNTER — Ambulatory Visit (INDEPENDENT_AMBULATORY_CARE_PROVIDER_SITE_OTHER): Payer: Medicare HMO | Admitting: Cardiology

## 2021-03-12 DIAGNOSIS — Z952 Presence of prosthetic heart valve: Secondary | ICD-10-CM

## 2021-03-12 DIAGNOSIS — Z7901 Long term (current) use of anticoagulants: Secondary | ICD-10-CM

## 2021-03-12 LAB — POCT INR: INR: 3 (ref 2.0–3.0)

## 2021-03-16 NOTE — Telephone Encounter (Signed)
To be honest, I am not sure of any potential interaction here.  I am forwarding this to my clinical pharmacist to see if she has any knowledge on this.  Glenetta Hew, MD

## 2021-03-18 NOTE — Telephone Encounter (Signed)
Spoke with patient - he has stopped the desipramine, it's not working very well anyway.  Advised that it could be the cause of his dizziness and increased heart rate due to the interaction with ranolazine.    Suggested that if his neurologist/psychiatrist decides to try something else, he is welcome to call and check with [place] before taking.

## 2021-03-19 NOTE — Telephone Encounter (Signed)
Called patient, he had again stopped the desipramine.  Advised that he not resume, or if he wants to, to use a lower dose and see if that will help with headaches w/o affecting heart.  Patient noted he was planning to speak to neurologist again.

## 2021-03-26 ENCOUNTER — Ambulatory Visit (INDEPENDENT_AMBULATORY_CARE_PROVIDER_SITE_OTHER): Payer: Medicare HMO | Admitting: Cardiovascular Disease

## 2021-03-26 DIAGNOSIS — Z7901 Long term (current) use of anticoagulants: Secondary | ICD-10-CM

## 2021-03-26 DIAGNOSIS — Z952 Presence of prosthetic heart valve: Secondary | ICD-10-CM

## 2021-03-26 LAB — POCT INR: INR: 4.5 — AB (ref 2.0–3.0)

## 2021-03-30 ENCOUNTER — Ambulatory Visit (INDEPENDENT_AMBULATORY_CARE_PROVIDER_SITE_OTHER): Payer: Medicare HMO | Admitting: Internal Medicine

## 2021-03-30 DIAGNOSIS — I480 Paroxysmal atrial fibrillation: Secondary | ICD-10-CM

## 2021-03-30 DIAGNOSIS — Z5181 Encounter for therapeutic drug level monitoring: Secondary | ICD-10-CM | POA: Diagnosis not present

## 2021-03-30 LAB — POCT INR: INR: 2.5 (ref 2.0–3.0)

## 2021-03-30 NOTE — Patient Instructions (Addendum)
Description    Called and spoke to pt and instructed him to continue taking warfarin 7.5 mg daily except 10 mg each Monday, Wednesday and Friday.  Repeat INR 2 weeks. [facility] [phone]

## 2021-04-13 LAB — POCT INR: INR: 3.8 — AB (ref 2.0–3.0)

## 2021-04-14 ENCOUNTER — Ambulatory Visit (INDEPENDENT_AMBULATORY_CARE_PROVIDER_SITE_OTHER): Payer: Medicare HMO | Admitting: Cardiovascular Disease

## 2021-04-14 DIAGNOSIS — Z7901 Long term (current) use of anticoagulants: Secondary | ICD-10-CM | POA: Diagnosis not present

## 2021-04-14 DIAGNOSIS — Z952 Presence of prosthetic heart valve: Secondary | ICD-10-CM

## 2021-04-21 ENCOUNTER — Other Ambulatory Visit: Payer: Self-pay | Admitting: Cardiology

## 2021-04-28 ENCOUNTER — Ambulatory Visit (INDEPENDENT_AMBULATORY_CARE_PROVIDER_SITE_OTHER): Payer: Medicare HMO | Admitting: Cardiology

## 2021-04-28 ENCOUNTER — Telehealth: Payer: Self-pay

## 2021-04-28 DIAGNOSIS — Z7901 Long term (current) use of anticoagulants: Secondary | ICD-10-CM

## 2021-04-28 DIAGNOSIS — Z952 Presence of prosthetic heart valve: Secondary | ICD-10-CM

## 2021-04-28 LAB — POCT INR: INR: 5 — AB (ref 2.0–3.0)

## 2021-04-28 NOTE — Telephone Encounter (Signed)
Spoke to patient and reminded him to check INR.  Verbalized understanding

## 2021-05-05 ENCOUNTER — Ambulatory Visit (INDEPENDENT_AMBULATORY_CARE_PROVIDER_SITE_OTHER): Payer: Medicare HMO

## 2021-05-05 DIAGNOSIS — Z7901 Long term (current) use of anticoagulants: Secondary | ICD-10-CM

## 2021-05-05 DIAGNOSIS — Z952 Presence of prosthetic heart valve: Secondary | ICD-10-CM

## 2021-05-05 LAB — POCT INR: INR: 4.7 — AB (ref 2.0–3.0)

## 2021-05-05 NOTE — Patient Instructions (Signed)
Description   Called and spoke to pt and instructed him to [place] only and only take 1 tablet tomorrow and then START taking to 7.5 mg daily.  Repeat INR 1 week. [facility] [phone]

## 2021-05-12 LAB — POCT INR: INR: 2.7 (ref 2.0–3.0)

## 2021-05-13 ENCOUNTER — Ambulatory Visit (INDEPENDENT_AMBULATORY_CARE_PROVIDER_SITE_OTHER): Payer: Medicare HMO | Admitting: Cardiology

## 2021-05-13 DIAGNOSIS — Z7901 Long term (current) use of anticoagulants: Secondary | ICD-10-CM

## 2021-05-13 DIAGNOSIS — Z952 Presence of prosthetic heart valve: Secondary | ICD-10-CM

## 2021-05-26 ENCOUNTER — Other Ambulatory Visit: Payer: Self-pay | Admitting: Cardiology

## 2021-05-27 ENCOUNTER — Ambulatory Visit (INDEPENDENT_AMBULATORY_CARE_PROVIDER_SITE_OTHER): Payer: Medicare HMO | Admitting: Internal Medicine

## 2021-05-27 DIAGNOSIS — Z952 Presence of prosthetic heart valve: Secondary | ICD-10-CM

## 2021-05-27 DIAGNOSIS — Z7901 Long term (current) use of anticoagulants: Secondary | ICD-10-CM | POA: Diagnosis not present

## 2021-05-27 LAB — POCT INR: INR: 2.9 (ref 2.0–3.0)

## 2021-06-06 ENCOUNTER — Other Ambulatory Visit: Payer: Self-pay | Admitting: Cardiology

## 2021-06-10 ENCOUNTER — Ambulatory Visit (INDEPENDENT_AMBULATORY_CARE_PROVIDER_SITE_OTHER): Payer: Medicare HMO | Admitting: Cardiovascular Disease

## 2021-06-10 DIAGNOSIS — Z7901 Long term (current) use of anticoagulants: Secondary | ICD-10-CM | POA: Diagnosis not present

## 2021-06-10 DIAGNOSIS — Z952 Presence of prosthetic heart valve: Secondary | ICD-10-CM

## 2021-06-10 LAB — POCT INR: INR: 2.6 (ref 2.0–3.0)

## 2021-06-14 ENCOUNTER — Ambulatory Visit: Payer: Medicare HMO | Admitting: Cardiology

## 2021-06-18 ENCOUNTER — Encounter: Payer: Self-pay | Admitting: Cardiology

## 2021-06-18 ENCOUNTER — Telehealth: Payer: Self-pay | Admitting: Cardiology

## 2021-06-18 NOTE — Telephone Encounter (Signed)
The patient called in with complaints of chest pain for the last three days. He denied current chest pain while on the phone. The pain is in the center of his chest and does not radiate. He denies any other symptoms. The pain comes and goes despite being at rest or ambulating. He stated that he has been to [facility] ED several times and was told that everything was normal. He stated that he will not go back to the Emergency Room for this.   He is currently on Ranexa 500 mg bid. According to the last office visit with Dr. Ellyn Hack in August 2022, he can double his Ranexa for a couple of days if he has chest pain. He stated that he took 3 pills yesterday. One in the morning and two in the pm. He did only take one this morning.   He has been advised that he can double the Ranexa for a couple of days, per the instruction, to see if that helps. He stated that he would like to know if there is another medication that he can try instead.  His Kardia states normal rhythm.   Patient has been made aware of ED precautions should new or worsening symptoms occur. Patient verbalized understanding.

## 2021-06-18 NOTE — Telephone Encounter (Signed)
Pt c/o of Chest Pain: STAT if CP now or developed within 24 hours  1. Are you having CP right now? yes  2. Are you experiencing any other symptoms (ex. SOB, nausea, vomiting, sweating)? no  3. How long have you been experiencing CP? 3 days   4. Is your CP continuous or coming and going? Continuous   5. Have you taken Nitroglycerin? No  ?     Dr Ellyn Hack I have been having chest pain for 3 days and the Ranolazine does not help much. Is there something you can prescribe that might be more effective. I am not in afib.Marland Kitchen

## 2021-06-19 NOTE — Telephone Encounter (Signed)
I truthfully think that the pain he is having if is not exertional is probably not related to coronary artery disease.  If he is getting some relief with Ranexa, then he should just stay on the double dose going forward.  Glenetta Hew, MD

## 2021-06-20 NOTE — Telephone Encounter (Signed)
Options are Imdur or Isrodil   Will also try Tylenol ES 2 times daily.  Glenetta Hew, MD

## 2021-06-21 HISTORY — PX: TRANSTHORACIC ECHOCARDIOGRAM: SHX275

## 2021-06-21 NOTE — Telephone Encounter (Signed)
The patient stated that he went to the ED and they stated that they did not feel like it was his heart. He will follow up with his PCP.

## 2021-06-22 NOTE — Telephone Encounter (Signed)
Discussed with Dr Allison Quarry nurse, please come in for appt 2-15 @330pm . Pt notified, informed to arrive early for check-in process.

## 2021-06-23 ENCOUNTER — Encounter: Payer: Self-pay | Admitting: Cardiology

## 2021-06-23 ENCOUNTER — Other Ambulatory Visit: Payer: Self-pay

## 2021-06-23 ENCOUNTER — Ambulatory Visit: Payer: Medicare HMO | Admitting: Cardiology

## 2021-06-23 VITALS — BP 119/58 | HR 65 | Ht 69.0 in | Wt 197.8 lb

## 2021-06-23 DIAGNOSIS — E785 Hyperlipidemia, unspecified: Secondary | ICD-10-CM

## 2021-06-23 DIAGNOSIS — I10 Essential (primary) hypertension: Secondary | ICD-10-CM

## 2021-06-23 DIAGNOSIS — I25709 Atherosclerosis of coronary artery bypass graft(s), unspecified, with unspecified angina pectoris: Secondary | ICD-10-CM

## 2021-06-23 DIAGNOSIS — I480 Paroxysmal atrial fibrillation: Secondary | ICD-10-CM

## 2021-06-23 DIAGNOSIS — R0789 Other chest pain: Secondary | ICD-10-CM | POA: Diagnosis not present

## 2021-06-23 DIAGNOSIS — I25119 Atherosclerotic heart disease of native coronary artery with unspecified angina pectoris: Secondary | ICD-10-CM

## 2021-06-23 DIAGNOSIS — Z951 Presence of aortocoronary bypass graft: Secondary | ICD-10-CM | POA: Diagnosis not present

## 2021-06-23 DIAGNOSIS — Z952 Presence of prosthetic heart valve: Secondary | ICD-10-CM

## 2021-06-23 DIAGNOSIS — I5032 Chronic diastolic (congestive) heart failure: Secondary | ICD-10-CM

## 2021-06-23 DIAGNOSIS — D6869 Other thrombophilia: Secondary | ICD-10-CM

## 2021-06-23 DIAGNOSIS — I4891 Unspecified atrial fibrillation: Secondary | ICD-10-CM

## 2021-06-23 MED ORDER — PREDNISONE 10 MG PO TABS: ORAL_TABLET | ORAL | 0 refills | Status: AC

## 2021-06-23 NOTE — Patient Instructions (Addendum)
Medication Instructions:     Do steroid taper take with food   Prednisone 60 mg ( take  6 tablets of 10 mg) daily for 2 days Then 40 mg ( take 4 tablets of 10 mg ) daily for 2 days  Then 20 mg ( take 2 tablets of 10 mg ) daily for 2 days  Then 10 mg ( take 1 tablets of 10 mg ) daily)for 2 days  then stop taking medication   *If you need a refill on your cardiac medications before your next appointment, please call your pharmacy*   Lab Work:  Not needed   Testing/Procedures: Not needed   Follow-Up: At [facility], you and your health needs are our priority.  As part of our continuing mission to provide you with exceptional heart care, we have created designated Provider Care Teams.  These Care Teams include your primary Cardiologist (physician) and Advanced Practice Providers (APPs -  Physician Assistants and Nurse Practitioners) who all work together to provide you with the care you need, when you need it.     Your next appointment:   2 month(s) April   keep appointment  The format for your next appointment:   In Person  Provider:   Glenetta Hew, MD

## 2021-06-23 NOTE — Progress Notes (Signed)
Primary Care Provider: Ivan Anchors, MD Cardiologist: Glenetta Hew, MD Electrophysiologist: None  Clinic Note: Chief Complaint  Patient presents with   Hospitalization Follow-up    Admitted for chest pain.  Normal echo.  Negative troponins.   Coronary Artery Disease    Nonexertional chest pain worse lying down.    ===================================  ASSESSMENT/PLAN   Problem List Items Addressed This Visit       Cardiology Problems   Coronary artery disease involving coronary bypass graft of native heart with angina pectoris (HCC) (Chronic)   Paroxysmal-persistent atrial fibrillation (HCC); CHA2DS2-VASc Score 7 (CHF, HTN, VascDz/CAD, >75 -2, TIA - 2) (Chronic)    Has been maintaining sinus rhythm for quite a long time now.  Doing well off of amiodarone.  Rate controlled with low-dose Toprol.  He is on warfarin for prosthetic aortic valve.  Plan: Continue rate control with Toprol 25 mg daily. Continue warfarin (goal INR 2.5-3.5) PRN Amiodarone for breakthrough A-fib (he is able identify symptoms, and confirms with [facility]) => PRN Amiodarone Load 400 mg twice daily x5 days (if not converted, continue 200 mg twice daily for 5 days)      Relevant Orders   EKG 12-Lead (Completed)   Chronic diastolic heart failure (HCC) (Chronic)    Seems euvolemic on exam today.  No PND, orthopnea and trivial edema.  Echo relatively benign.  Plan: Continue current dose of Toprol, valsartan and and amlodipine for blood pressure/afterload reduction.  Has been converted to standing dose of Lasix.  I think we probably will be to go back to PRN once he comes out of this episode.       Coronary artery disease involving native coronary artery of native heart with angina pectoris (HCC) (Chronic)    He has both native and graft disease.  He has had 2 stable Myoview's suggesting minimal peri-infarct ischemia with cardiac catheterization confirming these findings.  No obvious PCI options.  I  do not think that this current chest pain episode that he had is at all related to cardiac symptoms.  It does not seem cardiac in nature.  Not exertional.  Worse with lying down.  I suspect this probably musculoskeletal.  He does on occasion have true exertional chest pain but that has been relatively well controlled with a combination of Toprol, amlodipine and Ranexa. 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  Plan: Continue current dose of amlodipine 10 mg daily, Toprol 25 mg daily and Ranexa 5 mg twice daily. Continue valsartan and for afterload reduction to 320 mg daily. Continue rosuvastatin 20 mg daily with excellent lipid management. Not on aspirin or Plavix because of warfarin.  (We recently stopped aspirin)        Relevant Orders   EKG 12-Lead (Completed)   Dyslipidemia, goal LDL below 70 (Chronic)    He actually just had fasting lipids checked during his recent hospitalization.  LDL 47 stable if not improved from July 2022.  Tolerating rosuvastatin well.  Excellent lipid control.  Plan: Continue rosuvastatin 20 mg daily      Hypertension, essential (Chronic)    Blood pressure is well controlled today.  Remains on amlodipine 10 mg and Toprol 25 mg along with valsartan 320 mg.  Changed to daily Lasix.      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  Maintain INR 2.5-3.5.  No bleeding issues.  In the past we will use enoxaparin for bridging if procedure is needed. => Bridging recommendations assisted by CV[facility] Pharm D Team.        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 He just had an echocardiogram done showing stable valve.  Did not comment on the prosthesis.  But no significant stenosis noted.  Can hold off on follow-up now till 2025.  Discussed SBE prophylaxis.   (Ampicillin ordered) On warfarin with goal INR 2.5-3.5       S/P CABG x 4 (Chronic)    Recent cath and echo reviewed.  See CAD section.      Relevant Orders   EKG 12-Lead (Completed)   Anterior chest wall pain - Primary    At this point, I am convinced that his ongoing chest pain is not cardiac in nature.  This seemingly musculoskeletal in nature.  We will try a steroid taper: Prednisone 60 mg daily X 2 days, 40 mg x 2 days, 20 mg x 2 days then 10 mg x 2 days       ===================================  HPI:    Erik Meyer is a 81 y.o. male with a PMH below who presents today for Hospital Follow-Up/Chest Pain Evaluation.  CV History:  2004: MV-CAD-> s/p CABG/AVR/aortoplasty in 2004 => by PAF (on warfarin for mechanical valve), cardiomyopathy and chronic fatigue  Cardiac Cath 04/11/2017: Native RCA ~patent to PAV/PL, Native LM-LCx(OM1 & OM2), LAD-D1 & D2 then 100% after SP2). Patent LIMA-dLAD, occluded SeqSVG-RPDA-PL & SVG-D2.  September 2020:  Noted fatigue and exercise intolerance => GXT with no evidence of chronotropic incompetence.Echo showed EF stable at 45 to 50%.  Weaned off of amiodarone, rate control with Toprol/lower dose  => weaned off of amiodarone, maintained on Toprol 25-> less fatigue  PLAN: Allow Permissive Hypertension; PRN amiodarone load for breakthrough A-fib Convert statin to a rosuvastatin for better lipid management  Myoview 10/28/20: Findings c/w Inferior-Inferolateral & Anterior-anteroapical Infarct w/ peri-infarct Ischemia (INTERMEDIATE RISK - but similar to 2018).  CATH 11/11/2020 for ? Unstable Angina - Stable finding on Cath & Echo. (See image below) => findings correlate with Myoview findings.  DALLIS CZAJA was last seen on 12/07/2020 for post-cath follow-up.  Somewhat perplexed because of stable findings.  We will treat empirically for possible pericarditis with colchicine, but started having lots of GI issues. => Noted pain was worse sitting up  versus lying down (opposite from what he mentioned in the hospital).  Pain was not exertional.  Back to doing exercise-heart rate up to 100 -115 bpm on treadmill or stationary bike.  No breakthrough chest pain episodes.  No breakthrough A-fib. => Stop colchicine.  Recent Hospitalizations:  2/12-13/2023: Admitted to [facility] with chest pain associate with dizziness and fatigue.  Noted to have hyponatremia (sodium 126).  Troponin levels were flat and normal.-> converted simvastatin to rosuvastatin; no further testing.  Recommend holding Lasix for couple days.  Reviewed  CV studies:    The following studies were reviewed today: (if available, images/films reviewed: From Epic Chart or Care Everywhere) Echo Cherrie Gauze): 06/21/2021 Left Ventricle: Systolic function is normal. EF: 60-65%.    Left Ventricle: Doppler parameters consistent with mild diastolic dysfunction and low to normal LA pressure.    Left Atrium: Left atrium is moderately dilated.    Right Ventricle: Right ventricle is moderately dilated.    Right Atrium: Right atrium is moderately to severely  dilated.    IVC/SVC: The IVC is not well visualized. IVC diameter is near the upper limit of normal. IVC diameter seems to decrease less than 50%.    Aortic Valve: There is a suggestion of very mild AI.    Tricuspid Valve: There is mild regurgitation.    Tricuspid Valve: The right ventricular systolic pressure is normal (<36 mmHg).  CTA Chest: 06/20/2021 1.  No pulmonary embolism.  2.  Left lower lobe nodule, new since prior exam, see below for follow-up recommendations.  3.  Pulmonary emphysema.   Interval History:   BRNADON EOFF presents here for hospital follow-up overall stating that he feels better now.  He still notes that the discomfort in his lower chest feels better with lying down, sitting up.  He Describes it as a symptom that is all across his chest sometimes in the right side sometimes left but  usually ends up in the left lateral chest.  He says is worse in the morning when he first wakes up but he can be there all day.  Overall he is doing better.  It is not exertional in nature.  He is back to doing exercise.  He was little frustrated at the recommendations in the hospital they were very confused as to what he was doing with his amiodarone (he did not follow any suggestions about that.  He knows what he needs to do with this.).  They actually recommend that he take his Lasix every day.  He denies any PND orthopnea with relatively stable/minimal lower extremity swelling.  Thankfully, he has not had any breakthrough spells of A-fib.  Really no significant palpitations.  CV Review of Symptoms (Summary) Cardiovascular ROS: positive for - chest pain and -this is not exertional.  Worse with lying down.  Mild exercise intolerance, but notably pretty much stable negative for - dyspnea on exertion, edema, irregular heartbeat, orthopnea, palpitations, paroxysmal nocturnal dyspnea, rapid heart rate, shortness of breath, or lightheadedness, dizziness, wooziness, syncope/near syncope or TIA/amaurosis fugax, claudication  REVIEWED OF SYSTEMS   Review of Systems  Constitutional:  Negative for malaise/fatigue (Just a little bit of exercise intolerance.  Actually doing pretty well.) and weight loss.  HENT:  Negative for nosebleeds.   Respiratory:  Negative for cough and shortness of breath.   Cardiovascular:        Per HPI  Gastrointestinal:  Positive for abdominal pain (Epigastric) and nausea. Negative for blood in stool and melena.  Genitourinary:  Negative for hematuria.  Musculoskeletal:  Negative for back pain and joint pain.  Neurological:  Negative for dizziness (With standing up quickly but otherwise not necessarily.) and weakness.  Psychiatric/Behavioral: Negative.     I have reviewed and (if needed) personally updated the patient's problem list, medications, allergies, past medical and  surgical history, social and family history.   PAST MEDICAL HISTORY   Past Medical History:  Diagnosis Date   Anemia    CAD, multiple vessel 12/2002   Found during preop cath for [facility] AVR procedure; Myoview 5/'13: No ischemia or infarct, EF 53% --> cath December 2018: Proximal LAD 65% followed by mid LAD 65% then 100% occlusion.  Patent LIMA-LAD.  SVG-D2 occluded.  RPDA occluded. Seq SVG-r PDA-PL patent to PDA, but the PDA is occluded as is the proximal native PDA; Similar Findings 11/2020   COPD (chronic obstructive pulmonary disease) (HCC)    Diverticulosis    Dyslipidemia, goal LDL below 70    Monitored by PCP. On statin (last labs  scan from September 2015: TC 149, TG 127, HDL 55, LDL 69)   ED (erectile dysfunction)    GERD (gastroesophageal reflux disease)    History of SBO (small bowel obstruction) 07/2013   Abdominal surgery with LOA   Hx of irritable bowel syndrome    Hypertension, essential    Lesion of right native kidney 08/20/2013   Abdominal US : 2.4 cm x 1.7 complex lesion in the upper pole the right kidney in March of 2012 concerning for RCC although enlarging hemorrhagic cyst could have this appearance as well.,    Monoclonal gammopathy of undetermined significance    Paroxysmal-persistent atrial fibrillation ([place]); CHA2DS2-VASc Score 7 03/23/2017   CHA2DS2-VASc (CHF, HTN, CAD, agex2, TIAx2 = 7)-> Most recent episode January 3-5, 2022; unsuccessful cardioversion with amiodarone; -> went to ER-synchronized DCCV x1   Psoriasis    S/P AVR (aortic valve replacement) and aortoplasty 12/2002   Dr. Cyndia BentDeneen Harts procedure - St. Jude aVR (25 mm prosthesis) with aortic root conduit;; Echo 10/'14: EF 55-60%, Mod Conc LVH, Gr 1 DD, Well Seated AoV Mech Prosthesis with normal P gradients (no stenosis), Mod-Severe LA dilation --> follow-up echo November 2016: Well functioning mechanical valve. Paradoxical septal motion. EF 50-55%. Severe LA dilation. Moderate RA dilation.    S/P CABG  x 4 12/2002   LIMA-LAD, SVG-D1, SVG- PDA-PLA (along with AVR); SVG-Diag & Seq SVG-rPDA-PL both CTO (2018)    PAST SURGICAL HISTORY   Past Surgical History:  Procedure Laterality Date   AORTIC VALVE REPLACEMENT  12/26/2002   CG mechanical mechanical AVR as part of Bentall procedure   BENTALL PROCEDURE  12/26/2002   Aortic valve replacement and replacement of aortic root aneurysm using a 25 mm St. JUDE mechanical valve conduit with reimplantation of the coronary arteries   CARDIAC CATHETERIZATION  12/20/2002   diffuse  coronary disease,prior to his BENTALL PROCEDURE   CORONARY ARTERY BYPASS GRAFT  12/26/2002   LIMA to LAD,SVG to diagonal, SVG to PDA and PLA.    CORONARY/GRAFT ANGIOGRAPHY N/A 04/21/2017   Procedure: CORONARY/GRAFT ANGIOGRAPHY;  Surgeon: Leonie Man, MD;  Location: [facility] [facility];  Service: Cardiovascular; Native RCA ~patent to PAV/PL, Native LM-LCx(OM1 & OM2), LAD-D1 & D2 then 100% after SP2). Patent LIMA-dLAD, occluded SeqSVG-RPDA-PL & SVG-D2. --. FINDINGS CORELATED WELL WITH PERFUSION DEFECT SEEN ON MYOVIEW - INFERIOR INFARCT & ANTERIOR INFARCT. --Med Rx   EXERCISE TOLERANCE TEST  09/12/2017    5 METS Baseline ST depression.  No new changes with exercise.  Heart rate went to 141 bpm.  Poor exercise tolerance reaching target heart rate at 2:26-minute,.  No chronotropic Incompetence   LAPAROSCOPIC LYSIS OF ADHESIONS  07/2013   performed in [place] for SBO    LEFT HEART CATH AND CORONARY ANGIOGRAPHY N/A 11/11/2020   Procedure: LEFT HEART CATH AND CORONARY ANGIOGRAPHY;  Surgeon: Leonie Man, MD;  Location: [facility];  Service: Cardiovascular;   pLAD 65%, mLAd 65% (stable) -> 100% CTO. Patent LIMA-dLAD. Ost rPDA 100% CTO with patent RPL system.. SVG-D2 100%. SVG-rPDA-PL occluded. Native LCx - ~normal.  STABLE FINDINGS (Still correlate with Myoview)   NM MYOVIEW LTD  10/04/2011   09/2011: showed no ischemia;EF52%; 03/2017 - [facility] [place].  Mild LV dilation.  EF 45 to 50%..  Concern for prior infarct in the inferior lateral wall and apical anterior wall consistent with infarct with peri-infarct ischemia. => On review, these findings correlate with cardiac catheter showing inferior infarct and anterior  infarct.   NM MYOVIEW LTD  10/28/2020   Equivocal Findings;   EF ~45-50%. No EKG changes. Medium Size - Severe defect - basal inferolateral & mid inferolateral. Also Medium Size-Severe defect in mid Anterior, apical Anterior & Apex. C/w Prior MI with Peri-Infarct Ischemia. - INTERMEDIATE RISK - STABLE FROM 2018   PARTIAL COLECTOMY  1994   THORACIC AORTIC ANEURYSM REPAIR  2004   Saint Jude aortic valve conduit-Bentall AVR   TRANSTHORACIC ECHOCARDIOGRAM  06/21/2021   ([place]) EF 60 to 65%.  Mild diastolic dysfunction.  Normal LAP.  Moderate LA dilation.  Moderate RV dilation.  Moderate-severe RA dilation.  Very mild AI.   TRANSTHORACIC ECHOCARDIOGRAM  11/2018    Stable EF 45-50%-moderate HK of apical-anteroseptal wall..  Moderate asymmetric LVH with GR 1 DD.  Normal RV.  RA mildly dilated.  Moderate MAC.   TRANSTHORACIC ECHOCARDIOGRAM  11/11/2020   EF 45 to 50%.  Mildly decreased function.  Inferolateral hypokinesis and small area of anteroseptal wall motion abnormality.  RV is borderline size with moderate dysfunction-normal CVP.Marland Kitchen  Both atria are moderately dilated.  25 mm bileaflet mechanical aortic valve in place.  Normal function and normal gradients..   VENTRAL HERNIA REPAIR     repair with mesh by Dr Collene Mares at [place] [facility] 11/11/2020: pLAD 65%, mLAd 65% (stable) -> 100% CTO. Patent LIMA-dLAD. Ost rPDA 100% CTO with patent RPL system.. SVG-D2 100%. SVG-rPDA-PL occluded. Native LCx - ~normal.  STABLE FINDINGS      There is no immunization history on file for this patient.  MEDICATIONS/ALLERGIES   Current Meds  Medication Sig   amiodarone (PACERONE) 200 MG tablet Take 2 tablets (400 mg total) by mouth 2 (two)  times daily as needed (atrial fibrillation).   amLODipine (NORVASC) 10 MG tablet TAKE 1 TABLET EVERY DAY   ampicillin (PRINCIPEN) 500 MG capsule Take 500 mg by mouth every evening.   betamethasone dipropionate (DIPROLENE) 0.05 % ointment Apply 1 application topically 2 (two) times daily as needed (psoriasis).   Cyanocobalamin (RA VITAMIN B12) 2000 MCG TBCR Take 2,000 mcg by mouth in the morning.   DENTA 5000 PLUS 1.1 % CREA dental cream Place 1 application onto teeth at bedtime.   desipramine (NOPRAMIN) 10 MG tablet Take 10 mg by mouth at bedtime.   esomeprazole (NEXIUM) 20 MG capsule Take 20 mg by mouth in the morning.   ferrous sulfate 325 (65 FE) MG tablet Take 325 mg by mouth every evening.   finasteride (PROSCAR) 5 MG tablet Take 5 mg by mouth daily.   furosemide (LASIX) 20 MG tablet TAKE 1 TO 2 TABLETS EVERY DAY IF NEEDED   LORazepam (ATIVAN) 0.5 MG tablet Take 0.5 mg by mouth in the morning, at noon, and at bedtime.   metoprolol succinate (TOPROL-XL) 25 MG 24 hr tablet TAKE 1 TABLET EVERY DAY   metroNIDAZOLE (METROGEL) 1 % gel Apply 1 application topically daily as needed (roasacea).   nitroGLYCERIN (NITROSTAT) 0.4 MG SL tablet Place 1 tablet (0.4 mg total) under the tongue every 5 (five) minutes as needed for chest pain.   potassium chloride SA (KLOR-CON) 20 MEQ tablet TAKE 1 TABLET TWICE DAILY   predniSONE (DELTASONE) 10 MG tablet Take 6 tablets (60 mg total) by mouth daily with breakfast for 2 days, THEN 4 tablets (40 mg total) daily with breakfast for 2 days, THEN 2 tablets (20 mg total) daily with breakfast for 2 days, THEN 1 tablet (10 mg total) daily with  breakfast for 2 days.   ranolazine (RANEXA) 500 MG 12 hr tablet TAKE 1 TABLET TWICE DAILY   rosuvastatin (CRESTOR) 20 MG tablet TAKE 1 TABLET EVERY DAY   tamsulosin (FLOMAX) 0.4 MG CAPS capsule Take 0.8 mg by mouth at bedtime.   Tiotropium Bromide-Olodaterol (STIOLTO RESPIMAT) 2.5-2.5 MCG/ACT AERS Inhale 2 puffs into the lungs in  the morning.   valsartan (DIOVAN) 320 MG tablet TAKE 1 TABLET EVERY DAY   warfarin (COUMADIN) 5 MG tablet TAKE 1 AND 1/2 TABLETS TO 2 TABLETS DAILY AS DIRECTED BY COUMADIN CLINIC    Allergies  Allergen Reactions   Doxycycline Rash    SOCIAL HISTORY/FAMILY HISTORY   Reviewed in Epic:  Pertinent findings:  Social History   Tobacco Use   Smoking status: Former    Types: Cigarettes    Quit date: 06/16/1997    Years since quitting: 24.0   Smokeless tobacco: Never  Vaping Use   Vaping Use: Never used  Substance Use Topics   Alcohol use: Yes   Drug use: No   Social History   Social History Narrative   Married gentleman with no children. Quit smoking in 1998.   Education: 2 years of college. He does drink caffeine.  He drinks social beer.   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 Claiborne Billings in [place]; GI: Dr. Janus Molder in [place]      **November 2020: He notes increased stress family to care for his wife who is showing signs of worsening dementia.  Hard to adjust to this and is somewhat depressing.    -He is try to work on cutting back on his intake of salt specific    OBJCTIVE -PE, EKG, labs   Wt Readings from Last 3 Encounters:  06/23/21 197 lb 12.8 oz (89.7 kg)  12/07/20 202 lb 6.4 oz (91.8 kg)  11/12/20 201 lb 14.4 oz (91.6 kg)    Physical Exam: BP (!) 119/58    Pulse 65    Ht _0  (1.753 m)    Wt 197 lb 12.8 oz (89.7 kg)    SpO2 98%    BMI 29.21 kg/m  Physical Exam Vitals reviewed.  Constitutional:      General: He is not in acute distress.    Appearance: Normal appearance. He is obese. He is not ill-appearing or toxic-appearing.  HENT:     Head: Normocephalic and atraumatic.  Neck:     Vascular: No carotid bruit or JVD.  Cardiovascular:     Rate and Rhythm: Normal rate and regular rhythm. No  extrasystoles are present.    Chest Wall: PMI is not displaced.     Pulses: Normal pulses.     Heart sounds: S1 normal and S2 normal. Heart sounds are distant. Murmur (Harsh 2/6 SEM at RUSB-neck) heard.    No friction rub. No gallop.  Pulmonary:     Effort: Pulmonary effort is normal. No respiratory distress.     Breath sounds: Normal breath sounds. No wheezing or rales.  Chest:     Chest wall: Tenderness (Left lateral chest; costosternal border) present.  Abdominal:     General: Abdomen is flat. Bowel sounds are normal. There is no distension.     Palpations: Abdomen is soft.     Tenderness: There is no abdominal tenderness.  Musculoskeletal:  General: No swelling. Normal range of motion.     Cervical back: Normal range of motion and neck supple.  Skin:    General: Skin is warm and dry.  Neurological:     General: No focal deficit present.     Mental Status: He is alert and oriented to person, place, and time.  Psychiatric:        Mood and Affect: Mood normal.        Behavior: Behavior normal.        Thought Content: Thought content normal.        Judgment: Judgment normal.     Adult ECG Report  Rate: 65 ;  Rhythm: normal sinus rhythm and 1st Deg AVB.  [facility] - age indeterminate ;   Narrative Interpretation: stabler  Recent Labs:  Trop ~neg x 3.   [place] to Basic Metabolic Panel Component 69/48/54  06/20/21   Na 127 Low  126 Low   Potassium 3.6 Low  4.3  Cl 92 Low  90 Low   CO2 25 26  AGAP 10 10  Glucose 99 133 High   BUN 14 18  Creatinine 0.83 0.94  Ca 8.5 Low  9.1    06/21/21   Hemoglobin A1c 5.3  CBC Component 06/20/21  06/20/21   WBC 2.0 Low  2.4 Low   RBC 4.01 Low  4.24  HGB 13.3 Low  14.1  HCT 36.8 Low  39.2 Low   MCV 92 93  MCH 33.2 High  33.3 High   MCHC 36.1 High  36.0  Plt Ct 108 Low  129 Low      Ref Range & Units 06/21/21   CHOLESTEROL TOTAL 100 - 199 mg/dL 139   Trig 0 - 149 mg/dL 58   HDL >=39 mg/dL 80   LDL 0 - 99  mg/dL 47   VLDL 5 - 40 mg/dl 12   CHOL/HDL 0 - 5 2   Resulting Agency  [facility]     Lab Results  Component Value Date   CHOL 132 11/11/2020   HDL 65 11/11/2020   LDLCALC 54 11/11/2020   TRIG 65 11/11/2020   CHOLHDL 2.0 11/11/2020   Lab Results  Component Value Date   CREATININE 0.87 11/12/2020   BUN 7 (L) 11/12/2020   NA 128 (L) 11/12/2020   K 3.7 11/12/2020   CL 100 11/12/2020   CO2 22 11/12/2020   CBC Latest Ref Rng & Units 11/12/2020 11/11/2020 11/10/2020  WBC 4.0 - 10.5 K/uL 1.6(L) 1.8(L) 1.7(L)  Hemoglobin 13.0 - 17.0 g/dL 11.3(L) 11.3(L) 12.2(L)  Hematocrit 39.0 - 52.0 % 31.7(L) 30.2(L) 33.7(L)  Platelets 150 - 400 K/uL 120(L) 120(L) 135(L)    No results found for: HGBA1C No results found for: TSH  ==================================================  COVID-19 Education: The signs and symptoms of COVID-19 were discussed with the patient and how to seek care for testing (follow up with PCP or arrange E-visit).    I spent a total of 45 minutes with the patient spent in direct patient consultation.  Additional time spent with chart review  / charting (studies, outside notes, etc): 24 min Total Time: 69 min  Current medicines are reviewed at length with the patient today.  (+/- concerns) n/a  This visit occurred during the SARS-CoV-2 public health emergency.  Safety protocols were in place, including screening questions prior to the visit, additional usage of staff PPE, and extensive cleaning of exam room while observing appropriate contact time as  indicated for disinfecting solutions.  Notice: This dictation was prepared with Dragon dictation along with smart phrase technology. Any transcriptional errors that result from this process are unintentional and may not be corrected upon review.  Studies Ordered:   Orders Placed This Encounter  Procedures   EKG 12-Lead    Patient Instructions / Medication Changes & Studies & Tests Ordered   Patient  Instructions  Medication Instructions:     Do steroid taper take with food   Prednisone 60 mg ( take  6 tablets of 10 mg) daily for 2 days Then 40 mg ( take 4 tablets of 10 mg ) daily for 2 days  Then 20 mg ( take 2 tablets of 10 mg ) daily for 2 days  Then 10 mg ( take 1 tablets of 10 mg ) daily)for 2 days  then stop taking medication   *If you need a refill on your cardiac medications before your next appointment, please call your pharmacy*   Lab Work:  Not needed   Testing/Procedures: Not needed   Follow-Up: At [facility], you and your health needs are our priority.  As part of our continuing mission to provide you with exceptional heart care, we have created designated Provider Care Teams.  These Care Teams include your primary Cardiologist (physician) and Advanced Practice Providers (APPs -  Physician Assistants and Nurse Practitioners) who all work together to provide you with the care you need, when you need it.     Your next appointment:   2 month(s) April   keep appointment  The format for your next appointment:   In Person  Provider:   Glenetta Hew, MD      Glenetta Hew, M.D., M.S. Interventional Cardiologist   Pager # [phone] Phone # [phone] [address] [place], [place] [address]   Thank you for choosing Heartcare at [facility]!!

## 2021-06-24 ENCOUNTER — Telehealth: Payer: Self-pay

## 2021-06-24 LAB — POCT INR: INR: 2.7 (ref 2.0–3.0)

## 2021-06-24 NOTE — Telephone Encounter (Signed)
Reminded pt to check INR. Verbalized understanding °

## 2021-06-25 ENCOUNTER — Ambulatory Visit (INDEPENDENT_AMBULATORY_CARE_PROVIDER_SITE_OTHER): Payer: Medicare HMO | Admitting: Cardiovascular Disease

## 2021-06-25 DIAGNOSIS — I4891 Unspecified atrial fibrillation: Secondary | ICD-10-CM | POA: Diagnosis not present

## 2021-06-25 DIAGNOSIS — Z952 Presence of prosthetic heart valve: Secondary | ICD-10-CM

## 2021-06-25 DIAGNOSIS — Z5181 Encounter for therapeutic drug level monitoring: Secondary | ICD-10-CM | POA: Diagnosis not present

## 2021-06-25 DIAGNOSIS — D6869 Other thrombophilia: Secondary | ICD-10-CM | POA: Diagnosis not present

## 2021-06-26 ENCOUNTER — Encounter: Payer: Self-pay | Admitting: Cardiology

## 2021-06-26 NOTE — Assessment & Plan Note (Signed)
He actually just had fasting lipids checked during his recent hospitalization.  LDL 47 stable if not improved from July 2022.  Tolerating rosuvastatin well.  Excellent lipid control.  Plan: Continue rosuvastatin 20 mg daily

## 2021-06-26 NOTE — Assessment & Plan Note (Signed)
Has been maintaining sinus rhythm for quite a long time now.  Doing well off of amiodarone.  Rate controlled with low-dose Toprol.  He is on warfarin for prosthetic aortic valve.  Plan:  Continue rate control with Toprol 25 mg daily.  Continue warfarin (goal INR 2.5-3.5)  PRN Amiodarone for breakthrough A-fib (he is able identify symptoms, and confirms with [facility]) => PRN Amiodarone Load 400 mg twice daily x5 days (if not converted, continue 200 mg twice daily for 5 days)

## 2021-06-26 NOTE — Assessment & Plan Note (Signed)
At this point, I am convinced that his ongoing chest pain is not cardiac in nature.  This seemingly musculoskeletal in nature.  We will try a steroid taper: Prednisone 60 mg daily X 2 days, 40 mg x 2 days, 20 mg x 2 days then 10 mg x 2 days

## 2021-06-26 NOTE — Assessment & Plan Note (Signed)
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  Maintain INR 2.5-3.5.  No bleeding issues.  In the past we will use enoxaparin for bridging if procedure is needed. => Bridging recommendations assisted by [facility] Pharm D Team.

## 2021-06-26 NOTE — Assessment & Plan Note (Addendum)
Saint Jude prosthetic valve from 2004. He just had an echocardiogram done showing stable valve.  Did not comment on the prosthesis.  But no significant stenosis noted.  Can hold off on follow-up now till 2025.  Discussed SBE prophylaxis.  (Ampicillin ordered) On warfarin with goal INR 2.5-3.5

## 2021-06-26 NOTE — Assessment & Plan Note (Signed)
Recent cath and echo reviewed.  See CAD section.

## 2021-06-26 NOTE — Assessment & Plan Note (Signed)
He has both native and graft disease.  He has had 2 stable Myoview's suggesting minimal peri-infarct ischemia with cardiac catheterization confirming these findings.  No obvious PCI options.  I do not think that this current chest pain episode that he had is at all related to cardiac symptoms.  It does not seem cardiac in nature.  Not exertional.  Worse with lying down.  I suspect this probably musculoskeletal.  He does on occasion have true exertional chest pain but that has been relatively well controlled with a combination of Toprol, amlodipine and Ranexa. 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  Plan:  Continue current dose of amlodipine 10 mg daily, Toprol 25 mg daily and Ranexa 5 mg twice daily.  Continue valsartan and for afterload reduction to 320 mg daily.  Continue rosuvastatin 20 mg daily with excellent lipid management.  Not on aspirin or Plavix because of warfarin.  (We recently stopped aspirin)

## 2021-06-26 NOTE — Assessment & Plan Note (Signed)
Seems euvolemic on exam today.  No PND, orthopnea and trivial edema.  Echo relatively benign.  Plan: Continue current dose of Toprol, valsartan and and amlodipine for blood pressure/afterload reduction.  Has been converted to standing dose of Lasix.  I think we probably will be to go back to PRN once he comes out of this episode.

## 2021-06-26 NOTE — Assessment & Plan Note (Signed)
Blood pressure is well controlled today.  Remains on amlodipine 10 mg and Toprol 25 mg along with valsartan 320 mg.  Changed to daily Lasix.

## 2021-06-30 ENCOUNTER — Ambulatory Visit: Payer: Medicare HMO | Admitting: Cardiology

## 2021-07-08 ENCOUNTER — Ambulatory Visit (INDEPENDENT_AMBULATORY_CARE_PROVIDER_SITE_OTHER): Payer: Medicare HMO | Admitting: Cardiology

## 2021-07-08 DIAGNOSIS — D6869 Other thrombophilia: Secondary | ICD-10-CM | POA: Diagnosis not present

## 2021-07-08 DIAGNOSIS — I4891 Unspecified atrial fibrillation: Secondary | ICD-10-CM

## 2021-07-08 DIAGNOSIS — Z5181 Encounter for therapeutic drug level monitoring: Secondary | ICD-10-CM | POA: Diagnosis not present

## 2021-07-08 DIAGNOSIS — Z952 Presence of prosthetic heart valve: Secondary | ICD-10-CM

## 2021-07-08 LAB — POCT INR: INR: 3 (ref 2.0–3.0)

## 2021-07-13 ENCOUNTER — Telehealth: Payer: Self-pay | Admitting: *Deleted

## 2021-07-13 NOTE — Telephone Encounter (Signed)
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   ? ?Procedure: CT Guided Lung Biopsy ?Date of procedure: 07/22/21 ? ?CrCl 73.35 ml/min ?Platelet count 123 ? ?Per office protocol, patient can hold warfarin for 5 days prior to procedure.   ? ?Patient WILL need bridging with Lovenox (enoxaparin) around procedure. ? ?This will be coordinated by the [facility] coumadin clinic. ? ?

## 2021-07-13 NOTE — Telephone Encounter (Signed)
Pt called back and wanted to know where in the process for the pre op clearance we were at. He states he does know that he is going to need Lovenox bridge. Pt has been made aware that Demaris Callander, from [facility] at the [place] office is going to call him and set up his Lovenox. Pt thanked me for the call and the help. Looks like Pharm-D already sent over notes to [facility]   ?

## 2021-07-13 NOTE — Telephone Encounter (Signed)
See message below. I reached out to the pt for information as to who was doing the procedure. Pt states [facility], but could not remember the MD name. Pt tells me that a fax was sent over to the [facility] office for Dr. Ellyn Hack. I asked the pt if he would ask the requesting office to please fax clearance request to [phone] attn" pre op team here at [facility]. And I will be sure to get clearance in for review. Pt thanked me for the call and he will have clearance faxed to [facility]. Fax # ? ?Message ?Received: Today ?Pavero, Christopher, RPH  P Cv Div Preop; Michae Kava, CMA ?Correction.  I meant "I do not SEE clearance in patient's chart."  I apologize   ?  ?   ?Previous Messages ?  ?----- Message -----  ?From: Rollen Sox, [facility]  ?Sent: 07/13/2021   9:27 AM EST  ?To: Michae Kava, CMA, Cv Div Preop  ? ?Patient called to check on status of clearance for a lung biopsy.  Do not need clearance in patients chart. Procedure will be performed at [facility].  He does not know the name of the surgeon.  Patient has been bridged with Lovenox in the past.  ? ?Pt number [phone]  ?

## 2021-07-13 NOTE — Telephone Encounter (Signed)
? ?  Pre-operative Risk Assessment  ?  ?Patient Name: Erik Meyer  ?DOB: 05-10-1940 ?MRN: [id number] ? ?  ? ?Request for Surgical Clearance ? ? ?Procedure:   CT Guided Lung Biopsy ? ? ?Date of Procedure:  Clearance 07/22/21                              ? ?Surgeon:  Dr Toney Reil (Interventional Radiologist)  ?Surgeon's Group or Practice Name: [facility]  ?Phone number:  [phone] ?Fax number:  [phone] ?  ?Type of Clearance Requested:   ?- Pharmacy:  Hold Warfarin (Coumadin)   ? ?Type of Anesthesia:   Lidocaine ? ?Additional requests/questions:   N/A ? ?Signed, ?Ulice Brilliant T   ?07/13/2021, 12:27 PM  ? ?

## 2021-07-13 NOTE — Telephone Encounter (Signed)
Patient was returning call 

## 2021-07-14 ENCOUNTER — Other Ambulatory Visit: Payer: Self-pay

## 2021-07-14 ENCOUNTER — Telehealth: Payer: Self-pay

## 2021-07-14 MED ORDER — ENOXAPARIN SODIUM 100 MG/ML IJ SOSY: 100.0000 mg | PREFILLED_SYRINGE | Freq: Two times a day (BID) | INTRAMUSCULAR | 1 refills | Status: DC

## 2021-07-14 NOTE — Telephone Encounter (Signed)
I spoke to patient regarding upcoming procedure and Lovenox Bridging.  I sent instructions through My Chart and he verbalized understanding. ?

## 2021-07-15 NOTE — Telephone Encounter (Signed)
Patient was sent instructions for bridging Lovenox on 07/14/21.  ?

## 2021-07-22 HISTORY — PX: OTHER SURGICAL HISTORY: SHX169

## 2021-07-28 ENCOUNTER — Ambulatory Visit (INDEPENDENT_AMBULATORY_CARE_PROVIDER_SITE_OTHER): Payer: Medicare HMO | Admitting: Cardiovascular Disease

## 2021-07-28 DIAGNOSIS — Z952 Presence of prosthetic heart valve: Secondary | ICD-10-CM

## 2021-07-28 DIAGNOSIS — I4891 Unspecified atrial fibrillation: Secondary | ICD-10-CM

## 2021-07-28 DIAGNOSIS — D6869 Other thrombophilia: Secondary | ICD-10-CM

## 2021-07-28 DIAGNOSIS — Z5181 Encounter for therapeutic drug level monitoring: Secondary | ICD-10-CM | POA: Diagnosis not present

## 2021-07-28 LAB — POCT INR: INR: 1.6 — AB (ref 2.0–3.0)

## 2021-07-29 ENCOUNTER — Other Ambulatory Visit: Payer: Self-pay | Admitting: Cardiology

## 2021-08-03 ENCOUNTER — Ambulatory Visit (INDEPENDENT_AMBULATORY_CARE_PROVIDER_SITE_OTHER): Payer: Medicare HMO | Admitting: Pharmacist Clinician (PhC)/ Clinical Pharmacy Specialist

## 2021-08-03 DIAGNOSIS — D6869 Other thrombophilia: Secondary | ICD-10-CM | POA: Diagnosis not present

## 2021-08-03 DIAGNOSIS — Z7901 Long term (current) use of anticoagulants: Secondary | ICD-10-CM

## 2021-08-03 DIAGNOSIS — I480 Paroxysmal atrial fibrillation: Secondary | ICD-10-CM

## 2021-08-03 DIAGNOSIS — Z952 Presence of prosthetic heart valve: Secondary | ICD-10-CM

## 2021-08-03 DIAGNOSIS — I4891 Unspecified atrial fibrillation: Secondary | ICD-10-CM

## 2021-08-03 LAB — POCT INR: INR: 2.3 (ref 2.0–3.0)

## 2021-08-04 ENCOUNTER — Encounter: Payer: Self-pay | Admitting: Cardiology

## 2021-08-04 NOTE — Telephone Encounter (Signed)
We can try another run of Steroid taper. ? ?Glenetta Hew, MD ? ?

## 2021-08-09 ENCOUNTER — Ambulatory Visit (INDEPENDENT_AMBULATORY_CARE_PROVIDER_SITE_OTHER): Payer: Medicare HMO | Admitting: Internal Medicine

## 2021-08-09 DIAGNOSIS — Z952 Presence of prosthetic heart valve: Secondary | ICD-10-CM | POA: Diagnosis not present

## 2021-08-09 DIAGNOSIS — Z5181 Encounter for therapeutic drug level monitoring: Secondary | ICD-10-CM

## 2021-08-09 LAB — POCT INR: INR: 2.5 (ref 2.0–3.0)

## 2021-08-19 NOTE — Telephone Encounter (Signed)
Spoke to patient . Patient states he is not having anymore chest discomfort at present time. ? Patient has an upcoming appointment 08/24/21 ?

## 2021-08-23 ENCOUNTER — Ambulatory Visit (INDEPENDENT_AMBULATORY_CARE_PROVIDER_SITE_OTHER): Payer: Medicare HMO | Admitting: Cardiology

## 2021-08-23 DIAGNOSIS — Z952 Presence of prosthetic heart valve: Secondary | ICD-10-CM

## 2021-08-23 DIAGNOSIS — Z5181 Encounter for therapeutic drug level monitoring: Secondary | ICD-10-CM | POA: Diagnosis not present

## 2021-08-23 LAB — POCT INR: INR: 2.7 (ref 2.0–3.0)

## 2021-08-24 ENCOUNTER — Encounter: Payer: Self-pay | Admitting: Cardiology

## 2021-08-24 ENCOUNTER — Ambulatory Visit: Payer: Medicare HMO | Admitting: Cardiology

## 2021-08-24 VITALS — BP 128/60 | HR 83 | Ht 69.0 in | Wt 196.2 lb

## 2021-08-24 DIAGNOSIS — D6869 Other thrombophilia: Secondary | ICD-10-CM

## 2021-08-24 DIAGNOSIS — E785 Hyperlipidemia, unspecified: Secondary | ICD-10-CM

## 2021-08-24 DIAGNOSIS — I25709 Atherosclerosis of coronary artery bypass graft(s), unspecified, with unspecified angina pectoris: Secondary | ICD-10-CM | POA: Diagnosis not present

## 2021-08-24 DIAGNOSIS — Z951 Presence of aortocoronary bypass graft: Secondary | ICD-10-CM

## 2021-08-24 DIAGNOSIS — I25119 Atherosclerotic heart disease of native coronary artery with unspecified angina pectoris: Secondary | ICD-10-CM

## 2021-08-24 DIAGNOSIS — I480 Paroxysmal atrial fibrillation: Secondary | ICD-10-CM | POA: Diagnosis not present

## 2021-08-24 DIAGNOSIS — R0789 Other chest pain: Secondary | ICD-10-CM

## 2021-08-24 DIAGNOSIS — I5032 Chronic diastolic (congestive) heart failure: Secondary | ICD-10-CM | POA: Diagnosis not present

## 2021-08-24 DIAGNOSIS — I10 Essential (primary) hypertension: Secondary | ICD-10-CM

## 2021-08-24 DIAGNOSIS — E669 Obesity, unspecified: Secondary | ICD-10-CM

## 2021-08-24 DIAGNOSIS — Z952 Presence of prosthetic heart valve: Secondary | ICD-10-CM

## 2021-08-24 DIAGNOSIS — I872 Venous insufficiency (chronic) (peripheral): Secondary | ICD-10-CM

## 2021-08-24 MED ORDER — POTASSIUM CHLORIDE CRYS ER 20 MEQ PO TBCR: 20.0000 meq | EXTENDED_RELEASE_TABLET | Freq: Every day | ORAL | 3 refills | Status: DC

## 2021-08-24 NOTE — Patient Instructions (Addendum)
Medication Instructions:  ? Potassium 20 meq  changes to one tablet daily  ? ? ?*If you need a refill on your cardiac medications before your next appointment, please call your pharmacy* ? ? ?Lab Work: ? ?Not needed ? ? ?Testing/Procedures: ?Not needed ? ? ?Follow-Up: ?At [facility], you and your health needs are our priority.  As part of our continuing mission to provide you with exceptional heart care, we have created designated Provider Care Teams.  These Care Teams include your primary Cardiologist (physician) and Advanced Practice Providers (APPs -  Physician Assistants and Nurse Practitioners) who all work together to provide you with the care you need, when you need it. ? ?  ? ?Your next appointment:   ?7 month(s) Nov 2023 ? ?The format for your next appointment:   ?In Person ? ?Provider:   ?Glenetta Hew, MD  ? ? ? ?

## 2021-08-24 NOTE — Progress Notes (Signed)
Primary Care Provider: Ivan Anchors, MD - in the process of changing to new provider (Dr. Claiborne Billings is retiring)- Dr. Leota Sauers Cardiologist: Glenetta Hew, MD Electrophysiologist: None Pulmonary Medicine: [facility] Oncologist: Dr. Georgiann Cocker [facility])  Clinic Note: Chief Complaint  Patient presents with   Follow-up    Currently not having chest pain issues. Recent biopsy of lung mass-thankfully, not cancerous.   Coronary Artery Disease    Stable; no angina   Cardiac Valve Problem    Status post AVR: Echo stable.   Congestive Heart Failure    Chronic HFpEF.  Not requiring additional Lasix dosing.   Atrial Fibrillation    As far as Erik Meyer can tell, no breakthrough episodes.  No prolonged rapid irregular heartbeats.  No bleeding   ===================================  ASSESSMENT/PLAN   Problem List Items Addressed This Visit       Cardiology Problems   Coronary artery disease involving coronary bypass graft of native heart with angina pectoris (HCC) (Chronic)   Paroxysmal-persistent atrial fibrillation (HCC); CHA2DS2-VASc Score 7 (CHF, HTN, VascDz/CAD, >75 -2, TIA - 2) - Primary (Chronic)    Seems to be maintaining sinus rhythm.  Doing well off of amiodarone.  Baseline rate control with 25 mg Toprol. On warfarin for combination A-fib and AVR (goal INR 2.5-3.5) PRN amiodarone load for breakthrough A-fib. => Erik Meyer usually notes symptoms, and can confirm with [facility] =>.  Amiodarone load: 400 mg twice daily x5 days, if not converted continue to 100 mg p.o. for 5 more days until cardiac. If Erik Meyer does not cardiovert within the first 5 days, would probably consider DCCV      Chronic diastolic heart failure (HCC) (Chronic)    Preserved EF on most recent echocardiogram.  Trivial edema with no PND orthopnea.  Stable regimen as noted in CAD history.  Would not titrate beta-blocker prior simply because of concerns with fatigue.  On max dose valsartan for afterload reduction as well as  amlodipine.  I asked that Erik Meyer take a standing dose of Lasix 20 mg an additional dose as needed for breakthrough edema or dyspnea during the last episode.  Erik Meyer is now back to taking it more on a as needed basis..       Coronary artery disease involving native coronary artery of native heart with angina pectoris (HCC) (Chronic)    Both native and graft disease. Erik Meyer has had 2 stable Myoview's and at least 2 cardiac catheterizations showing stable findings.  Erik Meyer is chest discomfort may or may not be anginal in nature.  I think a lot of what is feeling now is musculoskeletal as it felt better with the steroid taper. Erik Meyer clearly has some microvascular component for which she is on amlodipine and Toprol.  We have also initiated ranolazine and the symptoms more consistent with angina seem to have improved.   Plan: No aspirin or Plavix because of warfarin Rosuvastatin 20 mg daily, excellent control On amlodipine 10 mg and Ranexa 500 mg twice daily On Toprol XL 25 mg daily On valsartan 320 mg for afterload reduction. Standing dose of Lasix along with as needed additional dose for weight gain or edema      Dyslipidemia, goal LDL below 70 (Chronic)    Lipids were just checked in February.  LDL outstanding at 47.  Continue current modest dose of rosuvastatin 20 mg daily.       Hypertension, essential (Chronic)    Well-controlled BP on current dose of amlodipine, Toprol, valsartan and Lasix.  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 Erik Meyer actually had a CT of his chest done back in February at [facility] that showed no significant aortic pathology and back there is no comment on postsurgical changes to the valve or ascending aorta.       S/P CABG x 4 (Chronic)    Stable findings on cardiac catheterization July  2022. 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  Occluded SVG-diagonal patent LIMA-distal LAD.       Hx of aortic valve replacement, mechanical (Chronic)    Echocardiogram performed February of this year at [facility] made no comment on the valve besides mild AI.  Did not comment on ascending aorta either.  Apparently seem to be relatively normal.  On warfarin with target INR 2.5-3.5.  Discussed importance of SBE prophylaxis.  Prescription for ampicillin standing by.       Anterior chest wall pain   Obesity (BMI 30-39.9) (Chronic)    Erik Meyer has made dietary adjustments, has cut down alcohol and is not increased his level of exercise.  Erik Meyer has lost some weight now and is no longer obese.       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  No bleeding issues.  Initially would consider bridging with Lovenox per CVRR pharmacy team.        ===================================  HPI:    Erik Meyer is a 81 y.o. male former smoker with a very complicated PMH of MV-CAD (s/p CABG x4, AVR and aortoplasty-2004), PAF, chronic HFpEF, HTN and COPD reviewed below who presents today for work in visit to discuss recurrent chest pain.  CV History:  2004: MV-CAD-> s/p CABG/AVR/aortoplasty in 7096 => complicated by by PAF (on warfarin for mechanical valve), cardiomyopathy and chronic fatigue  Cardiac Cath 04/11/2017: Native RCA ~patent to PAV/PL, Native LM-LCx(OM1 & OM2), LAD-D1 & D2 then 100% after SP2). Patent LIMA-dLAD, occluded SeqSVG-RPDA-PL & SVG-D2.  September 2020:  Noted fatigue and exercise intolerance =>  GXT with no evidence of chronotropic incompetence. Echo showed EF stable at 45 to 50%.  Weaned off of amiodarone, maintained on Toprol 25-> less fatigue  PLAN: Allow Permissive Hypertension; PRN amiodarone load for breakthrough A-fib Convert atorvastatin to a rosuvastatin  for better lipid management   Myoview 10/28/20: Findings c/w Inferior-Inferolateral & Anterior-anteroapical Infarct w/ peri-infarct Ischemia (INTERMEDIATE RISK - but similar to 2018).  CATH 11/11/2020 for ? Unstable Angina - Stable finding on Cath & Echo. (See image below) => findings correlate with Myoview findings. Echo 06/2021 Jule Ser, [facility]):   EF 60-65%. Mild -GR1-2 DD. Mod LA dilation. Mod RV dilation with Mod-Severely dilated RA & likely elevated RAP/CVP, but normal RVP. Mild AI.   CTA Chest - no PE. LLL Nodule (9 x 17 mm), Pulmonary Emphysema COPD/Emphysema Lung nodule MGUS  SEMIR BRILL was last seen on June 23, 2021 for posthospital follow-up-Erik Meyer had a short stay at [facility] for chest pain - with Hyponatremia.  Echo and CTA of the chest reviewed above.   => Erik Meyer was still noticing discomfort in the lower part of the chest that is better lying down.  Symptoms in the right side sometimes left but usually ends  up in the left lateral side.  Symptoms can be there all day long and is not exertional.  Erik Meyer was back from exercise.  Erik Meyer was somewhat frustrated at the reports given in the hospital and unclear what Erik Meyer was this amiodarone, and they recommend taking Lasix every day.  Erik Meyer did not have any PND orthopnea and significant stable edema.  Thankfully, no breakthrough spells of A-fib. We actually tried a steroid taper with prednisone 60 mg, 40 mg, 20 mg, 10 mg (each dose daily x2 days) Continued amlodipine 10 mg daily and Ranexa 500 mg twice daily along with Toprol 25 mg for anginal benefit.  Recent Hospitalizations: No hospitalizations since last visit.  Seen by [facility] 06/24/2021 in follow-up for lung nodule seen on CT. => I discussed plan of PET/CT versus watch and wait and recheck CT in the short-term.  For COPD, they discussed 6-minute walk test and PFTs. Denied any chest pain.  Noted exertional dyspnea.  Indicates that Erik Meyer exercises at the [facility] 3 days a  week doing water aerobics.  Had lost about 10 pounds-stop taking.  And had a bout of diarrhea. Continued his prednisone taper along with Stiolto.  Oncology f/u 06/25/2021: (MGUS & Lung Nodule). No c/o CP or worse than usual DOE, no cough/hemoptysis. Noted that Erik Meyer quit EtOH => improved WBC& Plt levels - Hgb lower (? Related to hospital blood draws). Agreed with PET CT -> discussed Bx / nodule resection - even if benign Recommended Lasix PRN wgt increase > 2 lb. 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 Did not feel like Erik Meyer was having any CHF symptoms with no edema.  Oncology 07/07/2021: PET CT scan 07/26/2021 confirmed hypermetabolic mass Recommended biopsy; requested holding warfarin and bridging with Lovenox. CT-guided biopsy 0/86/5784 (complicated case with multiple repositioning attempts.  Small amount of rectal bleeding.  No pneumothorax postprocedure. => Follow-up chest x-ray 08/05/2021 no evidence of pneumothorax.  Bilateral pleural thickening.  No pleural effusion.  Linear and interstitial densities in both lung bases.  (Discoid atelectasis)  08/04/21 -> Phone Call -> noted recurrent CP, Ranexa not helping.  Started over the weekend & continued; reduced stamina - tires easily. => Had Lung Bx on 3/16. - No site swelling.  Noted that Prednisone did help some - but wore off quickly. => on recall - denied any further CP.  Appt scheduled for 4/18.   Reviewed  CV studies:    The following studies were reviewed today: (if available, images/films reviewed: From Epic Chart or Care Everywhere) No new studies.:  Interval History:   Erik Meyer returns here today for follow-up stating that Erik Meyer is not really having any more that discomfort to the same extent that Erik Meyer was when Erik Meyer called in.  Erik Meyer just has off-and-on chest pain but not as bad right now.  It seems to come and go in cycles.  Erik Meyer  actually is concerned Erik Meyer may be having some anxiety type attacks.  Erik Meyer says that Erik Meyer has not really had any spells that would suggest Erik Meyer is having A-fib but every now and then Erik Meyer feels his heart rate go up and down for no reason.  Erik Meyer is doing routine exercise at the [facility] about 3 to 4 days a week.  With exercise his heart rate gets into the 100 blood pressure can sometimes go up to the 90s.  Sometimes when Erik Meyer walks  in the car Erik Meyer gets very dizzy and lightheaded.  Usually is following his workout.  Erik Meyer oftentimes has to stop to get there is balance walking to the car for long ways.  Erik Meyer is not noticing any chest pain or pressure while exerting himself while exercising at the [facility].  No claudication.  Erik Meyer denies any prolonged irregular heartbeats palpitations as noted.  No PND, orthopnea with trivial mild edema.  Erik Meyer has not had to take any extra Lasix recently. No coughing or wheezing.  No hemoptysis.  Erik Meyer has had those weird low blood pressure near syncopal episodes but no true syncope.   REVIEWED OF SYSTEMS   Review of Systems  Constitutional:  Positive for malaise/fatigue (Not as much pep in the step, but seems to be doing better recently.) and weight loss (Erik Meyer lost weight in the beginning part of the year, but not much recently.).  HENT:  Negative for congestion and nosebleeds.   Respiratory:  Positive for cough (Minimal coughing.) and shortness of breath. Negative for hemoptysis and wheezing.   Cardiovascular:        Per HPI.  Gastrointestinal:  Negative for abdominal pain, blood in stool, melena and vomiting.  Genitourinary:  Negative for frequency and hematuria.  Musculoskeletal:  Positive for back pain and joint pain. Negative for falls (No falls but Erik Meyer has come close because of hypotension issues.) and myalgias.  Neurological:  Positive for dizziness (Per HPI) and weakness (Generalized weakness.  Just does not feel as strong as Erik Meyer used to feel.).  Endo/Heme/Allergies:  Bruises/bleeds easily (Better  since Erik Meyer has cut down alcohol consumption.).  Psychiatric/Behavioral:  Positive for depression (Probably because Erik Meyer had to be caregiver for his wife who is actually doing worse and Erik Meyer is with multiple falls and illnesses.  Erik Meyer just is not doing much in the way of any pleasurable activities.  Not sleeping well.  More fatigued.  Not eating as well.). Negative for memory loss. The patient is nervous/anxious and has insomnia.    I have reviewed and (if needed) personally updated the patient's problem list, medications, allergies, past medical and surgical history, social and family history.   PAST MEDICAL HISTORY   Past Medical History:  Diagnosis Date   Anemia    CAD, multiple vessel 12/2002   Found during preop cath for [facility] AVR procedure; Myoview 5/'13: No ischemia or infarct, EF 53% --> cath December 2018: Proximal LAD 65% followed by mid LAD 65% then 100% occlusion.  Patent LIMA-LAD.  SVG-D2 occluded.  RPDA occluded. Seq SVG-r PDA-PL patent to PDA, but the PDA is occluded as is the proximal native PDA; Similar Findings 11/2020   COPD (chronic obstructive pulmonary disease) (HCC)    Emphysema noted on CT scan.   Diverticulosis    Dyslipidemia, goal LDL below 70    Monitored by PCP. On statin (last labs scan from September 2015: TC 149, TG 127, HDL 55, LDL 69)   ED (erectile dysfunction)    GERD (gastroesophageal reflux disease)    History of SBO (small bowel obstruction) 07/2013   Abdominal surgery with LOA   Hx of irritable bowel syndrome    Hypertension, essential    Lesion of right native kidney 08/20/2013   Abdominal US : 2.4 cm x 1.7 complex lesion in the upper pole the right kidney in March of 2012 concerning for RCC although enlarging hemorrhagic cyst could have this appearance as well.,    Monoclonal gammopathy of undetermined significance    Paroxysmal-persistent atrial fibrillation (  [place]); CHA2DS2-VASc Score 7 03/23/2017   CHA2DS2-VASc (CHF, HTN, CAD, agex2, TIAx2 = 7)-> Most  recent episode January 3-5, 2022; unsuccessful cardioversion with amiodarone; -> went to ER-synchronized DCCV x1   Psoriasis    S/P AVR (aortic valve replacement) and aortoplasty 12/2002   Dr. Cyndia BentDeneen Harts procedure - St. Jude aVR (25 mm prosthesis) with aortic root conduit;; Echo 10/'14: EF 55-60%, Mod Conc LVH, Gr 1 DD, Well Seated AoV Mech Prosthesis with normal P gradients (no stenosis), Mod-Severe LA dilation --> follow-up echo November 2016: Well functioning mechanical valve. Paradoxical septal motion. EF 50-55%. Severe LA dilation. Moderate RA dilation.    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   LIMA-LAD, SVG-D1, SVG- PDA-PLA (along with AVR); SVG-Diag & Seq SVG-rPDA-PL both CTO (2018)    PAST SURGICAL HISTORY   Past Surgical History:  Procedure Laterality Date   AORTIC VALVE REPLACEMENT  12/26/2002   CG mechanical mechanical AVR as part of Bentall procedure   BENTALL PROCEDURE  12/26/2002   Aortic valve replacement and replacement of aortic root aneurysm using a 25 mm St. JUDE mechanical valve conduit with reimplantation of the coronary arteries   CARDIAC CATHETERIZATION  12/20/2002   diffuse  coronary disease,prior to his BENTALL PROCEDURE   CORONARY ARTERY BYPASS GRAFT  12/26/2002   LIMA to LAD,SVG to diagonal, SVG to PDA and PLA.    CORONARY/GRAFT ANGIOGRAPHY N/A 04/21/2017   Procedure: CORONARY/GRAFT ANGIOGRAPHY;  Surgeon: Leonie Man, MD;  Location: [place] [facility];  Service: Cardiovascular; Native RCA ~patent to PAV/PL, Native LM-LCx(OM1 & OM2), LAD-D1 & D2 then 100% after SP2). Patent LIMA-dLAD, occluded SeqSVG-RPDA-PL & SVG-D2. --. FINDINGS CORELATED WELL WITH PERFUSION DEFECT SEEN ON MYOVIEW - INFERIOR INFARCT & ANTERIOR INFARCT. --Med Rx   CT-Guided Lung Biopsy  07/22/2021   [facility]   EXERCISE TOLERANCE TEST  09/12/2017    5 METS Baseline ST depression.  No new changes with exercise.  Heart rate went to 141 bpm.  Poor exercise tolerance reaching target heart rate at  2:26-minute,.  No chronotropic Incompetence   LAPAROSCOPIC LYSIS OF ADHESIONS  07/2013   performed in [place] for SBO    LEFT HEART CATH AND CORONARY ANGIOGRAPHY N/A 11/11/2020   Procedure: LEFT HEART CATH AND CORONARY ANGIOGRAPHY;  Surgeon: Leonie Man, MD;  Location: [place] [facility];  Service: Cardiovascular;   pLAD 65%, mLAd 65% (stable) -> 100% CTO. Patent LIMA-dLAD. Ost rPDA 100% CTO with patent RPL system.. SVG-D2 100%. SVG-rPDA-PL occluded. Native LCx - ~normal.  STABLE FINDINGS (Still correlate with Myoview)   NM MYOVIEW LTD  10/04/2011   09/2011: showed no ischemia;EF52%; 03/2017 - [facility] [place].  Mild LV dilation.  EF 45 to 50%..  Concern for prior infarct in the inferior lateral wall and apical anterior wall consistent with infarct with peri-infarct ischemia. => On review, these findings correlate with cardiac catheter showing inferior infarct and anterior infarct.   NM MYOVIEW LTD  10/28/2020   Equivocal Findings;   EF ~45-50%. No EKG changes. Medium Size - Severe defect - basal inferolateral & mid inferolateral. Also Medium Size-Severe defect in mid Anterior, apical Anterior & Apex. C/w Prior MI with Peri-Infarct Ischemia. - INTERMEDIATE RISK - STABLE FROM 2018   PARTIAL COLECTOMY  1994   THORACIC AORTIC ANEURYSM REPAIR  2004   Saint Jude aortic valve conduit-Bentall AVR   TRANSTHORACIC ECHOCARDIOGRAM  06/21/2021   ([place]) EF 60 to 65%.  Mild diastolic dysfunction.  Normal LAP.  Moderate LA  dilation.  Moderate RV dilation.  Moderate-severe RA dilation.  Very mild AI.   TRANSTHORACIC ECHOCARDIOGRAM  11/2018    Stable EF 45-50%-moderate HK of apical-anteroseptal wall..  Moderate asymmetric LVH with GR 1 DD.  Normal RV.  RA mildly dilated.  Moderate MAC.   TRANSTHORACIC ECHOCARDIOGRAM  11/11/2020   EF 45 to 50%.  Mildly decreased function.  Inferolateral hypokinesis and small area of anteroseptal wall motion abnormality.  RV is borderline size with  moderate dysfunction-normal CVP.Marland Kitchen  Both atria are moderately dilated.  25 mm bileaflet mechanical aortic valve in place.  Normal function and normal gradients..   VENTRAL HERNIA REPAIR     repair with mesh by Dr Collene Mares at [place] Cath 11/11/2020: pLAD 65%, mLAd 65% (stable) -> 100% CTO. Patent LIMA-dLAD. Ost rPDA 100% CTO with patent RPL system.. SVG-D2 100%. SVG-rPDA-PL occluded. Native LCx - ~normal.  STABLE FINDINGS      There is no immunization history on file for this patient.  MEDICATIONS/ALLERGIES   Current Meds  Medication Sig   amiodarone (PACERONE) 200 MG tablet Take 2 tablets (400 mg total) by mouth 2 (two) times daily as needed (atrial fibrillation).   amLODipine (NORVASC) 10 MG tablet TAKE 1 TABLET EVERY DAY   ampicillin (PRINCIPEN) 500 MG capsule Take 500 mg by mouth every evening.   betamethasone dipropionate (DIPROLENE) 0.05 % ointment Apply 1 application topically 2 (two) times daily as needed (psoriasis).   Cyanocobalamin (RA VITAMIN B12) 2000 MCG TBCR Take 2,000 mcg by mouth in the morning.   DENTA 5000 PLUS 1.1 % CREA dental cream Place 1 application onto teeth at bedtime.   desipramine (NOPRAMIN) 10 MG tablet Take 10 mg by mouth at bedtime.   enoxaparin (LOVENOX) 100 MG/ML injection Inject 1 mL (100 mg total) into the skin every 12 (twelve) hours.   enoxaparin (LOVENOX) 100 MG/ML injection Inject 1 mL (100 mg total) into the skin every 12 (twelve) hours.   esomeprazole (NEXIUM) 20 MG capsule Take 20 mg by mouth in the morning.   ferrous sulfate 325 (65 FE) MG tablet Take 325 mg by mouth every evening.   finasteride (PROSCAR) 5 MG tablet Take 5 mg by mouth daily.   furosemide (LASIX) 20 MG tablet TAKE 1 TO 2 TABLETS EVERY DAY IF NEEDED   LORazepam (ATIVAN) 0.5 MG tablet Take 0.5 mg by mouth in the morning, at noon, and at bedtime.   metoprolol succinate (TOPROL-XL) 25 MG 24 hr tablet TAKE 1 TABLET EVERY DAY   metroNIDAZOLE (METROGEL) 1 % gel Apply 1  application topically daily as needed (roasacea).   nitroGLYCERIN (NITROSTAT) 0.4 MG SL tablet Place 1 tablet (0.4 mg total) under the tongue every 5 (five) minutes as needed for chest pain.   rosuvastatin (CRESTOR) 20 MG tablet TAKE 1 TABLET EVERY DAY   tamsulosin (FLOMAX) 0.4 MG CAPS capsule Take 0.8 mg by mouth at bedtime.   Tiotropium Bromide-Olodaterol (STIOLTO RESPIMAT) 2.5-2.5 MCG/ACT AERS Inhale 2 puffs into the lungs in the morning.   valsartan (DIOVAN) 320 MG tablet TAKE 1 TABLET EVERY DAY   warfarin (COUMADIN) 5 MG tablet TAKE 1 AND 1/2 TABLETS TO 2 TABLETS DAILY AS DIRECTED BY COUMADIN CLINIC   zonisamide (ZONEGRAN) 50 MG capsule Take 100 mg by mouth at bedtime.   [DISCONTINUED] potassium chloride SA (KLOR-CON) 20 MEQ tablet TAKE 1 TABLET TWICE DAILY   [DISCONTINUED] ranolazine (RANEXA) 500 MG 12 hr tablet TAKE 1 TABLET TWICE DAILY  Allergies  Allergen Reactions   Doxycycline Rash    SOCIAL HISTORY/FAMILY HISTORY   Reviewed in Epic:  Pertinent findings:  Social History   Tobacco Use   Smoking status: Former    Types: Cigarettes    Quit date: 06/16/1997    Years since quitting: 24.2   Smokeless tobacco: Never  Vaping Use   Vaping Use: Never used  Substance Use Topics   Alcohol use: Yes   Drug use: No   Social History   Social History Narrative   Married gentleman with no children. Quit smoking in 1998.   Education: 2 years of college. Erik Meyer does drink caffeine.  Erik Meyer drinks social beer.   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 Claiborne Billings in [place]; GI: Dr. Janus Molder in [place]      **November 2020: Erik Meyer notes increased stress family to care for his wife who is showing signs of worsening dementia.  Hard to adjust to this and is somewhat depressing.    -Erik Meyer is try to work on cutting back on his intake of salt  specific    OBJCTIVE -PE, EKG, labs   Wt Readings from Last 3 Encounters:  08/24/21 196 lb 3.2 oz (89 kg)  06/23/21 197 lb 12.8 oz (89.7 kg)  12/07/20 202 lb 6.4 oz (91.8 kg)    Physical Exam: BP 128/60   Pulse 83   Ht _0  (1.753 m)   Wt 196 lb 3.2 oz (89 kg)   SpO2 95%   BMI 28.97 kg/m  Physical Exam Vitals reviewed.  Constitutional:      General: Erik Meyer is not in acute distress.    Appearance: Normal appearance. Erik Meyer is not ill-appearing or toxic-appearing.     Comments: Borderline obese but no longer moderately obese like Erik Meyer used to be.  Well-groomed.  Well-nourished.  Actually appears younger than stated age.  HENT:     Head: Normocephalic and atraumatic.  Neck:     Vascular: No carotid bruit.  Cardiovascular:     Rate and Rhythm: Normal rate and regular rhythm.     Pulses: Normal pulses.     Heart sounds: Murmur (Harsh 2/6 SEM at RUSB-neck.  Cannot exclude soft DM) heard.    No friction rub. No gallop.  Pulmonary:     Effort: Pulmonary effort is normal. No respiratory distress.     Comments: Mildly diminished basal breath sounds but no rales or rhonchi. Chest:     Chest wall: Tenderness (Left lateral rib comfort) present.  Musculoskeletal:        General: Swelling (Trivial) present. Normal range of motion.     Cervical back: Normal range of motion and neck supple.  Skin:    General: Skin is warm and dry.  Neurological:     Mental Status: Erik Meyer is alert. Mental status is at baseline.     Cranial Nerves: No cranial nerve deficit.     Gait: Gait normal.  Psychiatric:        Thought Content: Thought content normal.        Judgment: Judgment normal.     Comments: Seems a little bit brighter today than last visit, but still down.    Adult ECG Report N/a  Recent Labs:    [facility] Related to CBC Component 07/22/21  06/25/21  06/20/21  06/20/21  01/21/21   WBC 2.4 Low  3.5 Low  2.0 Low  2.4 Low  2.0 Low   RBC 3.35 Low  3.65 Low  4.01 Low  4.24 3.37 Low   HGB  11.5 Low  12.0 Low  13.3 Low  14.1 11.5 Low   HCT 32.2 Low  35.2 Low  36.8 Low  39.2 Low  34.0 Low   MCV 96 96 High  92 93 101 High   MCH 34.3 High  32.9 33.2 High  33.3 High  34.1 High   MCHC 35.7 34.1 36.1 High  36.0 33.8  Plt Ct 129 Low  123 Low  108 Low  129 Low  132 Low    07/22/2021 Left Lower Lung Nodule Biopsy: Left lower lobe of lung, biopsy:  Benign lung tissue showing fibrosis and chronic inflammation. Blood  Ref Range & Units 06/21/2021  CHOLESTEROL TOTAL 100 - 199 mg/dL 139   Trig 0 - 149 mg/dL 58   HDL >=39 mg/dL 80   LDL 0 - 99 mg/dL 47   VLDL 5 - 40 mg/dl 12   CHOL/HDL 0 - 5 2   Hgb A1c 4.8-5.6% 5.3  Resulting Agency  [facility]   Lab Results  Component Value Date   CHOL 132 11/11/2020   HDL 65 11/11/2020   LDLCALC 54 11/11/2020   TRIG 65 11/11/2020   CHOLHDL 2.0 11/11/2020   Comprehensive Metabolic Panel Component 25/42/70  06/25/21  06/21/21  06/20/21  01/13/21   Glucose -- 145 High  99 133 High  83  BUN -- _0 Creatinine -- 0.93 0.83 0.94 1.04  eGFR improved from -- 84 90  83  73  BUN/Creatinine Ratio -- 13 -- -- 13  Sodium -- 136 -- -- 133 Low   Potassium -- 4.8 3.6 Low  4.3 4.2  Chloride -- 97 -- -- 95 Low   CO2 -- _1 CALCIUM -- 9.4 -- -- 8.9  Total Protein 5.5 Low  5.7 Low  -- 7.3 6  Albumin, Serum -- 4.1 -- -- 4.1  Globulin, Total 2 Low  1.6 -- -- 1.9  Albumin/Globulin Ratio 1.8 High  2.6 High  -- -- 2.2  Total Bilirubin -- 0.4 -- -- 0.3  Alkaline Phosphatase -- 67 -- -- 73  AST -- 19 -- 14 18  ALT (SGPT) -- 24 -- --     ==================================================  COVID-19 Education: The signs and symptoms of COVID-19 were discussed with the patient and how to seek care for testing (follow up with PCP or arrange E-visit).    I spent a total of 21 minutes with the patient spent in direct patient consultation.  Additional time spent with chart review  / charting (studies, outside notes, etc): 35 min =>  Multiple different clinic visits scar biopsy results and lab results are reviewed including Oncology and pulmonology. Total Time: 56 min  Current medicines are reviewed at length with the patient today.  (+/- concerns) n/a  This visit occurred during the SARS-CoV-2 public health emergency.  Safety protocols were in place, including screening questions prior to the visit, additional usage of staff PPE, and extensive cleaning of exam room while observing appropriate contact time as indicated for disinfecting solutions.  Notice: This dictation was prepared with Dragon dictation along with smart phrase technology. Any transcriptional errors that result from this process are unintentional and may not be corrected upon review.  Studies Ordered:  No orders of the defined types were placed in this encounter.   Patient Instructions / Medication Changes & Studies & Tests Ordered   Patient Instructions  Medication Instructions:   Potassium 20 meq  changes to one tablet daily    *If you need a refill on your cardiac medications before your next appointment, please call your pharmacy*   Lab Work:  Not needed   Testing/Procedures: Not needed   Follow-Up: At [facility], you and your health needs are our priority.  As part of our continuing mission to provide you with exceptional heart care, we have created designated Provider Care Teams.  These Care Teams include your primary Cardiologist (physician) and Advanced Practice Providers (APPs -  Physician Assistants and Nurse Practitioners) who all work together to provide you with the care you need, when you need it.     Your next appointment:   7 month(s) Nov 2023  The format for your next appointment:   In Person  Provider:   Glenetta Hew, MD         Glenetta Hew, M.D., M.S. Interventional Cardiologist   Pager # [phone] Phone # [phone] [address] [place], [place] [address]   Thank you for  choosing Heartcare at [facility]!!

## 2021-09-06 ENCOUNTER — Ambulatory Visit (INDEPENDENT_AMBULATORY_CARE_PROVIDER_SITE_OTHER): Payer: Medicare HMO | Admitting: Cardiology

## 2021-09-06 DIAGNOSIS — Z952 Presence of prosthetic heart valve: Secondary | ICD-10-CM

## 2021-09-06 DIAGNOSIS — Z5181 Encounter for therapeutic drug level monitoring: Secondary | ICD-10-CM | POA: Diagnosis not present

## 2021-09-06 LAB — POCT INR: INR: 3.1 — AB (ref 2.0–3.0)

## 2021-09-20 ENCOUNTER — Other Ambulatory Visit: Payer: Self-pay | Admitting: Cardiology

## 2021-09-22 ENCOUNTER — Ambulatory Visit (INDEPENDENT_AMBULATORY_CARE_PROVIDER_SITE_OTHER): Payer: Medicare HMO | Admitting: Cardiology

## 2021-09-22 DIAGNOSIS — Z5181 Encounter for therapeutic drug level monitoring: Secondary | ICD-10-CM | POA: Diagnosis not present

## 2021-09-22 DIAGNOSIS — Z952 Presence of prosthetic heart valve: Secondary | ICD-10-CM

## 2021-09-22 LAB — POCT INR: INR: 2.9 (ref 2.0–3.0)

## 2021-09-26 ENCOUNTER — Encounter: Payer: Self-pay | Admitting: Cardiology

## 2021-09-26 NOTE — Assessment & Plan Note (Signed)
Seems to be maintaining sinus rhythm.  Doing well off of amiodarone.   Baseline rate control with 25 mg Toprol.  On warfarin for combination A-fib and AVR (goal INR 2.5-3.5)  PRN amiodarone load for breakthrough A-fib. => He usually notes symptoms, and can confirm with [facility] =>.  Amiodarone load: 400 mg twice daily x5 days, if not converted continue to 100 mg p.o. for 5 more days until cardiac.  If he does not cardiovert within the first 5 days, would probably consider DCCV

## 2021-09-26 NOTE — Assessment & Plan Note (Signed)
Lipids were just checked in February.  LDL outstanding at 47.  Continue current modest dose of rosuvastatin 20 mg daily.

## 2021-09-26 NOTE — Assessment & Plan Note (Signed)
Stable findings on cardiac catheterization July 2022. 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  Occluded SVG-diagonal patent LIMA-distal LAD.

## 2021-09-26 NOTE — Assessment & Plan Note (Signed)
Valve is stable on most recent echocardiogram. He actually had a CT of his chest done back in February at [facility] that showed no significant aortic pathology and back there is no comment on postsurgical changes to the valve or ascending aorta.

## 2021-09-26 NOTE — Assessment & Plan Note (Signed)
He has made dietary adjustments, has cut down alcohol and is not increased his level of exercise.  He has lost some weight now and is no longer obese.

## 2021-09-26 NOTE — Assessment & Plan Note (Signed)
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  No bleeding issues.  Initially would consider bridging with Lovenox per [facility] pharmacy team.

## 2021-09-26 NOTE — Assessment & Plan Note (Signed)
Echocardiogram performed February of this year at [facility] made no comment on the valve besides mild AI.  Did not comment on ascending aorta either.  Apparently seem to be relatively normal.  On warfarin with target INR 2.5-3.5.  Discussed importance of SBE prophylaxis.  Prescription for ampicillin standing by.

## 2021-09-26 NOTE — Assessment & Plan Note (Signed)
Stable.  Encouraged foot elevation.  Compression stockings if possible.  Lasix standing and PRN.

## 2021-09-26 NOTE — Assessment & Plan Note (Signed)
Both native and graft disease. He has had 2 stable Myoview's and at least 2 cardiac catheterizations showing stable findings.  He is chest discomfort may or may not be anginal in nature.  I think a lot of what is feeling now is musculoskeletal as it felt better with the steroid taper. He clearly has some microvascular component for which she is on amlodipine and Toprol.  We have also initiated ranolazine and the symptoms more consistent with angina seem to have improved.   Plan:  No aspirin or Plavix because of warfarin  Rosuvastatin 20 mg daily, excellent control  On amlodipine 10 mg and Ranexa 500 mg twice daily  On Toprol XL 25 mg daily  On valsartan 320 mg for afterload reduction.  Standing dose of Lasix along with as needed additional dose for weight gain or edema

## 2021-09-26 NOTE — Assessment & Plan Note (Addendum)
Preserved EF on most recent echocardiogram.  Trivial edema with no PND orthopnea.  Stable regimen as noted in CAD history.  Would not titrate beta-blocker prior simply because of concerns with fatigue.  On max dose valsartan for afterload reduction as well as amlodipine.  I asked that he take a standing dose of Lasix 20 mg an additional dose as needed for breakthrough edema or dyspnea during the last episode.  He is now back to taking it more on a as needed basis.Marland Kitchen

## 2021-09-26 NOTE — Assessment & Plan Note (Signed)
Well-controlled BP on current dose of amlodipine, Toprol, valsartan and Lasix.  I asked him to take his Lasix daily and then additional doses as needed for weight gain > 2-3 pounds

## 2021-10-07 ENCOUNTER — Telehealth: Payer: Self-pay

## 2021-10-07 LAB — POCT INR: INR: 2.4 (ref 2.0–3.0)

## 2021-10-07 NOTE — Telephone Encounter (Signed)
Reminded patient to check INR.  Verbalized understanding 

## 2021-10-08 ENCOUNTER — Ambulatory Visit (INDEPENDENT_AMBULATORY_CARE_PROVIDER_SITE_OTHER): Payer: Medicare HMO | Admitting: Cardiology

## 2021-10-08 DIAGNOSIS — Z5181 Encounter for therapeutic drug level monitoring: Secondary | ICD-10-CM

## 2021-10-08 DIAGNOSIS — Z952 Presence of prosthetic heart valve: Secondary | ICD-10-CM

## 2021-10-21 ENCOUNTER — Ambulatory Visit (INDEPENDENT_AMBULATORY_CARE_PROVIDER_SITE_OTHER): Payer: Medicare HMO

## 2021-10-21 DIAGNOSIS — Z952 Presence of prosthetic heart valve: Secondary | ICD-10-CM

## 2021-10-21 DIAGNOSIS — D6869 Other thrombophilia: Secondary | ICD-10-CM | POA: Diagnosis not present

## 2021-10-21 DIAGNOSIS — I480 Paroxysmal atrial fibrillation: Secondary | ICD-10-CM

## 2021-10-21 LAB — POCT INR: INR: 2.6 (ref 2.0–3.0)

## 2021-10-21 NOTE — Patient Instructions (Signed)
Description   Called spoke with pt, advised to continue on same dosage 7.5 mg daily.  Repeat INR 2 weeks. [facility] [phone]

## 2021-10-30 ENCOUNTER — Other Ambulatory Visit: Payer: Self-pay | Admitting: Cardiology

## 2021-11-04 ENCOUNTER — Ambulatory Visit (INDEPENDENT_AMBULATORY_CARE_PROVIDER_SITE_OTHER): Payer: Medicare HMO

## 2021-11-04 DIAGNOSIS — D6869 Other thrombophilia: Secondary | ICD-10-CM

## 2021-11-04 DIAGNOSIS — I480 Paroxysmal atrial fibrillation: Secondary | ICD-10-CM | POA: Diagnosis not present

## 2021-11-04 DIAGNOSIS — Z952 Presence of prosthetic heart valve: Secondary | ICD-10-CM

## 2021-11-04 LAB — POCT INR: INR: 4.4 — AB (ref 2.0–3.0)

## 2021-11-04 NOTE — Patient Instructions (Signed)
Description   SELF TESTER - Called spoke with pt, advised to HOLD today's dose and then continue taking Warfarin 7.5 mg daily. Repeat INR 1 week. [facility] [phone]

## 2021-11-11 ENCOUNTER — Ambulatory Visit (INDEPENDENT_AMBULATORY_CARE_PROVIDER_SITE_OTHER): Payer: Medicare HMO | Admitting: Cardiology

## 2021-11-11 DIAGNOSIS — Z952 Presence of prosthetic heart valve: Secondary | ICD-10-CM

## 2021-11-11 DIAGNOSIS — Z5181 Encounter for therapeutic drug level monitoring: Secondary | ICD-10-CM

## 2021-11-11 LAB — POCT INR: INR: 4.1 — AB (ref 2.0–3.0)

## 2021-11-12 ENCOUNTER — Encounter: Payer: Self-pay | Admitting: Cardiology

## 2021-11-12 DIAGNOSIS — R5383 Other fatigue: Secondary | ICD-10-CM

## 2021-11-12 DIAGNOSIS — I4589 Other specified conduction disorders: Secondary | ICD-10-CM

## 2021-11-16 NOTE — Telephone Encounter (Signed)
Would be happy to do a prescription for a rollator walker.  [place]

## 2021-11-18 ENCOUNTER — Ambulatory Visit (INDEPENDENT_AMBULATORY_CARE_PROVIDER_SITE_OTHER): Payer: Medicare HMO | Admitting: Cardiology

## 2021-11-18 DIAGNOSIS — Z5181 Encounter for therapeutic drug level monitoring: Secondary | ICD-10-CM

## 2021-11-18 LAB — POCT INR: INR: 3.6 — AB (ref 2.0–3.0)

## 2021-11-18 NOTE — Patient Instructions (Signed)
Description   Called and spoke to pt and instructed him to START taking warfarin 1.5 tablets daily except for 1 tablet on Thursdays. Recheck INR in 1 week. [facility] [phone].

## 2021-11-25 ENCOUNTER — Ambulatory Visit (INDEPENDENT_AMBULATORY_CARE_PROVIDER_SITE_OTHER): Payer: Medicare HMO

## 2021-11-25 DIAGNOSIS — I480 Paroxysmal atrial fibrillation: Secondary | ICD-10-CM

## 2021-11-25 DIAGNOSIS — Z952 Presence of prosthetic heart valve: Secondary | ICD-10-CM | POA: Diagnosis not present

## 2021-11-25 DIAGNOSIS — D6869 Other thrombophilia: Secondary | ICD-10-CM

## 2021-11-25 LAB — POCT INR: INR: 3.5 — AB (ref 2.0–3.0)

## 2021-11-25 NOTE — Patient Instructions (Signed)
Description   Called and spoke to pt and instructed to continue taking warfarin 1.5 tablets daily except for 1 tablet on Thursdays. Recheck INR in 2 weeks. [facility] Clinic [phone].

## 2021-12-09 ENCOUNTER — Ambulatory Visit (INDEPENDENT_AMBULATORY_CARE_PROVIDER_SITE_OTHER): Payer: Medicare HMO | Admitting: Cardiovascular Disease

## 2021-12-09 DIAGNOSIS — Z5181 Encounter for therapeutic drug level monitoring: Secondary | ICD-10-CM

## 2021-12-09 DIAGNOSIS — Z952 Presence of prosthetic heart valve: Secondary | ICD-10-CM

## 2021-12-09 LAB — POCT INR: INR: 4.7 — AB (ref 2.0–3.0)

## 2021-12-15 ENCOUNTER — Telehealth: Payer: Self-pay | Admitting: *Deleted

## 2021-12-15 NOTE — Telephone Encounter (Signed)
Received a voicemail regarding the pt from Garnetta Buddy with Oncology with [facility] stating the pt will have a procedure and need to hold warfarin and do a lovenox bridge. She advised on the voicemail to call [phone] regarding this.   Returned call to [place] with Oncology to give her the number to fax over a clearance form for the procedure with all details and the date to [phone] our pre-procedure fax number. Had to leave the message with the front desk and she will get the message to her.

## 2021-12-15 NOTE — Telephone Encounter (Signed)
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  He has done this in the past.  Will wait for clearance form to come through Pre-op pool

## 2021-12-16 ENCOUNTER — Telehealth: Payer: Self-pay | Admitting: *Deleted

## 2021-12-16 ENCOUNTER — Ambulatory Visit (INDEPENDENT_AMBULATORY_CARE_PROVIDER_SITE_OTHER): Payer: Medicare HMO | Admitting: Cardiology

## 2021-12-16 DIAGNOSIS — Z5181 Encounter for therapeutic drug level monitoring: Secondary | ICD-10-CM

## 2021-12-16 DIAGNOSIS — Z952 Presence of prosthetic heart valve: Secondary | ICD-10-CM | POA: Diagnosis not present

## 2021-12-16 LAB — POCT INR: INR: 3.4 — AB (ref 2.0–3.0)

## 2021-12-16 NOTE — Telephone Encounter (Signed)
TO CLARIFY THE PT WANTS OUR OFFICE (CARDIOLOGY TO HANDLE THE WARFARIN AND LOVENOX)  Left message to call back for tele pre op appt

## 2021-12-16 NOTE — Telephone Encounter (Signed)
Will route to pharm then pt will need virtual visit to review. Last OV 08/2021.

## 2021-12-16 NOTE — Telephone Encounter (Signed)
I s/w the pt and he has been scheduled for a tele pre op appt 12/22/21 @ 10 am. Med rec and consent are done. Pt tells me that he is having a lung Bx not bone marrow. I assured the pt that I will update the notes and inform our pharm-d as well. Pt thanked me for the help today. I did send a secure chat to both [employer], Pharm-d and [employer], pharm-d with update on the procedure.

## 2021-12-16 NOTE — Telephone Encounter (Signed)
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    Procedure: bone marrow biopsy Date of procedure:12/28/21   CHA2DS2-VASc Score = 5   This indicates a 7.2% annual risk of stroke. The patient's score is based upon: CHF History: 1 HTN History: 1 Diabetes History: 0 Stroke History: 0 Vascular Disease History: 1 Age Score: 2 Gender Score: 0    CrCl 91 Platelet count 124  Per office protocol, patient can hold warfarin for 5 days prior to procedure.   Patient will need bridging with Lovenox (enoxaparin) around procedure.  INR followed by our practice, but clearance requests that they [facility]) follow Lovenox bridge.  Will defer to them for prescribing and directing patient.   **This guidance is not considered finalized until pre-operative APP has relayed final recommendations.**

## 2021-12-16 NOTE — Telephone Encounter (Signed)
   Name: Erik Meyer  DOB: Nov 01, 1940  MRN: [id number]  Primary Cardiologist: Glenetta Hew, MD   Preoperative team, please contact this patient and set up a phone call appointment for further preoperative risk assessment. Please obtain consent and complete medication review. Thank you for your help.  I confirm that guidance regarding antiplatelet and oral anticoagulation therapy has been completed and, if necessary, noted below.   Charlie Pitter, PA-C 12/16/2021, 3:23 PM [place]

## 2021-12-16 NOTE — Telephone Encounter (Signed)
   Pre-operative Risk Assessment    Patient Name: Erik Meyer  DOB: Feb 05, 1941 MRN: [id number]      Request for Surgical Clearance    Procedure:   BONE MARROW Bx  Date of Surgery:  Clearance 12/28/21                                 Surgeon:  NOT LISTED Surgeon's Group or Practice Name:  [facility] Phone number:  [phone] Fax number:  [phone]   Type of Clearance Requested:   - Pharmacy:  Hold Warfarin (Coumadin) (PER CLEARANCE NOTES; PT WOULD PREFER TO HAVE HIS WARFARIN/LOVENOX BRIDGE MANAGED BY OUR ANTICOAGULATION TEAM   Type of Anesthesia:  Not Indicated   Additional requests/questions:    Jiles Prows   12/16/2021, 11:31 AM

## 2021-12-17 ENCOUNTER — Encounter: Payer: Self-pay | Admitting: Pharmacist Clinician (PhC)/ Clinical Pharmacy Specialist

## 2021-12-20 ENCOUNTER — Telehealth: Payer: Self-pay

## 2021-12-20 NOTE — Telephone Encounter (Signed)
I spoke to the patient and informed him that I will be MyCharting Lovenox Bridge instructions 8/16, if all goes well with his Cardiology pre op visit.  Verbalized understanding

## 2021-12-21 ENCOUNTER — Other Ambulatory Visit: Payer: Self-pay

## 2021-12-21 ENCOUNTER — Encounter: Payer: Self-pay | Admitting: Cardiology

## 2021-12-21 MED ORDER — ENOXAPARIN SODIUM 100 MG/ML IJ SOSY: 100.0000 mg | PREFILLED_SYRINGE | Freq: Two times a day (BID) | INTRAMUSCULAR | 1 refills | Status: DC

## 2021-12-22 ENCOUNTER — Ambulatory Visit (INDEPENDENT_AMBULATORY_CARE_PROVIDER_SITE_OTHER): Payer: Medicare HMO | Admitting: Cardiology

## 2021-12-22 ENCOUNTER — Ambulatory Visit (INDEPENDENT_AMBULATORY_CARE_PROVIDER_SITE_OTHER): Payer: Medicare HMO | Admitting: Nurse Practitioner

## 2021-12-22 DIAGNOSIS — Z5181 Encounter for therapeutic drug level monitoring: Secondary | ICD-10-CM

## 2021-12-22 DIAGNOSIS — Z01810 Encounter for preprocedural cardiovascular examination: Secondary | ICD-10-CM

## 2021-12-22 DIAGNOSIS — Z952 Presence of prosthetic heart valve: Secondary | ICD-10-CM

## 2021-12-22 LAB — POCT INR: INR: 5.3 — AB (ref 2.0–3.0)

## 2021-12-22 NOTE — Progress Notes (Signed)
Virtual Visit via Telephone Note   Because of Erik Meyer co-morbid illnesses, he is at least at moderate risk for complications without adequate follow up.  This format is felt to be most appropriate for this patient at this time.  The patient did not have access to video technology/had technical difficulties with video requiring transitioning to audio format only (telephone).  All issues noted in this document were discussed and addressed.  No physical exam could be performed with this format.  Please refer to the patient's chart for his consent to telehealth for [facility].  Evaluation Performed:  Preoperative cardiovascular risk assessment _____________   Date:  12/22/2021   Patient ID:  Erik Meyer, DOB 1940-10-13, MRN [id number] Patient Location:  Home Provider location:   Office  Primary Care Provider:  Ivan Anchors, MD Primary Cardiologist:  Glenetta Hew, MD  Chief Complaint / Patient Profile   81 y.o. y/o male with a h/o CAD s/p CABG x4, aortic stenosis, aortic root aneurysm s/p AVR/Bentall procedure, chronic diastolic heart failure, paroxysmal atrial fibrillation, hypertension, hyperlipidemia, pulmonary nodule, anemia, and GERD who is pending lung biopsy on 12/28/2021 with [facility] and presents today for telephonic preoperative cardiovascular risk assessment.  Past Medical History    Past Medical History:  Diagnosis Date   Anemia    CAD, multiple vessel 12/2002   Found during preop cath for [facility] AVR procedure; Myoview 5/'13: No ischemia or infarct, EF 53% --> cath December 2018: Proximal LAD 65% followed by mid LAD 65% then 100% occlusion.  Patent LIMA-LAD.  SVG-D2 occluded.  RPDA occluded. Seq SVG-r PDA-PL patent to PDA, but the PDA is occluded as is the proximal native PDA; Similar Findings 11/2020   COPD (chronic obstructive pulmonary disease) (HCC)    Emphysema noted on CT scan.   Diverticulosis    Dyslipidemia,  goal LDL below 70    Monitored by PCP. On statin (last labs scan from September 2015: TC 149, TG 127, HDL 55, LDL 69)   ED (erectile dysfunction)    GERD (gastroesophageal reflux disease)    History of SBO (small bowel obstruction) 07/2013   Abdominal surgery with LOA   Hx of irritable bowel syndrome    Hypertension, essential    Lesion of right native kidney 08/20/2013   Abdominal US : 2.4 cm x 1.7 complex lesion in the upper pole the right kidney in March of 2012 concerning for RCC although enlarging hemorrhagic cyst could have this appearance as well.,    Monoclonal gammopathy of undetermined significance    Paroxysmal-persistent atrial fibrillation ([place]); CHA2DS2-VASc Score 7 03/23/2017   CHA2DS2-VASc (CHF, HTN, CAD, agex2, TIAx2 = 7)-> Most recent episode January 3-5, 2022; unsuccessful cardioversion with amiodarone; -> went to ER-synchronized DCCV x1   Psoriasis    S/P AVR (aortic valve replacement) and aortoplasty 12/2002   Dr. Cyndia BentDeneen Harts procedure - St. Jude aVR (25 mm prosthesis) with aortic root conduit;; Echo 10/'14: EF 55-60%, Mod Conc LVH, Gr 1 DD, Well Seated AoV Mech Prosthesis with normal P gradients (no stenosis), Mod-Severe LA dilation --> follow-up echo November 2016: Well functioning mechanical valve. Paradoxical septal motion. EF 50-55%. Severe LA dilation. Moderate RA dilation.    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   LIMA-LAD, SVG-D1, SVG- PDA-PLA (along with AVR); SVG-Diag & Seq SVG-rPDA-PL both CTO (2018)   Past Surgical History:  Procedure Laterality Date   AORTIC VALVE REPLACEMENT  12/26/2002   CG mechanical mechanical AVR as part  of Bentall procedure   BENTALL PROCEDURE  12/26/2002   Aortic valve replacement and replacement of aortic root aneurysm using a 25 mm St. JUDE mechanical valve conduit with reimplantation of the coronary arteries   CARDIAC CATHETERIZATION  12/20/2002   diffuse  coronary disease,prior to his BENTALL PROCEDURE   CORONARY ARTERY BYPASS GRAFT   12/26/2002   LIMA to LAD,SVG to diagonal, SVG to PDA and PLA.    CORONARY/GRAFT ANGIOGRAPHY N/A 04/21/2017   Procedure: CORONARY/GRAFT ANGIOGRAPHY;  Surgeon: Leonie Man, MD;  Location: [place] [facility];  Service: Cardiovascular; Native RCA ~patent to PAV/PL, Native LM-LCx(OM1 & OM2), LAD-D1 & D2 then 100% after SP2). Patent LIMA-dLAD, occluded SeqSVG-RPDA-PL & SVG-D2. --. FINDINGS CORELATED WELL WITH PERFUSION DEFECT SEEN ON MYOVIEW - INFERIOR INFARCT & ANTERIOR INFARCT. --Med Rx   CT-Guided Lung Biopsy  07/22/2021   [facility]   EXERCISE TOLERANCE TEST  09/12/2017    5 METS Baseline ST depression.  No new changes with exercise.  Heart rate went to 141 bpm.  Poor exercise tolerance reaching target heart rate at 2:26-minute,.  No chronotropic Incompetence   LAPAROSCOPIC LYSIS OF ADHESIONS  07/2013   performed in [place] for SBO    LEFT HEART CATH AND CORONARY ANGIOGRAPHY N/A 11/11/2020   Procedure: LEFT HEART CATH AND CORONARY ANGIOGRAPHY;  Surgeon: Leonie Man, MD;  Location: [place] [facility];  Service: Cardiovascular;   pLAD 65%, mLAd 65% (stable) -> 100% CTO. Patent LIMA-dLAD. Ost rPDA 100% CTO with patent RPL system.. SVG-D2 100%. SVG-rPDA-PL occluded. Native LCx - ~normal.  STABLE FINDINGS (Still correlate with Myoview)   NM MYOVIEW LTD  10/04/2011   09/2011: showed no ischemia;EF52%; 03/2017 - [facility]) [place].  Mild LV dilation.  EF 45 to 50%..  Concern for prior infarct in the inferior lateral wall and apical anterior wall consistent with infarct with peri-infarct ischemia. => On review, these findings correlate with cardiac catheter showing inferior infarct and anterior infarct.   NM MYOVIEW LTD  10/28/2020   Equivocal Findings;   EF ~45-50%. No EKG changes. Medium Size - Severe defect - basal inferolateral & mid inferolateral. Also Medium Size-Severe defect in mid Anterior, apical Anterior & Apex. C/w Prior MI with Peri-Infarct Ischemia. -  INTERMEDIATE RISK - STABLE FROM 2018   PARTIAL COLECTOMY  1994   THORACIC AORTIC ANEURYSM REPAIR  2004   Saint Jude aortic valve conduit-Bentall AVR   TRANSTHORACIC ECHOCARDIOGRAM  06/21/2021   ([place]) EF 60 to 65%.  Mild diastolic dysfunction.  Normal LAP.  Moderate LA dilation.  Moderate RV dilation.  Moderate-severe RA dilation.  Very mild AI.   TRANSTHORACIC ECHOCARDIOGRAM  11/2018    Stable EF 45-50%-moderate HK of apical-anteroseptal wall..  Moderate asymmetric LVH with GR 1 DD.  Normal RV.  RA mildly dilated.  Moderate MAC.   TRANSTHORACIC ECHOCARDIOGRAM  11/11/2020   EF 45 to 50%.  Mildly decreased function.  Inferolateral hypokinesis and small area of anteroseptal wall motion abnormality.  RV is borderline size with moderate dysfunction-normal CVP.Marland Kitchen  Both atria are moderately dilated.  25 mm bileaflet mechanical aortic valve in place.  Normal function and normal gradients..   VENTRAL HERNIA REPAIR     repair with mesh by Dr Collene Mares at [place]  Allergen Reactions   Doxycycline Rash    History of Present Illness    Erik Meyer is a 81 y.o. male who presents via audio/video conferencing for a telehealth  visit today.  Pt was last seen in cardiology clinic on 08/24/2021 by Dr. Ellyn Hack.  At that time Erik Meyer was doing well.  The patient is now pending procedure as outlined above. Since his last visit, he has done well from a cardiac standpoint. He denies chest pain, palpitations, dyspnea, pnd, orthopnea, n, v, dizziness, syncope, edema, weight gain, or early satiety. All other systems reviewed and are otherwise negative except as noted above.   Home Medications    Prior to Admission medications   Medication Sig Start Date End Date Taking? Authorizing Provider  amiodarone (PACERONE) 200 MG tablet Take 2 tablets (400 mg total) by mouth 2 (two) times daily as needed (atrial fibrillation). 11/12/20   Cheryln Manly, NP  amLODipine (NORVASC)  10 MG tablet TAKE 1 TABLET EVERY DAY 03/04/21   Leonie Man, MD  ampicillin (PRINCIPEN) 500 MG capsule Take 500 mg by mouth every evening. 09/04/13   [provider]  betamethasone dipropionate (DIPROLENE) 0.05 % ointment Apply 1 application topically 2 (two) times daily as needed (psoriasis). 06/03/20   [provider]  Colchicine (MITIGARE) 0.6 MG CAPS Take 0.6 mg by mouth daily. Patient not taking: Reported on 08/24/2021 11/12/20   Reino Bellis B, NP  Cyanocobalamin (RA VITAMIN B12) 2000 MCG TBCR Take 2,000 mcg by mouth in the morning.    [provider]  DENTA 5000 PLUS 1.1 % CREA dental cream Place 1 application onto teeth at bedtime. 06/19/14   [provider]  desipramine (NOPRAMIN) 10 MG tablet Take 10 mg by mouth at bedtime. 04/13/21   [provider]  enoxaparin (LOVENOX) 100 MG/ML injection Inject 1 mL (100 mg total) into the skin every 12 (twelve) hours. 12/30/20   Leonie Man, MD  enoxaparin (LOVENOX) 100 MG/ML injection Inject 1 mL (100 mg total) into the skin every 12 (twelve) hours. 07/14/21   Leonie Man, MD  enoxaparin (LOVENOX) 100 MG/ML injection Inject 1 mL (100 mg total) into the skin every 12 (twelve) hours. 12/21/21   Leonie Man, MD  esomeprazole (NEXIUM) 20 MG capsule Take 20 mg by mouth in the morning.    [provider]  ferrous sulfate 325 (65 FE) MG tablet Take 325 mg by mouth every evening.    [provider]  finasteride (PROSCAR) 5 MG tablet Take 5 mg by mouth daily. 04/19/21   [provider]  furosemide (LASIX) 20 MG tablet TAKE 1 TO 2 TABLETS EVERY DAY IF NEEDED 11/01/21   Leonie Man, MD  LORazepam (ATIVAN) 0.5 MG tablet Take 0.5 mg by mouth in the morning, at noon, and at bedtime.    [provider]  metoprolol succinate (TOPROL-XL) 25 MG 24 hr tablet TAKE 1 TABLET EVERY DAY 03/04/21   Leonie Man, MD  metroNIDAZOLE (METROGEL) 1 % gel Apply 1 application  topically daily as needed (roasacea). 05/21/20   [provider]  nitroGLYCERIN (NITROSTAT) 0.4 MG SL tablet Place 1 tablet (0.4 mg total) under the tongue every 5 (five) minutes as needed for chest pain. 07/01/20   Leonie Man, MD  potassium chloride SA (KLOR-CON M) 20 MEQ tablet Take 1 tablet (20 mEq total) by mouth daily. 08/24/21   Leonie Man, MD  ranolazine (RANEXA) 500 MG 12 hr tablet TAKE 1 TABLET TWICE DAILY 09/20/21   Leonie Man, MD  rosuvastatin (CRESTOR) 20 MG tablet TAKE 1 TABLET EVERY DAY 07/29/21   Leonie Man, MD  tamsulosin (FLOMAX) 0.4 MG CAPS capsule Take 0.8 mg by mouth at bedtime.    [provider]  Tiotropium Bromide-Olodaterol (STIOLTO RESPIMAT) 2.5-2.5 MCG/ACT AERS Inhale 2 puffs into the lungs in the morning.    [provider]  valsartan (DIOVAN) 320 MG tablet TAKE 1 TABLET EVERY DAY 06/07/21   Leonie Man, MD  warfarin (COUMADIN) 5 MG tablet TAKE 1 AND 1/2 TABLETS TO 2 TABLETS DAILY AS DIRECTED BY COUMADIN CLINIC 04/22/21   Leonie Man, MD  zonisamide (ZONEGRAN) 50 MG capsule Take 100 mg by mouth at bedtime. 10/14/20   [provider]    Physical Exam    Vital Signs:  EKG ([facility]) shows NSR, 71 bpm. BP: 141/72, SpO2: 97% on RA.   Given telephonic nature of communication, physical exam is limited. AAOx3. NAD. Normal affect.  Speech and respirations are unlabored.  Accessory Clinical Findings    None  Assessment & Plan    1.  Preoperative Cardiovascular Risk Assessment:  According to the Revised Cardiac Risk Index (RCRI), his Perioperative Risk of Major Cardiac Event is (%): 6.6. His Functional Capacity in METs is: 6.02 according to the [place] Activity Status Index (DASI). Therefore, based on ACC/AHA guidelines, patient would be at acceptable risk for the planned procedure without further cardiovascular testing.   Patient with diagnosis of atrial fibrillation and mechanical AVR on warfarin for  anticoagulation.     Procedure: bone marrow biopsy Date of procedure:12/28/21     CHA2DS2-VASc Score = 5   This indicates a 7.2% annual risk of stroke. The patient's score is based upon: CHF History: 1 HTN History: 1 Diabetes History: 0 Stroke History: 0 Vascular Disease History: 1 Age Score: 2 Gender Score: 0     CrCl 91 Platelet count 124   Per office protocol, patient can hold warfarin for 5 days prior to procedure. Patient will need bridging with Lovenox (enoxaparin) around procedure.  This will be coordinated by our [place] office.    A copy of this note will be routed to requesting surgeon.  Time:   Today, I have spent 7 minutes with the patient with telehealth technology discussing medical history, symptoms, and management plan.     Lenna Sciara, NP  12/22/2021, 10:18 AM

## 2022-01-03 ENCOUNTER — Ambulatory Visit (INDEPENDENT_AMBULATORY_CARE_PROVIDER_SITE_OTHER): Payer: Medicare HMO | Admitting: Cardiology

## 2022-01-03 DIAGNOSIS — Z952 Presence of prosthetic heart valve: Secondary | ICD-10-CM

## 2022-01-03 DIAGNOSIS — Z5181 Encounter for therapeutic drug level monitoring: Secondary | ICD-10-CM | POA: Diagnosis not present

## 2022-01-03 LAB — POCT INR: INR: 2.4 (ref 2.0–3.0)

## 2022-01-17 ENCOUNTER — Telehealth: Payer: Self-pay

## 2022-01-17 ENCOUNTER — Ambulatory Visit (INDEPENDENT_AMBULATORY_CARE_PROVIDER_SITE_OTHER): Payer: Medicare HMO | Admitting: Internal Medicine

## 2022-01-17 DIAGNOSIS — Z5181 Encounter for therapeutic drug level monitoring: Secondary | ICD-10-CM | POA: Diagnosis not present

## 2022-01-17 DIAGNOSIS — Z952 Presence of prosthetic heart valve: Secondary | ICD-10-CM

## 2022-01-17 LAB — POCT INR: INR: 4.1 — AB (ref 2.0–3.0)

## 2022-01-17 NOTE — Telephone Encounter (Signed)
Reminded patient to check INR.  Will do on 9/12

## 2022-01-31 ENCOUNTER — Ambulatory Visit (INDEPENDENT_AMBULATORY_CARE_PROVIDER_SITE_OTHER): Payer: Medicare HMO

## 2022-01-31 DIAGNOSIS — D6869 Other thrombophilia: Secondary | ICD-10-CM | POA: Diagnosis not present

## 2022-01-31 DIAGNOSIS — Z952 Presence of prosthetic heart valve: Secondary | ICD-10-CM | POA: Diagnosis not present

## 2022-01-31 DIAGNOSIS — I480 Paroxysmal atrial fibrillation: Secondary | ICD-10-CM

## 2022-01-31 LAB — POCT INR: INR: 4.7 — AB (ref 2.0–3.0)

## 2022-01-31 NOTE — Patient Instructions (Signed)
Description   Called and spoke to pt, HOLD today's dose and then START taking warfarin 1.5 tablets daily except for 1 tablet on Tuesdays and Thursdays. Recheck INR in 1 week. Coumadin Clinic [phone].

## 2022-02-02 ENCOUNTER — Encounter: Payer: Self-pay | Admitting: Cardiology

## 2022-02-03 MED ORDER — POTASSIUM CHLORIDE CRYS ER 20 MEQ PO TBCR: 20.0000 meq | EXTENDED_RELEASE_TABLET | Freq: Every day | ORAL | 3 refills | Status: DC

## 2022-02-06 NOTE — Telephone Encounter (Signed)
Can we have these labs scanned into the lab section. Can list as reviewed.  Glenetta Hew, MD

## 2022-02-06 NOTE — Telephone Encounter (Signed)
Cholesterol levels reviewed. Labs look great.  Cholesterol 165 and LDL 40.  HDL is up to 114-up from 57.  This means that your good cholesterol has gone the right direction.  Triglycerides are down to 50 from 129.  All in all this is a great change.  A1c looks great at 4.8, down from 5.4.  Thyroid levels are normal.  Chemistry panel is all (good.) ->  Stable kidney function.  Potassium looks good.  Sodium is better.  Protein level is better.  Blood counts are stable.  Everything was good.  Glenetta Hew, MD

## 2022-02-07 ENCOUNTER — Ambulatory Visit (INDEPENDENT_AMBULATORY_CARE_PROVIDER_SITE_OTHER): Payer: Medicare HMO

## 2022-02-07 DIAGNOSIS — D6869 Other thrombophilia: Secondary | ICD-10-CM

## 2022-02-07 DIAGNOSIS — I480 Paroxysmal atrial fibrillation: Secondary | ICD-10-CM

## 2022-02-07 DIAGNOSIS — Z952 Presence of prosthetic heart valve: Secondary | ICD-10-CM

## 2022-02-07 LAB — POCT INR: INR: 4.7 — AB (ref 2.0–3.0)

## 2022-02-07 MED ORDER — POTASSIUM CHLORIDE CRYS ER 20 MEQ PO TBCR: 20.0000 meq | EXTENDED_RELEASE_TABLET | Freq: Every day | ORAL | 3 refills | Status: DC

## 2022-02-07 NOTE — Telephone Encounter (Addendum)
Labs printed and sent for scanning.

## 2022-02-07 NOTE — Patient Instructions (Signed)
Description   Called and spoke to pt, HOLD today's dose and eat greens. Then START taking warfarin 1.5 tablets daily except for 1 tablet on Tuesdays, Thursdays, and Saturdays.  Recheck INR in 1 week. Coumadin Clinic [phone].

## 2022-02-14 ENCOUNTER — Ambulatory Visit (INDEPENDENT_AMBULATORY_CARE_PROVIDER_SITE_OTHER): Payer: Medicare HMO | Admitting: Internal Medicine

## 2022-02-14 DIAGNOSIS — Z5181 Encounter for therapeutic drug level monitoring: Secondary | ICD-10-CM

## 2022-02-14 LAB — POCT INR: INR: 2.9 (ref 2.0–3.0)

## 2022-02-21 ENCOUNTER — Ambulatory Visit (INDEPENDENT_AMBULATORY_CARE_PROVIDER_SITE_OTHER): Payer: Medicare HMO | Admitting: Cardiology

## 2022-02-21 DIAGNOSIS — Z5181 Encounter for therapeutic drug level monitoring: Secondary | ICD-10-CM

## 2022-02-21 LAB — POCT INR: INR: 3.8 — AB (ref 2.0–3.0)

## 2022-02-23 ENCOUNTER — Other Ambulatory Visit: Payer: Self-pay | Admitting: Cardiology

## 2022-02-23 DIAGNOSIS — Z5181 Encounter for therapeutic drug level monitoring: Secondary | ICD-10-CM

## 2022-02-24 NOTE — Telephone Encounter (Signed)
Prescription refill request received for warfarin Lov: 12/22/21 ([facility])  Next INR check: 02/28/22 Warfarin tablet strength: '5mg'$   Appropriate dose and refill sent to requested pharmacy.

## 2022-02-28 ENCOUNTER — Ambulatory Visit (INDEPENDENT_AMBULATORY_CARE_PROVIDER_SITE_OTHER): Payer: Medicare HMO

## 2022-02-28 DIAGNOSIS — Z952 Presence of prosthetic heart valve: Secondary | ICD-10-CM

## 2022-02-28 DIAGNOSIS — Z5181 Encounter for therapeutic drug level monitoring: Secondary | ICD-10-CM | POA: Diagnosis not present

## 2022-02-28 LAB — POCT INR: INR: 2.4 (ref 2.0–3.0)

## 2022-02-28 NOTE — Patient Instructions (Signed)
Description   Called and spoke to pt. Instructed to take 1.5 tablets today and then take 1 tablet on Tuesday, Wednesday, and Thursday. Recheck INR on Friday since you are taking Prednisone.  Normal dose: warfarin 1.5 tablets daily except for 1 tablet on Tuesdays, Thursdays, and Saturdays.  Recheck INR on Friday. Coumadin Clinic [phone].

## 2022-03-04 ENCOUNTER — Ambulatory Visit (INDEPENDENT_AMBULATORY_CARE_PROVIDER_SITE_OTHER): Payer: Medicare HMO | Admitting: *Deleted

## 2022-03-04 DIAGNOSIS — Z952 Presence of prosthetic heart valve: Secondary | ICD-10-CM | POA: Diagnosis not present

## 2022-03-04 DIAGNOSIS — D6869 Other thrombophilia: Secondary | ICD-10-CM

## 2022-03-04 DIAGNOSIS — I480 Paroxysmal atrial fibrillation: Secondary | ICD-10-CM

## 2022-03-04 LAB — POCT INR: INR: 3.2 — AB (ref 2.0–3.0)

## 2022-03-04 NOTE — Patient Instructions (Addendum)
Description   SELF TESTER; Called and spoke to pt and instructed to continue taking warfarin 1.5 tablets daily except for 1 tablet on Tuesdays, Thursdays, and Saturdays. Recheck INR in 2 weeks. Coumadin Clinic [phone].

## 2022-03-18 ENCOUNTER — Ambulatory Visit (INDEPENDENT_AMBULATORY_CARE_PROVIDER_SITE_OTHER): Payer: Medicare HMO

## 2022-03-18 DIAGNOSIS — Z5181 Encounter for therapeutic drug level monitoring: Secondary | ICD-10-CM

## 2022-03-18 LAB — POCT INR: INR: 3.7 — AB (ref 2.0–3.0)

## 2022-03-30 ENCOUNTER — Ambulatory Visit (INDEPENDENT_AMBULATORY_CARE_PROVIDER_SITE_OTHER): Payer: Medicare HMO

## 2022-03-30 DIAGNOSIS — I480 Paroxysmal atrial fibrillation: Secondary | ICD-10-CM | POA: Diagnosis not present

## 2022-03-30 DIAGNOSIS — Z952 Presence of prosthetic heart valve: Secondary | ICD-10-CM | POA: Diagnosis not present

## 2022-03-30 DIAGNOSIS — D6869 Other thrombophilia: Secondary | ICD-10-CM | POA: Diagnosis not present

## 2022-03-30 LAB — POCT INR: INR: 5 — AB (ref 2.0–3.0)

## 2022-03-30 NOTE — Patient Instructions (Signed)
Description   SELF TESTER; Called and spoke to pt. Instructed to HOLD today and tomorrow and then ONLY 1 TABLET on Friday and Saturday. Then, resume taking warfarin 1.5 tablets daily except for 1 tablet on Tuesdays, Thursdays, and Saturdays.  Recheck INR on Tuesday.  Coumadin Clinic [phone].

## 2022-04-01 ENCOUNTER — Other Ambulatory Visit: Payer: Self-pay | Admitting: Cardiology

## 2022-04-04 ENCOUNTER — Encounter: Payer: Self-pay | Admitting: Cardiology

## 2022-04-04 MED ORDER — AMLODIPINE BESYLATE 10 MG PO TABS: 10.0000 mg | ORAL_TABLET | Freq: Every day | ORAL | 0 refills | Status: DC

## 2022-04-05 ENCOUNTER — Ambulatory Visit (INDEPENDENT_AMBULATORY_CARE_PROVIDER_SITE_OTHER): Payer: Medicare HMO | Admitting: Cardiology

## 2022-04-05 DIAGNOSIS — Z952 Presence of prosthetic heart valve: Secondary | ICD-10-CM

## 2022-04-05 DIAGNOSIS — Z5181 Encounter for therapeutic drug level monitoring: Secondary | ICD-10-CM

## 2022-04-05 LAB — POCT INR: INR: 2.3 (ref 2.0–3.0)

## 2022-04-11 ENCOUNTER — Ambulatory Visit: Payer: Medicare HMO | Admitting: Nurse Practitioner

## 2022-04-13 NOTE — Progress Notes (Deleted)
Cardiology Office Note:    Date:  04/13/2022   ID:  Erik Meyer, DOB 04/23/1941, MRN [id number]  PCP:  Ivan Anchors, MD   [place] Providers Cardiologist:  Glenetta Hew, MD { Click to update primary MD,subspecialty MD or APP then REFRESH:1}    Referring MD: Ivan Anchors, MD   No chief complaint on file. ***  History of Present Illness:    Erik Meyer is a 81 y.o. 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  Patent LIMA-LAD.  SVG-D2 occluded.  RPDA occluded. Seq SVG-r PDA-PL patent to PDA, but the PDA is occluded as is the proximal native PDA; Similar Findings 11/2020   COPD (chronic obstructive pulmonary disease) (HCC)    Emphysema noted on CT scan.   Diverticulosis    Dyslipidemia, goal LDL below 70    Monitored by PCP. On statin (last labs scan from September 2015: TC 149, TG 127, HDL 55, LDL 69)   ED (erectile dysfunction)    GERD (gastroesophageal reflux disease)    History of SBO (small bowel obstruction) 07/2013   Abdominal surgery with LOA   Hx of irritable bowel syndrome    Hypertension, essential    Lesion of right native kidney 08/20/2013   Abdominal US : 2.4 cm x 1.7 complex lesion in the upper pole the right kidney in March of 2012 concerning for RCC although enlarging hemorrhagic cyst could have this appearance as well.,    Monoclonal gammopathy of undetermined significance    Paroxysmal-persistent atrial fibrillation ([place]); CHA2DS2-VASc Score 7 03/23/2017   CHA2DS2-VASc (CHF, HTN, CAD, agex2, TIAx2 = 7)-> Most recent episode January 3-5, 2022; unsuccessful cardioversion with amiodarone; -> went to ER-synchronized DCCV x1   Psoriasis    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 Cyndia BentDeneen Harts procedure - St. Jude aVR (25 mm prosthesis) with aortic root conduit;; Echo 10/'14: EF 55-60%, Mod Conc LVH, Gr 1 DD, Well Seated AoV Mech Prosthesis with normal P gradients (no stenosis), Mod-Severe LA dilation --> follow-up echo November 2016: Well functioning mechanical valve. Paradoxical septal motion. EF 50-55%. Severe LA dilation. Moderate RA dilation.    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 JUDE mechanical valve conduit with reimplantation of the coronary arteries   CARDIAC CATHETERIZATION  12/20/2002   diffuse  coronary disease,prior to his BENTALL PROCEDURE   CORONARY ARTERY BYPASS GRAFT  12/26/2002   LIMA to LAD,SVG to diagonal, SVG to PDA and PLA.    CORONARY/GRAFT ANGIOGRAPHY N/A 04/21/2017   Procedure: CORONARY/GRAFT ANGIOGRAPHY;  Surgeon: Leonie Man, MD;  Location: [place] [facility];  Service: Cardiovascular; Native RCA ~patent to PAV/PL, Native LM-LCx(OM1 & OM2), LAD-D1 & D2 then 100% after SP2). Patent LIMA-dLAD, occluded SeqSVG-RPDA-PL & SVG-D2. --. FINDINGS CORELATED WELL WITH PERFUSION DEFECT SEEN ON MYOVIEW -  INFERIOR INFARCT & ANTERIOR INFARCT. --Med Rx   CT-Guided Lung Biopsy  07/22/2021   [facility]   EXERCISE TOLERANCE TEST  09/12/2017    5 METS Baseline ST depression.  No new changes with exercise.  Heart rate went to 141 bpm.  Poor exercise tolerance reaching target heart rate at 2:26-minute,.  No chronotropic Incompetence   LAPAROSCOPIC LYSIS OF ADHESIONS  07/2013   performed in [place] for SBO    LEFT HEART CATH AND CORONARY ANGIOGRAPHY N/A 11/11/2020   Procedure: LEFT  HEART CATH AND CORONARY ANGIOGRAPHY;  Surgeon: Leonie Man, MD;  Location: [place] [facility];  Service: Cardiovascular;   pLAD 65%, mLAd 65% (stable) -> 100% CTO. Patent LIMA-dLAD. Ost rPDA 100% CTO with patent RPL system.. SVG-D2 100%. SVG-rPDA-PL occluded. Native LCx - ~normal.  STABLE FINDINGS (Still correlate with Myoview)   NM MYOVIEW LTD  10/04/2011   09/2011: showed no ischemia;EF52%; 03/2017 - [facility]) [place].  Mild LV dilation.  EF 45 to 50%..  Concern for prior infarct in the inferior lateral wall and apical anterior wall consistent with infarct with peri-infarct ischemia. => On review, these findings correlate with cardiac catheter showing inferior infarct and anterior infarct.   NM MYOVIEW LTD  10/28/2020   Equivocal Findings;   EF ~45-50%. No EKG changes. Medium Size - Severe defect - basal inferolateral & mid inferolateral. Also Medium Size-Severe defect in mid Anterior, apical Anterior & Apex. C/w Prior MI with Peri-Infarct Ischemia. - INTERMEDIATE RISK - STABLE FROM 2018   PARTIAL COLECTOMY  1994   THORACIC AORTIC ANEURYSM REPAIR  2004   Saint Jude aortic valve conduit-Bentall AVR   TRANSTHORACIC ECHOCARDIOGRAM  06/21/2021   ([place]) EF 60 to 65%.  Mild diastolic dysfunction.  Normal LAP.  Moderate LA dilation.  Moderate RV dilation.  Moderate-severe RA dilation.  Very mild AI.   TRANSTHORACIC ECHOCARDIOGRAM  11/2018    Stable EF 45-50%-moderate HK of apical-anteroseptal wall..  Moderate asymmetric LVH with GR 1 DD.  Normal RV.  RA mildly dilated.  Moderate MAC.   TRANSTHORACIC ECHOCARDIOGRAM  11/11/2020   EF 45 to 50%.  Mildly decreased function.  Inferolateral hypokinesis and small area of anteroseptal wall motion abnormality.  RV is borderline size with moderate dysfunction-normal CVP.Marland Kitchen  Both atria are moderately dilated.  25 mm bileaflet mechanical aortic valve in place.  Normal function and normal gradients..   VENTRAL HERNIA REPAIR     repair with  mesh by Dr Collene Mares at [facility]    Current Medications: No outpatient medications have been marked as taking for the 04/20/22 encounter (Appointment) with Ledora Bottcher, [place].     Allergies:   Doxycycline   Social History   Socioeconomic History   Marital status: Married    Spouse name: Not on file   Number of children: 0   Years of education: Not on file   Highest education level: Not on file  Occupational History    Comment: Retired  Tobacco Use   Smoking status: Former    Types: Cigarettes    Quit date: 06/16/1997    Years since quitting: 24.8   Smokeless tobacco: Never  Vaping Use   Vaping Use: Never used  Substance and Sexual Activity   Alcohol use: Yes   Drug use: No   Sexual activity: Not on file  Other Topics Concern   Not on file  Social History Narrative   Married gentleman with no children. Quit smoking in 1998.  Education: 2 years of college. He does drink caffeine.  He drinks social beer.   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 Claiborne Billings in [place]; GI: Dr. Janus Molder in [place]      **November 2020: He notes increased stress family to care for his wife who is showing signs of worsening dementia.  Hard to adjust to this and is somewhat depressing.    -He is try to work on cutting back on his intake of salt specific   Social Determinants of Health   Financial Resource Strain: Not on file  Food Insecurity: Not on file  Transportation Needs: Not on file  Physical Activity: Not on file  Stress: Not on file  Social Connections: Not on file     Family History: The patient's ***family history includes COPD in his sister; Colon cancer in his mother; Congestive Heart Failure in his mother; Lung cancer in his father.  ROS:   Please see the history of present illness.    *** All other systems  reviewed and are negative.  EKGs/Labs/Other Studies Reviewed:    The following studies were reviewed today: ***  EKG:  EKG is *** ordered today.  The ekg ordered today demonstrates ***  Recent Labs: No results found for requested labs within last 365 days.  Recent Lipid Panel    Component Value Date/Time   CHOL 132 11/11/2020 0319   CHOL 152 09/19/2019 0940   TRIG 65 11/11/2020 0319   HDL 65 11/11/2020 0319   HDL 76 09/19/2019 0940   CHOLHDL 2.0 11/11/2020 0319   VLDL 13 11/11/2020 0319   LDLCALC 54 11/11/2020 0319   LDLCALC 61 09/19/2019 0940     Risk Assessment/Calculations:   {Does this patient have ATRIAL FIBRILLATION?[phone]}  No BP recorded.  {Refresh Note OR Click here to enter BP  :1}***         Physical Exam:    VS:  There were no vitals taken for this visit.    Wt Readings from Last 3 Encounters:  08/24/21 196 lb 3.2 oz (89 kg)  06/23/21 197 lb 12.8 oz (89.7 kg)  12/07/20 202 lb 6.4 oz (91.8 kg)     GEN: *** Well nourished, well developed in no acute distress HEENT: Normal NECK: No JVD; No carotid bruits LYMPHATICS: No lymphadenopathy CARDIAC: ***RRR, no murmurs, rubs, gallops RESPIRATORY:  Clear to auscultation without rales, wheezing or rhonchi  ABDOMEN: Soft, non-tender, non-distended MUSCULOSKELETAL:  No edema; No deformity  SKIN: Warm and dry NEUROLOGIC:  Alert and oriented x 3 PSYCHIATRIC:  Normal affect   ASSESSMENT:    No diagnosis found. PLAN:    In order of problems listed above:  ***      {Are you ordering a CV Procedure (e.g. stress test, cath, DCCV, TEE, etc)?   Press F2        :[id number]}    Medication Adjustments/Labs and Tests Ordered: Current medicines are reviewed at length with the patient today.  Concerns regarding medicines are outlined above.  No orders of the defined types were placed in this encounter.  No orders of the defined types were placed in this encounter.   There are no Patient Instructions on  file for this visit.   Signed, Ledora Bottcher, [place]  04/13/2022 9:47 AM    [facility]

## 2022-04-14 ENCOUNTER — Ambulatory Visit: Payer: Medicare HMO | Admitting: Student

## 2022-04-16 NOTE — Progress Notes (Unsigned)
Cardiology Clinic Note   Patient Name: Erik Meyer Date of Encounter: 04/16/2022  Primary Care Provider:  Ivan Anchors, MD Primary Cardiologist:  Glenetta Hew, MD  Patient Profile    Erik Meyer is a 81 y.o. male with a past medical history of CAD s/p CABG x 4, PAF on anticoagulation, chronic diastolic heart failure, mechanical aortic valve, hyperlipidemia, hypertension who presents to the clinic today for 7 month follow-up of chronic cardiac conditions.   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  Patent LIMA-LAD.  SVG-D2 occluded.  RPDA occluded. Seq SVG-r PDA-PL patent to PDA, but the PDA is occluded as is the proximal native PDA; Similar Findings 11/2020   COPD (chronic obstructive pulmonary disease) (HCC)    Emphysema noted on CT scan.   Diverticulosis    Dyslipidemia, goal LDL below 70    Monitored by PCP. On statin (last labs scan from September 2015: TC 149, TG 127, HDL 55, LDL 69)   ED (erectile dysfunction)    GERD (gastroesophageal reflux disease)    History of SBO (small bowel obstruction) 07/2013   Abdominal surgery with LOA   Hx of irritable bowel syndrome    Hypertension, essential    Lesion of right native kidney 08/20/2013   Abdominal US : 2.4 cm x 1.7 complex lesion in the upper pole the right kidney in March of 2012 concerning for RCC although enlarging hemorrhagic cyst could have this appearance as well.,    Monoclonal gammopathy of undetermined significance    Paroxysmal-persistent atrial fibrillation ([place]); CHA2DS2-VASc Score 7 03/23/2017   CHA2DS2-VASc (CHF, HTN, CAD, agex2, TIAx2 = 7)-> Most recent episode January 3-5, 2022; unsuccessful cardioversion with amiodarone; -> went to ER-synchronized DCCV  x1   Psoriasis    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 Cyndia BentDeneen Harts procedure - St. Jude aVR (25 mm prosthesis) with aortic root conduit;; Echo 10/'14: EF 55-60%, Mod Conc LVH, Gr 1 DD, Well Seated AoV Mech Prosthesis with normal P gradients (no stenosis), Mod-Severe LA dilation --> follow-up echo November 2016: Well functioning mechanical valve. Paradoxical septal motion. EF 50-55%. Severe LA dilation. Moderate RA dilation.    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 JUDE mechanical valve conduit with reimplantation of the coronary arteries   CARDIAC CATHETERIZATION  12/20/2002   diffuse  coronary disease,prior to his BENTALL PROCEDURE   CORONARY ARTERY BYPASS GRAFT  12/26/2002   LIMA to LAD,SVG to diagonal, SVG to PDA and PLA.    CORONARY/GRAFT ANGIOGRAPHY N/A 04/21/2017   Procedure: CORONARY/GRAFT ANGIOGRAPHY;  Surgeon: Leonie Man, MD;  Location: [place] [facility];  Service: Cardiovascular; Native RCA ~patent to PAV/PL, Native LM-LCx(OM1 & OM2), LAD-D1 & D2 then 100% after SP2). Patent LIMA-dLAD, occluded SeqSVG-RPDA-PL & SVG-D2. --. FINDINGS CORELATED WELL WITH PERFUSION DEFECT SEEN ON MYOVIEW -  INFERIOR INFARCT & ANTERIOR INFARCT. --Med Rx   CT-Guided Lung Biopsy  07/22/2021   [facility]   EXERCISE TOLERANCE TEST  09/12/2017    5 METS Baseline ST depression.  No new changes with exercise.  Heart rate went to 141 bpm.  Poor exercise tolerance reaching target heart rate at 2:26-minute,.  No chronotropic Incompetence   LAPAROSCOPIC LYSIS OF ADHESIONS  07/2013   performed in [place] for SBO    LEFT HEART  CATH AND CORONARY ANGIOGRAPHY N/A 11/11/2020   Procedure: LEFT HEART CATH AND CORONARY ANGIOGRAPHY;  Surgeon: Leonie Man, MD;  Location: [place] [facility];  Service: Cardiovascular;   pLAD 65%, mLAd 65% (stable) -> 100% CTO. Patent LIMA-dLAD. Ost rPDA 100% CTO with patent RPL system.. SVG-D2 100%. SVG-rPDA-PL occluded. Native LCx - ~normal.  STABLE FINDINGS (Still correlate with Myoview)   NM MYOVIEW LTD  10/04/2011   09/2011: showed no ischemia;EF52%; 03/2017 - [facility]) [place].  Mild LV dilation.  EF 45 to 50%..  Concern for prior infarct in the inferior lateral wall and apical anterior wall consistent with infarct with peri-infarct ischemia. => On review, these findings correlate with cardiac catheter showing inferior infarct and anterior infarct.   NM MYOVIEW LTD  10/28/2020   Equivocal Findings;   EF ~45-50%. No EKG changes. Medium Size - Severe defect - basal inferolateral & mid inferolateral. Also Medium Size-Severe defect in mid Anterior, apical Anterior & Apex. C/w Prior MI with Peri-Infarct Ischemia. - INTERMEDIATE RISK - STABLE FROM 2018   PARTIAL COLECTOMY  1994   THORACIC AORTIC ANEURYSM REPAIR  2004   Saint Jude aortic valve conduit-Bentall AVR   TRANSTHORACIC ECHOCARDIOGRAM  06/21/2021   ([place]) EF 60 to 65%.  Mild diastolic dysfunction.  Normal LAP.  Moderate LA dilation.  Moderate RV dilation.  Moderate-severe RA dilation.  Very mild AI.   TRANSTHORACIC ECHOCARDIOGRAM  11/2018    Stable EF 45-50%-moderate HK of apical-anteroseptal wall..  Moderate asymmetric LVH with GR 1 DD.  Normal RV.  RA mildly dilated.  Moderate MAC.   TRANSTHORACIC ECHOCARDIOGRAM  11/11/2020   EF 45 to 50%.  Mildly decreased function.  Inferolateral hypokinesis and small area of anteroseptal wall motion abnormality.  RV is borderline size with moderate dysfunction-normal CVP.Marland Kitchen  Both atria are moderately dilated.  25 mm bileaflet mechanical aortic valve in place.  Normal function  and normal gradients..   VENTRAL HERNIA REPAIR     repair with mesh by Dr Collene Mares at [place]  Allergen Reactions   Doxycycline Rash    History of Present Illness    Erik Meyer has a past medical history of: CAD.  CABG x 4 12/26/2002: LIMA to LAD, sequential SVG to RPDA and PL, SVG to diag.  LHC 04/21/2017: Severe native CAD involving mostly the LAD. Grafted PDA no occluded along with sequential SVG PDA/PL. SVG to diag 1 occluded.  LHC 11/11/2020: Severe single vessel CAD with extensive mid to distal LAD disease and distal occlusion at LIMA to LAD graft insertion with patent LIMA to LAD.  PAF. 14 day monitor 12/15/2020: Predominate rhythm is sinus with first degree AV block. Min HR 55, Max 139, avg 74. One 6 beat run VT at 126 bpm. 859 bursts of PAT/SVT at 171 bpm, longest 4 minutes. Rare isolated PACs and PVCs. Short spells of ventricular bigeminy and trigeminy. No obvious afib.  Monitors at home with [employer] mobile.  PRN amiodarone load for breakthrough  afib.  Aortic valve replacement. 12/26/2002: St. Jude mechanical valve.   Chronic diastolic heart failure.  Echo 11/11/2020: EF 45-50%. Mildly decreased left ventricular function. Regional wall motion abnormalities. Mildly dilated left ventricular internal cavity. Moderately reduce RV systolic function. Moderately enlarged right ventricle. Moderately dilated right and left atria. Moderate mitral annular calcification. Normal function and structure of aortic valve prosthesis.   Hypertension.  Hyperlipidemia.  Lipid panel 01/25/2022: LDL 40, HDL 114, TG 50, total 165.   Erik Meyer is a long time patient of cardiology. He became a patient of Dr. Ellyn Hack in May 2015. Patient was last seen in the office by Dr. Ellyn Hack on 08/24/2021. At that time patient was doing well. Recommendation to take Lasix daily with prn dosing for increased weight, shortness of breath or lower extremity edema.   Today, patient ***  CBC? Last  time amio?   CAD. S/p CABG x 11 December 2002. LHC July 2022 showed severe single vessel CAD with extensive mid to distal LAD disease and distal occlusion of LIMA to LAD graft insertion with patent LIMA to LAD. Occluded SVG to RPDA and PL and SVG to diag. Patient denies *** Continue coumadin, metoprolol, rosuvastatin, and Ranexa.  PAF. 14 day monitor in August 2022 showed 1 6 beat run of VT, 859 bursts of PAT/SVT, rare PACs and PVCs and no obvious afib. Patient monitors rhythm at home with [facility] mobile. He reports *** Continue coumadin, metoprolol, valsartan, and amiodarone load as previously instructed (400 mg twice a day x 5 days, if not converted 100 mg x 5 more days) for breakthrough.  Aortic valve replacement. St Jude mechanical valve implantation August 2004. Echo July 2022 showed normal function and structure of mechanical valve. Continue coumadin. *** Chronic diastolic heart failure. Echo July 2022 EF 45-50%. Patient denies *** Continue metoprolol, valsartan, and lasix.  Hypertension. BP *** today. Patient denies headaches or dizziness. Continue metoprolol, valsartan, and amlodipine. *** Hyperlipidemia. LDL 01/25/2022 40, at goal. Continue rosuvastatin.     Home Medications    No outpatient medications have been marked as taking for the 04/20/22 encounter (Appointment) with Ledora Bottcher, [place].    Family History    Family History  Problem Relation Age of Onset   Colon cancer Mother    Congestive Heart Failure Mother        Cardiomyopathy   Lung cancer Father        Was a chronic smoker   COPD Sister        End-stage COPD   He indicated that his mother is deceased. He indicated that his father is deceased. He indicated that his sister is deceased. He indicated that his brother is alive. He indicated that his maternal grandmother is deceased. He indicated that his maternal grandfather is deceased. He indicated that his paternal grandmother is deceased. He indicated that his  paternal grandfather is deceased.   Social History    Social History   Socioeconomic History   Marital status: Married    Spouse name: Not on file   Number of children: 0   Years of education: Not on file   Highest education level: Not on file  Occupational History    Comment: Retired  Tobacco Use   Smoking status: Former    Types: Cigarettes    Quit date: 06/16/1997    Years since quitting: 24.8   Smokeless tobacco: Never  Vaping Use   Vaping Use: Never used  Substance and Sexual Activity   Alcohol use: Yes  Drug use: No   Sexual activity: Not on file  Other Topics Concern   Not on file  Social History Narrative   Married gentleman with no children. Quit smoking in 1998.   Education: 2 years of college. He does drink caffeine.  He drinks social beer.   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 Claiborne Billings in [place]; GI: Dr. Janus Molder in [place]      **November 2020: He notes increased stress family to care for his wife who is showing signs of worsening dementia.  Hard to adjust to this and is somewhat depressing.    -He is try to work on cutting back on his intake of salt specific   Social Determinants of Health   Financial Resource Strain: Not on file  Food Insecurity: Not on file  Transportation Needs: Not on file  Physical Activity: Not on file  Stress: Not on file  Social Connections: Not on file  Intimate Partner Violence: Not on file     Review of Systems    General: *** No chills, fever, night sweats or weight changes.  Cardiovascular:  No chest pain, dyspnea on exertion, edema, orthopnea, palpitations, paroxysmal nocturnal dyspnea. Dermatological: No rash, lesions/masses Respiratory: No cough, dyspnea Urologic: No hematuria, dysuria Abdominal:   No nausea, vomiting, diarrhea, bright red blood  per rectum, melena, or hematemesis Neurologic:  No visual changes, weakness, changes in mental status. All other systems reviewed and are otherwise negative except as noted above.  Physical Exam    VS:  There were no vitals taken for this visit. , BMI There is no height or weight on file to calculate BMI. GEN: *** Well nourished, well developed, in no acute distress. HEENT: Normal. Neck: Supple, no JVD, carotid bruits, or masses. Cardiac: RRR, no murmurs, rubs, or gallops. No clubbing, cyanosis, edema.  Radials/DP/PT 2+ and equal bilaterally.  Respiratory:  Respirations regular and unlabored, clear to auscultation bilaterally. GI: Soft, nontender, nondistended. MS: No deformity or atrophy. Skin: Warm and dry, no rash. Neuro: Strength and sensation are intact. Psych: Normal affect.  Accessory Clinical Findings    The following studies were reviewed for this visit: [place] 11/11/2020:  Prox LAD lesion is 65% stenosed. Mid LAD-1 lesion is 65% stenosed. Stable in appearance from previous cath Mid LAD-2 lesion is 100% stenosed -> at LIMA graft insertion site with better flow Ost RPDA lesion is 100% stenosed. ---GRAFTS--- LIMA graft was visualized by angiography and is moderate in size with no notable disease.. There is competitive flow at the insertion site. Seq SVG- rPDA-PL graft was not visualized due to known occlusion. SVG-DIAG graft was visualized by angiography and is 100% stenosed @ the origin. ------- ---> Normal functioning Prosthetic Bileaflet Aortic Valve  Recent Labs: No results found for requested labs within last 365 days.   Recent Lipid Panel    Component Value Date/Time   CHOL 132 11/11/2020 0319   CHOL 152 09/19/2019 0940   TRIG 65 11/11/2020 0319   HDL 65 11/11/2020 0319   HDL 76 09/19/2019 0940   CHOLHDL 2.0 11/11/2020 0319   VLDL 13 11/11/2020 0319   LDLCALC 54 11/11/2020 0319   LDLCALC 61 09/19/2019 0940    No BP recorded.  {Refresh Note OR Click here to  enter BP  :1}***    ECG  personally reviewed by me today: ***  No significant changes from ***  {Does this patient have ATRIAL FIBRILLATION?:[id number]}   Assessment & Plan   ***      Disposition: ***   Justice [place]. Shemicka Cohrs, NP-C     04/16/2022, 6:00 PM [place] [address] Office [phone] Fax [phone]   I spent *** minutes examining this patient, reviewing medications, and using patient centered shared decision making involving her cardiac care.  Prior to her visit I spent greater than 20 minutes reviewing her past medical history,  medications, and prior cardiac tests.

## 2022-04-19 ENCOUNTER — Ambulatory Visit (INDEPENDENT_AMBULATORY_CARE_PROVIDER_SITE_OTHER): Payer: Medicare HMO | Admitting: *Deleted

## 2022-04-19 DIAGNOSIS — Z952 Presence of prosthetic heart valve: Secondary | ICD-10-CM

## 2022-04-19 DIAGNOSIS — D6869 Other thrombophilia: Secondary | ICD-10-CM

## 2022-04-19 DIAGNOSIS — Z5181 Encounter for therapeutic drug level monitoring: Secondary | ICD-10-CM

## 2022-04-19 DIAGNOSIS — I480 Paroxysmal atrial fibrillation: Secondary | ICD-10-CM

## 2022-04-19 LAB — POCT INR: INR: 3.1 — AB (ref 2.0–3.0)

## 2022-04-19 NOTE — Patient Instructions (Signed)
Description   SELF TESTER; Spoke with pt and instructed to continue taking warfarin 1.5 tablets daily except for 1 tablet on Tuesdays, Thursdays, and Saturdays.  Recheck INR in 2 weeks  [facility] [phone].

## 2022-04-20 ENCOUNTER — Encounter: Payer: Self-pay | Admitting: Physician Assistant

## 2022-04-20 ENCOUNTER — Ambulatory Visit: Payer: Medicare HMO | Attending: Nurse Practitioner | Admitting: Student

## 2022-04-20 VITALS — BP 134/62 | HR 71 | Ht 70.0 in | Wt 191.0 lb

## 2022-04-20 DIAGNOSIS — I480 Paroxysmal atrial fibrillation: Secondary | ICD-10-CM | POA: Diagnosis not present

## 2022-04-20 DIAGNOSIS — E785 Hyperlipidemia, unspecified: Secondary | ICD-10-CM

## 2022-04-20 DIAGNOSIS — I2510 Atherosclerotic heart disease of native coronary artery without angina pectoris: Secondary | ICD-10-CM | POA: Diagnosis not present

## 2022-04-20 DIAGNOSIS — D649 Anemia, unspecified: Secondary | ICD-10-CM

## 2022-04-20 DIAGNOSIS — Z952 Presence of prosthetic heart valve: Secondary | ICD-10-CM | POA: Diagnosis not present

## 2022-04-20 MED ORDER — AMIODARONE HCL 200 MG PO TABS: 400.0000 mg | ORAL_TABLET | Freq: Two times a day (BID) | ORAL | 0 refills | Status: DC | PRN

## 2022-04-20 NOTE — Patient Instructions (Signed)
Medication Instructions:  No Changes *If you need a refill on your cardiac medications before your next appointment, please call your pharmacy*   Lab Work: BMET, CBC. Today If you have labs (blood work) drawn today and your tests are completely normal, you will receive your results only by: [place] (if you have MyChart) OR A paper copy in the mail If you have any lab test that is abnormal or we need to change your treatment, we will call you to review the results.   Testing/Procedures: No Testing   Follow-Up: At [facility], you and your health needs are our priority.  As part of our continuing mission to provide you with exceptional heart care, we have created designated Provider Care Teams.  These Care Teams include your primary Cardiologist (physician) and Advanced Practice Providers (APPs -  Physician Assistants and Nurse Practitioners) who all work together to provide you with the care you need, when you need it.  We recommend signing up for the patient portal called "MyChart".  Sign up information is provided on this After Visit Summary.  MyChart is used to connect with patients for Virtual Visits (Telemedicine).  Patients are able to view lab/test results, encounter notes, upcoming appointments, etc.  Non-urgent messages can be sent to your provider as well.   To learn more about what you can do with MyChart, go to [email or web address].    Your next appointment:   6 month(s)  The format for your next appointment:   In Person  Provider:   Glenetta Hew, MD

## 2022-04-21 LAB — CBC
Hematocrit: 32.6 % — ABNORMAL LOW (ref 37.5–51.0)
Hemoglobin: 11.3 g/dL — ABNORMAL LOW (ref 13.0–17.7)
MCH: 35.6 pg — ABNORMAL HIGH (ref 26.6–33.0)
MCHC: 34.7 g/dL (ref 31.5–35.7)
MCV: 103 fL — ABNORMAL HIGH (ref 79–97)
Platelets: 143 10*3/uL — ABNORMAL LOW (ref 150–450)
RBC: 3.17 x10E6/uL — ABNORMAL LOW (ref 4.14–5.80)
RDW: 14.7 % (ref 11.6–15.4)
WBC: 1.5 10*3/uL — CL (ref 3.4–10.8)

## 2022-04-21 LAB — BASIC METABOLIC PANEL
BUN/Creatinine Ratio: 14 (ref 10–24)
BUN: 10 mg/dL (ref 8–27)
CO2: 26 mmol/L (ref 20–29)
Calcium: 8.9 mg/dL (ref 8.6–10.2)
Chloride: 95 mmol/L — ABNORMAL LOW (ref 96–106)
Creatinine, Ser: 0.69 mg/dL — ABNORMAL LOW (ref 0.76–1.27)
Glucose: 84 mg/dL (ref 70–99)
Potassium: 4.4 mmol/L (ref 3.5–5.2)
Sodium: 131 mmol/L — ABNORMAL LOW (ref 134–144)
eGFR: 94 mL/min/{1.73_m2} (ref >59)

## 2022-04-25 ENCOUNTER — Telehealth: Payer: Self-pay

## 2022-04-25 MED ORDER — AMIODARONE HCL 200 MG PO TABS: 400.0000 mg | ORAL_TABLET | Freq: Two times a day (BID) | ORAL | 0 refills | Status: DC | PRN

## 2022-04-25 NOTE — Telephone Encounter (Addendum)
Called patient regarding results patient had understanding of results----- Message from Mayra Reel, NP sent at 04/21/2022  5:20 PM EST ----- Please let patient know his labs are stable. His WBC is low at 1.5. It has been low for the last several years. Please forward these labs to his PCP.  Thank you!

## 2022-04-25 NOTE — Telephone Encounter (Signed)
RX already sent in today.

## 2022-04-26 MED ORDER — AMIODARONE HCL 200 MG PO TABS: ORAL_TABLET | ORAL | 6 refills | Status: DC

## 2022-05-03 ENCOUNTER — Ambulatory Visit (INDEPENDENT_AMBULATORY_CARE_PROVIDER_SITE_OTHER): Payer: Medicare HMO | Admitting: Cardiovascular Disease

## 2022-05-03 DIAGNOSIS — Z952 Presence of prosthetic heart valve: Secondary | ICD-10-CM | POA: Diagnosis not present

## 2022-05-03 DIAGNOSIS — Z5181 Encounter for therapeutic drug level monitoring: Secondary | ICD-10-CM | POA: Diagnosis not present

## 2022-05-03 LAB — POCT INR: INR: 3.3 — AB (ref 2.0–3.0)

## 2022-05-05 ENCOUNTER — Other Ambulatory Visit: Payer: Self-pay | Admitting: Cardiology

## 2022-05-11 ENCOUNTER — Encounter: Payer: Self-pay | Admitting: Cardiology

## 2022-05-12 NOTE — Telephone Encounter (Signed)
Thank you for the update.  Look forward to hearing what the next plans are.  Glenetta Hew, MD

## 2022-05-16 ENCOUNTER — Ambulatory Visit (INDEPENDENT_AMBULATORY_CARE_PROVIDER_SITE_OTHER): Payer: Medicare HMO | Admitting: *Deleted

## 2022-05-16 DIAGNOSIS — Z5181 Encounter for therapeutic drug level monitoring: Secondary | ICD-10-CM | POA: Diagnosis not present

## 2022-05-16 DIAGNOSIS — Z952 Presence of prosthetic heart valve: Secondary | ICD-10-CM

## 2022-05-16 DIAGNOSIS — I480 Paroxysmal atrial fibrillation: Secondary | ICD-10-CM | POA: Diagnosis not present

## 2022-05-16 DIAGNOSIS — D6869 Other thrombophilia: Secondary | ICD-10-CM | POA: Diagnosis not present

## 2022-05-16 LAB — POCT INR: INR: 3.9 — AB (ref 2.0–3.0)

## 2022-05-30 ENCOUNTER — Encounter (HOSPITAL_BASED_OUTPATIENT_CLINIC_OR_DEPARTMENT_OTHER): Payer: Self-pay | Admitting: Pharmacist Clinician (PhC)/ Clinical Pharmacy Specialist

## 2022-05-30 ENCOUNTER — Encounter: Payer: Self-pay | Admitting: Cardiology

## 2022-05-30 ENCOUNTER — Ambulatory Visit (INDEPENDENT_AMBULATORY_CARE_PROVIDER_SITE_OTHER): Payer: Medicare HMO | Admitting: *Deleted

## 2022-05-30 DIAGNOSIS — Z952 Presence of prosthetic heart valve: Secondary | ICD-10-CM | POA: Diagnosis not present

## 2022-05-30 DIAGNOSIS — D6869 Other thrombophilia: Secondary | ICD-10-CM

## 2022-05-30 DIAGNOSIS — I480 Paroxysmal atrial fibrillation: Secondary | ICD-10-CM | POA: Diagnosis not present

## 2022-05-30 LAB — POCT INR: INR: 2.4 (ref 2.0–3.0)

## 2022-05-30 NOTE — Patient Instructions (Signed)
Description   SELF TESTER; Spoke with pt and he states he has a procedure pending on Friday, he will follow instructions per [facility] Chest MD and Cyril Mourning our Pharm D-note on 1/22 '@119pm'$ . Pt will continue holding warfarin for upcoming EBUS on 06/03/22 then resume warfarin normal dose when advised to by [facility] Chest Physicians.  Normal dose: warfarin 1.5 tablets daily except for 1 tablet on Tuesdays, Thursdays, and Saturdays. Recheck INR in 10 days post procedure. Coumadin Clinic [phone].

## 2022-05-31 NOTE — Telephone Encounter (Signed)
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  As such she does not need bridging for procedure.  Rocephin is probably standard preop anyway but would be beneficial from a standpoint of SBE prophylaxis as well.  I have attached note to my clinical pharmacist to ensure that the dosing of Lovenox is correct.  Glenetta Hew, MD

## 2022-06-05 ENCOUNTER — Encounter: Payer: Self-pay | Admitting: Cardiology

## 2022-06-07 NOTE — Telephone Encounter (Signed)
I reviewed the echocardiogram results.  The overall pump function of the left ventricle appears to be stable however the right ventricle seems to be not working as well and the right ventricle and right atrium are more dilated with evidence of high blood pressures in the lungs.  This would be more consistent with right-sided heart failure from existing lung disease on top of pre-existing mild left-sided dysfunction.  There is no evidence of pericardial effusion which would be a good concern for someone with lung cancer and that would be what we would consider "fluid or on the heart".  Fluid that is backed up from the heart not doing his job working blood pressures in the lungs is more consistent with either right or left-sided heart failure in this case it seems to be more consistent with right-sided heart failure with high blood pressures in the lungs.  I would not be surprised that with you undergoing treatment for lung cancer that the pressures in the lungs are higher from stiff lungs.  If there is a concern, the recommendation [place].  It was, right heart catheterization to assess the pressures.   Glenetta Hew, MD

## 2022-06-08 ENCOUNTER — Encounter: Payer: Self-pay | Admitting: Pharmacist Clinician (PhC)/ Clinical Pharmacy Specialist

## 2022-06-08 ENCOUNTER — Telehealth: Payer: Self-pay

## 2022-06-08 NOTE — Telephone Encounter (Signed)
Received call from pt stating he has another procedure (port placement) scheduled for 06/13/22.  Pt had previous procedure on 06/03/22 and was Lovenox bridged (see note on 05/30/22). Pt states he has been instructed by [facility] (Dr Marilynn Rail) to continue holding Warfarin and continue Lovenox injections for next procedure on 06/13/22. Pt states [facility] has provided him with appropriate Lovenox bridging instructions for this upcoming procedure. Instructed pt to recheck INR 1 week post procedure (06/20/22). Pt verbalized understanding.

## 2022-06-09 MED ORDER — ENOXAPARIN SODIUM 100 MG/ML IJ SOSY: 100.0000 mg | PREFILLED_SYRINGE | Freq: Two times a day (BID) | INTRAMUSCULAR | 1 refills | Status: DC

## 2022-06-09 NOTE — Telephone Encounter (Signed)
Probably wait until done treatments unless symptoms warrant doing it sooner.  [place]

## 2022-06-13 ENCOUNTER — Other Ambulatory Visit: Payer: Self-pay | Admitting: Cardiology

## 2022-06-16 ENCOUNTER — Encounter (HOSPITAL_COMMUNITY): Payer: Self-pay | Admitting: *Deleted

## 2022-06-19 LAB — POCT INR: INR: 1.8 — AB (ref 2.0–3.0)

## 2022-06-20 ENCOUNTER — Ambulatory Visit (INDEPENDENT_AMBULATORY_CARE_PROVIDER_SITE_OTHER): Payer: Medicare HMO

## 2022-06-20 DIAGNOSIS — Z5181 Encounter for therapeutic drug level monitoring: Secondary | ICD-10-CM

## 2022-06-20 NOTE — Patient Instructions (Signed)
Description   SELF TESTER: Called and spoke with pt. Instructed to continue Lovenox injections. Take 2 tablets today and 1.5 tablets tomorrow and then resume taking warfarin 1.5 tablets daily except for 1 tablet on Tuesdays, Thursdays, and Saturdays.  Recheck INR on Wednesday, 06/22/22.  [facility] [phone].

## 2022-06-22 ENCOUNTER — Ambulatory Visit (INDEPENDENT_AMBULATORY_CARE_PROVIDER_SITE_OTHER): Payer: Medicare HMO | Admitting: *Deleted

## 2022-06-22 DIAGNOSIS — I480 Paroxysmal atrial fibrillation: Secondary | ICD-10-CM | POA: Diagnosis not present

## 2022-06-22 DIAGNOSIS — Z952 Presence of prosthetic heart valve: Secondary | ICD-10-CM

## 2022-06-22 DIAGNOSIS — D6869 Other thrombophilia: Secondary | ICD-10-CM | POA: Diagnosis not present

## 2022-06-22 LAB — POCT INR: INR: 2.7 (ref 2.0–3.0)

## 2022-06-22 NOTE — Patient Instructions (Signed)
Description   SELF TESTER: Called and spoke with pt. Instructed to stop Lovenox injections. Resume taking warfarin 1.5 tablets daily except for 1 tablet on Tuesdays, Thursdays, and Saturdays.  Recheck INR in 1 week  [facility] [phone].

## 2022-06-27 ENCOUNTER — Ambulatory Visit (INDEPENDENT_AMBULATORY_CARE_PROVIDER_SITE_OTHER): Payer: Medicare HMO | Admitting: *Deleted

## 2022-06-27 DIAGNOSIS — I480 Paroxysmal atrial fibrillation: Secondary | ICD-10-CM | POA: Diagnosis not present

## 2022-06-27 DIAGNOSIS — D6869 Other thrombophilia: Secondary | ICD-10-CM | POA: Diagnosis not present

## 2022-06-27 DIAGNOSIS — Z952 Presence of prosthetic heart valve: Secondary | ICD-10-CM

## 2022-06-27 LAB — POCT INR: INR: 1.9 — AB (ref 2.0–3.0)

## 2022-06-27 NOTE — Patient Instructions (Addendum)
Description   SELF TESTER: Called and spoke with pt and instructed pt to take 2 tablets of warfarin today then start taking warfarin 1.5 tablets daily except for 1 tablet on Tuesdays and Saturdays. Recheck INR in 1 week. [facility]in Clinic [phone].

## 2022-07-04 ENCOUNTER — Ambulatory Visit (INDEPENDENT_AMBULATORY_CARE_PROVIDER_SITE_OTHER): Payer: Medicare HMO

## 2022-07-04 DIAGNOSIS — Z5181 Encounter for therapeutic drug level monitoring: Secondary | ICD-10-CM | POA: Diagnosis not present

## 2022-07-04 LAB — POCT INR: INR: 2.9 (ref 2.0–3.0)

## 2022-07-04 NOTE — Patient Instructions (Signed)
Description   SELF TESTER: Called and spoke with pt. Instructed pt to continue taking warfarin 1.5 tablets daily except for 1 tablet on Tuesdays and Saturdays.  Recheck INR in 2 weeks. [facility] [phone].

## 2022-07-06 ENCOUNTER — Telehealth: Payer: Self-pay

## 2022-07-06 ENCOUNTER — Other Ambulatory Visit: Payer: Self-pay

## 2022-07-06 ENCOUNTER — Encounter: Payer: Self-pay | Admitting: Cardiology

## 2022-07-06 MED ORDER — VALSARTAN 160 MG PO TABS: 160.0000 mg | ORAL_TABLET | Freq: Every day | ORAL | 3 refills | Status: DC

## 2022-07-06 NOTE — Telephone Encounter (Signed)
Spoke with Dr Sallyanne Kuster- DOD and orders received to cut Valsartan in half= '160mg'$  daily. For patient to stay hydrated and keep a log of BP'S.   Called pt and gave the above instructions. Pt said he can just cut his tablets in half, no need to call in a prescription.  Updated dosage in chart. Instructed to call if he becomes symptomatic and for any questions/concerns. Pt verbalized understanding of all instructions.

## 2022-07-06 NOTE — Addendum Note (Signed)
Addended by: Sharee Holster on: 07/06/2022 05:01 PM   Modules accepted: Orders

## 2022-07-06 NOTE — Telephone Encounter (Signed)
Pt states no dizziness, not light-headed, no sob, no blurred vision, no sweating; but reports getting fatigued easily.  He is currently getting Chemo and Radiation as well These readings were while at the Dr's office. But pt not having any symptoms.  Pt currently takes Amlodipine '10mg'$ , Toprol XL '25mg'$ , and valsartan '320mg'$  daily.  Asked him to keep a log of BP's daily. I will send this information to the provider and get back to him with any recommendations. Told him to notify us if he becomes symptomatic. He verbalized understanding.

## 2022-07-06 NOTE — Telephone Encounter (Signed)
Attempt to call the patient unable to leave a message. Will forward to MD and nurse.

## 2022-07-07 NOTE — Telephone Encounter (Signed)
Agree with patient keeping BP log. If he is asymptomatic I am okay with that pressure. If he becomes symptomatic he can cut amlodipine in half.  Thank you! DW

## 2022-07-13 NOTE — Telephone Encounter (Signed)
That is fine -- can hold  [facility]

## 2022-07-13 NOTE — Telephone Encounter (Signed)
Can hold  [facility]

## 2022-07-13 NOTE — Addendum Note (Signed)
Addended by: Waylan Rocher on: 07/13/2022 05:28 PM   Modules accepted: Orders

## 2022-07-18 ENCOUNTER — Other Ambulatory Visit: Payer: Self-pay | Admitting: Cardiology

## 2022-07-18 DIAGNOSIS — Z5181 Encounter for therapeutic drug level monitoring: Secondary | ICD-10-CM

## 2022-07-18 NOTE — Telephone Encounter (Signed)
Refill request for warfarin:  Last INR was 2.9 on 07/04/22 Next INR due 07/18/22 LOV was 04/20/22  D Whittenborn NP  Refill approved.

## 2022-07-19 ENCOUNTER — Ambulatory Visit (INDEPENDENT_AMBULATORY_CARE_PROVIDER_SITE_OTHER): Payer: Medicare HMO

## 2022-07-19 ENCOUNTER — Telehealth: Payer: Self-pay | Admitting: *Deleted

## 2022-07-19 DIAGNOSIS — D6869 Other thrombophilia: Secondary | ICD-10-CM | POA: Diagnosis not present

## 2022-07-19 DIAGNOSIS — Z952 Presence of prosthetic heart valve: Secondary | ICD-10-CM

## 2022-07-19 DIAGNOSIS — I480 Paroxysmal atrial fibrillation: Secondary | ICD-10-CM | POA: Diagnosis not present

## 2022-07-19 LAB — POCT INR: INR: 7.5 — AB (ref 2.0–3.0)

## 2022-07-19 NOTE — Telephone Encounter (Signed)
Called pt INR was due on yesterday. There was no answer therefore, left a message for pt to call back.

## 2022-07-19 NOTE — Patient Instructions (Signed)
Description   SELF TESTER: Called and spoke with pt. INR 7.5 pt has not taken today's dosage of Warfarin, will hold Warfarin x 3 days and recheck INR on Friday 07/22/22.  Pt instructed to go to the ED with bleeding problems.  [facility] [phone].

## 2022-07-21 ENCOUNTER — Ambulatory Visit (INDEPENDENT_AMBULATORY_CARE_PROVIDER_SITE_OTHER): Payer: Medicare HMO | Admitting: *Deleted

## 2022-07-21 DIAGNOSIS — I480 Paroxysmal atrial fibrillation: Secondary | ICD-10-CM

## 2022-07-21 DIAGNOSIS — Z952 Presence of prosthetic heart valve: Secondary | ICD-10-CM

## 2022-07-21 DIAGNOSIS — D6869 Other thrombophilia: Secondary | ICD-10-CM | POA: Diagnosis not present

## 2022-07-21 LAB — POCT INR: INR: 4.8 — AB (ref 2.0–3.0)

## 2022-07-21 NOTE — Patient Instructions (Signed)
Description   SELF TESTER: Called and spoke with pt. Instructed to HOLD today's dose and only take 1/2 tablet tomorrow. Than resume taking 1.5 tablets daily EXCEPT 1 tablet on Tuesdays and Saturdays.  Pt instructed to go to the ED with bleeding problems.  [facility] [phone].

## 2022-07-23 ENCOUNTER — Encounter: Payer: Self-pay | Admitting: Cardiology

## 2022-07-25 ENCOUNTER — Telehealth: Payer: Self-pay | Admitting: Cardiology

## 2022-07-25 NOTE — Telephone Encounter (Signed)
Patient calling in asking that the nurse ready his mychart message. Please advise

## 2022-07-25 NOTE — Telephone Encounter (Signed)
See mychart message- will respond.

## 2022-07-27 ENCOUNTER — Telehealth: Payer: Self-pay

## 2022-07-27 HISTORY — PX: TRANSTHORACIC ECHOCARDIOGRAM: SHX275

## 2022-07-27 NOTE — Telephone Encounter (Signed)
Pt's INR due today. Pt currently hospitalized. Called pt and instructed him to call coumadin clinic once he is discharged. Pt verbalized understanding.

## 2022-07-27 NOTE — Telephone Encounter (Signed)
I would stop it while you are having her treatments.  And if your blood pressure remains low, we may need to back down to valsartan.  I am not worried about the rebound effect of being off of the metoprolol since you are also on amiodarone.  Glenetta Hew, MD

## 2022-07-28 ENCOUNTER — Telehealth: Payer: Self-pay | Admitting: Cardiology

## 2022-07-28 NOTE — Telephone Encounter (Signed)
Reviewed the chart, unfortunately the note is not very forthcoming.  It looks like they diuresed him but I cannot tell what they did.  I think it was probably a combination of CHF and also effect of his lung cancer chemotherapy.  Glenetta Hew, MD

## 2022-07-28 NOTE — Telephone Encounter (Signed)
Pt called in stating he was diagnosed with CHF at [facility], and that they took the fluid off of him at that visit. He states he feels dehydrated and weak. He just wants to let Dr. Ellyn Hack know, and but states as long as he is able to see the notes from [facility], he don't need a call back.

## 2022-07-28 NOTE — Telephone Encounter (Signed)
RN reviewed patient chart in Care everywhere - patient was seen Erik Meyer - [facility]  on 07/26/22 .   Information is available  for Erik Meyer to review

## 2022-08-09 ENCOUNTER — Ambulatory Visit (INDEPENDENT_AMBULATORY_CARE_PROVIDER_SITE_OTHER): Payer: Medicare HMO

## 2022-08-09 DIAGNOSIS — I480 Paroxysmal atrial fibrillation: Secondary | ICD-10-CM | POA: Diagnosis not present

## 2022-08-09 DIAGNOSIS — Z952 Presence of prosthetic heart valve: Secondary | ICD-10-CM

## 2022-08-09 DIAGNOSIS — D6869 Other thrombophilia: Secondary | ICD-10-CM | POA: Diagnosis not present

## 2022-08-09 LAB — POCT INR: INR: 5.8 — AB (ref 2.0–3.0)

## 2022-08-09 NOTE — Patient Instructions (Signed)
Description   SELF TESTER: Called and spoke with pt. Instructed to HOLD today's dose and tomorrow's dose and then START taking 1.5 tablets daily EXCEPT 1 tablet on Tuesdays, Thursday, and Saturdays.  Pt instructed to go to the ED with bleeding problems.  [facility] [phone].

## 2022-08-10 ENCOUNTER — Ambulatory Visit: Payer: Medicare HMO | Admitting: Nurse Practitioner

## 2022-08-15 ENCOUNTER — Encounter: Payer: Self-pay | Admitting: Cardiology

## 2022-08-15 ENCOUNTER — Other Ambulatory Visit: Payer: Self-pay | Admitting: Cardiology

## 2022-08-16 ENCOUNTER — Ambulatory Visit (INDEPENDENT_AMBULATORY_CARE_PROVIDER_SITE_OTHER): Payer: Medicare HMO

## 2022-08-16 DIAGNOSIS — I480 Paroxysmal atrial fibrillation: Secondary | ICD-10-CM | POA: Diagnosis not present

## 2022-08-16 DIAGNOSIS — Z952 Presence of prosthetic heart valve: Secondary | ICD-10-CM

## 2022-08-16 DIAGNOSIS — D6869 Other thrombophilia: Secondary | ICD-10-CM

## 2022-08-16 LAB — POCT INR: INR: 4.2 — AB (ref 2.0–3.0)

## 2022-08-16 NOTE — Patient Instructions (Addendum)
Description   SELF TESTER: Called and spoke with pt. Instructed to HOLD today's dosage of Warfarin, then resume same dosage 1.5 tablets daily EXCEPT 1 tablet on Tuesdays, Thursday, and Saturdays.  Pt instructed to go to the ED with bleeding problems.  [facility] [phone].

## 2022-08-17 NOTE — Telephone Encounter (Signed)
I think he would need to be seen to clarify.  DH

## 2022-08-17 NOTE — Telephone Encounter (Signed)
Probably would need to see APP (preferably one she has seen before)  [facility]

## 2022-08-18 NOTE — Telephone Encounter (Signed)
Call to patient since no answer regarding appt. With My chart.  He states it is not that important to move appt up and will continue with what he is taking, until his appt 09/08/22.

## 2022-08-23 LAB — POCT INR: INR: 8 — AB (ref 2.0–3.0)

## 2022-08-24 ENCOUNTER — Ambulatory Visit (INDEPENDENT_AMBULATORY_CARE_PROVIDER_SITE_OTHER): Payer: Medicare HMO | Admitting: Pharmacist

## 2022-08-24 DIAGNOSIS — Z952 Presence of prosthetic heart valve: Secondary | ICD-10-CM | POA: Diagnosis not present

## 2022-08-24 DIAGNOSIS — D6869 Other thrombophilia: Secondary | ICD-10-CM | POA: Diagnosis not present

## 2022-08-24 DIAGNOSIS — I480 Paroxysmal atrial fibrillation: Secondary | ICD-10-CM

## 2022-08-24 NOTE — Patient Instructions (Addendum)
Description   SELF TESTER: Patient called. Started Bactrim over the weekend. Has 3 more dosages left. Advised to get greens from grocery store. Did not take dose yesterday. Hold today, tomorrow and will recheck Friday and call us for weekend plan. .  [facility] [phone].

## 2022-08-26 ENCOUNTER — Ambulatory Visit (INDEPENDENT_AMBULATORY_CARE_PROVIDER_SITE_OTHER): Payer: Medicare HMO

## 2022-08-26 DIAGNOSIS — I480 Paroxysmal atrial fibrillation: Secondary | ICD-10-CM

## 2022-08-26 DIAGNOSIS — D6869 Other thrombophilia: Secondary | ICD-10-CM | POA: Diagnosis not present

## 2022-08-26 DIAGNOSIS — Z952 Presence of prosthetic heart valve: Secondary | ICD-10-CM

## 2022-08-26 LAB — POCT INR: INR: 3 (ref 2.0–3.0)

## 2022-08-26 NOTE — Patient Instructions (Signed)
Description   SELF TESTER: Patient called. Completed Bactrim 08/25/22, resume previous Warfarin dosage regimen 1.5 tablets daily except 1 tablet on Tuesdays, Thursdays and Saturdays.  Recheck in 1 week.  [facility] [phone].

## 2022-08-30 ENCOUNTER — Ambulatory Visit (INDEPENDENT_AMBULATORY_CARE_PROVIDER_SITE_OTHER): Payer: Medicare HMO

## 2022-08-30 DIAGNOSIS — Z952 Presence of prosthetic heart valve: Secondary | ICD-10-CM

## 2022-08-30 DIAGNOSIS — D6869 Other thrombophilia: Secondary | ICD-10-CM

## 2022-08-30 DIAGNOSIS — I480 Paroxysmal atrial fibrillation: Secondary | ICD-10-CM

## 2022-08-30 LAB — POCT INR: INR: 2.4 (ref 2.0–3.0)

## 2022-08-30 NOTE — Patient Instructions (Signed)
Description   SELF TESTER: Spoke with pt, advised to continue on same dosage of Warfarin 1.5 tablets daily except 1 tablet on Tuesdays, Thursdays and Saturdays.  Recheck in 1 week.  [facility] [phone].

## 2022-08-31 NOTE — Progress Notes (Unsigned)
Cardiology Clinic Note   Date: 09/01/2022 ID: Erik Meyer, DOB [birth year], MRN [id number]  Primary Cardiologist:  Bryan Lemma, MD  Patient Profile    Erik Meyer is a 82 y.o. male who presents to the clinic today for hospital follow-up.  Past medical history significant for: CAD.  CABG x 4 12/26/2002: LIMA to LAD, sequential SVG to RPDA and PL, SVG to diag.  Myoview stress test 03/21/2017: Defect 1: There is a small defect of severe severity present in the basal inferior lateral and mid inferior lateral location.  Consistent with prior infarct.  Defect 2: There is a small defect of moderate severity present in the mid anterior and apical anterior location.  Consistent with prior infarct with very mild peri-infarct ischemia. LHC 04/21/2017: Severe native CAD involving mostly the LAD. Grafted PDA occluded along with sequential SVG PDA/PL. SVG to diag 1 occluded.  Myoview stress test 10/28/2020: No ST segment deviation or T wave inversion noted during stress.  Defect 1: There is a medium defect of severe severity present in the basal inferior lateral and mid inferior lateral location.  Defect 2: There is a medium defect of moderate severity present in the mid anterior, or apical anterior and apex location.  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 LHC 11/11/2020: Severe single vessel CAD with extensive mid to distal LAD disease and distal occlusion at LIMA to LAD graft insertion with patent LIMA to LAD.  SVG to PDA/PL occluded.  SVG to diagonal 1 occluded. PAF. 14 day monitor 12/15/2020: Predominate rhythm is sinus with first degree AV block. Min HR 55, Max 139, avg 74. One 6 beat run VT at 126 bpm. 859 bursts of PAT/SVT at 171 bpm, longest 4 minutes. Rare isolated PACs and PVCs. Short spells of ventricular bigeminy and trigeminy. No obvious afib.  Monitors at home with [place] mobile.  PRN amiodarone load for breakthrough afib.  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 Echo 07/28/2022: Aortic valve functioning normally.  Peak/mean gradients 13/8 mmHg. Chronic diastolic heart failure.  Echo 07/28/2022 performed at [facility]: EF 45 to 50%.  Moderate DD.  Increased LA pressure.  Moderate RVH/RAE.  Aortic valve functioning normally with peak/mean gradients 13/8 mmHg. Hypertension.  Hyperlipidemia.  Lipid panel 01/25/2022: LDL 40, HDL 114, TG 50, total 165.  Lungs CA. 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   History of Present Illness    Erik Meyer is a long time patient of cardiology. He became a patient of Dr. Herbie Baltimore in May 2015.  Patient has a long cardiology history including CABG x 4 and aortic valve replacement in August 2004.  He underwent LHC x 2 in 2018 and 2022 following an abnormal stress tests.  After his last stress test in June 2022 it was noted that with 2 old MIs on stress test it is difficult to tell what is new and what is old.  If patient develops chest pain in the future question the utility of myoview stress testing.   Patient was last seen in the office by me on 04/20/2022.  At that time he complained of becoming easily fatigued when he was active for long periods of time.  He continued to do water aerobics several times a week at the [facility].  Overall he was doing well and no medication changes were made.  Today, patient is here alone.  He reports hospitalization for acute on chronic  combined systolic and diastolic heart failure in March at [facility] in [place].  Patient reports he presented to the ED for chest pain and shortness of breath.  He underwent diuresis prior to discharge.  Recommendations for medication changes were made (begin Farxiga and losartan, stop valsartan and potassium).  Patient did not want to start these medications without being seen here.  He reports since being discharged from the hospital he continues to have dyspnea with a  small amount of exertion and tightness/bloating in his abdomen.  [facility] and is trying to advance activities at home.  He reports he also irritated his back while in the hospital and has been walking with a rollator for balance.  Patient finished chemotherapy approximately 3 weeks ago and radiation approximately 2 weeks ago for lung cancer.  No lower extremity edema, orthopnea, PND.  No palpitations.  He monitors his rhythm with a [employer] mobile device.  He is not needed amiodarone recently.  Patient mentions that he was treated for a UTI after discharge from hospital.      ROS: All other systems reviewed and are otherwise negative except as noted in History of Present Illness.  Studies Reviewed    ECG is not ordered today.  Risk Assessment/Calculations     CHA2DS2-VASc Score = 7   This indicates a 11.2% annual risk of stroke. The patient's score is based upon: CHF History: 1 HTN History: 1 Diabetes History: 0 Stroke History: 2 Vascular Disease History: 1 Age Score: 2 Gender Score: 0             Physical Exam    VS:  BP 112/84 (BP Location: Right Arm, Patient Position: Sitting, Cuff Size: Normal)   Pulse 63   Ht  (1.753 m)   Wt 181 lb 6.4 oz (82.3 kg)   SpO2 95%   BMI 26.79 kg/m  , BMI Body mass index is 26.79 kg/m.  GEN: Well nourished, well developed, in no acute distress. Neck: No JVD or carotid bruits. Cardiac:  RRR. 2/6 systolic murmur. No rubs or gallops.   Respiratory:  Respirations regular and unlabored. Clear to auscultation without rales, wheezing or rhonchi. GI: Soft, nontender, nondistended. Extremities: Radials/DP/PT 2+ and equal bilaterally. No clubbing or cyanosis. No edema.  Skin: Warm and dry, no rash. Neuro: Strength intact.  Assessment & Plan    Chronic combined systolic and diastolic heart failure.  Recent hospital admission for acute on chronic combined systolic and diastolic heart failure at  [facility].  Echo March 2024 EF 45 to 50%, moderate DD, moderate RVH/RAE.  Patient reports dyspnea with a small amount of exertion and tightness/bloating in his abdomen.  He denies lower extremity edema, orthopnea, PND.  Discharge recommendations from [facility] included starting Farxiga and losartan and stopping valsartan and potassium.  Patient did not want to make these changes until he was evaluated here.  Will obtain a BMP and BNP today.  If BNP is elevated will diurese patient with Lasix x 3 days and keep upcoming appointment with Bernadene Person, NP next week.  If BMP is normal will cancel appointment with Irving Burton and have patient follow-up with Dr. Herbie Baltimore as previously scheduled in June.  Will obtain echo prior to June visit with Dr. Herbie Baltimore. CAD. S/p CABG x 11 December 2002. LHC July 2022 showed severe single vessel CAD with extensive mid to distal LAD disease and distal occlusion of LIMA to LAD graft  insertion with patent LIMA to LAD. Occluded SVG to RPDA and PL and SVG to diag. Patient denies chest pain, pressure or tightness. Continue coumadin, metoprolol, rosuvastatin, and Ranexa. Not on antiplatelet therapy in the setting of coumadin.  PAF. CHADS-VASc score of 7 (11.2%).14 day monitor in August 2022 showed 1 6 beat run of VT, 859 bursts of PAT/SVT, rare PACs and PVCs and no obvious afib.  Denies palpitations.  Patient monitors rhythm at home with [facility] mobile. He reports he has not needed amiodarone recently.  Regular rate and rhythm on exam today.  Continue amiodarone load as previously instructed (400 mg twice a day x 5 days, if not converted 100 mg x 5 more days) for breakthrough. Continue coumadin and metoprolol.  Aortic valve replacement. St Jude mechanical valve implantation August 2004. Echo March 2024 showed normal function and structure of mechanical valve. Continue coumadin. He will need SBE prophylaxis for dental procedures.  Hypertension. BP 134/62 today.  Patient denies headaches or dizziness. Continue metoprolol, valsartan, and amlodipine.  Hyperlipidemia. LDL 01/25/2022 40, at goal. Continue rosuvastatin.   Disposition: BNP and BMP today.  Echo prior to June follow-up with Dr. Herbie Baltimore.  If BNP is elevated will diurese patient with Lasix x 3 days and have him keep previously scheduled appointment with Bernadene Person, NP in 1 week.  If BNP is normal we will cancel appointment with Irving Burton and have patient keep previously scheduled follow-up with Dr. Herbie Baltimore in June.         Signed, Etta Grandchild. Raymar Joiner, DNP, NP-C

## 2022-09-01 ENCOUNTER — Encounter: Payer: Self-pay | Admitting: Student

## 2022-09-01 ENCOUNTER — Ambulatory Visit: Payer: Medicare HMO | Attending: Student | Admitting: Student

## 2022-09-01 VITALS — BP 112/84 | HR 63 | Ht 69.0 in | Wt 181.4 lb

## 2022-09-01 DIAGNOSIS — I5042 Chronic combined systolic (congestive) and diastolic (congestive) heart failure: Secondary | ICD-10-CM

## 2022-09-01 DIAGNOSIS — I25810 Atherosclerosis of coronary artery bypass graft(s) without angina pectoris: Secondary | ICD-10-CM

## 2022-09-01 DIAGNOSIS — I480 Paroxysmal atrial fibrillation: Secondary | ICD-10-CM | POA: Diagnosis not present

## 2022-09-01 DIAGNOSIS — Z952 Presence of prosthetic heart valve: Secondary | ICD-10-CM | POA: Diagnosis not present

## 2022-09-01 DIAGNOSIS — I10 Essential (primary) hypertension: Secondary | ICD-10-CM

## 2022-09-01 DIAGNOSIS — E785 Hyperlipidemia, unspecified: Secondary | ICD-10-CM

## 2022-09-01 NOTE — Patient Instructions (Addendum)
Medication Instructions:  No Changes *If you need a refill on your cardiac medications before your next appointment, please call your pharmacy*  Lab Work: Your physician recommends that you have lab work today: CMET BNP   If you have labs (blood work) drawn today and your tests are completely normal, you will receive your results only by: MyChart Message (if you have MyChart) OR A paper copy in the mail If you have any lab test that is abnormal or we need to change your treatment, we will call you to review the results.   Testing/Procedures: Your physician has requested that you have an echocardiogram in June of 2024, before your next appointment with Dr. Herbie Baltimore. Echocardiography is a painless test that uses sound waves to create images of your heart. It provides your doctor with information about the size and shape of your heart and how well your heart's chambers and valves are working. This procedure takes approximately one hour. There are no restrictions for this procedure. Please do NOT wear cologne, perfume, aftershave, or lotions (deodorant is allowed). Please arrive 15 minutes prior to your appointment time.    Follow-Up: At [facility], you and your health needs are our priority.  As part of our continuing mission to provide you with exceptional heart care, we have created designated Provider Care Teams.  These Care Teams include your primary Cardiologist (physician) and Advanced Practice Providers (APPs -  Physician Assistants and Nurse Practitioners) who all work together to provide you with the care you need, when you need it.    Keep Your Next Scheduled appointment with:  Provider:   Bryan Lemma, MD  or Bernadene Person, NP

## 2022-09-02 ENCOUNTER — Telehealth: Payer: Self-pay | Admitting: Cardiology

## 2022-09-02 LAB — BASIC METABOLIC PANEL
BUN/Creatinine Ratio: 18 (ref 10–24)
BUN: 14 mg/dL (ref 8–27)
CO2: 24 mmol/L (ref 20–29)
Calcium: 8.7 mg/dL (ref 8.6–10.2)
Chloride: 99 mmol/L (ref 96–106)
Creatinine, Ser: 0.8 mg/dL (ref 0.76–1.27)
Glucose: 99 mg/dL (ref 70–99)
Potassium: 5.4 mmol/L — ABNORMAL HIGH (ref 3.5–5.2)
Sodium: 134 mmol/L (ref 134–144)
eGFR: 89 mL/min/{1.73_m2} (ref >59)

## 2022-09-02 LAB — BRAIN NATRIURETIC PEPTIDE: BNP: 857.3 pg/mL — ABNORMAL HIGH (ref 0.0–100.0)

## 2022-09-02 MED ORDER — FUROSEMIDE 20 MG PO TABS: 20.0000 mg | ORAL_TABLET | ORAL | 2 refills | Status: DC | PRN

## 2022-09-02 NOTE — Telephone Encounter (Signed)
Per Carlos Levering NP . Here is her response:  Please let patient know the marker that shows he is holding onto extra fluid is elevated. Would like him to take Lasix 20 mg for the next 3 days. Weight daily. Potassium is slightly elevated. Avoid foods that are high in potassium such as potatoes, sweet potatoes, citrus fruits and dried fruits. Keep office visit schedule for 5/2 with Irving Burton. Bring weight log to that visit. Thank you!   DW

## 2022-09-02 NOTE — Telephone Encounter (Signed)
Patient is calling to follow up on his lab results.

## 2022-09-02 NOTE — Telephone Encounter (Signed)
RN spoke to patient . Patient is aware to start Lasix 20 mg for 3 days  and Keep weight log and bring to office appointment 09/08/22  Patient verbalized understanding. Aware medication will sent to pharmacy.Marland Kitchen

## 2022-09-06 ENCOUNTER — Ambulatory Visit (INDEPENDENT_AMBULATORY_CARE_PROVIDER_SITE_OTHER): Payer: Medicare HMO

## 2022-09-06 DIAGNOSIS — D6869 Other thrombophilia: Secondary | ICD-10-CM

## 2022-09-06 DIAGNOSIS — Z952 Presence of prosthetic heart valve: Secondary | ICD-10-CM | POA: Diagnosis not present

## 2022-09-06 DIAGNOSIS — I480 Paroxysmal atrial fibrillation: Secondary | ICD-10-CM | POA: Diagnosis not present

## 2022-09-06 LAB — POCT INR: INR: 3.6 — AB (ref 2.0–3.0)

## 2022-09-06 NOTE — Patient Instructions (Signed)
Description   SELF TESTER: Spoke with pt, advised to start taking Warfarin 1 tablet daily except 1.5 tablets on Mondays, Wednesdays and Fridays.  Recheck in 1 week (usually a 2 week self-tester).  Coumadin Clinic [phone].

## 2022-09-08 ENCOUNTER — Encounter: Payer: Self-pay | Admitting: Nurse Practitioner

## 2022-09-08 ENCOUNTER — Other Ambulatory Visit: Payer: Self-pay

## 2022-09-08 ENCOUNTER — Ambulatory Visit: Payer: Medicare HMO | Attending: Nurse Practitioner | Admitting: Nurse Practitioner

## 2022-09-08 VITALS — BP 126/52 | HR 73 | Ht 69.0 in | Wt 182.4 lb

## 2022-09-08 DIAGNOSIS — I5020 Unspecified systolic (congestive) heart failure: Secondary | ICD-10-CM

## 2022-09-08 DIAGNOSIS — I480 Paroxysmal atrial fibrillation: Secondary | ICD-10-CM | POA: Diagnosis not present

## 2022-09-08 DIAGNOSIS — I10 Essential (primary) hypertension: Secondary | ICD-10-CM

## 2022-09-08 DIAGNOSIS — Z952 Presence of prosthetic heart valve: Secondary | ICD-10-CM | POA: Diagnosis not present

## 2022-09-08 DIAGNOSIS — I2510 Atherosclerotic heart disease of native coronary artery without angina pectoris: Secondary | ICD-10-CM | POA: Diagnosis not present

## 2022-09-08 DIAGNOSIS — E785 Hyperlipidemia, unspecified: Secondary | ICD-10-CM

## 2022-09-08 DIAGNOSIS — I5022 Chronic systolic (congestive) heart failure: Secondary | ICD-10-CM | POA: Diagnosis not present

## 2022-09-08 LAB — BASIC METABOLIC PANEL
CO2: 25 mmol/L (ref 20–29)
Chloride: 99 mmol/L (ref 96–106)
eGFR: 89 mL/min/{1.73_m2} (ref >59)

## 2022-09-08 LAB — BRAIN NATRIURETIC PEPTIDE

## 2022-09-08 NOTE — Progress Notes (Signed)
Office Visit    Patient Name: Erik Meyer Date of Encounter: 09/08/2022  Primary Care Provider:  Pearson Forster, MD Primary Cardiologist:  Bryan Lemma, MD  Chief Complaint    82 year old male with a history of CAD s/p CABG x 4 (LIMA-LAD, SVG-RPDA and PL, SVG-diagonal) in 2004, paroxysmal atrial fibrillation, aortic valve replacement/Bentall procedure in 2004, chronic diastolic heart failure, hypertension, hyperlipidemia, and lung cancer who presents for follow-up related to heart failure.  Past Medical History    Past Medical History:  Diagnosis Date   Anemia    CAD, multiple vessel 12/2002   Found during preop cath for [facility] AVR procedure; Myoview 5/'13: No ischemia or infarct, EF 53% --> cath December 2018: Proximal LAD 65% followed by mid LAD 65% then 100% occlusion.  Patent LIMA-LAD.  SVG-D2 occluded.  RPDA occluded. Seq SVG-r PDA-PL patent to PDA, but the PDA is occluded as is the proximal native PDA; Similar Findings 11/2020   COPD (chronic obstructive pulmonary disease) (HCC)    Emphysema noted on CT scan.   Diverticulosis    Dyslipidemia, goal LDL below 70    Monitored by PCP. On statin (last labs scan from September 2015: TC 149, TG 127, HDL 55, LDL 69)   ED (erectile dysfunction)    GERD (gastroesophageal reflux disease)    History of SBO (small bowel obstruction) 07/2013   Abdominal surgery with LOA   Hx of irritable bowel syndrome    Hypertension, essential    Lesion of right native kidney 08/20/2013   Abdominal US : 2.4 cm x 1.7 complex lesion in the upper pole the right kidney in March of 2012 concerning for RCC although enlarging hemorrhagic cyst could have this appearance as well.,    Monoclonal gammopathy of undetermined significance    Paroxysmal-persistent atrial fibrillation (HCC); CHA2DS2-VASc Score 7 03/23/2017   CHA2DS2-VASc (CHF, HTN, CAD, agex2, TIAx2 = 7)-> Most recent episode January 3-5, 2022; unsuccessful cardioversion with amiodarone; ->  went to ER-synchronized DCCV x1   Psoriasis    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 Laneta SimmersZachary George procedure - St. Jude aVR (25 mm prosthesis) with aortic root conduit;; Echo 10/'14: EF 55-60%, Mod Conc LVH, Gr 1 DD, Well Seated AoV Mech Prosthesis with normal P gradients (no stenosis), Mod-Severe LA dilation --> follow-up echo November 2016: Well functioning mechanical valve. Paradoxical septal motion. EF 50-55%. Severe LA dilation. Moderate RA dilation.    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 JUDE mechanical valve conduit with reimplantation of the coronary arteries   CARDIAC CATHETERIZATION  12/20/2002   diffuse  coronary disease,prior to his BENTALL PROCEDURE   CORONARY ARTERY BYPASS GRAFT  12/26/2002   LIMA to LAD,SVG to diagonal, SVG to PDA and PLA.    CORONARY/GRAFT ANGIOGRAPHY N/A 04/21/2017   Procedure: CORONARY/GRAFT ANGIOGRAPHY;  Surgeon: Marykay Lex, MD;  Location: [facility] INVASIVE CV LAB;  Service: Cardiovascular; Native RCA ~patent to PAV/PL, Native LM-LCx(OM1 & OM2), LAD-D1 & D2 then 100% after SP2). Patent LIMA-dLAD, occluded SeqSVG-RPDA-PL & SVG-D2. --. FINDINGS CORELATED WELL WITH PERFUSION DEFECT SEEN ON MYOVIEW - INFERIOR INFARCT &  ANTERIOR INFARCT. --Med Rx   CT-Guided Lung Biopsy  07/22/2021   [facility]   EXERCISE TOLERANCE TEST  09/12/2017    5 METS Baseline ST depression.  No new changes with exercise.  Heart rate went to 141 bpm.  Poor exercise tolerance reaching target heart rate at 2:26-minute,.  No chronotropic Incompetence   LAPAROSCOPIC LYSIS OF ADHESIONS  07/2013   performed in  [place] for SBO    LEFT HEART CATH AND CORONARY ANGIOGRAPHY N/A 11/11/2020   Procedure: LEFT HEART CATH AND CORONARY ANGIOGRAPHY;  Surgeon: Marykay Lex, MD;  Location: [facility] INVASIVE CV LAB;  Service: Cardiovascular;   pLAD 65%, mLAd 65% (stable) -> 100% CTO. Patent LIMA-dLAD. Ost rPDA 100% CTO with patent RPL system.. SVG-D2 100%. SVG-rPDA-PL occluded. Native LCx - ~normal.  STABLE FINDINGS (Still correlate with Myoview)   NM MYOVIEW LTD  10/04/2011   09/2011: showed no ischemia;EF52%; 03/2017 - [facility]) HIGH RISK.  Mild LV dilation.  EF 45 to 50%..  Concern for prior infarct in the inferior lateral wall and apical anterior wall consistent with infarct with peri-infarct ischemia. => On review, these findings correlate with cardiac catheter showing inferior infarct and anterior infarct.   NM MYOVIEW LTD  10/28/2020   Equivocal Findings;   EF ~45-50%. No EKG changes. Medium Size - Severe defect - basal inferolateral & mid inferolateral. Also Medium Size-Severe defect in mid Anterior, apical Anterior & Apex. C/w Prior MI with Peri-Infarct Ischemia. - INTERMEDIATE RISK - STABLE FROM 2018   PARTIAL COLECTOMY  1994   THORACIC AORTIC ANEURYSM REPAIR  2004   Saint Jude aortic valve conduit-Bentall AVR   TRANSTHORACIC ECHOCARDIOGRAM  06/21/2021   [facility]) EF 60 to 65%.  Mild diastolic dysfunction.  Normal LAP.  Moderate LA dilation.  Moderate RV dilation.  Moderate-severe RA dilation.  Very mild AI.   TRANSTHORACIC ECHOCARDIOGRAM  11/2018    Stable EF 45-50%-moderate HK of apical-anteroseptal wall..  Moderate asymmetric LVH with GR 1 DD.  Normal RV.  RA mildly dilated.  Moderate MAC.   TRANSTHORACIC ECHOCARDIOGRAM  11/11/2020   EF 45 to 50%.  Mildly decreased function.  Inferolateral hypokinesis and small area of anteroseptal wall motion abnormality.  RV is borderline size with moderate dysfunction-normal CVP.Marland Kitchen  Both atria are moderately dilated.  25 mm bileaflet mechanical aortic  valve in place.  Normal function and normal gradients..   VENTRAL HERNIA REPAIR     repair with mesh by Dr Loreta Ave at [facility]    Allergies  Allergies  Allergen Reactions   Duloxetine     Other Reaction(s): Afib w/ RVR   Doxycycline Rash     Labs/Other Studies Reviewed    The following studies were reviewed today: Myoview stress test 03/21/2017: Defect 1: There is a small defect of severe severity present in the basal inferior lateral and mid inferior lateral location.  Consistent with prior infarct.  Defect 2: There is a small defect of moderate severity present in the mid anterior and apical anterior location.  Consistent with prior infarct with very mild peri-infarct ischemia. LHC 04/21/2017: Severe native CAD involving mostly the LAD. Grafted PDA occluded along with sequential SVG PDA/PL. SVG to diag 1 occluded.  Myoview stress test 10/28/2020: No ST segment deviation or T wave inversion noted during stress.  Defect 1: There is a medium defect of severe severity present in the basal inferior lateral and mid inferior lateral location.  Defect 2: There is a medium defect of moderate severity present  in the mid anterior, or apical anterior and apex location.  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 LHC 11/11/2020: Severe single vessel CAD with extensive mid to distal LAD disease and distal occlusion at LIMA to LAD graft insertion with patent LIMA to LAD.  SVG to PDA/PL occluded.  SVG to diagonal 1 occluded. 14 day monitor 12/15/2020: Predominate rhythm is sinus with first degree AV block. Min HR 55, Max 139, avg 74. One 6 beat run VT at 126 bpm. 859 bursts of PAT/SVT at 171 bpm, longest 4 minutes. Rare isolated PACs and PVCs. Short spells of ventricular bigeminy and trigeminy. No obvious afib.  Echo 07/28/2022 performed at [facility]: EF 45 to 50%.  Moderate DD.  Increased LA pressure.  Moderate RVH/RAE.  Aortic valve functioning normally with  peak/mean gradients 13/8 mmHg. Recent Labs: 04/20/2022: Hemoglobin 11.3; Platelets 143 09/01/2022: BNP 857.3; BUN 14; Creatinine, Ser 0.80; Potassium 5.4; Sodium 134  Recent Lipid Panel    Component Value Date/Time   CHOL 132 11/11/2020 0319   CHOL 152 09/19/2019 0940   TRIG 65 11/11/2020 0319   HDL 65 11/11/2020 0319   HDL 76 09/19/2019 0940   CHOLHDL 2.0 11/11/2020 0319   VLDL 13 11/11/2020 0319   LDLCALC 54 11/11/2020 0319   LDLCALC 61 09/19/2019 0940    History of Present Illness    82 year old male with the above past medical history including CAD s/p CABG x 4 (LIMA-LAD, SVG-RPDA and PL, SVG-diagonal) in 2004, paroxysmal atrial fibrillation, aortic valve replacement/Bentall procedure in 2004, chronic diastolic heart failure, hypertension, hyperlipidemia, and lung cancer.  She underwent aortic valve replacement/Bentall procedure at the time of her CABG in 2004.  Cardiac catheterization in 2018 revealed severe native CAD involving most of the LAD, occluded SVG-PDA along with sequential SVG-PDA/PL.  SVG to diagonal 1 was also occluded.  Myoview in 2020 findings consistent with prior MI.  Follow-up cardiac catheterization in 11/2020 showed severe single-vessel CAD with extensive mid to distal LAD disease and distal occlusion at LIMA to LAD graft insertion with patent LIMA-LAD, occluded SVG-PDA/PL, occluded SVG-diagonal 1.  Cardiac monitor in 12/2020 showed predominantly sinus rhythm, first-degree AV block, NSVT, PACs/rare PACs and PVCs choledocholithiasis atrial fibrillation.  He was hospitalized in March 2024 at [facility] in [place] in the setting of acute on chronic heart failure.  Echocardiogram revealed EF 45, mildly decreased LV function, elevated LA pressure, moderate RVE/RAE, normally functioning aortic valve prosthesis, peak gradient 13 mmHg.  He was last seen in the office on 09/01/2022 and noted ongoing dyspnea, abdominal fullness.  He was advised to take Lasix 20 mg x 3 days,  monitor daily weights and bring log to next office appointment.  Repeat echo was ordered to be completed prior to follow-up with Dr. Herbie Baltimore in June 2024.  He presents today for follow-up.  Since his last visit has been stable from a cardiac standpoint.  He took Lasix for 3 days and noticed a decrease in his weight.  However, he immediately started gaining weight again once he stopped his Lasix.  He has mild nonpitting bilateral lower extremity edema, stable chronic dyspnea.  He denies chest pain. He has had EKG tracings on his [employer] mobile that have read possible atrial fibrillation.  He took amiodarone 200 mg daily for the past 3 days.  Denies any palpitations, dizziness, presyncope, syncope.   Home Medications    Current Outpatient Medications  Medication Sig Dispense Refill   amiodarone (PACERONE) 200  MG tablet Take 400 mg (2 x 200 mg) twice daily for 5 days; if not converted then 200 mg a day for 5 more day as needed for atrial fibrillation 30 tablet 6   ampicillin (PRINCIPEN) 500 MG capsule Take 500 mg by mouth every evening.     betamethasone dipropionate (DIPROLENE) 0.05 % ointment Apply 1 application topically 2 (two) times daily as needed (psoriasis).     Cyanocobalamin (RA VITAMIN B12) 2000 MCG TBCR Take 2,000 mcg by mouth in the morning.     DENTA 5000 PLUS 1.1 % CREA dental cream Place 1 application onto teeth at bedtime.  6   enoxaparin (LOVENOX) 100 MG/ML injection Inject 1 mL (100 mg total) into the skin every 12 (twelve) hours. 20 mL 1   esomeprazole (NEXIUM) 20 MG capsule Take 20 mg by mouth in the morning.     ferrous sulfate 325 (65 FE) MG tablet Take 325 mg by mouth every evening.     finasteride (PROSCAR) 5 MG tablet Take 5 mg by mouth daily.     furosemide (LASIX) 20 MG tablet Take 1 tablet (20 mg total) by mouth as needed. 20 tablet 2   LORazepam (ATIVAN) 0.5 MG tablet Take 0.5 mg by mouth in the morning, at noon, and at bedtime.     metoprolol succinate (TOPROL-XL) 25 MG  24 hr tablet TAKE 1 TABLET EVERY DAY 90 tablet 3   metroNIDAZOLE (METROGEL) 1 % gel Apply 1 application topically daily as needed (roasacea).     nitroGLYCERIN (NITROSTAT) 0.4 MG SL tablet Place 1 tablet (0.4 mg total) under the tongue every 5 (five) minutes as needed for chest pain. 25 tablet 3   potassium chloride SA (KLOR-CON M) 20 MEQ tablet Take 1 tablet (20 mEq total) by mouth daily. 90 tablet 3   ranolazine (RANEXA) 500 MG 12 hr tablet TAKE 1 TABLET TWICE DAILY 180 tablet 3   rosuvastatin (CRESTOR) 20 MG tablet TAKE 1 TABLET EVERY DAY 90 tablet 3   tamsulosin (FLOMAX) 0.4 MG CAPS capsule Take 0.8 mg by mouth at bedtime.     Tiotropium Bromide-Olodaterol (STIOLTO RESPIMAT) 2.5-2.5 MCG/ACT AERS Inhale 2 puffs into the lungs in the morning.     valsartan (DIOVAN) 160 MG tablet Take 1 tablet (160 mg total) by mouth daily. 90 tablet 3   warfarin (COUMADIN) 5 MG tablet TAKE 1 AND 1/2 TABLETS TO 2 TABLETS DAILY AS DIRECTED BY COUMADIN CLINIC 180 tablet 1   No current facility-administered medications for this visit.     Review of Systems    He denies chest pain, palpitations, pnd, orthopnea, n, v, dizziness, syncope, or early satiety. All other systems reviewed and are otherwise negative except as noted above.   Physical Exam    VS:  BP (!) 126/52   Pulse 73   Ht 5\' 9"  (1.753 m)   Wt 182 lb 6.4 oz (82.7 kg)   BMI 26.94 kg/m   GEN: Well nourished, well developed, in no acute distress. HEENT: normal. Neck: Supple, no JVD, carotid bruits, or masses. Cardiac: RRR, aortic click, no rubs, or gallops. No clubbing, cyanosis, mild nonpitting bilateral lower extremity edema.  Radials/DP/PT 2+ and equal bilaterally.  Respiratory:  Respirations regular and unlabored, clear to auscultation bilaterally. GI: Soft, nontender, nondistended, BS + x 4. MS: no deformity or atrophy. Skin: warm and dry, no rash. Neuro:  Strength and sensation are intact. Psych: Normal affect.  Accessory Clinical  Findings    ECG personally reviewed  by me today -sinus rhythm, 73 bpm, first-degree AV block- no acute changes.   Lab Results  Component Value Date   WBC 1.5 (LL) 04/20/2022   HGB 11.3 (L) 04/20/2022   HCT 32.6 (L) 04/20/2022   MCV 103 (H) 04/20/2022   PLT 143 (L) 04/20/2022   Lab Results  Component Value Date   CREATININE 0.80 09/01/2022   BUN 14 09/01/2022   NA 134 09/01/2022   K 5.4 (H) 09/01/2022   CL 99 09/01/2022   CO2 24 09/01/2022   Lab Results  Component Value Date   ALT 17 11/05/2020   AST 19 11/05/2020   ALKPHOS 59 11/05/2020   BILITOT 0.8 11/05/2020   Lab Results  Component Value Date   CHOL 132 11/11/2020   HDL 65 11/11/2020   LDLCALC 54 11/11/2020   TRIG 65 11/11/2020   CHOLHDL 2.0 11/11/2020    No results found for: "HGBA1C"  Assessment & Plan   1. HFmrEF: Most recent echo in 07/2022 revealed EF 45, mildly decreased LV function, elevated LA pressure, moderate RVE/RAE, normally functioning aortic valve prosthesis, peak gradient 13 mmHg, mean gradient 8 mmHg.  He has noticed a recent lower extremity edema, weight gain.  He did notice a decrease in his weight with increased Lasix dosing, however, upon reducing his dose he immediately started gaining weight again.  BNP was elevated at last office visit.  Will increase Lasix to 40 mg daily x 3 days followed by Lasix 20 mg daily.  Will check BMET, BNP today.  Will repeat BMET in 2 weeks.  Discussed ongoing monitoring with daily weights, sodium and fluid recommendations.  Will cancel repeat echo as he just had echo completed in 07/2022.  Continue metoprolol, valsartan.  2. CAD: S/p CABG x 4 in 2004. Cath in 2022 showed severe single-vessel CAD with extensive mid to distal LAD disease and distal occlusion at LIMA to LAD graft insertion with patent LIMA-LAD, occluded SVG-PDA/PL, occluded SVG-diagonal 1. Stable with no anginal symptoms. No indication for ischemic evaluation.  No ASA in the setting of chronic  anticoagulation.  Continue metoprolol, valsartan, Ranexa, and Crestor.  3. Paroxysmal atrial fibrillation: Maintaining sinus rhythm.  He has had several EKG tracings on his [employer] mobile device in the past few days that have indicated "possible atrial fibrillation."  Upon review there is no clear evidence of atrial fibrillation, however, difficult to discern given significant artifact on tracings.  He has taken his as needed amiodarone 3 days in a row.  He denies any palpitations, dyspnea.  Continue amiodarone, warfarin.  4. Aortic valve replacement/Bentall procedure: Most recent echo in 07/2022 showed normally functioning aortic valve prosthesis, mean gradient 8 mmHg.  Continue SBE prophylaxis.  5. Hypertension: BP well controlled. Continue current antihypertensive regimen.   6. Hyperlipidemia: LDL was 40 in 01/2022.  Continue Crestor.  7. Disposition: Follow-up as scheduled with Dr. Herbie Baltimore in 10/2022.     Joylene Grapes, NP 09/08/2022, 9:44 AM

## 2022-09-08 NOTE — Patient Instructions (Addendum)
Medication Instructions:  Lasix (Furosemide) 40 mg for 3 days and resume 20 mg daily.   *If you need a refill on your cardiac medications before your next appointment, please call your pharmacy*   Lab Work: Your physician recommends that you complete lab work today. BMET & BNP today. BMET in 2 weeks  If you have labs (blood work) drawn today and your tests are completely normal, you will receive your results only by: MyChart Message (if you have MyChart) OR A paper copy in the mail If you have any lab test that is abnormal or we need to change your treatment, we will call you to review the results.   Testing/Procedures: NONE ordered at this time of appointment     Follow-Up: At [facility], you and your health needs are our priority.  As part of our continuing mission to provide you with exceptional heart care, we have created designated Provider Care Teams.  These Care Teams include your primary Cardiologist (physician) and Advanced Practice Providers (APPs -  Physician Assistants and Nurse Practitioners) who all work together to provide you with the care you need, when you need it.  We recommend signing up for the patient portal called "MyChart".  Sign up information is provided on this After Visit Summary.  MyChart is used to connect with patients for Virtual Visits (Telemedicine).  Patients are able to view lab/test results, encounter notes, upcoming appointments, etc.  Non-urgent messages can be sent to your provider as well.   To learn more about what you can do with MyChart, go to [email or web address].    Your next appointment:    Keep follow up   Provider:   Bryan Lemma, MD     Other Instructions Heart Failure Education:  Weigh yourself EVERY morning after you go to the bathroom but before you eat or drink anything. Write this number down in a weight log/diary. If you gain 3 pounds overnight or 5 pounds in a week, call the office. Take your medicines as  prescribed. If you have concerns about your medications, please call us before you stop taking them. Eat low salt foods--Limit salt (sodium) to 2000 mg per day. This will help prevent your body from holding onto fluid. Read food labels as many processed foods have a lot of sodium, especially canned goods and prepackaged meats. If you would like some assistance choosing low sodium foods, we would be happy to set you up with a nutritionist. Limit all fluids for the day to less than 2 liters (64 ounces). Fluid includes all drinks, coffee, juice, ice chips, soup, jello, and all other liquids. Stay as active as you can everyday. Staying active will give you more energy and make your muscles stronger. Start with 5 minutes at a time and work your way up to 30 minutes a day. Break up your activities--do some in the morning and some in the afternoon. Start with 3 days per week and work your way up to 5 days as you can.  If you have chest pain, feel short of breath, dizzy, or lightheaded, STOP. If you don't feel better after a short rest, call 911. If you do feel better, call the office to let us know you have symptoms with exercise.

## 2022-09-09 LAB — BASIC METABOLIC PANEL
BUN/Creatinine Ratio: 15 (ref 10–24)
BUN: 12 mg/dL (ref 8–27)
Calcium: 8.6 mg/dL (ref 8.6–10.2)
Creatinine, Ser: 0.82 mg/dL (ref 0.76–1.27)
Glucose: 94 mg/dL (ref 70–99)
Potassium: 4.9 mmol/L (ref 3.5–5.2)
Sodium: 133 mmol/L — ABNORMAL LOW (ref 134–144)

## 2022-09-10 NOTE — Telephone Encounter (Signed)
I suspect that short bursts of atrial fibrillation may have led to the increased weight/volume retention from diastolic dysfunction/heart failure.  Bumping up the diuretic dose to get weight down based on sliding scale is the right and plan.  X-ray amiodarone if it breaks after 1 dose of amiodarone, I would probably take it for couple days after conversion just to maintain conversion and then if it is staying stable, and then stay off of it for full time.  If this happens more frequently than not, we may go back onto it long-term.  Bryan Lemma, MD

## 2022-09-13 ENCOUNTER — Ambulatory Visit (INDEPENDENT_AMBULATORY_CARE_PROVIDER_SITE_OTHER): Payer: Medicare HMO | Admitting: Cardiology

## 2022-09-13 DIAGNOSIS — Z5181 Encounter for therapeutic drug level monitoring: Secondary | ICD-10-CM | POA: Diagnosis not present

## 2022-09-13 DIAGNOSIS — I480 Paroxysmal atrial fibrillation: Secondary | ICD-10-CM | POA: Diagnosis not present

## 2022-09-13 LAB — POCT INR: INR: 2.4 (ref 2.0–3.0)

## 2022-09-13 NOTE — Patient Instructions (Signed)
Description   SELF TESTER: Spoke with pt, advised pt to take 1.5 tablets of warfarin today and then continue to take warfarin 1 tablet daily except for 1.5 tablets on Monday, Wednesday and Friday. Recheck INR in 2 weeks. Coumadin Clinic [phone].

## 2022-09-14 NOTE — Telephone Encounter (Signed)
Called patient and informed patient of recommendation from [facility] NP.  Patient  verbalized understanding.

## 2022-09-15 ENCOUNTER — Telehealth: Payer: Self-pay

## 2022-09-15 NOTE — Telephone Encounter (Signed)
Spoke with pt. Pt was notified of lab results. Pt will continue to take Lasix 20 mg daily and is aware that he can add an additional 20 mg on the days of increased weight gain or shortness of breath.

## 2022-09-23 LAB — BASIC METABOLIC PANEL
BUN/Creatinine Ratio: 15 (ref 10–24)
BUN: 14 mg/dL (ref 8–27)
CO2: 24 mmol/L (ref 20–29)
Calcium: 8.5 mg/dL — ABNORMAL LOW (ref 8.6–10.2)
Chloride: 88 mmol/L — ABNORMAL LOW (ref 96–106)
Creatinine, Ser: 0.91 mg/dL (ref 0.76–1.27)
Glucose: 87 mg/dL (ref 70–99)
Potassium: 4.5 mmol/L (ref 3.5–5.2)
Sodium: 125 mmol/L — ABNORMAL LOW (ref 134–144)
eGFR: 85 mL/min/{1.73_m2} (ref >59)

## 2022-09-25 ENCOUNTER — Other Ambulatory Visit: Payer: Self-pay | Admitting: Cardiology

## 2022-09-27 ENCOUNTER — Ambulatory Visit (INDEPENDENT_AMBULATORY_CARE_PROVIDER_SITE_OTHER): Payer: Medicare HMO | Admitting: *Deleted

## 2022-09-27 DIAGNOSIS — D6869 Other thrombophilia: Secondary | ICD-10-CM

## 2022-09-27 DIAGNOSIS — I480 Paroxysmal atrial fibrillation: Secondary | ICD-10-CM

## 2022-09-27 DIAGNOSIS — Z952 Presence of prosthetic heart valve: Secondary | ICD-10-CM

## 2022-09-27 LAB — POCT INR: INR: 2.9 (ref 2.0–3.0)

## 2022-09-27 NOTE — Patient Instructions (Signed)
Description   SELF TESTER: Spoke with pt, advised pt to continue to take warfarin 1 tablet daily except for 1.5 tablets on Monday, Wednesday and Friday. Recheck INR in 2 weeks. Coumadin Clinic [phone].

## 2022-10-06 ENCOUNTER — Other Ambulatory Visit: Payer: Self-pay | Admitting: Cardiology

## 2022-10-11 ENCOUNTER — Ambulatory Visit (INDEPENDENT_AMBULATORY_CARE_PROVIDER_SITE_OTHER): Payer: Medicare HMO | Admitting: Cardiology

## 2022-10-11 DIAGNOSIS — Z5181 Encounter for therapeutic drug level monitoring: Secondary | ICD-10-CM | POA: Diagnosis not present

## 2022-10-11 DIAGNOSIS — Z952 Presence of prosthetic heart valve: Secondary | ICD-10-CM | POA: Diagnosis not present

## 2022-10-11 LAB — POCT INR: INR: 2.5 (ref 2.0–3.0)

## 2022-10-11 NOTE — Patient Instructions (Signed)
Description   SELF TESTER: Spoke with pt, advised pt to continue to take warfarin 1 tablet daily except for 1.5 tablets on Monday, Wednesday and Friday. Recheck INR in 2 weeks. Coumadin Clinic [phone].       

## 2022-10-12 ENCOUNTER — Other Ambulatory Visit (HOSPITAL_COMMUNITY): Payer: Medicare HMO

## 2022-10-25 ENCOUNTER — Ambulatory Visit (INDEPENDENT_AMBULATORY_CARE_PROVIDER_SITE_OTHER): Payer: Medicare HMO

## 2022-10-25 DIAGNOSIS — Z5181 Encounter for therapeutic drug level monitoring: Secondary | ICD-10-CM | POA: Diagnosis not present

## 2022-10-25 DIAGNOSIS — Z952 Presence of prosthetic heart valve: Secondary | ICD-10-CM | POA: Diagnosis not present

## 2022-10-25 DIAGNOSIS — D6869 Other thrombophilia: Secondary | ICD-10-CM

## 2022-10-25 DIAGNOSIS — I480 Paroxysmal atrial fibrillation: Secondary | ICD-10-CM | POA: Diagnosis not present

## 2022-10-25 LAB — POCT INR: INR: 2.5 (ref 2.0–3.0)

## 2022-10-25 NOTE — Patient Instructions (Signed)
Description   SELF TESTER: Spoke with pt, advised pt to continue to take warfarin 1 tablet daily except for 1.5 tablets on Monday, Wednesday and Friday. Recheck INR in 2 weeks. Coumadin Clinic [phone].       

## 2022-10-25 NOTE — Progress Notes (Unsigned)
Primary Care Provider: Pearson Forster, MD  Also Referring Provider [facility] Cardiologist: Bryan Lemma, MD Electrophysiologist: None  Clinic Note: No chief complaint on file.   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 male with a PMH notable for multivessel CAD noted back in 2004 (CABG/AVR/aortoplasty => occluded sequential vein graft to the RCA system and SVG-D2), PAF, HTN, HLD, COPD and MGUS who presents today for routine follow-up.  2004: MV-CAD-> s/p CABG/AVR/aortoplasty in 2004 => complicated by by PAF (on warfarin for mechanical valve), cardiomyopathy and chronic fatigue  Cardiac Cath 04/11/2017: Native RCA ~patent to PAV/PL, Native LM-LCx(OM1 & OM2), LAD-D1 & D2 then 100% after SP2). Patent  LIMA-dLAD & occluded SeqSVG-RPDA-PL & SVG-D2.  Myoview 10/28/2020: Inferior and inferolateral anterior anterior apical infarct with peri-infarct ischemia -INTERMEDIATE RISK, similar to 2018 Cath 11/11/2020: Stable findings on Cath and Echo (EF 60 to 65%)  I last saw Erik Meyer back in April 2023 => was dealing with the diagnosis of MGUS and also lung nodules.  Not really having any more issues with discomfort.   Off-and-on chest pain but not that bad.  These episodes come and go in cycles.  He thought he may be having anxiety attacks.  Doing routine exercise at the [facility] 3 to 4 days a week getting his heart rate into the 100s.Marland Kitchen  However he would sometimes get dizzy and lightheaded walking out of the car after his workouts.  Sometimes losing his balance.  No syncope or near syncope.  Energy level noted will better but just not as much pep in the step.   Discussed breakthrough amiodarone for A-fib noted on [employer].  was last seen on ***  Recent Hospitalizations: ***  Reviewed  CV studies:    The following studies were reviewed today: (if available, images/films reviewed: From Epic Chart or Care Everywhere) ***:  Interval History:   Erik Meyer   CV Review of Symptoms (Summary): Cardiovascular ROS: {roscv:310661}  REVIEWED OF SYSTEMS   ROS  Positive for malaise/fatigue (Not as much pep in the step, but seems to be doing better recently.) and weight loss (He lost weight in the beginning part of the year, but not much recently.).  HENT:  Negative for congestion and nosebleeds.   Respiratory:  Positive for cough (Minimal coughing.) and shortness of breath. Negative for hemoptysis and wheezing.   Cardiovascular:        Per HPI.  Gastrointestinal:  Negative for abdominal pain, blood in stool, melena and vomiting.  Genitourinary:  Negative for frequency and hematuria.  Musculoskeletal:  Positive for back pain and joint pain. Negative for falls (No falls but he has come close because of hypotension issues.) and myalgias.  Neurological:  Positive for dizziness (Per HPI) and  weakness (Generalized weakness.  Just does not feel as strong as he used to feel.).  Endo/Heme/Allergies:  Bruises/bleeds easily (Better since he has cut down alcohol consumption.).  Psychiatric/Behavioral:  Positive for depression (Probably because he had to be caregiver for his wife who is actually doing worse and he is  with multiple falls and illnesses.  He just is not doing much in the way of any pleasurable activities.  Not sleeping well.  More fatigued.  Not eating as well.). Negative for memory loss. The patient is nervous/anxious and has insomnia.    I have reviewed and (if needed) personally updated the patient's problem list, medications, allergies, past medical and surgical history, social and family history.   PAST MEDICAL HISTORY   Past Medical History:  Diagnosis Date   Anemia    CAD, multiple vessel 12/2002   Found during preop cath for [facility] AVR procedure; Myoview 5/'13: No ischemia or infarct, EF 53% --> cath December 2018: Proximal LAD 65% followed by mid LAD 65% then 100% occlusion.  Patent LIMA-LAD.  SVG-D2 occluded.  RPDA occluded. Seq SVG-r PDA-PL patent to PDA, but the PDA is occluded as is the proximal native PDA; Similar Findings 11/2020   COPD (chronic obstructive pulmonary disease) (HCC)    Emphysema noted on CT scan.   Diverticulosis    Dyslipidemia, goal LDL below 70    Monitored by PCP. On statin (last labs scan from September 2015: TC 149, TG 127, HDL 55, LDL 69)   ED (erectile dysfunction)    GERD (gastroesophageal reflux disease)    History of SBO (small bowel obstruction) 07/2013   Abdominal surgery with LOA   Hx of irritable bowel syndrome    Hypertension, essential    Lesion of right native kidney 08/20/2013   Abdominal US : 2.4 cm x 1.7 complex lesion in the upper pole the right kidney in March of 2012 concerning for RCC although enlarging hemorrhagic cyst could have this appearance as well.,    Monoclonal gammopathy of undetermined significance    Paroxysmal-persistent atrial fibrillation (HCC); CHA2DS2-VASc Score 7 03/23/2017   CHA2DS2-VASc (CHF, HTN, CAD, agex2, TIAx2 = 7)-> Most recent episode January 3-5, 2022; unsuccessful cardioversion with amiodarone; -> went to ER-synchronized DCCV x1   Psoriasis    S/P AVR (aortic valve replacement) and aortoplasty 12/2002    Dr. Laneta SimmersZachary George procedure - St. Jude aVR (25 mm prosthesis) with aortic root conduit;; Echo 10/'14: EF 55-60%, Mod Conc LVH, Gr 1 DD, Well Seated AoV Mech Prosthesis with normal P gradients (no stenosis), Mod-Severe LA dilation --> follow-up echo November 2016: Well functioning mechanical valve. Paradoxical septal motion. EF 50-55%. Severe LA dilation. Moderate RA dilation.    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   LIMA-LAD, SVG-D1, SVG- PDA-PLA (along with AVR); SVG-Diag & Seq SVG-rPDA-PL both CTO (2018)    PAST SURGICAL HISTORY   Past Surgical History:  Procedure Laterality Date   AORTIC VALVE REPLACEMENT  12/26/2002   CG mechanical mechanical AVR as part of Bentall procedure   BENTALL PROCEDURE  12/26/2002   Aortic valve replacement and replacement of aortic root aneurysm using a 25 mm St. JUDE mechanical valve conduit with reimplantation of the coronary arteries   CARDIAC CATHETERIZATION  12/20/2002   diffuse  coronary disease,prior to his BENTALL PROCEDURE   CORONARY ARTERY BYPASS GRAFT  12/26/2002   LIMA to LAD,SVG to diagonal, SVG to PDA and PLA.    CORONARY/GRAFT ANGIOGRAPHY N/A 04/21/2017   Procedure: CORONARY/GRAFT ANGIOGRAPHY;  Surgeon: Herbie Baltimore,  Piedad Climes, MD;  Location: [facility] INVASIVE CV LAB;  Service: Cardiovascular; Native RCA ~patent to PAV/PL, Native LM-LCx(OM1 & OM2), LAD-D1 & D2 then 100% after SP2). Patent LIMA-dLAD, occluded SeqSVG-RPDA-PL & SVG-D2. --. FINDINGS CORELATED WELL WITH PERFUSION DEFECT SEEN ON MYOVIEW - INFERIOR INFARCT & ANTERIOR INFARCT. --Med Rx   CT-Guided Lung Biopsy  07/22/2021   [facility]   EXERCISE TOLERANCE TEST  09/12/2017    5 METS Baseline ST depression.  No new changes with exercise.  Heart rate went to 141 bpm.  Poor exercise tolerance reaching target heart rate at 2:26-minute,.  No chronotropic Incompetence   LAPAROSCOPIC LYSIS OF ADHESIONS  07/2013   performed in [place] for SBO    LEFT HEART CATH AND CORONARY ANGIOGRAPHY N/A 11/11/2020    Procedure: LEFT HEART CATH AND CORONARY ANGIOGRAPHY;  Surgeon: Marykay Lex, MD;  Location: [facility] INVASIVE CV LAB;  Service: Cardiovascular;   pLAD 65%, mLAd 65% (stable) -> 100% CTO. Patent LIMA-dLAD. Ost rPDA 100% CTO with patent RPL system.. SVG-D2 100%. SVG-rPDA-PL occluded. Native LCx - ~normal.  STABLE FINDINGS (Still correlate with Myoview)   NM MYOVIEW LTD  10/04/2011   09/2011: showed no ischemia;EF52%; 03/2017 - [facility]) HIGH RISK.  Mild LV dilation.  EF 45 to 50%..  Concern for prior infarct in the inferior lateral wall and apical anterior wall consistent with infarct with peri-infarct ischemia. => On review, these findings correlate with cardiac catheter showing inferior infarct and anterior infarct.   NM MYOVIEW LTD  10/28/2020   Equivocal Findings;   EF ~45-50%. No EKG changes. Medium Size - Severe defect - basal inferolateral & mid inferolateral. Also Medium Size-Severe defect in mid Anterior, apical Anterior & Apex. C/w Prior MI with Peri-Infarct Ischemia. - INTERMEDIATE RISK - STABLE FROM 2018   PARTIAL COLECTOMY  1994   THORACIC AORTIC ANEURYSM REPAIR  2004   Saint Jude aortic valve conduit-Bentall AVR   TRANSTHORACIC ECHOCARDIOGRAM  06/21/2021   [facility]) EF 60 to 65%.  Mild diastolic dysfunction.  Normal LAP.  Moderate LA dilation.  Moderate RV dilation.  Moderate-severe RA dilation.  Very mild AI.   TRANSTHORACIC ECHOCARDIOGRAM  11/2018    Stable EF 45-50%-moderate HK of apical-anteroseptal wall..  Moderate asymmetric LVH with GR 1 DD.  Normal RV.  RA mildly dilated.  Moderate MAC.   TRANSTHORACIC ECHOCARDIOGRAM  11/11/2020   EF 45 to 50%.  Mildly decreased function.  Inferolateral hypokinesis and small area of anteroseptal wall motion abnormality.  RV is borderline size with moderate dysfunction-normal CVP.Marland Kitchen  Both atria are moderately dilated.  25 mm bileaflet mechanical aortic valve in place.  Normal function and normal gradients..   VENTRAL HERNIA REPAIR      repair with mesh by Dr Loreta Ave at [facility]    L Heart Cath 11/11/2020: pLAD 65%, mLAd 65% (stable) -> 100% CTO. Patent LIMA-dLAD. Ost rPDA 100% CTO with patent RPL system.. SVG-D2 100%. SVG-rPDA-PL occluded. Native LCx - ~normal.  STABLE FINDINGS       MEDICATIONS/ALLERGIES   No outpatient medications have been marked as taking for the 10/26/22 encounter (Appointment) with Marykay Lex, MD.    Allergies  Allergen Reactions   Duloxetine     Other Reaction(s): Afib w/ RVR   Doxycycline Rash    SOCIAL HISTORY/FAMILY HISTORY   Reviewed in Epic:  Pertinent findings: Former smoker.  Quit in 1999.  Occasional social alcohol.  Social History   Social History Narrative   Married gentleman with no children.  Quit smoking in 1998.   Education: 2 years of college. He does drink caffeine.  He drinks social beer.   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 Tresa Endo in [place]; GI: Dr. Quinn Axe in [place]      **November 2020: He notes increased stress family to care for his wife who is showing signs of worsening dementia.  Hard to adjust to this and is somewhat depressing.    -He is try to work on cutting back on his intake of salt specific    OBJCTIVE -PE, EKG, labs   Wt Readings from Last 3 Encounters:  09/08/22 182 lb 6.4 oz (82.7 kg)  09/01/22 181 lb 6.4 oz (82.3 kg)  04/20/22 191 lb (86.6 kg)    Physical Exam: There were no vitals taken for this visit. Physical Exam Vitals reviewed.  Constitutional:      General: He is not in acute distress.    Appearance: He is not ill-appearing.     Comments: Moderately obese; well-nourished well-groomed.  Healthy-appearing.  HENT:     Head: Normocephalic and atraumatic.  Cardiovascular:     Heart sounds: S1 normal and S2 normal. Murmur (Harsh 2/6 SEM at RUSB.) heard.      No friction rub. No gallop.  Pulmonary:     Effort: Pulmonary effort is normal. No respiratory distress.     Breath sounds: Normal breath sounds. No wheezing, rhonchi or rales.  Musculoskeletal:        General: Swelling present.  Skin:    General: Skin is warm and dry.  Neurological:     General: No focal deficit present.     Mental Status: He is alert and oriented to person, place, and time.     Gait: Gait normal.  Psychiatric:        Mood and Affect: Mood normal.        Behavior: Behavior normal.        Thought Content: Thought content normal.        Judgment: Judgment normal.     Adult ECG Report  Rate: *** ;  Rhythm: {rhythm:17366};   Narrative Interpretation: ***  Recent Labs:  ***  Lab Results  Component Value Date   CHOL 132 11/11/2020   HDL 65 11/11/2020   LDLCALC 54 11/11/2020   TRIG 65 11/11/2020   CHOLHDL 2.0 11/11/2020   Lab Results  Component Value Date   CREATININE 0.91 09/22/2022   BUN 14 09/22/2022   NA 125 (L) 09/22/2022   K 4.5 09/22/2022   CL 88 (L) 09/22/2022   CO2 24 09/22/2022      Latest Ref Rng & Units 04/20/2022    9:18 AM 11/12/2020    4:52 AM 11/11/2020    3:19 AM  CBC  WBC 3.4 - 10.8 x10E3/uL 1.5  1.6  1.8   Hemoglobin 13.0 - 17.7 g/dL 16.1  09.6  04.5   Hematocrit 37.5 - 51.0 % 32.6  31.7  30.2   Platelets 150 - 450 x10E3/uL 143  120  120     No results found for: "HGBA1C" No results found for: "TSH"  ================================================== I spent a total of ***minutes with the patient spent in direct patient consultation.  Additional time spent with chart review  / charting (studies, outside notes, etc): *** min Total Time: ***  min  Current medicines are reviewed at length with the patient today.  (+/- concerns) ***  Notice: This dictation was prepared with Dragon dictation along with smart phrase technology. Any transcriptional errors that result from this process are unintentional and may not be corrected upon  review.  Studies Ordered:   No orders of the defined types were placed in this encounter.  No orders of the defined types were placed in this encounter.   Patient Instructions / Medication Changes & Studies & Tests Ordered   There are no Patient Instructions on file for this visit.     Marykay Lex, MD, MS Bryan Lemma, M.D., M.S. Interventional Cardiologist  [facility]  Pager # [phone] Phone # [phone] [address] [place], [place] [address]   Thank you for choosing [facility] [place]!!

## 2022-10-26 ENCOUNTER — Ambulatory Visit: Payer: Medicare HMO | Attending: Cardiology | Admitting: Cardiology

## 2022-10-26 ENCOUNTER — Encounter: Payer: Self-pay | Admitting: Cardiology

## 2022-10-26 VITALS — BP 138/62 | HR 66 | Ht 69.0 in | Wt 179.2 lb

## 2022-10-26 DIAGNOSIS — Z952 Presence of prosthetic heart valve: Secondary | ICD-10-CM

## 2022-10-26 DIAGNOSIS — I25709 Atherosclerosis of coronary artery bypass graft(s), unspecified, with unspecified angina pectoris: Secondary | ICD-10-CM

## 2022-10-26 DIAGNOSIS — Z951 Presence of aortocoronary bypass graft: Secondary | ICD-10-CM

## 2022-10-26 DIAGNOSIS — I10 Essential (primary) hypertension: Secondary | ICD-10-CM | POA: Diagnosis not present

## 2022-10-26 DIAGNOSIS — I25119 Atherosclerotic heart disease of native coronary artery with unspecified angina pectoris: Secondary | ICD-10-CM | POA: Diagnosis not present

## 2022-10-26 DIAGNOSIS — E785 Hyperlipidemia, unspecified: Secondary | ICD-10-CM

## 2022-10-26 DIAGNOSIS — I5032 Chronic diastolic (congestive) heart failure: Secondary | ICD-10-CM | POA: Diagnosis not present

## 2022-10-26 DIAGNOSIS — D6869 Other thrombophilia: Secondary | ICD-10-CM

## 2022-10-26 DIAGNOSIS — I480 Paroxysmal atrial fibrillation: Secondary | ICD-10-CM | POA: Diagnosis not present

## 2022-10-26 DIAGNOSIS — I872 Venous insufficiency (chronic) (peripheral): Secondary | ICD-10-CM

## 2022-10-26 DIAGNOSIS — R5383 Other fatigue: Secondary | ICD-10-CM

## 2022-10-26 DIAGNOSIS — I4589 Other specified conduction disorders: Secondary | ICD-10-CM

## 2022-10-26 MED ORDER — ENTRESTO 24-26 MG PO TABS: 1.0000 | ORAL_TABLET | Freq: Two times a day (BID) | ORAL | 3 refills | Status: DC

## 2022-10-26 MED ORDER — METOPROLOL SUCCINATE ER 25 MG PO TB24: 12.5000 mg | ORAL_TABLET | Freq: Every day | ORAL | 3 refills | Status: DC

## 2022-10-26 NOTE — Assessment & Plan Note (Signed)
CHA2DS2-VASc score is high-at least 7.  Remains on warfarin for combination of atrial fibrillation CVA prophylaxis as well as prosthetic valve.  Unfortunately with 2 indications for being on anticoagulation, my recommendation would be that in the future he does undergo bridging prior to stopping warfarin for procedures or surgeries.

## 2022-10-26 NOTE — Assessment & Plan Note (Signed)
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  States he has now had a hospitalization for heart failure, not unreasonable considering switching from valsartan to [place]. He is now only taking Lasix as needed maybe once or twice a month.  Volume status seems to be stable. Still having fatigue so would like to reduce his beta-blocker dose.  Plan: Decrease Toprol to 12.5 mg daily. Convert from ARB to ARNI-will start with Entresto 24-26 mg twice daily with plans to follow-up with APP Bernadene Person, NP) in roughly 6 weeks to titrate further. Check BMP in 2 weeks If BP stable, would titrate up to medium dose at next APP follow-up My understanding was that he was on SGLT2 inhibitor, but I do not see 1 listed. ->  Consider Farxiga or Jardiance and follow-up. Continue PRN Lasix as needed for weight gain or edema more than 3 pounds a day. Depending on his diuretic usage, may want to consider spironolactone at next visit.

## 2022-10-26 NOTE — Assessment & Plan Note (Signed)
Unsure if fatigue is complicated by his chemotherapy and radiation for lung cancer, but from a cardiac standpoint we can back down on his beta-blocker last some increased chronotropic competence.  No longer on amiodarone.

## 2022-10-26 NOTE — Assessment & Plan Note (Signed)
Would probably not check another stress test unless symptoms warranted.  The last couple tests have not been all that helpful. He actually is not complaining of any further chest discomfort since I saw him.    Plan: Not on aspirin or Plavix because of warfarin. On stable dose of rosuvastatin 20 mg daily-excellent lipid control. Will continue Toprol, but at reduced dose of 12.5 mg daily Convert from losartan to [place] as indicated

## 2022-10-26 NOTE — Assessment & Plan Note (Signed)
Stable on recent echo. Continue warfarin for stroke prophylaxis. SBE prophylaxis-has ampicillin prescription pending.

## 2022-10-26 NOTE — Assessment & Plan Note (Signed)
His BP is actually a little bit borderline high today 138/62.  Should be on tolerated conversion to Entresto.  Will have him seen Bernadene Person, NP in short order to potentially titrate up further.  Plan is to convert from valsartan 160 to Entresto 24/26 milligram twice daily He had previously been on amlodipine as no longer listed since his hospital stay.  Only otherwise on low-dose Toprol.  We are reducing that dose.

## 2022-10-26 NOTE — Patient Instructions (Addendum)
Medication Instructions:    Decrease Toprol to 12.5 mg  ( 1/2 tablet of  Metoprolol succinate )  daily   Stop taking Valsartan 160 mg Start taking  Entresto 24/26 mg  take twice daily   *If you need a refill on your cardiac medications before your next appointment, please call your pharmacy*   Lab Work: BMP- 2 weeks  ( 11/07/22)    If you have labs (blood work) drawn today and your tests are completely normal, you will receive your results only by: MyChart Message (if you have MyChart) OR A paper copy in the mail If you have any lab test that is abnormal or we need to change your treatment, we will call you to review the results.   Testing/Procedures: Not needed   Follow-Up: At [facility], you and your health needs are our priority.  As part of our continuing mission to provide you with exceptional heart care, we have created designated Provider Care Teams.  These Care Teams include your primary Cardiologist (physician) and Advanced Practice Providers (APPs -  Physician Assistants and Nurse Practitioners) who all work together to provide you with the care you need, when you need it.     Your next appointment:   6 week(s)  The format for your next appointment:   In Person  Provider:   Bernadene Person, NP    Then, Bryan Lemma, MD will plan to see you again in 4 month(s).

## 2022-10-26 NOTE — Assessment & Plan Note (Signed)
Continued with decreased Toprol dose down to 12.5 mg daily.  Hopefully this will help with fatigue.

## 2022-10-26 NOTE — Assessment & Plan Note (Signed)
Excellent lipid control on 20 mg rosuvastatin.

## 2022-10-26 NOTE — Assessment & Plan Note (Signed)
Last cath was in July 2022 and found to have only 1 remaining graft patent which is LIMA to LAD.  In the absence of any further symptoms we will hold off on further evaluation.  Both SeqSVG-PDA-PL and SVG-diag are occluded  With LIMA be millimeter graft that is necessary to continue to evaluate with routine surveillance testing especially since they are somewhat equivocal.  Would only test for symptoms.

## 2022-10-26 NOTE — Assessment & Plan Note (Signed)
Not really having angina symptoms now.  He is pretty well-controlled on low-dose Toprol and Ranexa.  The 2 grafts are closed or actually probably indicative of the fact that either LAD, the PDA was not sufficient enough of a target, or be that the PL system and the diagonal had relatively decent antegrade flow.  Only graft patent is LIMA to LAD which should not cause down based on occluded LAD.

## 2022-10-26 NOTE — Assessment & Plan Note (Signed)
Swelling was pretty good continue to recommend foot elevation and compression stockings if possible.  Also recommend foot at lower leg exercises to help flow Walk when able. Elevate feet .  Lasix for worsening swelling

## 2022-10-27 ENCOUNTER — Encounter: Payer: Self-pay | Admitting: Cardiology

## 2022-11-03 MED ORDER — DAPAGLIFLOZIN PROPANEDIOL 10 MG PO TABS: 10.0000 mg | ORAL_TABLET | Freq: Every day | ORAL | 3 refills | Status: DC

## 2022-11-03 NOTE — Telephone Encounter (Signed)
Erik Meyer makes sense.  We can do Farxiga 10 mg daily 90 tablets with 3 refills.  He was then to simply continue his valsartan dose and not switch to [employer].

## 2022-11-04 ENCOUNTER — Telehealth (HOSPITAL_BASED_OUTPATIENT_CLINIC_OR_DEPARTMENT_OTHER): Payer: Self-pay | Admitting: Pharmacist Clinician (PhC)/ Clinical Pharmacy Specialist

## 2022-11-04 MED ORDER — DAPAGLIFLOZIN PROPANEDIOL 10 MG PO TABS: 10.0000 mg | ORAL_TABLET | Freq: Every day | ORAL | 0 refills | Status: DC

## 2022-11-04 NOTE — Telephone Encounter (Signed)
Pharmacy please advise on holding Warfarin prior to U/S needle Bx of Lymph node scheduled for 11/15/2022. Thank you.

## 2022-11-04 NOTE — Telephone Encounter (Signed)
   Pre-operative Risk Assessment    Patient Name: Erik Meyer  DOB: 04-01-1941 MRN: [id number]      Request for Surgical Clearance    Procedure:  ultrasound guided needle biopsy of supraclavicular lymph node  Date of Surgery:  Clearance 11/15/22                                 Surgeon:  [facility] Group or Practice Name:   Phone number:  [phone] Fax number:  [phone]   Type of Clearance Requested:   - Pharmacy:  Hold Warfarin (Coumadin) INR to be < 2   Type of Anesthesia:  Not Indicated   Additional requests/questions:    [name]   11/04/2022, 4:23 PM

## 2022-11-04 NOTE — Addendum Note (Signed)
Addended by: Candie Chroman on: 11/04/2022 09:22 AM   Modules accepted: Orders

## 2022-11-04 NOTE — Telephone Encounter (Signed)
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    Procedure: ultrasound guided needle biopsy of supraclavicular lymph node  Date of procedure: 11/15/22   CHA2DS2-VASc Score = 7   This indicates a 11.2% annual risk of stroke. The patient's score is based upon: CHF History: 1 HTN History: 1 Diabetes History: 0 Stroke History: 2 Vascular Disease History: 1 Age Score: 2 Gender Score: 0      CrCl 69 ml/min  He does have hx of TIA and also has a mechanical AV and afib which are all indications for bridging. However surgeon is asking for INR to be <2. Could hold for only 3 days and possibly not bridge. I will defer to Dr. Herbie Baltimore as to if a bridge is needed since a short hold could be had.  **This guidance is not considered finalized until pre-operative APP has relayed final recommendations.**

## 2022-11-05 NOTE — Telephone Encounter (Signed)
If he doesn't want to take it b/c of cost - then we will just cancel the Rx -- 90 d supply is less than 30 d.  It was not intended to be a trial med.   It is indicated for CV /CHF benefit.  Sorry to hear about the Cancer Dx -- maybe just keep things simple & go back to what he had been on - & forget about Entresto & Marcelline Deist -- not sure how a Medicare patient has such a $$ amount for these medications when they are Class I indications.  Not my intention to Rx meds that cost too much for anyone - but I have NO way of knowing who has what coverage.  Perhaps we need to have our Child psychotherapist tough base with him -- ? Maybe he could change his plan for next year to include better coverage.    DH.

## 2022-11-05 NOTE — Telephone Encounter (Signed)
Probably ok to hold x 3 days w/o bridge - but if > 3 d - would bridge with Lovenox  [facility]

## 2022-11-07 NOTE — Telephone Encounter (Signed)
   Patient Name: Erik Meyer  DOB: Jan 31, 1941 MRN: [id number]  Primary Cardiologist: Bryan Lemma, MD  Clinical pharmacists have reviewed the patient's past medical history, labs, and current medications as part of preoperative protocol coverage. The following recommendations have been made:   Per Dr. Herbie Baltimore patient is ok to hold x 3 days w/o bridge - but if > 3 d - would bridge with Lovenox    I will route this recommendation to the requesting party via Epic fax function and remove from pre-op pool.  Please call with questions.  Napoleon Form, Leodis Rains, NP 11/07/2022, 7:54 AM

## 2022-11-08 ENCOUNTER — Ambulatory Visit (INDEPENDENT_AMBULATORY_CARE_PROVIDER_SITE_OTHER): Payer: Medicare HMO | Admitting: *Deleted

## 2022-11-08 DIAGNOSIS — I480 Paroxysmal atrial fibrillation: Secondary | ICD-10-CM

## 2022-11-08 DIAGNOSIS — Z952 Presence of prosthetic heart valve: Secondary | ICD-10-CM | POA: Diagnosis not present

## 2022-11-08 DIAGNOSIS — D6869 Other thrombophilia: Secondary | ICD-10-CM

## 2022-11-08 LAB — POCT INR: INR: 3 (ref 2.0–3.0)

## 2022-11-10 NOTE — Telephone Encounter (Signed)
If no change made - I.e. Marcelline Deist or Entresto - No need for labs.  DH

## 2022-11-22 ENCOUNTER — Ambulatory Visit (INDEPENDENT_AMBULATORY_CARE_PROVIDER_SITE_OTHER): Payer: Medicare HMO | Admitting: Cardiology

## 2022-11-22 DIAGNOSIS — Z952 Presence of prosthetic heart valve: Secondary | ICD-10-CM | POA: Diagnosis not present

## 2022-11-22 DIAGNOSIS — Z5181 Encounter for therapeutic drug level monitoring: Secondary | ICD-10-CM | POA: Diagnosis not present

## 2022-11-22 LAB — POCT INR: INR: 2 (ref 2.0–3.0)

## 2022-12-06 ENCOUNTER — Ambulatory Visit (INDEPENDENT_AMBULATORY_CARE_PROVIDER_SITE_OTHER): Payer: Medicare HMO | Admitting: Cardiology

## 2022-12-06 ENCOUNTER — Telehealth: Payer: Self-pay

## 2022-12-06 DIAGNOSIS — Z952 Presence of prosthetic heart valve: Secondary | ICD-10-CM

## 2022-12-06 DIAGNOSIS — Z5181 Encounter for therapeutic drug level monitoring: Secondary | ICD-10-CM | POA: Diagnosis not present

## 2022-12-06 LAB — POCT INR: INR: 3.9 — AB (ref 2.0–3.0)

## 2022-12-06 NOTE — Telephone Encounter (Signed)
Reminded pt to check INR 

## 2022-12-12 ENCOUNTER — Encounter: Payer: Self-pay | Admitting: Nurse Practitioner

## 2022-12-12 ENCOUNTER — Ambulatory Visit: Payer: Medicare HMO | Attending: Nurse Practitioner | Admitting: Nurse Practitioner

## 2022-12-12 ENCOUNTER — Other Ambulatory Visit: Payer: Self-pay

## 2022-12-12 VITALS — BP 140/78 | HR 97 | Ht 69.0 in | Wt 179.0 lb

## 2022-12-12 DIAGNOSIS — Z952 Presence of prosthetic heart valve: Secondary | ICD-10-CM

## 2022-12-12 DIAGNOSIS — I5022 Chronic systolic (congestive) heart failure: Secondary | ICD-10-CM | POA: Diagnosis not present

## 2022-12-12 DIAGNOSIS — Z951 Presence of aortocoronary bypass graft: Secondary | ICD-10-CM

## 2022-12-12 DIAGNOSIS — I10 Essential (primary) hypertension: Secondary | ICD-10-CM

## 2022-12-12 DIAGNOSIS — I480 Paroxysmal atrial fibrillation: Secondary | ICD-10-CM

## 2022-12-12 DIAGNOSIS — E785 Hyperlipidemia, unspecified: Secondary | ICD-10-CM

## 2022-12-12 DIAGNOSIS — I2510 Atherosclerotic heart disease of native coronary artery without angina pectoris: Secondary | ICD-10-CM

## 2022-12-12 MED ORDER — AMIODARONE HCL 200 MG PO TABS: ORAL_TABLET | ORAL | 6 refills | Status: DC

## 2022-12-12 MED ORDER — SPIRONOLACTONE 25 MG PO TABS: 12.5000 mg | ORAL_TABLET | Freq: Every day | ORAL | 3 refills | Status: DC

## 2022-12-12 NOTE — Patient Instructions (Signed)
Medication Instructions:  Start Spironolactone 12.5 mg daily  *If you need a refill on your cardiac medications before your next appointment, please call your pharmacy*   Lab Work: BMET in 2 weeks   Testing/Procedures: NONE ordered at this time of appointment     Follow-Up: At [facility], you and your health needs are our priority.  As part of our continuing mission to provide you with exceptional heart care, we have created designated Provider Care Teams.  These Care Teams include your primary Cardiologist (physician) and Advanced Practice Providers (APPs -  Physician Assistants and Nurse Practitioners) who all work together to provide you with the care you need, when you need it.  We recommend signing up for the patient portal called "MyChart".  Sign up information is provided on this After Visit Summary.  MyChart is used to connect with patients for Virtual Visits (Telemedicine).  Patients are able to view lab/test results, encounter notes, upcoming appointments, etc.  Non-urgent messages can be sent to your provider as well.   To learn more about what you can do with MyChart, go to [email or web address].    Your next appointment:    Keep follow up appointment   Provider:   Bryan Lemma, MD

## 2022-12-12 NOTE — Progress Notes (Signed)
Office Visit    Patient Name: Erik Meyer Date of Encounter: 12/12/2022  Primary Care Provider:  Penelope Galas, MD Primary Cardiologist:  Bryan Lemma, MD  Chief Complaint    82 year old male with a history of CAD s/p CABG x 4 (LIMA-LAD, SVG-RPDA and PL, SVG-diagonal) in 2004, HFmrEF, paroxysmal atrial fibrillation, aortic valve replacement/Bentall procedure in 2004, hypertension, hyperlipidemia, and lung cancer who presents for follow-up related to CAD and heart failure.   Past Medical History    Past Medical History:  Diagnosis Date   Anemia    CAD, multiple vessel 12/2002   Found during preop cath for [facility] AVR procedure; Myoview 5/'13: No ischemia or infarct, EF 53% --> cath December 2018: Proximal LAD 65% followed by mid LAD 65% then 100% occlusion.  Patent LIMA-LAD.  SVG-D2 occluded.  RPDA occluded. Seq SVG-r PDA-PL patent to PDA, but the PDA is occluded as is the proximal native PDA; Similar Findings 11/2020   COPD (chronic obstructive pulmonary disease) (HCC)    Emphysema noted on CT scan.   Diverticulosis    Dyslipidemia, goal LDL below 70    Monitored by PCP. On statin (last labs scan from September 2015: TC 149, TG 127, HDL 55, LDL 69)   ED (erectile dysfunction)    GERD (gastroesophageal reflux disease)    History of SBO (small bowel obstruction) 07/2013   Abdominal surgery with LOA   Hx of irritable bowel syndrome    Hypertension, essential    Lesion of right native kidney 08/20/2013   Abdominal US : 2.4 cm x 1.7 complex lesion in the upper pole the right kidney in March of 2012 concerning for RCC although enlarging hemorrhagic cyst could have this appearance as well.,    Monoclonal gammopathy of undetermined significance    Paroxysmal-persistent atrial fibrillation (HCC); CHA2DS2-VASc Score 7 03/23/2017   CHA2DS2-VASc (CHF, HTN, CAD, agex2, TIAx2 = 7)-> Most recent episode January 3-5, 2022; unsuccessful cardioversion with amiodarone; -> went to  ER-synchronized DCCV x1   Psoriasis    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 Laneta SimmersZachary George procedure - St. Jude aVR (25 mm prosthesis) with aortic root conduit;; Echo 10/'14: EF 55-60%, Mod Conc LVH, Gr 1 DD, Well Seated AoV Mech Prosthesis with normal P gradients (no stenosis), Mod-Severe LA dilation --> follow-up echo November 2016: Well functioning mechanical valve. Paradoxical septal motion. EF 50-55%. Severe LA dilation. Moderate RA dilation.    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 JUDE mechanical valve conduit with reimplantation of the coronary arteries   CARDIAC CATHETERIZATION  12/20/2002   diffuse  coronary disease,prior to his BENTALL PROCEDURE   CORONARY ARTERY BYPASS GRAFT  12/26/2002   LIMA to LAD,SVG to diagonal, SVG to PDA and PLA.    CORONARY/GRAFT ANGIOGRAPHY N/A 04/21/2017   Procedure: CORONARY/GRAFT ANGIOGRAPHY;  Surgeon: Marykay Lex, MD;  Location: [facility] INVASIVE CV LAB;  Service: Cardiovascular; Native RCA ~patent to PAV/PL, Native LM-LCx(OM1 & OM2), LAD-D1 & D2 then 100% after SP2). Patent LIMA-dLAD, occluded SeqSVG-RPDA-PL & SVG-D2. --. FINDINGS CORELATED WELL WITH PERFUSION DEFECT SEEN ON MYOVIEW - INFERIOR INFARCT &  ANTERIOR INFARCT. --Med Rx   CT-Guided Lung Biopsy  07/22/2021   [facility]   EXERCISE TOLERANCE TEST  09/12/2017    5 METS Baseline ST depression.  No new changes with exercise.  Heart rate went to 141 bpm.  Poor exercise tolerance reaching target heart rate at 2:26-minute,.  No chronotropic Incompetence   LAPAROSCOPIC LYSIS OF ADHESIONS  07/2013   performed in [place]  for SBO    LEFT HEART CATH AND CORONARY ANGIOGRAPHY N/A 11/11/2020   Procedure: LEFT HEART CATH AND CORONARY ANGIOGRAPHY;  Surgeon: Marykay Lex, MD;  Location: [facility] INVASIVE CV LAB;  Service: Cardiovascular;   pLAD 65%, mLAd 65% (stable) -> 100% CTO. Patent LIMA-dLAD. Ost rPDA 100% CTO with patent RPL system.. SVG-D2 100%. SVG-rPDA-PL occluded. Native LCx - ~normal.  STABLE FINDINGS (Still correlate with Myoview)   NM MYOVIEW LTD  10/04/2011   09/2011: showed no ischemia;EF52%; 03/2017 - [facility]) HIGH RISK.  Mild LV dilation.  EF 45 to 50%..  Concern for prior infarct in the inferior lateral wall and apical anterior wall consistent with infarct with peri-infarct ischemia. => On review, these findings correlate with cardiac catheter showing inferior infarct and anterior infarct.   NM MYOVIEW LTD  10/28/2020   Equivocal Findings;   EF ~45-50%. No EKG changes. Medium Size - Severe defect - basal inferolateral & mid inferolateral. Also Medium Size-Severe defect in mid Anterior, apical Anterior & Apex. C/w Prior MI with Peri-Infarct Ischemia. - INTERMEDIATE RISK - STABLE FROM 2018   PARTIAL COLECTOMY  1994   THORACIC AORTIC ANEURYSM REPAIR  2004   Saint Jude aortic valve conduit-Bentall AVR   TRANSTHORACIC ECHOCARDIOGRAM  06/21/2021   [facility]) EF 60 to 65%.  Mild diastolic dysfunction.  Normal LAP.  Moderate LA dilation.  Moderate RV dilation.  Moderate-severe RA dilation.  Very mild AI.   TRANSTHORACIC ECHOCARDIOGRAM  11/2018    Stable EF 45-50%-moderate HK of apical-anteroseptal wall..  Moderate asymmetric LVH with GR 1 DD.  Normal RV.  RA mildly dilated.  Moderate MAC.   TRANSTHORACIC ECHOCARDIOGRAM  11/11/2020   EF 45 to 50%.  Mildly decreased function.  Inferolateral hypokinesis and small area of anteroseptal wall motion abnormality.  RV is borderline size with moderate dysfunction-normal CVP.Marland Kitchen  Both atria are moderately dilated.  25 mm bileaflet mechanical aortic valve in  place.  Normal function and normal gradients..   TRANSTHORACIC ECHOCARDIOGRAM  07/27/2022   [facility]: A)05/2022: Mildly reduced EF 45 to 50%.  Bioprosthetic? 25 mm Saint Jude AVR w/ normal structure and fxn.  Mod TR, mod-severe PulmHTN w/ RAP ~15 mmHg.  Severe biatrial enlargement;; b) 07/27/22:: Mild LVH.  EF 45 to 50%.  Abnormal strain.  Mild HK with abnormal septal motion.  Moderate dilated RV w/mod dysfxn.  BiAE-LA severe, RA mod.  Prosthetic AVR nl Fxn.  P-M gradient 13 & 8 mmHg.   VENTRAL HERNIA REPAIR     repair with mesh by Dr Loreta Ave at [facility]    Allergies  Allergies  Allergen Reactions   Duloxetine     Other Reaction(s): Afib w/ RVR   Doxycycline Rash     Labs/Other Studies Reviewed    The following studies were reviewed today:  Cardiac Studies & Procedures   CARDIAC CATHETERIZATION  CARDIAC CATHETERIZATION 11/11/2020  Narrative  Prox LAD lesion is 65% stenosed. Mid LAD-1 lesion is 65% stenosed. Stable in appearance from previous cath  Mid LAD-2 lesion is 100% stenosed -> at LIMA graft insertion site with  better flow  Ost RPDA lesion is 100% stenosed.  ---GRAFTS---  LIMA graft was visualized by angiography and is moderate in size with no notable disease.. There is competitive flow at the insertion site.  Seq SVG- rPDA-PL graft was not visualized due to known occlusion.  SVG-DIAG graft was visualized by angiography and is 100% stenosed @ the origin.  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  Large-caliber, patulous RCA with expansive PDA and PL covering a significant portion of the circumflex distribution.  Streaming flow noted, but no significant lesions.  Mechanical aortic valve not crossed.   RECOMMENDATIONS  Need to evaluate for other causes of chest pain-consider possible  pericarditis-he does mention that is worse with lying down.  2D echocardiogram pending.  Potentially consider colchicine and NSAIDs.  Otherwise continue GM DT    Bryan Lemma, MD  Findings Coronary Findings Diagnostic  Dominance: Right  Left Anterior Descending There is moderate diffuse disease throughout the vessel. After Diag2 - severe Prox LAD lesion is 65% stenosed. The lesion is eccentric. Mid LAD-1 lesion is 65% stenosed. The lesion is eccentric. Mid LAD-2 lesion is 100% stenosed. The lesion is chronically occluded.  First Diagonal Branch Vessel is moderate in size. The vessel exhibits minimal luminal irregularities.  First Septal Branch  Second Diagonal Branch Vessel is large in size. There is mild disease in the vessel.  Lateral Second Diagonal Branch Vessel is moderate in size.  Second Septal Branch  Third Diagonal Branch Vessel is small in size.  Right Coronary Artery Vessel was injected. Vessel is large. Very large-patulous vessel with streaming flow The vessel exhibits minimal luminal irregularities. Somewhat anterior takeoff with initial downward flow  Acute Marginal Branch Vessel is small in size.  Right Ventricular Branch Vessel is small in size.  Right Posterior Descending Artery Ost RPDA lesion is 100% stenosed. The lesion is chronically occluded.  Right Posterior Atrioventricular Artery Vessel is large in size.  Second Right Posterolateral Branch Vessel is moderate in size.  Third Right Posterolateral Branch Vessel is large in size.  Sequential Jump Graft Graft To RPDA, RPAV Seq SVG- rPDA-PL graft was not visualized due to known occlusion. Origin to Insertion lesion before RPDA  is 100% stenosed. The lesion is chronically occluded and heavily thrombotic.  Saphenous Graft To Ost 2nd Diag SVG graft was visualized by angiography and is large. Origin lesion is 100% stenosed. The lesion is chronically occluded.  LIMA LIMA Graft To Mid  LAD LIMA graft was visualized by angiography and is moderate in size. There is competitive flow. minimal Fills the small-moderate caliber distal LAD that does not reach the apex  Intervention  No interventions have been documented.   CARDIAC CATHETERIZATION  CARDIAC CATHETERIZATION 04/21/2017  Narrative Images from the original result were not included.   Prox LAD to Mid LAD lesion is 65% stenosed.  Mid LAD-1 lesion is 65% stenosed. Mid LAD-2 lesion is 100% stenosed. There is competitive flow.  LIMA-dLAD is moderate in size.  SVG-Diag2 graft was visualized by angiography and is large. Origin lesion is 100% stenosed.  Ost RPDA to RPDA lesion is 100% stenosed.  Seq SVG- rPDA-PL graft was visualized by angiography and is very large. Origin lesion before RPDA is 99% stenosed. Prox Graft to Insertion lesion before RPDA is 100% stenosed.  Severe native CAD involving mostly the LAD.  Unfortunately the  grafted PDA is now occluded along with the sequential SVG-PDA-PL. Not unexpectedly, the SVG-Diag1 is occluded. The findings correlate directly with the stress test findings of inferior infarct and anterior infarct. In the absence of ongoing anginal symptoms, I would not attempt to try PCI of the chronically occluded PDA.  The minimal segment of LAD compromising a septal perforator has not changed since bypass.  Likely not a major issue.  I suspect that the occluded grafts were from prior to 2013.  Nothing appears to be acute.   Plan:  Medical management including restarting amlodipine.  We will defer treatment of atrial fibrillation to elective physiology, he maintained sinus rhythm currently.  Restart warfarin tonight with Lovenox bridging per protocol.  Discharge home after bed rest    Bryan Lemma, M.D., M.S. Interventional Cardiologist  Pager # [phone] Phone # [phone] [address] [place], [place] [address]  Findings Coronary  Findings Diagnostic  Dominance: Right  Left Anterior Descending There is moderate diffuse disease throughout the vessel. After Diag2 - severe Prox LAD to Mid LAD lesion is 65% stenosed. The lesion is eccentric. Mid LAD-1 lesion is 65% stenosed. The lesion is eccentric. Mid LAD-2 lesion is 100% stenosed. The lesion is chronically occluded.  First Diagonal Branch Vessel is moderate in size. The vessel exhibits minimal luminal irregularities.  First Septal Branch Vessel is small in size.  Second Diagonal Branch Vessel is large in size. There is mild disease in the vessel.  Lateral Second Diagonal Branch Vessel is moderate in size.  Second Septal Branch  Third Diagonal Branch Vessel is small in size.  Right Coronary Artery  Acute Marginal Branch Vessel is moderate in size.  Right Posterior Descending Artery Ost RPDA to RPDA lesion is 100% stenosed. The lesion is chronically occluded. Previously Grafted  Right Posterior Atrioventricular Artery Vessel is large in size.  Second Right Posterolateral Branch Vessel is moderate in size.  Sequential Jump Graft Graft To RPDA, RPAV Seq SVG- rPDA-PL graft was visualized by angiography and is very large. The graft exhibits severe diffuse disease. The graft is severely ectatic. Origin lesion before RPDA  is 99% stenosed. The lesion is focal and concentric. Prox Graft to Insertion lesion before RPDA  is 100% stenosed. The lesion is chronically occluded and heavily thrombotic.  Saphenous Graft To Ost 2nd Diag SVG graft was visualized by angiography and is large. Origin lesion is 100% stenosed. The lesion is chronically occluded.  LIMA LIMA Graft To Mid LAD LIMA and is moderate in size. There is competitive flow. minimal Fills the small-moderate caliber distal LAD that does not reach the apex  Intervention  No interventions have been documented.   STRESS TESTS  MYOCARDIAL PERFUSION IMAGING 10/28/2020  Narrative  The left  ventricular ejection fraction is mildly decreased (45-54%).  Nuclear stress EF: 48%.  There was no ST segment deviation noted during stress.  No T wave inversion was noted during stress.  Defect 1: There is a medium defect of severe severity present in the basal inferolateral and mid inferolateral location.  Defect 2: There is a medium defect of moderate severity present in the mid anterior, apical anterior and apex location.  Findings consistent with prior myocardial infarction with peri-infarct ischemia.  This is an intermediate risk study.  Intermediate risk stress test, with EF mildly reduced and wall motion abnormalities as noted. There are two defects that are similar to prior; the inferolateral defect appears fixed and consistent with scar, and the anterior defect is consistent with  infarct with peri-infarct ischemia.   ECHOCARDIOGRAM  ECHOCARDIOGRAM COMPLETE 11/11/2020  Narrative ECHOCARDIOGRAM REPORT    Patient Name:   Erik Meyer Date of Exam: 11/11/2020 Medical Rec #:  [id number]         Height:       69.0 in Accession #:    [id number]        Weight:       205.1 lb Date of Birth:  19-Apr-1941          BSA:          2.088 m Patient Age:    80 years          BP:           117/67 mmHg Patient Gender: M                 HR:           78 bpm. Exam Location:  Inpatient  Procedure: 2D Echo, Cardiac Doppler and Color Doppler  Indications:    Acute myocardial infarction, unspecified I21.9  History:        Patient has prior history of Echocardiogram examinations, most recent 11/27/2018. Prior CABG, COPD, Arrythmias:Atrial Fibrillation; Risk Factors:Hypertension, Dyslipidemia and Former Smoker. GERD. Bentall procedure. Aortic Valve: 25 mm bileaflet valve is present in the aortic position. Procedure Date: 12/2002.  Sonographer:    Renella Cunas RDCS Referring Phys: [id number] CARRIEL T NIPP  IMPRESSIONS   1. Left ventricular ejection fraction, by estimation, is 45 to 50%.  The left ventricle has mildly decreased function. The left ventricle demonstrates regional wall motion abnormalities (see scoring diagram/findings for description). The left ventricular internal cavity size was mildly dilated. Left ventricular diastolic parameters are indeterminate. 2. Right ventricular systolic function is moderately reduced. The right ventricular size is moderately enlarged. There is normal pulmonary artery systolic pressure. 3. Left atrial size was moderately dilated. 4. Right atrial size was moderately dilated. 5. The mitral valve is normal in structure. Trivial mitral valve regurgitation. No evidence of mitral stenosis. Moderate mitral annular calcification. 6. The aortic valve has been repaired/replaced. Aortic valve regurgitation is not visualized. There is a 25 mm bileaflet valve present in the aortic position. Procedure Date: 12/2002. Echo findings are consistent with normal structure and function of the aortic valve prosthesis. Aortic valve mean gradient measures 6.0 mmHg. 7. The inferior vena cava is normal in size with greater than 50% respiratory variability, suggesting right atrial pressure of 3 mmHg.  Comparison(s): Prior images reviewed side by side.  Conclusion(s)/Recommendation(s): LVEF similar, with wall motion abnormalities predominantly inferolateral, but there is also a small area of the anteroseptum with focal WMA. On review, RV borderline enlarged on prior study, appears moderately enlarged on current study. Mechanical AVR without significant abnormalities.  FINDINGS Left Ventricle: Left ventricular ejection fraction, by estimation, is 45 to 50%. The left ventricle has mildly decreased function. The left ventricle demonstrates regional wall motion abnormalities. The left ventricular internal cavity size was mildly dilated. There is no left ventricular hypertrophy. Left ventricular diastolic parameters are indeterminate.   LV Wall Scoring: The mid and  distal lateral wall, posterior wall, mid anteroseptal segment, mid anterolateral segment, and apex are hypokinetic. The entire anterior wall, inferior septum, entire inferior wall, basal anteroseptal segment, and basal anterolateral segment are normal.  Right Ventricle: The right ventricular size is moderately enlarged. Right vetricular wall thickness was not well visualized. Right ventricular systolic function is moderately reduced. There is normal pulmonary artery systolic pressure. The tricuspid  regurgitant velocity is 2.35 m/s, and with an assumed right atrial pressure of 3 mmHg, the estimated right ventricular systolic pressure is 25.1 mmHg.  Left Atrium: Left atrial size was moderately dilated.  Right Atrium: Right atrial size was moderately dilated.  Pericardium: There is no evidence of pericardial effusion.  Mitral Valve: The mitral valve is normal in structure. There is mild thickening of the mitral valve leaflet(s). There is mild calcification of the mitral valve leaflet(s). Moderate mitral annular calcification. Trivial mitral valve regurgitation. No evidence of mitral valve stenosis.  Tricuspid Valve: The tricuspid valve is normal in structure. Tricuspid valve regurgitation is trivial. No evidence of tricuspid stenosis.  Aortic Valve: The aortic valve has been repaired/replaced. Aortic valve regurgitation is not visualized. Aortic valve mean gradient measures 6.0 mmHg. Aortic valve peak gradient measures 11.5 mmHg. Aortic valve area, by VTI measures 2.01 cm. There is a 25 mm bileaflet valve present in the aortic position. Procedure Date: 12/2002. Echo findings are consistent with normal structure and function of the aortic valve prosthesis.  Pulmonic Valve: The pulmonic valve was grossly normal. Pulmonic valve regurgitation is trivial. No evidence of pulmonic stenosis.  Aorta: The aortic root, ascending aorta, aortic arch and descending aorta are all structurally normal, with no  evidence of dilitation or obstruction.  Venous: The inferior vena cava is normal in size with greater than 50% respiratory variability, suggesting right atrial pressure of 3 mmHg.  IAS/Shunts: The atrial septum is grossly normal.   LEFT VENTRICLE PLAX 2D LVIDd:         5.20 cm LVIDs:         4.30 cm LV PW:         1.00 cm LV IVS:        1.10 cm LVOT diam:     2.50 cm LV SV:         65 LV SV Index:   31 LVOT Area:     4.91 cm  LV Volumes (MOD) LV vol d, MOD A2C: 120.0 ml LV vol d, MOD A4C: 118.0 ml LV vol s, MOD A2C: 43.3 ml LV vol s, MOD A4C: 54.2 ml LV SV MOD A2C:     76.7 ml LV SV MOD A4C:     118.0 ml LV SV MOD BP:      74.0 ml  RIGHT VENTRICLE RV S prime:     5.33 cm/s TAPSE (M-mode): 1.4 cm  LEFT ATRIUM           Index       RIGHT ATRIUM           Index LA diam:      6.70 cm 3.21 cm/m  RA Area:     24.50 cm LA Vol (A2C): 73.8 ml 35.34 ml/m RA Volume:   81.20 ml  38.88 ml/m LA Vol (A4C): 83.8 ml 40.13 ml/m AORTIC VALVE AV Area (Vmax):    1.73 cm AV Area (Vmean):   1.75 cm AV Area (VTI):     2.01 cm AV Vmax:           169.50 cm/s AV Vmean:          115.000 cm/s AV VTI:            0.323 m AV Peak Grad:      11.5 mmHg AV Mean Grad:      6.0 mmHg LVOT Vmax:         59.60 cm/s LVOT Vmean:  41.000 cm/s LVOT VTI:          0.132 m LVOT/AV VTI ratio: 0.41  AORTA Ao Asc diam:  3.00 cm Ao Desc diam: 2.70 cm  TRICUSPID VALVE TR Peak grad:   22.1 mmHg TR Vmax:        235.00 cm/s  SHUNTS Systemic VTI:  0.13 m Systemic Diam: 2.50 cm  Jodelle Red MD Electronically signed by Jodelle Red MD Signature Date/Time: 11/11/2020/11:35:10 AM    Final    MONITORS  LONG TERM MONITOR (3-14 DAYS) 12/15/2020  Narrative  Predominant rhythm is sinus with first-degree AV block. Minimum heart rate 55 bpm, maximum 139 bpm with an average of 74 bpm.  1 6 beat run of VT at a rate of 126 bpm.  859 bursts of PAT/SVT: Fastest was 4 beats at a  rate of 171 bpm. Longest was 4 minutes at a rate of 118 bpm.-No patient diary triggers.  Rare isolated PACs and PVCs within the more rare couplets and triplets. Short spells of ventricular bigeminy and trigeminy noted.  No obvious atrial fibrillation noted.   Patch Wear Time:  14 days and 0 hours (2022-07-22T15:05:05-398 to 2022-08-05T15:05:09-398)  Berry insulin monitor results with a lot of little bursts of premature atrial tachycardia that is probably a harbinger of A. fib.  Thankfully, there were no episodes longer than 4 minutes noted.  Not sure how and as needed plan of amiodarone work in this situation.  It may be that we need to think about just doing standing antiarrhythmic agent and potentially cutting back beta-blocker.  We will need to discuss with my EP colleagues to figure out a good treatment option.    Bryan Lemma, MD          Recent Labs: 04/20/2022: Hemoglobin 11.3; Platelets 143 09/08/2022: BNP 768.9 09/22/2022: BUN 14; Creatinine, Ser 0.91; Potassium 4.5; Sodium 125  Recent Lipid Panel    Component Value Date/Time   CHOL 132 11/11/2020 0319   CHOL 152 09/19/2019 0940   TRIG 65 11/11/2020 0319   HDL 65 11/11/2020 0319   HDL 76 09/19/2019 0940   CHOLHDL 2.0 11/11/2020 0319   VLDL 13 11/11/2020 0319   LDLCALC 54 11/11/2020 0319   LDLCALC 61 09/19/2019 0940    History of Present Illness    82 year old male with the above past medical history including CAD s/p CABG x 4 (LIMA-LAD, SVG-RPDA and PL, SVG-diagonal) in 2004, HFmrEF, paroxysmal atrial fibrillation, aortic valve replacement/Bentall procedure in 2004, hypertension, hyperlipidemia, and lung cancer.   He underwent aortic valve replacement/Bentall procedure at the time of his CABG in 2004.  Cardiac catheterization in 2018 revealed severe native CAD involving most of the LAD, occluded SVG-PDA along with sequential SVG-PDA/PL.  SVG to diagonal 1 was also occluded.  Myoview in 2020 findings consistent with  prior MI.  Follow-up cardiac catheterization in 11/2020 showed severe single-vessel CAD with extensive mid to distal LAD disease and distal occlusion at LIMA to LAD graft insertion with patent LIMA-LAD, occluded SVG-PDA/PL, occluded SVG-diagonal 1.  Cardiac monitor in 12/2020 showed predominantly sinus rhythm, first-degree AV block, NSVT, PACs/rare PACs and PVCs choledocholithiasis atrial fibrillation.  He was hospitalized in March 2024 at [facility] in [place] in the setting of acute on chronic heart failure.  Echocardiogram revealed EF 45, mildly decreased LV function, elevated LA pressure, moderate RVE/RAE, normally functioning aortic valve prosthesis, peak gradient 13 mmHg.  He was last seen in the office on 10/26/2022 and was stable from a cardiac standpoint.  He was transitioned from valsartan to [facility].  He was also started on Farxiga.  Toprol was decreased to 12.5 mg daily.  Unfortunately, both [place] were cost prohibitive and subsequently discontinued.    He presents today for follow-up.  Since his last visit he has been stable from a cardiac standpoint.  He has noticed increased shortness of breath since starting chemotherapy for recurrent lung cancer.  He denies any chest pain, edema, PND, orthopnea, weight gain.  EKG today shows atrial fibrillation, though he is unaware of atrial fibrillation.  He has not had any evidence of A-fib on his home Kardia mobile device.  He has not taken amiodarone. Other than his shortness of breath, he denies any additional concerns today.   Home Medications    Current Outpatient Medications  Medication Sig Dispense Refill   ampicillin (PRINCIPEN) 500 MG capsule Take 500 mg by mouth every evening.     betamethasone dipropionate (DIPROLENE) 0.05 % ointment Apply 1 application topically 2 (two) times daily as needed (psoriasis).     Cyanocobalamin (RA VITAMIN B12) 2000 MCG TBCR Take 2,000 mcg by mouth in the morning.     DENTA 5000 PLUS 1.1 %  CREA dental cream Place 1 application onto teeth at bedtime.  6   esomeprazole (NEXIUM) 20 MG capsule Take 20 mg by mouth in the morning.     ferrous sulfate 325 (65 FE) MG tablet Take 325 mg by mouth every evening.     finasteride (PROSCAR) 5 MG tablet Take 5 mg by mouth daily.     furosemide (LASIX) 20 MG tablet TAKE 1 TO 2 TABLETS EVERY DAY IF NEEDED 180 tablet 3   LORazepam (ATIVAN) 0.5 MG tablet Take 0.5 mg by mouth in the morning, at noon, and at bedtime.     metoprolol succinate (TOPROL XL) 25 MG 24 hr tablet Take 0.5 tablets (12.5 mg total) by mouth daily. 45 tablet 3   metroNIDAZOLE (METROGEL) 1 % gel Apply 1 application topically daily as needed (roasacea).     nitroGLYCERIN (NITROSTAT) 0.4 MG SL tablet Place 1 tablet (0.4 mg total) under the tongue every 5 (five) minutes as needed for chest pain. 25 tablet 3   potassium chloride SA (KLOR-CON M) 20 MEQ tablet Take 1 tablet (20 mEq total) by mouth daily. 90 tablet 3   ranolazine (RANEXA) 500 MG 12 hr tablet TAKE 1 TABLET TWICE DAILY 180 tablet 3   rosuvastatin (CRESTOR) 20 MG tablet TAKE 1 TABLET EVERY DAY 90 tablet 3   spironolactone (ALDACTONE) 25 MG tablet Take 0.5 tablets (12.5 mg total) by mouth daily. 90 tablet 3   tamsulosin (FLOMAX) 0.4 MG CAPS capsule Take 0.8 mg by mouth at bedtime.     Tiotropium Bromide-Olodaterol (STIOLTO RESPIMAT) 2.5-2.5 MCG/ACT AERS Inhale 2 puffs into the lungs in the morning.     traMADol (ULTRAM) 50 MG tablet PLEASE SEE ATTACHED FOR DETAILED DIRECTIONS     warfarin (COUMADIN) 5 MG tablet TAKE 1 AND 1/2 TABLETS TO 2 TABLETS DAILY AS DIRECTED BY COUMADIN CLINIC 180 tablet 1   amiodarone (PACERONE) 200 MG tablet Take 400 mg (2 x 200 mg) twice daily for 5 days; if not converted then 200 mg a day for 5 more day as needed for atrial fibrillation 30 tablet 6   dapagliflozin propanediol (FARXIGA) 10 MG TABS tablet Take 1 tablet (10 mg total) by mouth daily before breakfast. (Patient not taking: Reported on  12/12/2022) 30 tablet 0   enoxaparin (LOVENOX)  100 MG/ML injection Inject 1 mL (100 mg total) into the skin every 12 (twelve) hours. (Patient not taking: Reported on 12/12/2022) 20 mL 1   sacubitril-valsartan (ENTRESTO) 24-26 MG Take 1 tablet by mouth 2 (two) times daily. (Patient not taking: Reported on 12/12/2022) 60 tablet 3   No current facility-administered medications for this visit.     Review of Systems    He denies chest pain, palpitations, pnd, orthopnea, n, v, dizziness, syncope, edema, weight gain, or early satiety. All other systems reviewed and are otherwise negative except as noted above.   Physical Exam    VS:  BP (!) 140/78   Pulse 97   Ht 5\' 9"  (1.753 m)   Wt 179 lb (81.2 kg)   SpO2 91%   BMI 26.43 kg/m  GEN: Well nourished, well developed, in no acute distress. HEENT: normal. Neck: Supple, no JVD, carotid bruits, or masses. Cardiac: IRIR, aortic click, rubs, or gallops. No clubbing, cyanosis, edema.  Radials/DP/PT 2+ and equal bilaterally.  Respiratory:  Respirations regular and unlabored, clear to auscultation bilaterally. GI: Soft, nontender, nondistended, BS + x 4. MS: no deformity or atrophy. Skin: warm and dry, no rash. Neuro:  Strength and sensation are intact. Psych: Normal affect.  Accessory Clinical Findings    ECG personally reviewed by me today - EKG Interpretation Date/Time:  Monday December 12 2022 14:06:14 EDT Ventricular Rate:  97 PR Interval:    QRS Duration:  94 QT Interval:  368 QTC Calculation: 467 R Axis:   13  Text Interpretation: Atrial fibrillation with premature ventricular or aberrantly conducted complexes Anterior infarct (cited on or before 26-Oct-2022) When compared with ECG of 26-Oct-2022 08:55, Atrial fibrillation has replaced Sinus rhythm Confirmed by Bernadene Person ([id number]) on 12/12/2022 2:11:13 PM  - no acute changes.   Lab Results  Component Value Date   WBC 1.5 (LL) 04/20/2022   HGB 11.3 (L) 04/20/2022   HCT 32.6 (L) 04/20/2022    MCV 103 (H) 04/20/2022   PLT 143 (L) 04/20/2022   Lab Results  Component Value Date   CREATININE 0.91 09/22/2022   BUN 14 09/22/2022   NA 125 (L) 09/22/2022   K 4.5 09/22/2022   CL 88 (L) 09/22/2022   CO2 24 09/22/2022   Lab Results  Component Value Date   ALT 17 11/05/2020   AST 19 11/05/2020   ALKPHOS 59 11/05/2020   BILITOT 0.8 11/05/2020   Lab Results  Component Value Date   CHOL 132 11/11/2020   HDL 65 11/11/2020   LDLCALC 54 11/11/2020   TRIG 65 11/11/2020   CHOLHDL 2.0 11/11/2020    No results found for: "HGBA1C"  Assessment & Plan    1. HFmrEF: Most recent echo in 07/2022 revealed EF 45, mildly decreased LV function, elevated LA pressure, moderate RVE/RAE, normally functioning aortic valve prosthesis, peak gradient 13 mmHg, mean gradient 8 mmHg.  He has noted increased shortness of breath since starting chemotherapy.  Generally euvolemic and well compensated on exam.  Entresto and Farxiga/Jardiance were cost prohibitive.  Will start spironolactone 12.5 mg daily.  Will check BMET in 2 weeks.  Continue metoprolol, valsartan, Lasix.   2. CAD: S/p CABG x 4 in 2004. Cath in 2022 showed severe single-vessel CAD with extensive mid to distal LAD disease and distal occlusion at LIMA to LAD graft insertion with patent LIMA-LAD, occluded SVG-PDA/PL, occluded SVG-diagonal 1.  He notes increased dyspnea on exertion since starting chemotherapy, otherwise stable with no anginal symptoms.  Continue to  monitor symptoms.  No ASA in the setting of chronic anticoagulation.  Continue metoprolol, valsartan, Ranexa, and Crestor.   3. Paroxysmal atrial fibrillation: EKG today shows rate controlled atrial fibrillation.  Patient is unaware of A-fib.  He has not had any evidence of a. fib on his home Kardia mobile device.  I advised him to take amiodarone as previously recommended by Dr. Herbie Baltimore and to notify us should he have persistent atrial fibrillation.  Continue amiodarone as needed,  warfarin.   4. Aortic valve replacement/Bentall procedure: Most recent echo in 07/2022 showed normally functioning aortic valve prosthesis, mean gradient 8 mmHg.  Continue SBE prophylaxis.   5. Hypertension: BP well controlled. Continue current antihypertensive regimen.    6. Hyperlipidemia: LDL was 40 in 01/2022.  Continue Crestor.   7.  Lung cancer: Recurrent, he is currently undergoing chemotherapy. Following with oncology.  8. Disposition: Follow-up as scheduled with Dr. Herbie Baltimore in 02/2023.    Joylene Grapes, NP 12/12/2022, 3:08 PM

## 2022-12-20 ENCOUNTER — Ambulatory Visit (INDEPENDENT_AMBULATORY_CARE_PROVIDER_SITE_OTHER): Payer: Medicare HMO | Admitting: Cardiovascular Disease

## 2022-12-20 DIAGNOSIS — Z952 Presence of prosthetic heart valve: Secondary | ICD-10-CM

## 2022-12-20 DIAGNOSIS — I480 Paroxysmal atrial fibrillation: Secondary | ICD-10-CM | POA: Diagnosis not present

## 2022-12-20 DIAGNOSIS — Z5181 Encounter for therapeutic drug level monitoring: Secondary | ICD-10-CM

## 2022-12-20 LAB — POCT INR: INR: 4.3 — AB (ref 2.0–3.0)

## 2022-12-20 NOTE — Patient Instructions (Signed)
Description   SELF TESTER: Spoke with pt, advised pt to HOLD WARFARIN TODAY ONLY THEN  START to take warfarin 1 tablet daily except for 1.5 tablets on Mondays  and Fridays. Recheck INR in 2 weeks. [facility] [phone].

## 2022-12-30 LAB — BASIC METABOLIC PANEL
BUN: 12 mg/dL (ref 8–27)
Sodium: 121 mmol/L — ABNORMAL LOW (ref 134–144)
eGFR: 88 mL/min/{1.73_m2} (ref >59)

## 2023-01-03 ENCOUNTER — Telehealth: Payer: Self-pay

## 2023-01-03 ENCOUNTER — Ambulatory Visit (INDEPENDENT_AMBULATORY_CARE_PROVIDER_SITE_OTHER): Payer: Medicare HMO

## 2023-01-03 DIAGNOSIS — Z5181 Encounter for therapeutic drug level monitoring: Secondary | ICD-10-CM | POA: Diagnosis not present

## 2023-01-03 DIAGNOSIS — I480 Paroxysmal atrial fibrillation: Secondary | ICD-10-CM

## 2023-01-03 DIAGNOSIS — Z952 Presence of prosthetic heart valve: Secondary | ICD-10-CM

## 2023-01-03 LAB — POCT INR: INR: 3.3 — AB (ref 2.0–3.0)

## 2023-01-03 NOTE — Telephone Encounter (Signed)
Patient is returning call. 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

## 2023-01-03 NOTE — Telephone Encounter (Signed)
INR due. Called, no answer. Left message on voicemail.

## 2023-01-03 NOTE — Patient Instructions (Signed)
Description   SELF TESTER: Spoke with pt, advised pt to continue taking warfarin 1 tablet daily except for 1.5 tablets on Mondays and Fridays.  Recheck INR in 2 weeks.  [facility] [phone].

## 2023-01-17 ENCOUNTER — Ambulatory Visit (INDEPENDENT_AMBULATORY_CARE_PROVIDER_SITE_OTHER): Payer: Self-pay | Admitting: Cardiology

## 2023-01-17 DIAGNOSIS — Z5181 Encounter for therapeutic drug level monitoring: Secondary | ICD-10-CM

## 2023-01-17 DIAGNOSIS — Z952 Presence of prosthetic heart valve: Secondary | ICD-10-CM

## 2023-01-17 LAB — POCT INR: INR: 2.6 (ref 2.0–3.0)

## 2023-01-27 ENCOUNTER — Encounter: Payer: Self-pay | Admitting: Cardiology

## 2023-01-28 NOTE — Telephone Encounter (Signed)
Sorry to hear that you are in the hospital.  I did see the echocardiogram result and it looks like the pump function is down a little bit.  This could have been simply because of the acute illness.  I think it something we can reassess in a few months to see if it is back to your normal again.  Previous pulm function was 45 to 50% and now was read as ~35-40%.      Would probably benefit from being seen for hospital follow-up.  Bryan Lemma, MD

## 2023-01-31 ENCOUNTER — Ambulatory Visit (INDEPENDENT_AMBULATORY_CARE_PROVIDER_SITE_OTHER): Payer: Medicare HMO | Admitting: Cardiovascular Disease

## 2023-01-31 DIAGNOSIS — Z5181 Encounter for therapeutic drug level monitoring: Secondary | ICD-10-CM

## 2023-01-31 DIAGNOSIS — Z952 Presence of prosthetic heart valve: Secondary | ICD-10-CM

## 2023-01-31 LAB — POCT INR: INR: 4.1 — AB (ref 2.0–3.0)

## 2023-01-31 NOTE — Patient Instructions (Signed)
Description   SELF TESTER: Spoke with pt, advised pt to HOLD today's dose and then continue taking warfarin 1 tablet daily except for 1.5 tablets on Mondays and Fridays.  Recheck INR in 1 week.  [facility] [phone].

## 2023-02-07 ENCOUNTER — Ambulatory Visit (INDEPENDENT_AMBULATORY_CARE_PROVIDER_SITE_OTHER): Payer: Self-pay | Admitting: *Deleted

## 2023-02-07 DIAGNOSIS — I480 Paroxysmal atrial fibrillation: Secondary | ICD-10-CM

## 2023-02-07 DIAGNOSIS — Z5181 Encounter for therapeutic drug level monitoring: Secondary | ICD-10-CM

## 2023-02-07 DIAGNOSIS — Z952 Presence of prosthetic heart valve: Secondary | ICD-10-CM | POA: Diagnosis not present

## 2023-02-07 DIAGNOSIS — D6869 Other thrombophilia: Secondary | ICD-10-CM | POA: Diagnosis not present

## 2023-02-07 LAB — POCT INR: INR: 3.5 — AB (ref 2.0–3.0)

## 2023-02-07 NOTE — Patient Instructions (Signed)
Description   SELF TESTER: Spoke with pt, advised pt to start taking warfarin 1 tablet daily except for 1.5 tablets on Mondays. Recheck INR in 1 week (normally 2 weeks).  [facility] [phone].

## 2023-02-14 ENCOUNTER — Other Ambulatory Visit: Payer: Self-pay

## 2023-02-14 ENCOUNTER — Encounter: Payer: Self-pay | Admitting: Cardiology

## 2023-02-14 DIAGNOSIS — Z952 Presence of prosthetic heart valve: Secondary | ICD-10-CM

## 2023-02-14 DIAGNOSIS — Z5181 Encounter for therapeutic drug level monitoring: Secondary | ICD-10-CM

## 2023-02-15 ENCOUNTER — Telehealth: Payer: Self-pay

## 2023-02-15 NOTE — Telephone Encounter (Signed)
INR overdue. Called pt to determine if he had been to lab to have INR drawn. Pt states he has not been feeling well but plans to go to the lab tomorrow. Pt also sates he is going to call to order for test strips for his INR machine.   *Addendum 10/10 by Dr. Maximino Sarin*  Received page on 10/10 at [phone] for critical lab from [facility]. INR > 10.0. I call patient at home phone number. NO signs or symptoms of bleeding. NO recent changes to warfarin dose. Last INR prior was 3.4 on 10/1. I advised patient to hold further doses and get his INR rechecked by [facility] as soon as possible today. We will follow up result and be in touch with further recommendations.

## 2023-02-16 ENCOUNTER — Telehealth: Payer: Self-pay

## 2023-02-16 ENCOUNTER — Ambulatory Visit (INDEPENDENT_AMBULATORY_CARE_PROVIDER_SITE_OTHER): Payer: Medicare HMO

## 2023-02-16 DIAGNOSIS — Z952 Presence of prosthetic heart valve: Secondary | ICD-10-CM | POA: Diagnosis not present

## 2023-02-16 DIAGNOSIS — Z5181 Encounter for therapeutic drug level monitoring: Secondary | ICD-10-CM | POA: Diagnosis not present

## 2023-02-16 DIAGNOSIS — D6869 Other thrombophilia: Secondary | ICD-10-CM | POA: Diagnosis not present

## 2023-02-16 DIAGNOSIS — I480 Paroxysmal atrial fibrillation: Secondary | ICD-10-CM | POA: Diagnosis not present

## 2023-02-16 LAB — PROTIME-INR
INR: >10 (ref 0.9–1.2)
Prothrombin Time: >90 s — ABNORMAL HIGH (ref 9.1–12.0)

## 2023-02-16 NOTE — Telephone Encounter (Signed)
Attempted to call pt concerning INR results, no answer. Left message on voicemail with direct call back number. Refer to anticoagulation encounter.

## 2023-02-16 NOTE — Telephone Encounter (Signed)
Pt returned the call and stated that the EMS was there and he is going to [facility] at this time. Advised will follow up once discharged back home.

## 2023-02-17 NOTE — Patient Instructions (Signed)
Description   SELF TESTER: Spoke with pt, advised pt to CONTINUE HOLDING WARFARIN PER [facility] AND TO CALL ONCE DISCHARGED FROM HOSPITAL.  Normal dose: warfarin 1 tablet daily except for 1.5 tablets on Mondays.  Recheck INR FRIDAY (normally 2 weeks).  [facility] [phone].

## 2023-02-21 ENCOUNTER — Ambulatory Visit: Payer: Medicare HMO | Admitting: Cardiology

## 2023-02-21 NOTE — Progress Notes (Deleted)
Cardiology Office Note:  .   Date:  02/21/2023  ID:  Erik Meyer, DOB [birth year], MRN [id number] PCP: Penelope Galas, MD  [facility] Providers Cardiologist:  Bryan Lemma, MD { Click to update primary MD,subspecialty MD or APP then REFRESH:1}    CONSULTING CARDIOLOGIST At [facility]: Dr. Wille Glaser   No chief complaint on file.   Patient Profile: .     Erik Meyer is a  82 y.o. 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  Erik Meyer was last seen on December 12, 2022 by Bernadene Person, NP    Subjective   INTERVAL HPI Diagnosed with PE on 02/10/2023 and placed on warfarin. =>  INR was 11 on 02/16/2023 Admitted to [facility] 02/16/2023: Admission NT proBNP 23,801 Diuresed net -7700.  Complicated by hyponatremia Treated with vitamin K-INR reduced to 2.1 (goal INR roughly 2.5-restarted on warfarin) 900 mL fluid removal with thoracentesis on 10/14      Objective   Studies Reviewed: .        Echo 01/25/2023: LVEF 35 to 40%, basal inferior lateral akinesis. Hypokinetic basal and mid inferior and mid inferolateral Echo 07/27/2022 LVEF 45 to 50%, mechanical Saint Jude aortic valve with normal structure and function, moderate TR, moderate to severe pulmonary hypertension, severe biatrial enlargement. Echo 06/21/2021: LVEF 60 to 65%, moderate LA dilation, moderate RV dilation  CTA Chest-PE 02/16/2023: No PE.  Increasing pleural effusions.  Increasing left subclavicular mass; bilateral infiltrate or edema.  Stable RLL nodule and decreasing LLL nodule.  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  Thoracentesis 02/20/2023  Coronary artery disease S/P CABG x 4 (LIMA to LAD, SVG to RPDA, SVG to PL and SVG to  diagonal) on 12/26/2002 LHC 11/11/20: 65% proximal LAD, 65% mid LAD, 100% CTO. Patent LIMA to distal LAD, Ost rPDA 100% CTO, SVG-D2 100%. SVG-rPDA-PL occluded.    Risk Assessment/Calculations:    CHA2DS2-VASc Score = 7  {Confirm score is correct.  If not, click here to update score.  REFRESH note.  :1} This indicates a 11.2% annual risk of stroke. The patient's score is based upon: CHF History: 1 HTN History: 1 Diabetes History: 0 Stroke History: 2 Vascular Disease History: 1 Age Score: 2 Gender Score: 0         Physical Exam:   VS:  There were no vitals taken for this visit.   Wt Readings from Last 3 Encounters:  12/12/22 179 lb (81.2 kg)  10/26/22 179 lb 3.2 oz (81.3 kg)  09/08/22 182 lb 6.4 oz (82.7 kg)    GEN: Well nourished, well developed in no acute distress;  NECK: No JVD; No carotid bruits CARDIAC: Normal S1, S2; RRR, no murmurs, rubs, gallops RESPIRATORY:  Clear to auscultation without rales, wheezing or rhonchi ; nonlabored, good air movement. ABDOMEN: Soft, non-tender, non-distended EXTREMITIES:  No edema; No deformity      ASSESSMENT AND PLAN: .    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  Total time spent: N/A min spent with patient + N/A min spent charting = N/A min      Signed, Marykay Lex, MD, MS Bryan Lemma, M.D., M.S. Interventional  Cardiologist  [facility]  Pager # [phone] Phone # [phone] [address] [place], [place] [address]

## 2023-02-22 ENCOUNTER — Other Ambulatory Visit: Payer: Self-pay

## 2023-02-22 DIAGNOSIS — Z952 Presence of prosthetic heart valve: Secondary | ICD-10-CM

## 2023-02-22 NOTE — Progress Notes (Deleted)
Cardiology Office Note:    Date:  02/22/2023   ID:  Erik Meyer, DOB Mar 29, 1941, MRN [id number]  PCP:  Erik Galas, MD   [facility] Providers Cardiologist:  Erik Lemma, MD { Click to update primary MD,subspecialty MD or APP then REFRESH:1}    Referring MD: Erik Coder Zannie Cove, MD   No chief complaint on file. ***  History of Present Illness:    Erik Meyer is a 82 y.o. 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 Afib is controlled with amiodarone and toprol. She is on warfarin for Afib and AVR.  Beta-blocker has not been titrated due to concerns for fatigue.  He is managed with valsartan and amlodipine.   Heart catheterization 11/2020 with occluded SVG-PDA, occluded SVG-second diagonal, patent LIMA-LAD. He has a history of anterior chest wall pain. Heart monitor 12/2020 revealed no Afib, but bursts of PAT/SVT.  He was last seen by Dr. Herbie Baltimore 08/24/2021 and was started on PO amiodarone load for breakthrough Afib.  Patient is generally symptomatic with A-fib and confirms on his Kardia mobile device.  Plan was for DCCV if he did not convert with p.o. amnio load.  He was hospitalized 07/2022 at [facility] for acute on chronic systolic heart failure with LVEF 45-50%, elevated LA pressure, moderate RVE/RAE, normally functioning AVR. GDTM attempted with entresto and farxiga, but both were cost prohibitive and discontinued. Unfortunately, hospitalized again 02/15/23 with acute on chronic systolic heart failure. He was diuresed, complicated with hyponatremia. INR was 12 on admission, reversed with vitamin K. Discharged on home dose of 20 mg lasix, candesartan, and low dose coreg. Afib rate controlled on amiodarone and anticoagulated with coumadin. Echo that  admission further reduced at 35-40%.   GDMT listed: entresto, farxiga, toprol, spironolactone, crestor, lasix, amiodarone 200 mg PO taper  He presents for follow up.       CAD s/p CABG x 4 Hyperlipidemia with LDL goal less than 70 Microvascular disease, MSK chest pain History of stable heart catheterizations with both native and graft disease. History of 2 nuclear stress test that were nonischemic Not on antiplatelet therapy in the setting of Coumadin Continue 20 mg rosuvastatin Needs updated lipid panel - who follows this He does have a history of MSK pain His microvascular disease is treated with amlodipine and Toprol along with Ranexa   Hypertension Medications as above   Chronic systolic heart failure GDMT: toprol, farxiga, entresto, spironolactone, lasix - EF now 35-40%   AS s/p mechanical aortic valve At the time of CABG Requires SBE prophylaxis   Paroxysmal atrial fibrillation Unable to titrate beta-blocker given fatigue.  He is now on amiodarone EKG today   Chronic Coumadin therapy INR 2.5-3.5 This patients CHA2DS2-VASc Score and unadjusted Ischemic Stroke Rate (% per year) is equal to 11.2 % stroke rate/year from a score of 7 (CHF, CAD, HTN, 2age, 2TIA)       Past Medical History:  Diagnosis Date   Anemia    CAD, multiple vessel 12/2002   Found during preop cath for [facility] AVR procedure; Myoview 5/'13: No ischemia or infarct, EF 53% --> cath December 2018: Proximal LAD 65% followed by mid LAD 65% then 100% occlusion.  Patent LIMA-LAD.  SVG-D2 occluded.  RPDA occluded. Seq SVG-r PDA-PL patent to PDA, but the PDA is occluded  as is the proximal native PDA; Similar Findings 11/2020   COPD (chronic obstructive pulmonary disease) (HCC)    Emphysema noted on CT scan.   Diverticulosis    Dyslipidemia, goal LDL below 70    Monitored by PCP. On statin (last labs scan from September 2015: TC 149, TG 127, HDL 55, LDL 69)   ED (erectile dysfunction)    GERD  (gastroesophageal reflux disease)    History of SBO (small bowel obstruction) 07/2013   Abdominal surgery with LOA   Hx of irritable bowel syndrome    Hypertension, essential    Lesion of right native kidney 08/20/2013   Abdominal US : 2.4 cm x 1.7 complex lesion in the upper pole the right kidney in March of 2012 concerning for RCC although enlarging hemorrhagic cyst could have this appearance as well.,    Monoclonal gammopathy of undetermined significance    Paroxysmal-persistent atrial fibrillation (HCC); CHA2DS2-VASc Score 7 03/23/2017   CHA2DS2-VASc (CHF, HTN, CAD, agex2, TIAx2 = 7)-> Most recent episode January 3-5, 2022; unsuccessful cardioversion with amiodarone; -> went to ER-synchronized DCCV x1   Psoriasis    S/P AVR (aortic valve replacement) and aortoplasty 12/2002   Dr. Laneta SimmersZachary George procedure - St. Jude aVR (25 mm prosthesis) with aortic root conduit;; Echo 10/'14: EF 55-60%, Mod Conc LVH, Gr 1 DD, Well Seated AoV Mech Prosthesis with normal P gradients (no stenosis), Mod-Severe LA dilation --> follow-up echo November 2016: Well functioning mechanical valve. Paradoxical septal motion. EF 50-55%. Severe LA dilation. Moderate RA dilation.    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 JUDE mechanical valve conduit with reimplantation of the coronary arteries   CARDIAC CATHETERIZATION  12/20/2002   diffuse  coronary disease,prior to his BENTALL PROCEDURE   CORONARY ARTERY BYPASS GRAFT  12/26/2002   LIMA to LAD,SVG to diagonal, SVG to PDA and PLA.    CORONARY/GRAFT ANGIOGRAPHY N/A 04/21/2017   Procedure: CORONARY/GRAFT ANGIOGRAPHY;  Surgeon:  Marykay Lex, MD;  Location: [facility] INVASIVE CV LAB;  Service: Cardiovascular; Native RCA ~patent to PAV/PL, Native LM-LCx(OM1 & OM2), LAD-D1 & D2 then 100% after SP2). Patent LIMA-dLAD, occluded SeqSVG-RPDA-PL & SVG-D2. --. FINDINGS CORELATED WELL WITH PERFUSION DEFECT SEEN ON MYOVIEW - INFERIOR INFARCT & ANTERIOR INFARCT. --Med Rx   CT-Guided Lung Biopsy  07/22/2021   [facility]   EXERCISE TOLERANCE TEST  09/12/2017    5 METS Baseline ST depression.  No new changes with exercise.  Heart rate went to 141 bpm.  Poor exercise tolerance reaching target heart rate at 2:26-minute,.  No chronotropic Incompetence   LAPAROSCOPIC LYSIS OF ADHESIONS  07/2013   performed in [place] for SBO    LEFT HEART CATH AND CORONARY ANGIOGRAPHY N/A 11/11/2020   Procedure: LEFT HEART CATH AND CORONARY ANGIOGRAPHY;  Surgeon: Marykay Lex, MD;  Location: [facility] INVASIVE CV LAB;  Service: Cardiovascular;   pLAD 65%, mLAd 65% (stable) -> 100% CTO. Patent LIMA-dLAD. Ost rPDA 100% CTO with patent RPL system.. SVG-D2 100%. SVG-rPDA-PL occluded. Native LCx - ~normal.  STABLE FINDINGS (Still correlate with Myoview)   NM MYOVIEW LTD  10/04/2011   09/2011: showed no  ischemia;EF52%; 03/2017 - [facility]) HIGH RISK.  Mild LV dilation.  EF 45 to 50%..  Concern for prior infarct in the inferior lateral wall and apical anterior wall consistent with infarct with peri-infarct ischemia. => On review, these findings correlate with cardiac catheter showing inferior infarct and anterior infarct.   NM MYOVIEW LTD  10/28/2020   Equivocal Findings;   EF ~45-50%. No EKG changes. Medium Size - Severe defect - basal inferolateral & mid inferolateral. Also Medium Size-Severe defect in mid Anterior, apical Anterior & Apex. C/w Prior MI with Peri-Infarct Ischemia. - INTERMEDIATE RISK - STABLE FROM 2018   PARTIAL COLECTOMY  1994   THORACIC AORTIC ANEURYSM REPAIR  2004   Saint Jude aortic valve conduit-Bentall AVR   TRANSTHORACIC  ECHOCARDIOGRAM  06/21/2021   [facility]) EF 60 to 65%.  Mild diastolic dysfunction.  Normal LAP.  Moderate LA dilation.  Moderate RV dilation.  Moderate-severe RA dilation.  Very mild AI.   TRANSTHORACIC ECHOCARDIOGRAM  11/2018    Stable EF 45-50%-moderate HK of apical-anteroseptal wall..  Moderate asymmetric LVH with GR 1 DD.  Normal RV.  RA mildly dilated.  Moderate MAC.   TRANSTHORACIC ECHOCARDIOGRAM  11/11/2020   EF 45 to 50%.  Mildly decreased function.  Inferolateral hypokinesis and small area of anteroseptal wall motion abnormality.  RV is borderline size with moderate dysfunction-normal CVP.Marland Kitchen  Both atria are moderately dilated.  25 mm bileaflet mechanical aortic valve in place.  Normal function and normal gradients..   TRANSTHORACIC ECHOCARDIOGRAM  07/27/2022   [facility]: A)05/2022: Mildly reduced EF 45 to 50%.  Bioprosthetic? 25 mm Saint Jude AVR w/ normal structure and fxn.  Mod TR, mod-severe PulmHTN w/ RAP ~15 mmHg.  Severe biatrial enlargement;; b) 07/27/22:: Mild LVH.  EF 45 to 50%.  Abnormal strain.  Mild HK with abnormal septal motion.  Moderate dilated RV w/mod dysfxn.  BiAE-LA severe, RA mod.  Prosthetic AVR nl Fxn.  P-M gradient 13 & 8 mmHg.   VENTRAL HERNIA REPAIR     repair with mesh by Dr Loreta Ave at [facility]    Current Medications: No outpatient medications have been marked as taking for the 02/28/23 encounter (Appointment) with Marcelino Duster, PA.     Allergies:   Duloxetine and Doxycycline   Social History   Socioeconomic History   Marital status: Married    Spouse name: Not on file   Number of children: 0   Years of education: Not on file   Highest education level: Not on file  Occupational History    Comment: Retired  Tobacco Use   Smoking status: Former    Current packs/day: 0.00    Types: Cigarettes    Quit date: 06/16/1997    Years since quitting: 25.7   Smokeless tobacco: Never  Vaping Use   Vaping status: Never Used  Substance and Sexual  Activity   Alcohol use: Yes   Drug use: No   Sexual activity: Not on file  Other Topics Concern   Not on file  Social History Narrative   Married gentleman with no children. Quit smoking in 1998.   Education: 2 years of college. He does drink caffeine.  He drinks social beer.   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 Tresa Endo in [place]; GI: Dr. Quinn Axe in [place]      **  November 2020: He notes increased stress family to care for his wife who is showing signs of worsening dementia.  Hard to adjust to this and is somewhat depressing.    -He is try to work on cutting back on his intake of salt specific   Social Determinants of Health   Financial Resource Strain: Low Risk  (05/26/2022)   Received from [facility], [facility]   Overall Financial Resource Strain (CARDIA)    Difficulty of Paying Living Expenses: Not hard at all  Food Insecurity: No Food Insecurity (02/17/2023)   Received from [facility]   Hunger Vital Sign    Worried About Running Out of Food in the Last Year: Never true    Ran Out of Food in the Last Year: Never true  Transportation Needs: No Transportation Needs (02/17/2023)   Received from [facility] - Transportation    Lack of Transportation (Medical): No    Lack of Transportation (Non-Medical): No  Physical Activity: Sufficiently Active (05/26/2022)   Received from [facility], [facility]   Exercise Vital Sign    Days of Exercise per Week: 3 days    Minutes of Exercise per Session: 50 min  Stress: No Stress Concern Present (02/17/2023)   Received from [facility] - Occupational Stress Questionnaire    Feeling of Stress : Not at all  Social Connections: Moderately Integrated (05/26/2022)   Received from [facility], [facility]   Social Network    How would you rate your social network (family, work, friends)?: Adequate participation with social networks     Family History: The patient's ***family history includes COPD in his sister; Colon cancer in his mother; Congestive Heart Failure in his mother; Lung cancer in his father.  ROS:   Please see the history of present illness.    *** All other systems reviewed and are negative.  EKGs/Labs/Other Studies Reviewed:    The following studies were reviewed today:  Echo 01/25/23 ([facility]) Left Ventricle: Systolic function is moderately abnormal. EF: 35-40%.  Ejection fraction measured by 3D is 36%,    Left Ventricle: Wall motion abnormalities as outlined in graphic  representation.   Left Atrium: Left atrium volume index is severely increased (>48  mL/m2).         Recent Labs: 04/20/2022: Hemoglobin 11.3; Platelets 143 09/08/2022: BNP 768.9 12/29/2022: BUN 12; Creatinine, Ser 0.84; Potassium 5.1; Sodium 121  Recent Lipid Panel    Component Value Date/Time   CHOL 132 11/11/2020 0319   CHOL 152 09/19/2019 0940   TRIG 65 11/11/2020 0319   HDL 65 11/11/2020 0319   HDL 76 09/19/2019 0940   CHOLHDL 2.0 11/11/2020 0319   VLDL 13 11/11/2020 0319   LDLCALC 54 11/11/2020 0319   LDLCALC 61 09/19/2019 0940     Risk Assessment/Calculations:   {Does this patient have ATRIAL FIBRILLATION?:[phone]}  No BP recorded.  {Refresh Note OR Click here to enter BP  :1}***         Physical Exam:    VS:  There were no vitals taken for this visit.    Wt Readings from Last 3 Encounters:  12/12/22 179 lb (81.2 kg)  10/26/22 179 lb 3.2 oz (81.3 kg)  09/08/22 182 lb 6.4 oz (82.7 kg)     GEN: *** Well nourished, well developed in no acute distress HEENT: Normal NECK: No JVD; No carotid bruits LYMPHATICS: No lymphadenopathy CARDIAC: ***RRR, no murmurs, rubs, gallops RESPIRATORY:  Clear to auscultation without rales, wheezing or rhonchi  ABDOMEN: Soft,  non-tender, non-distended MUSCULOSKELETAL:  No edema; No deformity  SKIN: Warm and dry NEUROLOGIC:  Alert and oriented x 3 PSYCHIATRIC:  Normal affect   ASSESSMENT:    No diagnosis found. PLAN:    In order of problems listed above:  ***      {Are you ordering a CV Procedure (e.g. stress test, cath, DCCV, TEE, etc)?   Press F2        :[id number]}    Medication Adjustments/Labs and Tests Ordered: Current medicines are reviewed at length with the patient today.  Concerns regarding medicines are outlined above.  No orders of the defined types were placed in this encounter.  No orders of the defined types were placed in this encounter.   There are no Patient Instructions on file for this visit.   Signed, Roe Rutherford [employer], PA  02/22/2023 3:10 PM     [facility]

## 2023-02-23 LAB — PROTIME-INR
INR: 2.3 — ABNORMAL HIGH (ref 0.9–1.2)
Prothrombin Time: 24.5 s — ABNORMAL HIGH (ref 9.1–12.0)

## 2023-02-24 ENCOUNTER — Ambulatory Visit (INDEPENDENT_AMBULATORY_CARE_PROVIDER_SITE_OTHER): Payer: Medicare HMO | Admitting: Internal Medicine

## 2023-02-24 DIAGNOSIS — Z5181 Encounter for therapeutic drug level monitoring: Secondary | ICD-10-CM

## 2023-02-24 DIAGNOSIS — Z952 Presence of prosthetic heart valve: Secondary | ICD-10-CM | POA: Diagnosis not present

## 2023-02-24 NOTE — Patient Instructions (Signed)
Description   Spoke with pt, advised pt to CONTINUE Lovenox Injections 80mg  BID. Today take 1.5 tablets (7.5mg ) of warfarin then continue to take warfarin 1 tablet (5 mg ) daily except for 1.5 tablets (7.5mg  )on Mondays. Recheck INR on 02/27/2023.  Pt is usually a self tester who checks INR every 2 weeks.  [facility] [phone].

## 2023-02-26 ENCOUNTER — Encounter: Payer: Self-pay | Admitting: Cardiology

## 2023-02-27 ENCOUNTER — Ambulatory Visit (INDEPENDENT_AMBULATORY_CARE_PROVIDER_SITE_OTHER): Payer: Medicare HMO | Admitting: Cardiology

## 2023-02-27 DIAGNOSIS — I480 Paroxysmal atrial fibrillation: Secondary | ICD-10-CM

## 2023-02-27 LAB — PROTIME-INR
INR: 2.4 — ABNORMAL HIGH (ref 0.9–1.2)
Prothrombin Time: 25.5 s — ABNORMAL HIGH (ref 9.1–12.0)

## 2023-02-27 LAB — POCT INR: INR: 2.9 (ref 2.0–3.0)

## 2023-02-27 NOTE — Patient Instructions (Signed)
Description   Called and spoke to pt and instructed him to STOP Lovenox and then continue to take warfarin 1 tablet (5 mg ) daily except for 1.5 tablets (7.5mg  )on Mondays. Recheck INR in 1 week.  C[facility] [phone].

## 2023-02-27 NOTE — Progress Notes (Unsigned)
Cardiology Office Note    Date:  02/28/2023  ID:  EBER HAWK, DOB [birth year], MRN [id number] PCP:  Penelope Galas, MD  Cardiologist:  Bryan Lemma, MD  Electrophysiologist:  None   Chief Complaint: Hospital follow up HFrEF   History of Present Illness: .    Erik Meyer is a 82 y.o. 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   In 2004 he underwent aortic mechanical valve replacement/Bentall procedure at the time of his CABG.  Cardiac catheterization in 2018 revealed severe native CAD involving most of the LAD, occluded SVG to PDA along with sequential SVG to PDA/PL.  SVG to diagonal 1 was also occluded.  Myoview in 2020 findings consistent with prior MI.  Follow-up cardiac catheterization in 11/2020 showed severe single-vessel CAD with extensive mid to distal LAD disease distal occlusion at LIMA to LAD graft insertion with patent LIMA to LAD, occluded SVG to PDA/PL, clued SVG to diagonal 1.  Cardiac monitor in 12/2020 showed predominantly sinus rhythm, first-degree AV block, NSVT, rare PACs and PVCs, premature atrial tachycardia.   In March 2024 he was hospitalized at [facility] in [place] in the setting of acute on chronic heart failure.  Echocardiogram revealed EF 45%, mildly decreased LV function, elevated LA pressure, moderate RVE/RAE, normally functioning aortic valve prosthesis, peak gradient 13 mmHg.  He was previously transition from valsartan to [place] and started on Farxiga, unfortunately both Entresto and Farxiga were cost prohibitive and subsequently discontinued.  He was last seen in clinic on 12/12/2022, he had remained stable from a cardiac standpoint.  He noted increased shortness of breath  since starting chemotherapy for recurrent lung cancer.  He was noted to be in atrial fibrillation although he remained cardiac unaware.  On 01/24/2023 he presented to [facility] with abnormal sodium on routine blood work, his sodium level was 113.  Patient reported that his Lasix had been discontinued previously secondary to hyponatremia.  He reported increased swelling in his abdomen as well as legs.  His D-dimer was elevated so a CT PE was performed that showed small amount of segmental and subsegmental left lower lobe pulmonary embolus mild bilateral effusions.  Echocardiogram on 01/25/2023 indicated LVEF 35 to 40%, his warfarin was continued given PE and afib.  His hyponatremia was treated with 3 doses of tolvaptan, he was continued on salt tablets at discharge.  On 02/16/23 patient presented via EMS to [facility] with increased shortness of breath and elevated INR.  In the ED he was noted to have a proBNP of 25,000, and CT of the chest showed bilateral pleural effusions.  On 10/14 he underwent thoracentesis with removal of 930 ml of serous fluid, no malignancy identified, no growth on cultures.  He was discharged on Lovenox bridging, carvedilol 3.125 mg twice daily, magnesium oxide 200 mg twice daily, his Lasix was changed to 20 mg daily and his spironolactone was increased to 25 mg daily.  Today he presents for hospital follow-up.  He reports that he feels he is at his baseline.  He notes that his strength and breathing has slightly improved since his discharge from [facility].  He denies edema, PND or orthopnea.  He reports that he does not feel great today as he had chemotherapy yesterday and he always feels  slightly bad the day after. He denies chest pain, notes that he has not been in atrial fibrillation since discharge.  Reports that he has not required amiodarone in over a month.  His EKG today indicates sinus rhythm with first-degree AV block.   Labwork independently  reviewed: 02/27/2023: Creatinine 0.81, sodium 125, potassium 4.5, albumin 3.2, AST 11, ALT 8, WBC 4.3, hemoglobin 9.4, hematocrit 28.1, TSH 2.770, Free T4 1.88 02/22/2023: WBC 1.3, RBC 2.65, hemoglobin 9.0, hematocrit 26.2, sodium 128, potassium 4.3, creatinine 0.64, magnesium 2.1, INR 1.7  ROS: .   Today he denies chest pain, lower extremity edema, palpitations, melena, hematuria, hemoptysis, diaphoresis, weakness, presyncope, syncope, orthopnea, and PND.  All other systems are reviewed and otherwise negative.  Studies Reviewed: Marland Kitchen    EKG:  EKG is ordered today, personally reviewed, demonstrating  EKG Interpretation Date/Time:  Tuesday February 28 2023 10:46:39 EDT Ventricular Rate:  88 PR Interval:  304 QRS Duration:  94 QT Interval:  346 QTC Calculation: 418 R Axis:   16  Text Interpretation: Sinus rhythm with 1st degree A-V block Septal infarct (cited on or before 26-Oct-2022) Nonspecific T wave abnormality When compared with ECG of 12-Dec-2022 14:06, Sinus rhythm has replaced Atrial fibrillation QT has shortened Confirmed by Reather Littler [id number]) on 02/28/2023 10:56:05 AM   CV Studies:  Cardiac Studies & Procedures   CARDIAC CATHETERIZATION  CARDIAC CATHETERIZATION 11/11/2020  Narrative  Prox LAD lesion is 65% stenosed. Mid LAD-1 lesion is 65% stenosed. Stable in appearance from previous cath  Mid LAD-2 lesion is 100% stenosed -> at LIMA graft insertion site with better flow  Ost RPDA lesion is 100% stenosed.  ---GRAFTS---  LIMA graft was visualized by angiography and is moderate in size with no notable disease.. There is competitive flow at the insertion site.  Seq SVG- rPDA-PL graft was not visualized due to known occlusion.  SVG-DIAG graft was visualized by angiography and is 100% stenosed @ the origin.  -------  ---> Normal functioning Prosthetic Bileaflet Aortic Valve  SUMMARY  Severe single-vessel CAD with extensive mid to distal LAD disease and distal occlusion  at LIMA-LAD graft insertion with patent LIMA-LAD.  Large-caliber, patulous RCA with expansive PDA and PL covering a significant portion of the circumflex distribution.  Streaming flow noted, but no significant lesions.  Mechanical aortic valve not crossed.   RECOMMENDATIONS  Need to evaluate for other causes of chest pain-consider possible pericarditis-he does mention that is worse with lying down.  2D echocardiogram pending.  Potentially consider colchicine and NSAIDs.  Otherwise continue GM DT    Bryan Lemma, MD  Findings Coronary Findings Diagnostic  Dominance: Right  Left Anterior Descending There is moderate diffuse disease throughout the vessel. After Diag2 - severe Prox LAD lesion is 65% stenosed. The lesion is eccentric. Mid LAD-1 lesion is 65% stenosed. The lesion is eccentric. Mid LAD-2 lesion is 100% stenosed. The lesion is chronically occluded.  First Diagonal Branch Vessel is moderate in size. The vessel exhibits minimal luminal irregularities.  First Septal Branch  Second Diagonal Branch Vessel is large in size. There is mild disease in the vessel.  Lateral Second Diagonal Branch Vessel is moderate in size.  Second Septal Branch  Third Diagonal Branch Vessel is small in size.  Right Coronary Artery Vessel was injected. Vessel is large. Very large-patulous vessel with streaming flow The vessel exhibits minimal luminal irregularities. Somewhat anterior takeoff with initial downward flow  Acute Marginal Branch Vessel is small in size.  Right Ventricular Branch Vessel  is small in size.  Right Posterior Descending Artery Ost RPDA lesion is 100% stenosed. The lesion is chronically occluded.  Right Posterior Atrioventricular Artery Vessel is large in size.  Second Right Posterolateral Branch Vessel is moderate in size.  Third Right Posterolateral Branch Vessel is large in size.  Sequential Jump Graft Graft To RPDA, RPAV Seq SVG- rPDA-PL graft  was not visualized due to known occlusion. Origin to Insertion lesion before RPDA  is 100% stenosed. The lesion is chronically occluded and heavily thrombotic.  Saphenous Graft To Ost 2nd Diag SVG graft was visualized by angiography and is large. Origin lesion is 100% stenosed. The lesion is chronically occluded.  LIMA LIMA Graft To Mid LAD LIMA graft was visualized by angiography and is moderate in size. There is competitive flow. minimal Fills the small-moderate caliber distal LAD that does not reach the apex  Intervention  No interventions have been documented.   CARDIAC CATHETERIZATION  CARDIAC CATHETERIZATION 04/21/2017  Narrative Images from the original result were not included.   Prox LAD to Mid LAD lesion is 65% stenosed.  Mid LAD-1 lesion is 65% stenosed. Mid LAD-2 lesion is 100% stenosed. There is competitive flow.  LIMA-dLAD is moderate in size.  SVG-Diag2 graft was visualized by angiography and is large. Origin lesion is 100% stenosed.  Ost RPDA to RPDA lesion is 100% stenosed.  Seq SVG- rPDA-PL graft was visualized by angiography and is very large. Origin lesion before RPDA is 99% stenosed. Prox Graft to Insertion lesion before RPDA is 100% stenosed.  Severe native CAD involving mostly the LAD.  Unfortunately the grafted PDA is now occluded along with the sequential SVG-PDA-PL. Not unexpectedly, the SVG-Diag1 is occluded. The findings correlate directly with the stress test findings of inferior infarct and anterior infarct. In the absence of ongoing anginal symptoms, I would not attempt to try PCI of the chronically occluded PDA.  The minimal segment of LAD compromising a septal perforator has not changed since bypass.  Likely not a major issue.  I suspect that the occluded grafts were from prior to 2013.  Nothing appears to be acute.   Plan:  Medical management including restarting amlodipine.  We will defer treatment of atrial fibrillation to elective  physiology, he maintained sinus rhythm currently.  Restart warfarin tonight with Lovenox bridging per protocol.  Discharge home after bed rest    Bryan Lemma, M.D., M.S. Interventional Cardiologist  Pager # [phone] Phone # [phone] [address] [place], [place] [address]  Findings Coronary Findings Diagnostic  Dominance: Right  Left Anterior Descending There is moderate diffuse disease throughout the vessel. After Diag2 - severe Prox LAD to Mid LAD lesion is 65% stenosed. The lesion is eccentric. Mid LAD-1 lesion is 65% stenosed. The lesion is eccentric. Mid LAD-2 lesion is 100% stenosed. The lesion is chronically occluded.  First Diagonal Branch Vessel is moderate in size. The vessel exhibits minimal luminal irregularities.  First Septal Branch Vessel is small in size.  Second Diagonal Branch Vessel is large in size. There is mild disease in the vessel.  Lateral Second Diagonal Branch Vessel is moderate in size.  Second Septal Branch  Third Diagonal Branch Vessel is small in size.  Right Coronary Artery  Acute Marginal Branch Vessel is moderate in size.  Right Posterior Descending Artery Ost RPDA to RPDA lesion is 100% stenosed. The lesion is chronically occluded. Previously Grafted  Right Posterior Atrioventricular Artery Vessel is large in size.  Second Right Posterolateral Branch Vessel is moderate  in size.  Sequential Jump Graft Graft To RPDA, RPAV Seq SVG- rPDA-PL graft was visualized by angiography and is very large. The graft exhibits severe diffuse disease. The graft is severely ectatic. Origin lesion before RPDA  is 99% stenosed. The lesion is focal and concentric. Prox Graft to Insertion lesion before RPDA  is 100% stenosed. The lesion is chronically occluded and heavily thrombotic.  Saphenous Graft To Ost 2nd Diag SVG graft was visualized by angiography and is large. Origin lesion is 100% stenosed. The lesion is  chronically occluded.  LIMA LIMA Graft To Mid LAD LIMA and is moderate in size. There is competitive flow. minimal Fills the small-moderate caliber distal LAD that does not reach the apex  Intervention  No interventions have been documented.   STRESS TESTS  MYOCARDIAL PERFUSION IMAGING 10/28/2020  Narrative  The left ventricular ejection fraction is mildly decreased (45-54%).  Nuclear stress EF: 48%.  There was no ST segment deviation noted during stress.  No T wave inversion was noted during stress.  Defect 1: There is a medium defect of severe severity present in the basal inferolateral and mid inferolateral location.  Defect 2: There is a medium defect of moderate severity present in the mid anterior, apical anterior and apex location.  Findings consistent with prior myocardial infarction with peri-infarct ischemia.  This is an intermediate risk study.  Intermediate risk stress test, with EF mildly reduced and wall motion abnormalities as noted. There are two defects that are similar to prior; the inferolateral defect appears fixed and consistent with scar, and the anterior defect is consistent with infarct with peri-infarct ischemia.   ECHOCARDIOGRAM  ECHOCARDIOGRAM COMPLETE 11/11/2020  Narrative ECHOCARDIOGRAM REPORT    Patient Name:   DENNYS HAWKINS Date of Exam: 11/11/2020 Medical Rec #:  [id number]         Height:       69.0 in Accession #:    [id number]        Weight:       205.1 lb Date of Birth:  [birth year]          BSA:          2.088 m Patient Age:    80 years          BP:           117/67 mmHg Patient Gender: M                 HR:           78 bpm. Exam Location:  Inpatient  Procedure: 2D Echo, Cardiac Doppler and Color Doppler  Indications:    Acute myocardial infarction, unspecified I21.9  History:        Patient has prior history of Echocardiogram examinations, most recent 11/27/2018. Prior CABG, COPD, Arrythmias:Atrial Fibrillation; Risk  Factors:Hypertension, Dyslipidemia and Former Smoker. GERD. Bentall procedure. Aortic Valve: 25 mm bileaflet valve is present in the aortic position. Procedure Date: 12/2002.  Sonographer:    Renella Cunas RDCS Referring Phys: [id number] CARRIEL T NIPP  IMPRESSIONS   1. Left ventricular ejection fraction, by estimation, is 45 to 50%. The left ventricle has mildly decreased function. The left ventricle demonstrates regional wall motion abnormalities (see scoring diagram/findings for description). The left ventricular internal cavity size was mildly dilated. Left ventricular diastolic parameters are indeterminate. 2. Right ventricular systolic function is moderately reduced. The right ventricular size is moderately enlarged. There is normal pulmonary artery systolic pressure. 3. Left atrial size was moderately dilated. 4.  Right atrial size was moderately dilated. 5. The mitral valve is normal in structure. Trivial mitral valve regurgitation. No evidence of mitral stenosis. Moderate mitral annular calcification. 6. The aortic valve has been repaired/replaced. Aortic valve regurgitation is not visualized. There is a 25 mm bileaflet valve present in the aortic position. Procedure Date: 12/2002. Echo findings are consistent with normal structure and function of the aortic valve prosthesis. Aortic valve mean gradient measures 6.0 mmHg. 7. The inferior vena cava is normal in size with greater than 50% respiratory variability, suggesting right atrial pressure of 3 mmHg.  Comparison(s): Prior images reviewed side by side.  Conclusion(s)/Recommendation(s): LVEF similar, with wall motion abnormalities predominantly inferolateral, but there is also a small area of the anteroseptum with focal WMA. On review, RV borderline enlarged on prior study, appears moderately enlarged on current study. Mechanical AVR without significant abnormalities.  FINDINGS Left Ventricle: Left ventricular ejection fraction, by  estimation, is 45 to 50%. The left ventricle has mildly decreased function. The left ventricle demonstrates regional wall motion abnormalities. The left ventricular internal cavity size was mildly dilated. There is no left ventricular hypertrophy. Left ventricular diastolic parameters are indeterminate.   LV Wall Scoring: The mid and distal lateral wall, posterior wall, mid anteroseptal segment, mid anterolateral segment, and apex are hypokinetic. The entire anterior wall, inferior septum, entire inferior wall, basal anteroseptal segment, and basal anterolateral segment are normal.  Right Ventricle: The right ventricular size is moderately enlarged. Right vetricular wall thickness was not well visualized. Right ventricular systolic function is moderately reduced. There is normal pulmonary artery systolic pressure. The tricuspid regurgitant velocity is 2.35 m/s, and with an assumed right atrial pressure of 3 mmHg, the estimated right ventricular systolic pressure is 25.1 mmHg.  Left Atrium: Left atrial size was moderately dilated.  Right Atrium: Right atrial size was moderately dilated.  Pericardium: There is no evidence of pericardial effusion.  Mitral Valve: The mitral valve is normal in structure. There is mild thickening of the mitral valve leaflet(s). There is mild calcification of the mitral valve leaflet(s). Moderate mitral annular calcification. Trivial mitral valve regurgitation. No evidence of mitral valve stenosis.  Tricuspid Valve: The tricuspid valve is normal in structure. Tricuspid valve regurgitation is trivial. No evidence of tricuspid stenosis.  Aortic Valve: The aortic valve has been repaired/replaced. Aortic valve regurgitation is not visualized. Aortic valve mean gradient measures 6.0 mmHg. Aortic valve peak gradient measures 11.5 mmHg. Aortic valve area, by VTI measures 2.01 cm. There is a 25 mm bileaflet valve present in the aortic position. Procedure Date: 12/2002.  Echo findings are consistent with normal structure and function of the aortic valve prosthesis.  Pulmonic Valve: The pulmonic valve was grossly normal. Pulmonic valve regurgitation is trivial. No evidence of pulmonic stenosis.  Aorta: The aortic root, ascending aorta, aortic arch and descending aorta are all structurally normal, with no evidence of dilitation or obstruction.  Venous: The inferior vena cava is normal in size with greater than 50% respiratory variability, suggesting right atrial pressure of 3 mmHg.  IAS/Shunts: The atrial septum is grossly normal.   LEFT VENTRICLE PLAX 2D LVIDd:         5.20 cm LVIDs:         4.30 cm LV PW:         1.00 cm LV IVS:        1.10 cm LVOT diam:     2.50 cm LV SV:         65 LV SV Index:  31 LVOT Area:     4.91 cm  LV Volumes (MOD) LV vol d, MOD A2C: 120.0 ml LV vol d, MOD A4C: 118.0 ml LV vol s, MOD A2C: 43.3 ml LV vol s, MOD A4C: 54.2 ml LV SV MOD A2C:     76.7 ml LV SV MOD A4C:     118.0 ml LV SV MOD BP:      74.0 ml  RIGHT VENTRICLE RV S prime:     5.33 cm/s TAPSE (M-mode): 1.4 cm  LEFT ATRIUM           Index       RIGHT ATRIUM           Index LA diam:      6.70 cm 3.21 cm/m  RA Area:     24.50 cm LA Vol (A2C): 73.8 ml 35.34 ml/m RA Volume:   81.20 ml  38.88 ml/m LA Vol (A4C): 83.8 ml 40.13 ml/m AORTIC VALVE AV Area (Vmax):    1.73 cm AV Area (Vmean):   1.75 cm AV Area (VTI):     2.01 cm AV Vmax:           169.50 cm/s AV Vmean:          115.000 cm/s AV VTI:            0.323 m AV Peak Grad:      11.5 mmHg AV Mean Grad:      6.0 mmHg LVOT Vmax:         59.60 cm/s LVOT Vmean:        41.000 cm/s LVOT VTI:          0.132 m LVOT/AV VTI ratio: 0.41  AORTA Ao Asc diam:  3.00 cm Ao Desc diam: 2.70 cm  TRICUSPID VALVE TR Peak grad:   22.1 mmHg TR Vmax:        235.00 cm/s  SHUNTS Systemic VTI:  0.13 m Systemic Diam: 2.50 cm  Jodelle Red MD Electronically signed by Jodelle Red  MD Signature Date/Time: 11/11/2020/11:35:10 AM    Final    MONITORS  LONG TERM MONITOR (3-14 DAYS) 12/15/2020  Narrative  Predominant rhythm is sinus with first-degree AV block. Minimum heart rate 55 bpm, maximum 139 bpm with an average of 74 bpm.  1 6 beat run of VT at a rate of 126 bpm.  859 bursts of PAT/SVT: Fastest was 4 beats at a rate of 171 bpm. Longest was 4 minutes at a rate of 118 bpm.-No patient diary triggers.  Rare isolated PACs and PVCs within the more rare couplets and triplets. Short spells of ventricular bigeminy and trigeminy noted.  No obvious atrial fibrillation noted.   Patch Wear Time:  14 days and 0 hours (2022-07-22T15:05:05-398 to 2022-08-05T15:05:09-398)  Berry insulin monitor results with a lot of little bursts of premature atrial tachycardia that is probably a harbinger of A. fib.  Thankfully, there were no episodes longer than 4 minutes noted.  Not sure how and as needed plan of amiodarone work in this situation.  It may be that we need to think about just doing standing antiarrhythmic agent and potentially cutting back beta-blocker.  We will need to discuss with my EP colleagues to figure out a good treatment option.    Bryan Lemma, MD            Current Reported Medications:.    Current Meds  Medication Sig   amiodarone (PACERONE) 200 MG tablet Take 400 mg (2 x 200  mg) twice daily for 5 days; if not converted then 200 mg a day for 5 more day as needed for atrial fibrillation   ampicillin (PRINCIPEN) 500 MG capsule Take 500 mg by mouth every evening.   betamethasone dipropionate (DIPROLENE) 0.05 % ointment Apply 1 application topically 2 (two) times daily as needed (psoriasis).   Cyanocobalamin (RA VITAMIN B12) 2000 MCG TBCR Take 2,000 mcg by mouth in the morning.   DENTA 5000 PLUS 1.1 % CREA dental cream Place 1 application onto teeth at bedtime.   esomeprazole (NEXIUM) 20 MG capsule Take 20 mg by mouth in the morning.   ferrous sulfate  325 (65 FE) MG tablet Take 325 mg by mouth every evening.   finasteride (PROSCAR) 5 MG tablet Take 5 mg by mouth daily.   LORazepam (ATIVAN) 0.5 MG tablet Take 0.5 mg by mouth in the morning, at noon, and at bedtime.   metroNIDAZOLE (METROGEL) 1 % gel Apply 1 application topically daily as needed (roasacea).   nitroGLYCERIN (NITROSTAT) 0.4 MG SL tablet Place 1 tablet (0.4 mg total) under the tongue every 5 (five) minutes as needed for chest pain.   potassium chloride SA (KLOR-CON M) 20 MEQ tablet Take 1 tablet (20 mEq total) by mouth daily.   ranolazine (RANEXA) 500 MG 12 hr tablet TAKE 1 TABLET TWICE DAILY   rosuvastatin (CRESTOR) 20 MG tablet TAKE 1 TABLET EVERY DAY   tamsulosin (FLOMAX) 0.4 MG CAPS capsule Take 0.8 mg by mouth at bedtime.   Tiotropium Bromide-Olodaterol (STIOLTO RESPIMAT) 2.5-2.5 MCG/ACT AERS Inhale 2 puffs into the lungs in the morning.   warfarin (COUMADIN) 5 MG tablet TAKE 1 AND 1/2 TABLETS TO 2 TABLETS DAILY AS DIRECTED BY COUMADIN CLINIC   [DISCONTINUED] carvedilol (COREG) 3.125 MG tablet Take 3.125 mg by mouth 2 (two) times daily with a meal.   [DISCONTINUED] enoxaparin (LOVENOX) 100 MG/ML injection Inject 1 mL (100 mg total) into the skin every 12 (twelve) hours.   [DISCONTINUED] furosemide (LASIX) 20 MG tablet TAKE 1 TO 2 TABLETS EVERY DAY IF NEEDED   [DISCONTINUED] Magnesium 200 MG TABS Take 200 mg by mouth daily.   [DISCONTINUED] metoprolol succinate (TOPROL XL) 25 MG 24 hr tablet Take 0.5 tablets (12.5 mg total) by mouth daily.   [DISCONTINUED] spironolactone (ALDACTONE) 25 MG tablet Take 0.5 tablets (12.5 mg total) by mouth daily.   [DISCONTINUED] valsartan (DIOVAN) 320 MG tablet Take 320 mg by mouth daily.   Physical Exam:    VS:  BP 122/68   Pulse 88   Ht 5\' 9"  (1.753 m)   Wt 163 lb (73.9 kg)   SpO2 95%   BMI 24.07 kg/m    Wt Readings from Last 3 Encounters:  02/28/23 163 lb (73.9 kg)  12/12/22 179 lb (81.2 kg)  10/26/22 179 lb 3.2 oz (81.3 kg)     GEN: Well nourished, well developed in no acute distress NECK: No JVD; No carotid bruits CARDIAC: RRR, aortic click, no rubs, gallops RESPIRATORY:  Diminished lung sounds in bases, otherwise clear bilaterally without rales, wheezing or rhonchi  ABDOMEN: Soft, non-tender, non-distended EXTREMITIES:  No edema; No acute deformity   Asessement and Plan:.    HFrEF/hyponatremia: Echo in 07/2022 indicated LVEF 45%, mildly decreased LV function, normally functioning aortic valve prosthesis, peak gradient 13 mmHg.  He has had 2 recent hospital admissions to [facility]. He was admitted in 01/2023 for hyponatremia, sodium level 113, treated with 3 doses of tolvaptan, he was continued on  salt tablets for five days. Echocardiogram on 01/25/2023 indicated LVEF 35 to 40%. He was readmitted in 02/2023 for elevated INR and fluid overload, noted to have a proBNP of 25,000, and CT of the chest showed bilateral pleural effusions.  On 10/14 he underwent thoracentesis with removal of 930 ml of fluid. He was discharged on carvedilol 3.125 mg twice daily, magnesium oxide 200 mg twice daily, his Lasix was changed to 20 mg daily and his spironolactone was increased to 25 mg daily, he was not restarted on sodium tablets. Today he notes some improvement in his strength and dyspnea, feels he is at his baseline. He is euvolemic and well compensated on exam today, lung sounds are slighlty diminished in the bases, no crackles noted. Encouraged to monitor daily weights, his weight is down 16 lbs since last office visit in 12/2022. Entresto and SGLT2i was previously too high cost. Will continue GDMT consisting of carvedilol 3.125 mg twice daily, spironolactone 25 mg daily, valsartan 320 mg daily and furosemide 20 mg daily.  Of note this is a difficult situation with his sodium level and need for spironolactone and lasix. His last sodium level was 125 on 02/27/23, on discharge 10/16 his sodium was 128. With  improvements in fluid status feel it is more beneficial to continue spironolactone and daily lasix with close monitoring of sodium level. Reviewed ED precautions. Patient has CMETs every two weeks with oncology, allowing for routine monitoring.   CAD: s/p CABG x4 in 2004.  Cath in 2022 showed severe single-vessel CAD with extensive mid to distal LAD disease and distal occlusion at LIMA to LAD graft insertion with patent LIMA to LAD, occluded SVG-PDA/PL, occluded SVG-Diagonal 1. Today he denies chest pain, he feels his dyspnea has been slightly improving since discharge. Reviewed ED precautions. No ASA in the setting of chronic anticoagulation. Continue metoprolol, valsartan, Ranexa and Crestor.  PE: Noted to have elevated D-dimer during admission in 01/2023, CTPA was performed that showed small amount of segmental and subsegmental left lower lobe pulmonary embolus mild bilateral effusions. Repeat CT angio on 02/16/23 indicated no pulmonary embolus. Today reports some improvement in dyspnea since discharge. Continued on Warfarin, denies bleeding problems. Will discuss with Dr. Herbie Baltimore if possible coumadin failure, does not appear his INR was subtherapeutic.   Paroxysmal atrial fibrillation: EKG today indicates sinus rhythm with 1st degree AV block at 88 bpm. He denies recent atrial fibrillation at home, he last took amiodarone one month ago. Continue amiodarone as needed per Dr. Herbie Baltimore, and warfarin per coumadin clinic.  Aortic valve mechanical replacement/Bentall procedure: Echo 07/2022 showed normally functioning aortic valve prosthesis, mean gradient 8 mmHg. Reviewed SBE prophylaxis. On coumadin, followed by coumadin clinic.   Hypertension: Blood pressure well controlled today at 122/68. Continue current antihypertensive regimen.  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  Following with oncology, [facility]  and [facility]. On Ipilimumab and Nivolumab.   Disposition: F/u with Dr. Herbie Baltimore in four weeks.   Signed, Rip Harbour, NP

## 2023-02-28 ENCOUNTER — Ambulatory Visit: Payer: Medicare HMO | Attending: Cardiology | Admitting: Cardiology

## 2023-02-28 ENCOUNTER — Encounter: Payer: Self-pay | Admitting: Physician Assistant

## 2023-02-28 VITALS — BP 122/68 | HR 88 | Ht 69.0 in | Wt 163.0 lb

## 2023-02-28 DIAGNOSIS — E871 Hypo-osmolality and hyponatremia: Secondary | ICD-10-CM

## 2023-02-28 DIAGNOSIS — I2510 Atherosclerotic heart disease of native coronary artery without angina pectoris: Secondary | ICD-10-CM | POA: Diagnosis not present

## 2023-02-28 DIAGNOSIS — I5020 Unspecified systolic (congestive) heart failure: Secondary | ICD-10-CM

## 2023-02-28 DIAGNOSIS — Z952 Presence of prosthetic heart valve: Secondary | ICD-10-CM | POA: Diagnosis not present

## 2023-02-28 DIAGNOSIS — I480 Paroxysmal atrial fibrillation: Secondary | ICD-10-CM | POA: Diagnosis not present

## 2023-02-28 DIAGNOSIS — D6869 Other thrombophilia: Secondary | ICD-10-CM

## 2023-02-28 DIAGNOSIS — Z951 Presence of aortocoronary bypass graft: Secondary | ICD-10-CM

## 2023-02-28 MED ORDER — VALSARTAN 320 MG PO TABS: 320.0000 mg | ORAL_TABLET | Freq: Every day | ORAL | 3 refills | Status: DC

## 2023-02-28 MED ORDER — FUROSEMIDE 20 MG PO TABS: 20.0000 mg | ORAL_TABLET | Freq: Every day | ORAL | 3 refills | Status: DC

## 2023-02-28 MED ORDER — MAGNESIUM 200 MG PO TABS: 200.0000 mg | ORAL_TABLET | Freq: Every day | ORAL | 3 refills | Status: DC

## 2023-02-28 MED ORDER — SPIRONOLACTONE 25 MG PO TABS: 25.0000 mg | ORAL_TABLET | Freq: Every day | ORAL | 3 refills | Status: AC

## 2023-02-28 MED ORDER — CARVEDILOL 3.125 MG PO TABS: 3.1250 mg | ORAL_TABLET | Freq: Two times a day (BID) | ORAL | 3 refills | Status: DC

## 2023-02-28 NOTE — Patient Instructions (Signed)
Medication Instructions:  No changes *If you need a refill on your cardiac medications before your next appointment, please call your pharmacy*   Lab Work: No labs If you have labs (blood work) drawn today and your tests are completely normal, you will receive your results only by: MyChart Message (if you have MyChart) OR A paper copy in the mail If you have any lab test that is abnormal or we need to change your treatment, we will call you to review the results.   Testing/Procedures: No testing   Follow-Up: At [facility], you and your health needs are our priority.  As part of our continuing mission to provide you with exceptional heart care, we have created designated Provider Care Teams.  These Care Teams include your primary Cardiologist (physician) and Advanced Practice Providers (APPs -  Physician Assistants and Nurse Practitioners) who all work together to provide you with the care you need, when you need it.  We recommend signing up for the patient portal called "MyChart".  Sign up information is provided on this After Visit Summary.  MyChart is used to connect with patients for Virtual Visits (Telemedicine).  Patients are able to view lab/test results, encounter notes, upcoming appointments, etc.  Non-urgent messages can be sent to your provider as well.   To learn more about what you can do with MyChart, go to [email or web address].    Your next appointment:   November 27th at 11:40 AM with Bryan Lemma, MD

## 2023-03-01 ENCOUNTER — Other Ambulatory Visit: Payer: Self-pay | Admitting: Cardiology

## 2023-03-04 ENCOUNTER — Ambulatory Visit
Admission: EM | Admit: 2023-03-04 | Discharge: 2023-03-04 | Disposition: A | Discharge status: Home / Self Care | Payer: Medicare HMO | Attending: Family Medicine | Admitting: Family Medicine

## 2023-03-04 DIAGNOSIS — N390 Urinary tract infection, site not specified: Secondary | ICD-10-CM

## 2023-03-04 DIAGNOSIS — Z87440 Personal history of urinary (tract) infections: Secondary | ICD-10-CM | POA: Insufficient documentation

## 2023-03-04 DIAGNOSIS — N400 Enlarged prostate without lower urinary tract symptoms: Secondary | ICD-10-CM | POA: Insufficient documentation

## 2023-03-04 DIAGNOSIS — Z7901 Long term (current) use of anticoagulants: Secondary | ICD-10-CM | POA: Insufficient documentation

## 2023-03-04 DIAGNOSIS — I4819 Other persistent atrial fibrillation: Secondary | ICD-10-CM | POA: Diagnosis not present

## 2023-03-04 DIAGNOSIS — N3001 Acute cystitis with hematuria: Secondary | ICD-10-CM | POA: Insufficient documentation

## 2023-03-04 DIAGNOSIS — Z87891 Personal history of nicotine dependence: Secondary | ICD-10-CM | POA: Diagnosis not present

## 2023-03-04 DIAGNOSIS — Z87438 Personal history of other diseases of male genital organs: Secondary | ICD-10-CM

## 2023-03-04 DIAGNOSIS — I5032 Chronic diastolic (congestive) heart failure: Secondary | ICD-10-CM | POA: Diagnosis not present

## 2023-03-04 LAB — POCT URINALYSIS DIP (MANUAL ENTRY)
Bilirubin, UA: NEGATIVE
Glucose, UA: NEGATIVE mg/dL
Ketones, POC UA: NEGATIVE mg/dL
Nitrite, UA: NEGATIVE
Protein Ur, POC: NEGATIVE mg/dL
Spec Grav, UA: 1.015 (ref 1.010–1.025)
Urobilinogen, UA: 1 U/dL
pH, UA: 6.5 (ref 5.0–8.0)

## 2023-03-04 MED ORDER — NITROFURANTOIN MONOHYD MACRO 100 MG PO CAPS: 100.0000 mg | ORAL_CAPSULE | Freq: Two times a day (BID) | ORAL | 0 refills | Status: DC

## 2023-03-04 NOTE — Discharge Instructions (Addendum)
Take the antibiotic 2 x a day Drink lots of water Check my chart for culture report You will be called if any change in antibiotic is needed

## 2023-03-04 NOTE — ED Provider Notes (Signed)
Erik [facility]    CSN: [id number] Arrival date & time: 03/04/23  [id number]      History   Chief Complaint Chief Complaint  Patient presents with   Dysuria    HPI Erik Meyer is a 82 y.o. male.   HPI  This is a very pleasant 82 year old gentleman who is here for symptoms suggestive of urinary tract infection with he has had recurring urinary tract infections.  He has known BPH.  He takes both Flomax and Proscar.  This month he had a urinary tract infection positive for Klebsiella with systems.  He had to go to an infusion center daily for antibiotic.  He states he felt well after this treatment until the last couple of days.  For 4 days now he has had some dysuria and frequency.  No hematuria, no flank pain, fever or chills Patient is in poor health.  He has heart disease, chronic anticoagulation, chronic angina, general debility, and COPD.  Also squamous cell carcinoma of the lung.  Past Medical History:  Diagnosis Date   Anemia    CAD, multiple vessel 12/2002   Found during preop cath for [facility] AVR procedure; Myoview 5/'13: No ischemia or infarct, EF 53% --> cath December 2018: Proximal LAD 65% followed by mid LAD 65% then 100% occlusion.  Patent LIMA-LAD.  SVG-D2 occluded.  RPDA occluded. Seq SVG-r PDA-PL patent to PDA, but the PDA is occluded as is the proximal native PDA; Similar Findings 11/2020   COPD (chronic obstructive pulmonary disease) (HCC)    Emphysema noted on CT scan.   Diverticulosis    Dyslipidemia, goal LDL below 70    Monitored by PCP. On statin (last labs scan from September 2015: TC 149, TG 127, HDL 55, LDL 69)   ED (erectile dysfunction)    GERD (gastroesophageal reflux disease)    History of SBO (small bowel obstruction) 07/2013   Abdominal surgery with LOA   Hx of irritable bowel syndrome    Hypertension, essential    Lesion of right native kidney 08/20/2013   Abdominal US : 2.4 cm x 1.7 complex lesion in the upper pole the right kidney in  March of 2012 concerning for RCC although enlarging hemorrhagic cyst could have this appearance as well.,    Monoclonal gammopathy of undetermined significance    Paroxysmal-persistent atrial fibrillation (HCC); CHA2DS2-VASc Score 7 03/23/2017   CHA2DS2-VASc (CHF, HTN, CAD, agex2, TIAx2 = 7)-> Most recent episode January 3-5, 2022; unsuccessful cardioversion with amiodarone; -> went to ER-synchronized DCCV x1   Psoriasis    S/P AVR (aortic valve replacement) and aortoplasty 12/2002   Dr. Laneta SimmersZachary George procedure - St. Jude aVR (25 mm prosthesis) with aortic root conduit;; Echo 10/'14: EF 55-60%, Mod Conc LVH, Gr 1 DD, Well Seated AoV Mech Prosthesis with normal P gradients (no stenosis), Mod-Severe LA dilation --> follow-up echo November 2016: Well functioning mechanical valve. Paradoxical septal motion. EF 50-55%. Severe LA dilation. Moderate RA dilation.    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   LIMA-LAD, SVG-D1, SVG- PDA-PLA (along with AVR); SVG-Diag & Seq SVG-rPDA-PL both CTO (2018)    Patient Active Problem List   Diagnosis Date Noted   Venous stasis dermatitis of both lower extremities 12/07/2020   Unstable angina (HCC) 11/10/2020   Coronary artery disease involving native coronary artery of native heart with angina pectoris (HCC) 10/20/2020   Fatigue due to treatment 08/29/2017   Chronotropic incompetence 08/29/2017   Anterior chest wall pain 05/22/2017   Right arm  numbness 05/15/2017   Paroxysmal-persistent atrial fibrillation (HCC); CHA2DS2-VASc Score 7 (CHF, HTN, VascDz/CAD, >75 -2, TIA - 2) 03/23/2017   Abnormal nuclear stress test 03/23/2017   Chronic diastolic heart failure (HCC) 03/23/2017   Obesity (BMI 30-39.9) 03/15/2014   S/P CABG x 4    S/P AVR (aortic valve replacement) and aortoplasty    Coronary artery disease involving coronary bypass graft of native heart with angina pectoris (HCC)    Dyslipidemia, goal LDL below 70    Hypertension, essential    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 JUDE mechanical valve conduit with reimplantation of the coronary arteries   CARDIAC CATHETERIZATION  12/20/2002   diffuse  coronary disease,prior to his BENTALL PROCEDURE   CORONARY ARTERY BYPASS GRAFT  12/26/2002   LIMA to LAD,SVG to diagonal, SVG to PDA and PLA.    CORONARY/GRAFT ANGIOGRAPHY N/A 04/21/2017   Procedure: CORONARY/GRAFT ANGIOGRAPHY;  Surgeon: Marykay Lex, MD;  Location: [facility] INVASIVE CV LAB;  Service: Cardiovascular; Native RCA ~patent to PAV/PL, Native LM-LCx(OM1 & OM2), LAD-D1 & D2 then 100% after SP2). Patent LIMA-dLAD, occluded SeqSVG-RPDA-PL & SVG-D2. --. FINDINGS CORELATED WELL WITH PERFUSION DEFECT SEEN ON MYOVIEW - INFERIOR INFARCT & ANTERIOR INFARCT. --Med Rx   CT-Guided Lung Biopsy  07/22/2021   [facility]   EXERCISE TOLERANCE TEST  09/12/2017    5 METS Baseline ST depression.  No new changes with exercise.  Heart rate went to 141 bpm.  Poor exercise tolerance reaching target heart rate at 2:26-minute,.  No chronotropic Incompetence   LAPAROSCOPIC LYSIS OF ADHESIONS  07/2013   performed in [place] for SBO    LEFT HEART CATH AND CORONARY ANGIOGRAPHY N/A 11/11/2020   Procedure: LEFT HEART CATH AND CORONARY ANGIOGRAPHY;  Surgeon: Marykay Lex, MD;  Location: [facility] INVASIVE CV LAB;  Service: Cardiovascular;   pLAD 65%, mLAd 65% (stable) -> 100% CTO. Patent LIMA-dLAD. Ost rPDA 100% CTO with patent RPL system.. SVG-D2 100%. SVG-rPDA-PL occluded. Native LCx - ~normal.  STABLE FINDINGS (Still correlate with Myoview)   NM MYOVIEW LTD  10/04/2011   09/2011: showed no ischemia;EF52%; 03/2017 - [facility]) HIGH RISK.  Mild LV dilation.  EF 45 to 50%..  Concern for prior infarct in the inferior lateral wall and apical anterior wall consistent with infarct with peri-infarct ischemia. => On review, these findings correlate with cardiac catheter showing inferior infarct and anterior infarct.   NM MYOVIEW LTD  10/28/2020   Equivocal Findings;   EF ~45-50%. No EKG changes. Medium Size - Severe defect - basal inferolateral & mid inferolateral. Also Medium Size-Severe defect in mid Anterior, apical Anterior & Apex. C/w Prior MI with Peri-Infarct Ischemia. - INTERMEDIATE RISK - STABLE FROM 2018   PARTIAL COLECTOMY  1994   THORACIC AORTIC ANEURYSM REPAIR  2004   Saint Jude aortic valve conduit-Bentall AVR   TRANSTHORACIC ECHOCARDIOGRAM  06/21/2021   [facility]) EF 60 to 65%.  Mild diastolic dysfunction.  Normal LAP.  Moderate LA dilation.  Moderate RV dilation.  Moderate-severe RA dilation.  Very mild AI.   TRANSTHORACIC ECHOCARDIOGRAM  11/2018    Stable EF 45-50%-moderate HK of apical-anteroseptal wall.Marland Kitchen  Moderate asymmetric LVH with GR 1 DD.  Normal RV.  RA mildly dilated.  Moderate MAC.   TRANSTHORACIC ECHOCARDIOGRAM  11/11/2020   EF 45 to 50%.  Mildly decreased function.  Inferolateral hypokinesis and small area of anteroseptal wall motion abnormality.  RV is borderline size with moderate dysfunction-normal CVP.Marland Kitchen  Both atria are moderately dilated.  25 mm bileaflet mechanical aortic valve in place.  Normal function and normal gradients..   TRANSTHORACIC ECHOCARDIOGRAM  07/27/2022   [facility]: A)05/2022: Mildly reduced EF 45 to 50%.  Bioprosthetic? 25 mm Saint Jude AVR w/ normal structure and fxn.  Mod TR, mod-severe PulmHTN w/ RAP ~15 mmHg.  Severe biatrial enlargement;; b) 07/27/22:: Mild LVH.  EF 45 to 50%.  Abnormal strain.  Mild HK with abnormal septal motion.  Moderate dilated RV w/mod dysfxn.  BiAE-LA severe, RA mod.  Prosthetic AVR nl Fxn.  P-M gradient 13 & 8 mmHg.   VENTRAL HERNIA REPAIR      repair with mesh by Dr Loreta Ave at [facility] Medications    Prior to Admission medications   Medication Sig Start Date End Date Taking? Authorizing Provider  nitrofurantoin, macrocrystal-monohydrate, (MACROBID) 100 MG capsule Take 1 capsule (100 mg total) by mouth 2 (two) times daily. 03/04/23  Yes Eustace Moore, MD  potassium chloride SA (KLOR-CON M) 20 MEQ tablet TAKE 1 TABLET EVERY DAY (DIRECTIONS CHANGE) 03/02/23   Marykay Lex, MD  amiodarone (PACERONE) 200 MG tablet Take 400 mg (2 x 200 mg) twice daily for 5 days; if not converted then 200 mg a day for 5 more day as needed for atrial fibrillation 12/12/22   Bernadene Person C, NP  ampicillin (PRINCIPEN) 500 MG capsule Take 500 mg by mouth every evening. 09/04/13   [provider]  betamethasone dipropionate (DIPROLENE) 0.05 % ointment Apply 1 application topically 2 (two) times daily as needed (psoriasis). 06/03/20   [provider]  carvedilol (COREG) 3.125 MG tablet Take 1 tablet (3.125 mg total) by mouth 2 (two) times daily with a meal. 02/28/23   Reather Littler D, NP  Cyanocobalamin (RA VITAMIN B12) 2000 MCG TBCR Take 2,000 mcg by mouth in the morning.    [provider]  DENTA 5000 PLUS 1.1 % CREA dental cream Place 1 application onto teeth at bedtime. 06/19/14   [provider]  esomeprazole (NEXIUM) 20 MG capsule Take 20 mg by mouth in the morning.    [provider]  ferrous sulfate 325 (65 FE) MG tablet Take 325 mg by mouth every evening.    [provider]  finasteride (PROSCAR) 5 MG tablet Take 5 mg by mouth daily. 04/19/21   [provider]  furosemide (LASIX) 20 MG tablet Take 1 tablet (20 mg total) by mouth daily. 02/28/23   Reather Littler D, NP  LORazepam (ATIVAN) 0.5 MG tablet Take 0.5 mg by mouth in the morning, at noon, and at bedtime.    [provider]  Magnesium 200 MG TABS Take 1 tablet (200 mg total) by mouth daily. 02/28/23   Reather Littler D, NP  metroNIDAZOLE (METROGEL) 1 % gel Apply 1 application topically daily as needed (roasacea). 05/21/20   [provider]  nitroGLYCERIN (NITROSTAT) 0.4 MG SL tablet Place 1 tablet (0.4 mg total) under the tongue every 5 (five) minutes as needed for chest pain. 07/01/20   Marykay Lex, MD  ranolazine (RANEXA) 500 MG 12 hr tablet TAKE 1 TABLET TWICE  DAILY 08/16/22   Marykay Lex, MD  rosuvastatin (CRESTOR) 20 MG tablet TAKE 1 TABLET EVERY DAY 09/26/22   Marykay Lex, MD  spironolactone (ALDACTONE) 25 MG tablet Take 1 tablet (25 mg total) by mouth daily. 02/28/23 05/29/23  Reather Littler D, NP  tamsulosin (FLOMAX) 0.4 MG CAPS capsule Take 0.8 mg by mouth at bedtime.    [provider]  Tiotropium Bromide-Olodaterol (STIOLTO RESPIMAT) 2.5-2.5 MCG/ACT AERS Inhale 2 puffs into the lungs in the morning.    [provider]  traMADol (ULTRAM) 50 MG tablet PLEASE SEE ATTACHED FOR DETAILED DIRECTIONS Patient not taking: Reported on 02/28/2023    [provider]  valsartan (DIOVAN) 320 MG tablet Take 1 tablet (320 mg total) by mouth daily. 02/28/23   Reather Littler D, NP  warfarin (COUMADIN) 5 MG tablet TAKE 1 AND 1/2 TABLETS TO 2 TABLETS DAILY AS DIRECTED BY COUMADIN CLINIC 07/18/22   Marykay Lex, MD    Family History Family History  Problem Relation Age of Onset   Colon cancer Mother    Congestive Heart Failure Mother        Cardiomyopathy   Lung cancer Father        Was a chronic smoker   COPD Sister        End-stage COPD    Social History Social History   Tobacco Use   Smoking status: Former    Current packs/day: 0.00    Types: Cigarettes    Quit date: 06/16/1997    Years since quitting: 25.7   Smokeless tobacco: Never  Vaping Use   Vaping status: Never Used  Substance Use Topics   Alcohol use: Yes   Drug use: No     Allergies   Duloxetine and Doxycycline   Review of Systems Review of Systems See HPI  Physical Exam Triage  Vital Signs ED Triage Vitals  Encounter Vitals Group     BP 03/04/23 0831 (!) 146/72     Systolic BP Percentile --      Diastolic BP Percentile --      Pulse Rate 03/04/23 0831 62     Resp 03/04/23 0831 15     Temp 03/04/23 0831 (!) 97.5 F (36.4 C)     Temp Source 03/04/23 0831 Oral     SpO2 03/04/23 0831 96 %     Weight --      Height --      Head Circumference --      Peak Flow --      Pain Score 03/04/23 0830 0     Pain Loc --      Pain Education --      Exclude from Growth Chart --    No data found.  Updated Vital Signs BP (!) 146/72 (BP Location: Left Arm)   Pulse 62   Temp (!) 97.5 F (36.4 C) (Oral)   Resp 15   SpO2 96%       Physical Exam Constitutional:      General: He is not in acute distress.    Appearance: He is well-developed and normal weight.     Comments: Uses walker for mobility  HENT:     Head: Normocephalic and atraumatic.  Eyes:     Conjunctiva/sclera: Conjunctivae normal.     Pupils: Pupils are equal, round, and reactive to light.  Cardiovascular:     Rate and Rhythm: Normal rate.  Pulmonary:     Effort: Pulmonary effort is normal. No respiratory  distress.  Abdominal:     General: There is no distension.     Palpations: Abdomen is soft.     Tenderness: There is no right CVA tenderness or left CVA tenderness.  Musculoskeletal:        General: Normal range of motion.     Cervical back: Normal range of motion.     Right lower leg: No edema.     Left lower leg: No edema.  Skin:    General: Skin is warm and dry.  Neurological:     Mental Status: He is alert.      UC Treatments / Results  Labs (all labs ordered are listed, but only abnormal results are displayed) Labs Reviewed  POCT URINALYSIS DIP (MANUAL ENTRY) - Abnormal; Notable for the following components:      Result Value   Clarity, UA cloudy (*)    Blood, UA trace-intact (*)    Leukocytes, UA Large (3+) (*)    All other components within normal limits  URINE CULTURE     EKG   Radiology No results found.  Procedures Procedures (including critical care time)  Medications Ordered in UC Medications - No data to display  Initial Impression / Assessment and Plan / UC Course  I have reviewed the triage vital signs and the nursing notes.  Pertinent labs & imaging results that were available during my care of the patient were reviewed by me and considered in my medical decision making (see chart for details).     I am limited in antibiotic choices due to his warfarin anticoagulation.  Ideally would choose Cipro or Bactrim as initial treatment for possible Klebsiella UTI.  Nitrofurantoin does have risks associated in this age range, more so with chronic use than a short course.  Patient knows to check MyChart for culture report, and knows that his antibiotic will be changed if sensitivities indicate Final Clinical Impressions(s) / UC Diagnoses   Final diagnoses:  Recurrent urinary tract infection  History of BPH  Acute cystitis with hematuria     Discharge Instructions      Take the antibiotic 2 x a day Drink lots of water Check my chart for culture report You will be called if any change in antibiotic is needed   ED Prescriptions     Medication Sig Dispense Auth. Provider   nitrofurantoin, macrocrystal-monohydrate, (MACROBID) 100 MG capsule Take 1 capsule (100 mg total) by mouth 2 (two) times daily. 10 capsule Eustace Moore, MD      PDMP not reviewed this encounter.   Eustace Moore, MD 03/04/23 [phone]

## 2023-03-04 NOTE — ED Triage Notes (Signed)
Pt present with c/o dysuria x 4 days. C/o frequency that has gotten better. No other sxs.

## 2023-03-06 ENCOUNTER — Telehealth: Payer: Self-pay | Admitting: *Deleted

## 2023-03-06 ENCOUNTER — Ambulatory Visit (INDEPENDENT_AMBULATORY_CARE_PROVIDER_SITE_OTHER): Payer: Medicare HMO | Admitting: Cardiovascular Disease

## 2023-03-06 DIAGNOSIS — Z952 Presence of prosthetic heart valve: Secondary | ICD-10-CM | POA: Diagnosis not present

## 2023-03-06 DIAGNOSIS — Z5181 Encounter for therapeutic drug level monitoring: Secondary | ICD-10-CM

## 2023-03-06 LAB — POCT INR: INR: 5.6 — AB (ref 2.0–3.0)

## 2023-03-06 LAB — URINE CULTURE: Culture: >=100000 — AB

## 2023-03-06 NOTE — Telephone Encounter (Signed)
Called pt since INR is due. He states he will perform it in a few. Will await and follow up.

## 2023-03-06 NOTE — Telephone Encounter (Signed)
TC to Pt to report Provider is being contacted. Wil call patient back .

## 2023-03-07 ENCOUNTER — Telehealth: Payer: Self-pay | Admitting: Cardiology

## 2023-03-07 NOTE — Telephone Encounter (Signed)
Please let Erik Meyer know that I discussed his recent hospitalizations with Dr. Herbie Baltimore. He agrees with continuing spironolactone. He did recommend that you continue lasix 20 mg daily with increase for weight gain as noted below.  If you gain more than 3 pounds from dry weight: Increase the Lasix dosing to 20 mg in the morning and 20 mg in the afternoon until weight returns to baseline dry weight.

## 2023-03-07 NOTE — Telephone Encounter (Signed)
Called Patient on the phone they asked me to send the directions through their mychart. Sent to [employer] and advised of below.

## 2023-03-13 ENCOUNTER — Telehealth: Payer: Self-pay

## 2023-03-13 NOTE — Telephone Encounter (Signed)
Patient will check INR on Tuesday 11/5

## 2023-03-14 ENCOUNTER — Ambulatory Visit (INDEPENDENT_AMBULATORY_CARE_PROVIDER_SITE_OTHER): Payer: Medicare HMO | Admitting: Cardiovascular Disease

## 2023-03-14 DIAGNOSIS — Z5181 Encounter for therapeutic drug level monitoring: Secondary | ICD-10-CM

## 2023-03-14 DIAGNOSIS — Z952 Presence of prosthetic heart valve: Secondary | ICD-10-CM | POA: Diagnosis not present

## 2023-03-14 LAB — POCT INR: INR: 4.8 — AB (ref 2.0–3.0)

## 2023-03-21 ENCOUNTER — Ambulatory Visit (INDEPENDENT_AMBULATORY_CARE_PROVIDER_SITE_OTHER): Payer: Self-pay | Admitting: Cardiology

## 2023-03-21 ENCOUNTER — Telehealth: Payer: Self-pay

## 2023-03-21 DIAGNOSIS — Z952 Presence of prosthetic heart valve: Secondary | ICD-10-CM | POA: Diagnosis not present

## 2023-03-21 DIAGNOSIS — Z5181 Encounter for therapeutic drug level monitoring: Secondary | ICD-10-CM

## 2023-03-21 LAB — POCT INR: INR: 3.3 — AB (ref 2.0–3.0)

## 2023-03-21 NOTE — Telephone Encounter (Signed)
Reminded patient to check INR ?

## 2023-03-28 ENCOUNTER — Ambulatory Visit (INDEPENDENT_AMBULATORY_CARE_PROVIDER_SITE_OTHER): Payer: Medicare HMO | Admitting: Cardiology

## 2023-03-28 DIAGNOSIS — Z5181 Encounter for therapeutic drug level monitoring: Secondary | ICD-10-CM

## 2023-03-28 DIAGNOSIS — Z952 Presence of prosthetic heart valve: Secondary | ICD-10-CM

## 2023-03-28 LAB — POCT INR: INR: 5.3 — AB (ref 2.0–3.0)

## 2023-03-29 ENCOUNTER — Other Ambulatory Visit: Payer: Self-pay | Admitting: Cardiology

## 2023-03-29 DIAGNOSIS — Z5181 Encounter for therapeutic drug level monitoring: Secondary | ICD-10-CM

## 2023-03-30 NOTE — Telephone Encounter (Signed)
Warfarin 5mg  refill Afib Last INR 03/21/23 Last OV 02/28/23

## 2023-04-04 ENCOUNTER — Ambulatory Visit (INDEPENDENT_AMBULATORY_CARE_PROVIDER_SITE_OTHER): Payer: Medicare HMO | Admitting: *Deleted

## 2023-04-04 DIAGNOSIS — Z5181 Encounter for therapeutic drug level monitoring: Secondary | ICD-10-CM | POA: Diagnosis not present

## 2023-04-04 DIAGNOSIS — I480 Paroxysmal atrial fibrillation: Secondary | ICD-10-CM

## 2023-04-04 DIAGNOSIS — Z952 Presence of prosthetic heart valve: Secondary | ICD-10-CM

## 2023-04-04 DIAGNOSIS — D6869 Other thrombophilia: Secondary | ICD-10-CM | POA: Diagnosis not present

## 2023-04-04 LAB — POCT INR: INR: 5.3 — AB (ref 2.0–3.0)

## 2023-04-04 NOTE — Patient Instructions (Signed)
Description   Called and spoke to pt and instructed him to not take any warfarin today and no warfarin tomorrow then START taking 1 tablet (5 mg) daily except 1/2 tablet on Thursdays. Recheck INR in 1 week. Coumadin Clinic [phone].

## 2023-04-05 ENCOUNTER — Ambulatory Visit: Payer: Medicare HMO | Admitting: Cardiology

## 2023-04-05 NOTE — Progress Notes (Unsigned)
Cardiology Office Note:  .   Date:  04/05/2023  ID:  Erik Meyer, DOB 1940/09/06, MRN [id number] PCP: Penelope Galas, MD  [facility] Providers Cardiologist:  Bryan Lemma, MD { }   History of Present Illness: .   Erik Meyer is a 82 y.o. 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 Thoracentesis 02/20/2023 due to bilateral pleural effusions. Was last seen by Caesar Chestnut on 02/28/2023 and was stable from CV standpoint.   ROS: ***  Studies Reviewed: Marland Kitchen     LHC  11/11/2020 Prox LAD lesion is 65% stenosed. Mid LAD-1 lesion is 65% stenosed. Stable in appearance from previous cath Mid LAD-2 lesion is 100% stenosed -> at LIMA graft insertion site with better flow Ost RPDA lesion is 100% stenosed. ---GRAFTS--- LIMA graft was visualized by angiography and is moderate in size with no notable disease.. There is competitive flow at the insertion site. Seq SVG- rPDA-PL graft was not visualized due to known occlusion. SVG-DIAG graft was visualized by angiography and is 100% stenosed @ the origin. ------- ---> Normal functioning Prosthetic Bileaflet Aortic Valve  Echocardiogram 11/11/2020  1. Left ventricular ejection fraction, by estimation, is 45 to 50%. The  left ventricle has mildly decreased function. The left ventricle  demonstrates regional wall motion abnormalities (see scoring  diagram/findings for description). The left ventricular   internal cavity size was mildly dilated. Left ventricular diastolic  parameters are indeterminate.   2. Right ventricular systolic function is moderately reduced. The right  ventricular size is moderately enlarged. There is normal pulmonary artery   systolic pressure.   3. Left atrial size was moderately dilated.   4. Right atrial size was moderately dilated.   5. The mitral valve is normal in structure. Trivial mitral valve  regurgitation. No evidence of mitral stenosis. Moderate mitral annular  calcification.   6. The aortic valve has been repaired/replaced. Aortic valve  regurgitation is not visualized. There is a 25 mm bileaflet valve present  in the aortic position. Procedure Date: 12/2002. Echo findings are  consistent with normal structure and function of  the aortic valve prosthesis. Aortic valve mean gradient measures 6.0 mmHg.   7. The inferior vena cava is normal in size with greater than 50%  respiratory variability, suggesting right atrial pressure of 3 mmHg.   *** EKG Interpretation Date/Time:    Ventricular Rate:    PR Interval:    QRS Duration:    QT Interval:    QTC Calculation:   R Axis:      Text Interpretation:      Physical Exam:   VS:  There were no vitals taken for this visit.   Wt Readings from Last 3 Encounters:  02/28/23 163 lb (73.9 kg)  12/12/22 179 lb (81.2 kg)  10/26/22 179 lb 3.2 oz (81.3 kg)    GEN: Well nourished, well developed in no acute distress NECK: No JVD; No carotid bruits CARDIAC: ***RRR, no murmurs, rubs, gallops RESPIRATORY:  Clear to auscultation without rales, wheezing or rhonchi  ABDOMEN: Soft, non-tender, non-distended EXTREMITIES:  No edema; No deformity   ASSESSMENT AND PLAN: .   ***    {Are you ordering a CV Procedure (e.g. stress test, cath, DCCV, TEE, etc)?   Press F2        :  [id number]}    Leeroy Bock. Liborio Nixon, ANP, AACC

## 2023-04-10 ENCOUNTER — Ambulatory Visit: Payer: Medicare HMO | Admitting: Adult Health

## 2023-04-11 ENCOUNTER — Ambulatory Visit (INDEPENDENT_AMBULATORY_CARE_PROVIDER_SITE_OTHER): Payer: Medicare HMO | Admitting: Cardiology

## 2023-04-11 ENCOUNTER — Telehealth: Payer: Self-pay

## 2023-04-11 DIAGNOSIS — Z952 Presence of prosthetic heart valve: Secondary | ICD-10-CM | POA: Diagnosis not present

## 2023-04-11 DIAGNOSIS — Z5181 Encounter for therapeutic drug level monitoring: Secondary | ICD-10-CM

## 2023-04-11 LAB — POCT INR: INR: 8 — AB (ref 2.0–3.0)

## 2023-04-11 NOTE — Telephone Encounter (Signed)
I spoke to patient and he verified that he is not taking Amiodarone presently and hasn't in months.

## 2023-04-14 ENCOUNTER — Ambulatory Visit (INDEPENDENT_AMBULATORY_CARE_PROVIDER_SITE_OTHER): Payer: Medicare HMO | Admitting: Cardiology

## 2023-04-14 DIAGNOSIS — Z952 Presence of prosthetic heart valve: Secondary | ICD-10-CM | POA: Diagnosis not present

## 2023-04-14 DIAGNOSIS — Z5181 Encounter for therapeutic drug level monitoring: Secondary | ICD-10-CM | POA: Diagnosis not present

## 2023-04-14 LAB — POCT INR: INR: 2.6 (ref 2.0–3.0)

## 2023-04-17 NOTE — Progress Notes (Unsigned)
Cardiology Office Note    Date:  04/17/2023  ID:  Erik Meyer, DOB [birth year], MRN [id number] PCP:  Penelope Galas, MD  Cardiologist:  Bryan Lemma, MD  Electrophysiologist:  None   Chief Complaint: ***  History of Present Illness: .    Erik Meyer is a 82 y.o. 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    In 2004 he underwent aortic mechanical valve replacement/Bentall procedure at the time of his CABG.  Cardiac catheterization in 2018 revealed severe native CAD involving most of the LAD, occluded SVG to PDA along with sequential SVG to PDA/PL.  SVG to diagonal 1 was also occluded.  Myoview in 2020 findings consistent with prior MI.  Follow-up cardiac catheterization in 11/2020 showed severe single-vessel CAD with extensive mid to distal LAD disease distal occlusion at LIMA to LAD graft insertion with patent LIMA to LAD, occluded SVG to PDA/PL, clued SVG to diagonal 1.  Cardiac monitor in 12/2020 showed predominantly sinus rhythm, first-degree AV block, NSVT, rare PACs and PVCs, premature atrial tachycardia.    In March 2024 he was hospitalized at [facility] in [place] in the setting of acute on chronic heart failure.  Echocardiogram revealed EF 45%, mildly decreased LV function, elevated LA pressure, moderate RVE/RAE, normally functioning aortic valve prosthesis, peak gradient 13 mmHg.  He was previously transition from valsartan to [place] and started on Farxiga, unfortunately both Entresto and Farxiga were cost prohibitive and subsequently discontinued.  He was last seen in clinic on 12/12/2022, he had remained stable from a cardiac standpoint.  He noted increased shortness of breath since starting  chemotherapy for recurrent lung cancer.  He was noted to be in atrial fibrillation although he remained cardiac unaware.   On 01/24/2023 he presented to [facility] with abnormal sodium on routine blood work, his sodium level was 113.  Patient reported that his Lasix had been discontinued previously secondary to hyponatremia.  He reported increased swelling in his abdomen as well as legs.  His D-dimer was elevated so a CT PE was performed that showed small amount of segmental and subsegmental left lower lobe pulmonary embolus mild bilateral effusions.  Echocardiogram on 01/25/2023 indicated LVEF 35 to 40%, his warfarin was continued given PE and afib.  His hyponatremia was treated with 3 doses of tolvaptan, he was continued on salt tablets at discharge.   On 02/16/23 patient presented via EMS to [facility] with increased shortness of breath and elevated INR.  In the ED he was noted to have a proBNP of 25,000, and CT of the chest showed bilateral pleural effusions.  On 10/14 he underwent thoracentesis with removal of 930 ml of serous fluid, no malignancy identified, no growth on cultures.  He was discharged on Lovenox bridging, carvedilol 3.125 mg twice daily, magnesium oxide 200 mg twice daily, his Lasix was changed to 20 mg daily and his spironolactone was increased to 25 mg daily.  He was last seen in clinic on 02/28/2023 for hospital follow-up, he felt that he had been back to his baseline.  He noted that his strength and breathing had improved since being discharged.  He was continued on spironolactone 25 mg daily and Lasix with close monitoring of his sodium level at his oncology infusions.  HFrEF/hyponatremia: Echo  in 07/2022 indicated LVEF 45%, mildly decreased LV function, normally functioning aortic valve prosthesis, peak gradient 13 mmHg.  He has had 2 recent hospital admissions to [facility]. He was admitted in 01/2023 for hyponatremia, sodium  level 113, treated with 3 doses of tolvaptan, he was continued on salt tablets for five days. Echocardiogram on 01/25/2023 indicated LVEF 35 to 40%. He was readmitted in 02/2023 for elevated INR and fluid overload, noted to have a proBNP of 25,000, and CT of the chest showed bilateral pleural effusions.  On 10/14 he underwent thoracentesis with removal of 930 ml of fluid. He was discharged on carvedilol 3.125 mg twice daily, magnesium oxide 200 mg twice daily, his Lasix was changed to 20 mg daily and his spironolactone was increased to 25 mg daily, he was not restarted on sodium tablets. Today he notes some improvement in his strength and dyspnea, feels he is at his baseline. He is euvolemic and well compensated on exam today, lung sounds are slighlty diminished in the bases, no crackles noted. Encouraged to monitor daily weights, his weight is down 16 lbs since last office visit in 12/2022. Entresto and SGLT2i was previously too high cost. Will continue GDMT consisting of carvedilol 3.125 mg twice daily, spironolactone 25 mg daily, valsartan 320 mg daily and furosemide 20 mg daily.  Of note this is a difficult situation with his sodium level and need for spironolactone and lasix. His last sodium level was 125 on 02/27/23, on discharge 10/16 his sodium was 128. With improvements in fluid status feel it is more beneficial to continue spironolactone and daily lasix with close monitoring of sodium level. Reviewed ED precautions. Patient has CMETs every two weeks with oncology, allowing for routine monitoring.    CAD: s/p CABG x4 in 2004.  Cath in 2022 showed severe single-vessel CAD with extensive mid to distal LAD disease and distal occlusion at LIMA to LAD graft insertion with patent LIMA to LAD, occluded SVG-PDA/PL, occluded SVG-Diagonal  Today he denies chest pain, he feels his dyspnea has been slightly improving since discharge. Reviewed ED precautions. No ASA in the setting of chronic anticoagulation. Continue  metoprolol, valsartan, Ranexa and Crestor.   PE: Noted to have elevated D-dimer during admission in 01/2023, CTPA was performed that showed small amount of segmental and subsegmental left lower lobe pulmonary embolus mild bilateral effusions. Repeat CT angio on 02/16/23 indicated no pulmonary embolus.  Today reports some improvement in dyspnea since discharge. Continued on Warfarin, denies bleeding problems.    Paroxysmal atrial fibrillation: EKG today indicates sinus rhythm with 1st degree AV block at 88 bpm. He denies recent atrial fibrillation at home, he last took amiodarone one month ago. Continue amiodarone as needed per Dr. Herbie Baltimore, and warfarin per coumadin clinic.   Aortic valve mechanical replacement/Bentall procedure: Echo 07/2022 showed normally functioning aortic valve prosthesis, mean gradient 8 mmHg. Reviewed SBE prophylaxis. On coumadin, followed by coumadin clinic.    Hypertension: Blood pressure well controlled today at  Continue current antihypertensive regimen.   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  Following with oncology, [facility] and [facility]. On Ipilimumab and Nivolumab.     Labwork independently reviewed:   ROS: .    Please see the history of present illness. Otherwise, review of systems is positive for ***.  All other systems are reviewed and otherwise negative.  Studies Reviewed: Marland Kitchen    EKG:  EKG is ordered  today, personally reviewed, demonstrating ***  CV Studies: Cardiac studies reviewed are outlined and summarized above. Otherwise please see EMR for full report.   Current Reported Medications:.    No outpatient medications have been marked as taking for the 04/18/23 encounter (Appointment) with Rip Harbour, NP.    Physical Exam:    VS:  There were no vitals taken for this visit.   Wt Readings from Last 3 Encounters:  02/28/23 163 lb  (73.9 kg)  12/12/22 179 lb (81.2 kg)  10/26/22 179 lb 3.2 oz (81.3 kg)    GEN: Well nourished, well developed in no acute distress NECK: No JVD; No carotid bruits CARDIAC: ***RRR, no murmurs, rubs, gallops RESPIRATORY:  Clear to auscultation without rales, wheezing or rhonchi  ABDOMEN: Soft, non-tender, non-distended EXTREMITIES:  No edema; No acute deformity   Asessement and Plan:.     ***     Disposition: F/u with ***  Signed, Rip Harbour, NP

## 2023-04-18 ENCOUNTER — Other Ambulatory Visit: Payer: Self-pay | Admitting: Home Health

## 2023-04-18 ENCOUNTER — Telehealth: Payer: Self-pay

## 2023-04-18 ENCOUNTER — Encounter: Payer: Self-pay | Admitting: Cardiology

## 2023-04-18 ENCOUNTER — Ambulatory Visit: Payer: Medicare HMO | Attending: Cardiology | Admitting: Cardiology

## 2023-04-18 ENCOUNTER — Ambulatory Visit: Payer: Medicare HMO | Attending: Cardiovascular Disease | Admitting: *Deleted

## 2023-04-18 VITALS — BP 118/60 | HR 95 | Ht 68.0 in | Wt 157.0 lb

## 2023-04-18 DIAGNOSIS — I5020 Unspecified systolic (congestive) heart failure: Secondary | ICD-10-CM | POA: Diagnosis not present

## 2023-04-18 DIAGNOSIS — Z951 Presence of aortocoronary bypass graft: Secondary | ICD-10-CM | POA: Diagnosis not present

## 2023-04-18 DIAGNOSIS — D6869 Other thrombophilia: Secondary | ICD-10-CM

## 2023-04-18 DIAGNOSIS — Z952 Presence of prosthetic heart valve: Secondary | ICD-10-CM

## 2023-04-18 DIAGNOSIS — E871 Hypo-osmolality and hyponatremia: Secondary | ICD-10-CM | POA: Diagnosis not present

## 2023-04-18 DIAGNOSIS — I10 Essential (primary) hypertension: Secondary | ICD-10-CM

## 2023-04-18 DIAGNOSIS — I480 Paroxysmal atrial fibrillation: Secondary | ICD-10-CM

## 2023-04-18 DIAGNOSIS — I2510 Atherosclerotic heart disease of native coronary artery without angina pectoris: Secondary | ICD-10-CM

## 2023-04-18 LAB — POCT INR: INR: 8 — AB (ref 2.0–3.0)

## 2023-04-18 MED ORDER — PHYTONADIONE 5 MG PO TABS: 2.5000 mg | ORAL_TABLET | Freq: Once | ORAL | Status: DC

## 2023-04-18 NOTE — Telephone Encounter (Signed)
I spoke to the patient who is having trouble with machine.  I told him to call company to troubleshoot.  He verbalized understanding

## 2023-04-18 NOTE — Progress Notes (Addendum)
Paged by [facility] after hour, INR >10 at 1622 today. Called the patient, he has no active bleeding. [facility], which has no VitK supply. [facility] on [address], confirmed they have VitK supply. [facility] to [facility] at [facility], he is able to drive there. Advised the patient to take Vit K 2.5mg  x1 tonight. He needs to follow up with coumadin clinic tomorrow. Clinic was already aware of him with elevated INR today. Advised the patient go to ER if noted any bleeding.

## 2023-04-18 NOTE — Patient Instructions (Signed)
Medication Instructions:  No changes *If you need a refill on your cardiac medications before your next appointment, please call your pharmacy*  Lab Work: No labs  Testing/Procedures: No testing  Follow-Up: At [facility], you and your health needs are our priority.  As part of our continuing mission to provide you with exceptional heart care, we have created designated Provider Care Teams.  These Care Teams include your primary Cardiologist (physician) and Advanced Practice Providers (APPs -  Physician Assistants and Nurse Practitioners) who all work together to provide you with the care you need, when you need it.  We recommend signing up for the patient portal called "MyChart".  Sign up information is provided on this After Visit Summary.  MyChart is used to connect with patients for Virtual Visits (Telemedicine).  Patients are able to view lab/test results, encounter notes, upcoming appointments, etc.  Non-urgent messages can be sent to your provider as well.   To learn more about what you can do with MyChart, go to [email or web address].    Your next appointment:   2 month(s)  Provider:   Bryan Lemma, MD

## 2023-04-18 NOTE — Telephone Encounter (Signed)
Reminded patient to check INR ?

## 2023-04-19 ENCOUNTER — Other Ambulatory Visit: Payer: Self-pay | Admitting: *Deleted

## 2023-04-19 ENCOUNTER — Telehealth: Payer: Self-pay | Admitting: *Deleted

## 2023-04-19 ENCOUNTER — Telehealth: Payer: Self-pay | Admitting: Cardiology

## 2023-04-19 DIAGNOSIS — I480 Paroxysmal atrial fibrillation: Secondary | ICD-10-CM

## 2023-04-19 LAB — PROTIME-INR
INR: >10 (ref 0.9–1.2)
Prothrombin Time: >90 s — ABNORMAL HIGH (ref 9.1–12.0)

## 2023-04-19 NOTE — Telephone Encounter (Signed)
[facility] calling with critical labs

## 2023-04-19 NOTE — Telephone Encounter (Signed)
Please refer to Anticoagulation Encounter and After Hours note from 04/18/23 and Telephone note from today for detailed instructions regarding dose instructions and the order of Vit K.

## 2023-04-19 NOTE — Patient Instructions (Addendum)
Description   04/19/23-REPORT TO ER WITH ANY BLEEDING, FALLS, ACCIDENTS.  Do not take any warfarin 12/10, no warfarin 12/11, no warfarin 12/12 & Recheck INR Thursday morning at [facility]. Plan: Start taking 1/2 tablet daily. (Previous dose 1/2 tablet Sunday and Thursday) Pt request call around 10am after home from [facility] [phone].

## 2023-04-19 NOTE — Telephone Encounter (Signed)
Spoke with pt and he states he did take Vit k 2.5mg  last night at 9pm. He will be able to go have INR drawn tomorrow morning. Pt advised to REPORT TO ER WITH ANY BLEEDING, FALLS, ACCIDENTS-CURRENTLY DENIES ANY. Also, pt advised NOT TO TAKE ANY WARFARIN UNTIL WE CHECK INR ON TOMORROW.

## 2023-04-19 NOTE — Telephone Encounter (Signed)
Spoke to Lab representative   Critical INR  from 04/18/23  -- 10.1    Sent message to Anticoag pool  to follow up

## 2023-04-20 ENCOUNTER — Telehealth: Payer: Self-pay

## 2023-04-20 ENCOUNTER — Telehealth: Payer: Self-pay | Admitting: *Deleted

## 2023-04-20 NOTE — Telephone Encounter (Signed)
Patient called and stated that he DID NOT go to the lab for his INR follow up. He states he went to him Pulmonary Doctor with [facility] today and was prescribed a zpak. Advised that we really need the INR as soon as possible since he had the vitamin k-reversal med and so we can know what dose of warfarin to have him resume. He states well he missed the cut off time of 2pm and just couldn't go any earlier and understood it's needed. He states he will go early in the morning and that since the pulmonary doctor doesn't know what's wrong with him that he might go the ER as well. Advised to please take care of himself and do what is needed. Also, explained without taking any warfarin and received the reversal medication that he could have a stroke or blood clot and he verbalized understanding. Advised we would follow up tomorrow.

## 2023-04-20 NOTE — Telephone Encounter (Signed)
Pt called and states he is feeling fatigued this morning and has not been able to go to the lab yet to have STAT PT/INR drawn. I made him aware of the importance of having this drawn since his INR was 10.0 2 days ago. Pt verbalized understanding and states he will try to go as soon as possible.

## 2023-04-21 ENCOUNTER — Ambulatory Visit (INDEPENDENT_AMBULATORY_CARE_PROVIDER_SITE_OTHER): Payer: Self-pay

## 2023-04-21 ENCOUNTER — Telehealth: Payer: Self-pay

## 2023-04-21 DIAGNOSIS — I480 Paroxysmal atrial fibrillation: Secondary | ICD-10-CM

## 2023-04-21 DIAGNOSIS — Z5181 Encounter for therapeutic drug level monitoring: Secondary | ICD-10-CM

## 2023-04-21 LAB — PROTIME-INR
INR: 2.2 — AB (ref 0.80–1.20)
INR: 2.2 — ABNORMAL HIGH (ref 0.9–1.2)
Prothrombin Time: 22.6 s — ABNORMAL HIGH (ref 9.1–12.0)

## 2023-04-21 NOTE — Progress Notes (Signed)
Anticoag clinic aware and handle

## 2023-04-21 NOTE — Telephone Encounter (Signed)
Called pt and confirmed he will go to the lab for STAT PT/INR around 11am today. Pt verbalized understanding of importance. Will await results.

## 2023-04-21 NOTE — Patient Instructions (Signed)
Description   Called and spoke with pt. Instructed to take 1 tablet today and then start taking 1/2 tablet daily. (Previous dose 1/2 tablet Sunday and Thursday) Recheck INR on Tuesday at [place] (pt out of strips)  Pt request call around 10am after home from [facility] [phone].

## 2023-04-25 ENCOUNTER — Telehealth: Payer: Self-pay

## 2023-04-25 NOTE — Telephone Encounter (Signed)
I spoke to patient and he is not able to go to lab today, will try tomorrow or Thursday, unless test strips arrive, he will do home test.

## 2023-04-26 ENCOUNTER — Ambulatory Visit (INDEPENDENT_AMBULATORY_CARE_PROVIDER_SITE_OTHER): Payer: Self-pay | Admitting: *Deleted

## 2023-04-26 DIAGNOSIS — Z5181 Encounter for therapeutic drug level monitoring: Secondary | ICD-10-CM | POA: Diagnosis not present

## 2023-04-26 DIAGNOSIS — I480 Paroxysmal atrial fibrillation: Secondary | ICD-10-CM | POA: Diagnosis not present

## 2023-04-26 DIAGNOSIS — D6869 Other thrombophilia: Secondary | ICD-10-CM | POA: Diagnosis not present

## 2023-04-26 DIAGNOSIS — Z952 Presence of prosthetic heart valve: Secondary | ICD-10-CM | POA: Diagnosis not present

## 2023-04-26 LAB — POCT INR: INR: 3 (ref 2.0–3.0)

## 2023-04-26 NOTE — Patient Instructions (Signed)
Description   Called and spoke with pt and instructed pt to continue taking 1/2 tablet daily. (Previous dose 1/2 tablet Sunday and Thursday) Recheck INR on Monday.  Pt request call around 10am after home from [facility] [phone].

## 2023-05-01 ENCOUNTER — Ambulatory Visit (INDEPENDENT_AMBULATORY_CARE_PROVIDER_SITE_OTHER): Payer: Self-pay | Admitting: *Deleted

## 2023-05-01 ENCOUNTER — Telehealth: Payer: Self-pay

## 2023-05-01 DIAGNOSIS — D6869 Other thrombophilia: Secondary | ICD-10-CM | POA: Diagnosis not present

## 2023-05-01 DIAGNOSIS — I480 Paroxysmal atrial fibrillation: Secondary | ICD-10-CM | POA: Diagnosis not present

## 2023-05-01 DIAGNOSIS — Z952 Presence of prosthetic heart valve: Secondary | ICD-10-CM | POA: Diagnosis not present

## 2023-05-01 LAB — POCT INR: INR: 1.9 — AB (ref 2.0–3.0)

## 2023-05-01 NOTE — Patient Instructions (Signed)
Description   Called and spoke with pt and instructed pt to take 1 tablet of warfarin then continue taking 1/2 tablet daily. (Previous dose 1/2 tablet Sunday and Thursday) Recheck INR on Monday.  Pt request call around 10am after home from [facility] [phone].

## 2023-05-01 NOTE — Telephone Encounter (Signed)
Lpm to check INR 

## 2023-05-08 ENCOUNTER — Ambulatory Visit (INDEPENDENT_AMBULATORY_CARE_PROVIDER_SITE_OTHER): Payer: Medicare HMO

## 2023-05-08 ENCOUNTER — Telehealth: Payer: Self-pay

## 2023-05-08 DIAGNOSIS — I480 Paroxysmal atrial fibrillation: Secondary | ICD-10-CM

## 2023-05-08 DIAGNOSIS — Z952 Presence of prosthetic heart valve: Secondary | ICD-10-CM | POA: Diagnosis not present

## 2023-05-08 DIAGNOSIS — D6869 Other thrombophilia: Secondary | ICD-10-CM | POA: Diagnosis not present

## 2023-05-08 LAB — POCT INR: INR: 1.6 — AB (ref 2.0–3.0)

## 2023-05-08 NOTE — Telephone Encounter (Signed)
Reminded patient to check INR ?

## 2023-05-08 NOTE — Patient Instructions (Signed)
Description   Spoke with pt and instructed pt to take 1 tablet of warfarin today and tomorrow, then start taking 1/2 tablet daily except 1 tablet on Mondays and Fridays.  Recheck INR in 1 week.  Pt request call around 10am after home from [facility] [phone].

## 2023-05-10 ENCOUNTER — Encounter: Payer: Self-pay | Admitting: Cardiology

## 2023-05-12 ENCOUNTER — Encounter: Payer: Self-pay | Admitting: Cardiology

## 2023-05-14 NOTE — Telephone Encounter (Signed)
 I totally understand -- often difficult for me to keep track with all of the admissions in [facility] -- I don't have access to lots of data = just notes.   I am sorry that it turned out this way, but I do agree that keeping all of the care in the same system makes sense.  Alm Clay, MD

## 2023-05-15 ENCOUNTER — Telehealth: Payer: Self-pay | Admitting: *Deleted

## 2023-05-15 LAB — POCT INR: INR: 2.8 (ref 2.0–3.0)

## 2023-05-15 NOTE — Telephone Encounter (Signed)
 Called pt since INR is due; he states he will perform later. Will await and follow up.

## 2023-05-16 ENCOUNTER — Ambulatory Visit (INDEPENDENT_AMBULATORY_CARE_PROVIDER_SITE_OTHER): Payer: Medicare HMO | Admitting: Internal Medicine

## 2023-05-16 DIAGNOSIS — Z5181 Encounter for therapeutic drug level monitoring: Secondary | ICD-10-CM | POA: Diagnosis not present

## 2023-05-16 DIAGNOSIS — Z952 Presence of prosthetic heart valve: Secondary | ICD-10-CM

## 2023-05-23 ENCOUNTER — Ambulatory Visit (INDEPENDENT_AMBULATORY_CARE_PROVIDER_SITE_OTHER): Payer: Self-pay | Admitting: *Deleted

## 2023-05-23 ENCOUNTER — Telehealth: Payer: Self-pay | Admitting: *Deleted

## 2023-05-23 DIAGNOSIS — I480 Paroxysmal atrial fibrillation: Secondary | ICD-10-CM

## 2023-05-23 DIAGNOSIS — D6869 Other thrombophilia: Secondary | ICD-10-CM

## 2023-05-23 DIAGNOSIS — Z952 Presence of prosthetic heart valve: Secondary | ICD-10-CM

## 2023-05-23 LAB — PROTIME-INR: INR: 2.8 — AB (ref 0.80–1.20)

## 2023-05-23 NOTE — Telephone Encounter (Signed)
 Spoke with pt and he states he used his last strip for POC and obtained an error so he can't perform his test.   Advised that will place a lab order and he states he can go around noon since he has to go there anyway today.   INR ordered STAT and will await follow results and follow up

## 2023-05-23 NOTE — Patient Instructions (Signed)
 Description   Spoke with pt and instructed pt to continue taking 1/2 tablet daily except 1 tablet on Mondays and Fridays.  Recheck INR in 2 weeks at [facility] (awaiting strips)-order placed for 06/06/23 and released.  Pt request call around 10am after home from radiation Coumadin  Clinic [phone].

## 2023-06-05 LAB — POCT INR: INR: 1.3 — AB (ref 2.0–3.0)

## 2023-06-06 ENCOUNTER — Ambulatory Visit (INDEPENDENT_AMBULATORY_CARE_PROVIDER_SITE_OTHER): Payer: Self-pay | Admitting: Cardiology

## 2023-06-06 DIAGNOSIS — Z952 Presence of prosthetic heart valve: Secondary | ICD-10-CM | POA: Diagnosis not present

## 2023-06-06 DIAGNOSIS — Z5181 Encounter for therapeutic drug level monitoring: Secondary | ICD-10-CM | POA: Diagnosis not present

## 2023-06-06 DIAGNOSIS — I480 Paroxysmal atrial fibrillation: Secondary | ICD-10-CM

## 2023-06-06 NOTE — Patient Instructions (Addendum)
Description   Spoke with pt and instructed pt to take 1 tablet today since he took 2 tablets on yesterday after checking INR then continue taking 1/2 tablet daily except 1 tablet on Mondays and Fridays.  Recheck INR in 1 week-Self tester.  Pt request call around 10am after home from [facility] [phone].

## 2023-06-12 ENCOUNTER — Ambulatory Visit (INDEPENDENT_AMBULATORY_CARE_PROVIDER_SITE_OTHER): Payer: Self-pay | Admitting: *Deleted

## 2023-06-12 DIAGNOSIS — I480 Paroxysmal atrial fibrillation: Secondary | ICD-10-CM | POA: Diagnosis not present

## 2023-06-12 DIAGNOSIS — D6869 Other thrombophilia: Secondary | ICD-10-CM | POA: Diagnosis not present

## 2023-06-12 DIAGNOSIS — Z952 Presence of prosthetic heart valve: Secondary | ICD-10-CM

## 2023-06-12 LAB — POCT INR: INR: 1.5 — AB (ref 2.0–3.0)

## 2023-06-12 NOTE — Patient Instructions (Signed)
Description   Spoke with pt and instructed pt to take 2 tablets today and 1 tablet of warfarin tomorrow then START taking 1/2 tablet daily except 1 tablet on Mondays, Wednesdays, and Fridays.  Recheck INR in 1 week-Self tester.  Pt request call around 10am after home from [facility] [phone].

## 2023-06-19 ENCOUNTER — Ambulatory Visit (INDEPENDENT_AMBULATORY_CARE_PROVIDER_SITE_OTHER): Payer: Medicare HMO

## 2023-06-19 DIAGNOSIS — Z5181 Encounter for therapeutic drug level monitoring: Secondary | ICD-10-CM | POA: Diagnosis not present

## 2023-06-19 LAB — POCT INR: INR: 1.8 — AB (ref 2.0–3.0)

## 2023-06-19 NOTE — Patient Instructions (Signed)
 Description   Spoke with pt and instructed pt to take 2 tablets today and then START taking 1/2 tablet daily except 1 tablet on Sunday, Mondays, Wednesdays, and Fridays.  Recheck INR on Friday - Self tester.  Pt request call around 10am after home from [facility]  Clinic [phone].

## 2023-06-21 ENCOUNTER — Ambulatory Visit: Payer: Medicare HMO | Admitting: Cardiology

## 2023-06-23 ENCOUNTER — Telehealth: Payer: Self-pay | Admitting: *Deleted

## 2023-06-23 ENCOUNTER — Ambulatory Visit (INDEPENDENT_AMBULATORY_CARE_PROVIDER_SITE_OTHER): Payer: Medicare HMO | Admitting: Internal Medicine

## 2023-06-23 DIAGNOSIS — Z952 Presence of prosthetic heart valve: Secondary | ICD-10-CM | POA: Diagnosis not present

## 2023-06-23 DIAGNOSIS — Z5181 Encounter for therapeutic drug level monitoring: Secondary | ICD-10-CM

## 2023-06-23 LAB — POCT INR: INR: 2 (ref 2.0–3.0)

## 2023-06-23 NOTE — Telephone Encounter (Signed)
Called pt since INR is due. Pt states he forgot and will do it soon.  Pt is currently on: Prednisone 4 tablets for 3 days, Take 3 tablets for 3 days, Take 2 tablets for 3 days, Take 1 tablet for 3 days. Should be starting 10mg -1 tablet for 3 days

## 2023-07-03 ENCOUNTER — Telehealth: Payer: Self-pay

## 2023-07-03 ENCOUNTER — Ambulatory Visit (INDEPENDENT_AMBULATORY_CARE_PROVIDER_SITE_OTHER): Payer: Self-pay

## 2023-07-03 DIAGNOSIS — Z5181 Encounter for therapeutic drug level monitoring: Secondary | ICD-10-CM | POA: Diagnosis not present

## 2023-07-03 LAB — POCT INR: INR: 1.3 — AB (ref 2.0–3.0)

## 2023-07-03 NOTE — Patient Instructions (Signed)
 Description   Spoke with pt and instructed to take 2 tablets today and then START taking 1 tablet daily except 1/2 tablet on Tuesdays and Thursdays.  Recheck INR in 1 week - Self tester.  Pt request call around 10am after home from [facility] [phone].

## 2023-07-03 NOTE — Telephone Encounter (Signed)
 I spoke to patient who is having Fluid drained from lungs on 2/28 and they want him to Hold Coumadin 3 days prior.  Apparently, they reached out to Dr Herbie Baltimore and were approved for 3 day Hold.  Patient will resume Coumadin when instructed and check INR 3/10.

## 2023-07-17 ENCOUNTER — Ambulatory Visit (INDEPENDENT_AMBULATORY_CARE_PROVIDER_SITE_OTHER): Payer: Self-pay

## 2023-07-17 DIAGNOSIS — I480 Paroxysmal atrial fibrillation: Secondary | ICD-10-CM

## 2023-07-17 DIAGNOSIS — Z952 Presence of prosthetic heart valve: Secondary | ICD-10-CM

## 2023-07-17 DIAGNOSIS — D6869 Other thrombophilia: Secondary | ICD-10-CM | POA: Diagnosis not present

## 2023-07-17 LAB — POCT INR: INR: 2 (ref 2.0–3.0)

## 2023-07-17 NOTE — Patient Instructions (Signed)
 Description   Spoke with pt and instructed to take 1.5 tablets today and then START taking 1 tablet daily except 1/2 tablet on Thursdays.  Recheck INR in 1 week - Self tester.  Pt request call around 10am after home from [facility] [phone].

## 2023-07-24 ENCOUNTER — Ambulatory Visit (INDEPENDENT_AMBULATORY_CARE_PROVIDER_SITE_OTHER): Payer: Self-pay | Admitting: Cardiology

## 2023-07-24 DIAGNOSIS — Z5181 Encounter for therapeutic drug level monitoring: Secondary | ICD-10-CM

## 2023-07-24 DIAGNOSIS — Z952 Presence of prosthetic heart valve: Secondary | ICD-10-CM

## 2023-07-24 LAB — POCT INR: INR: 3 (ref 2.0–3.0)

## 2023-08-07 ENCOUNTER — Ambulatory Visit (INDEPENDENT_AMBULATORY_CARE_PROVIDER_SITE_OTHER): Payer: Self-pay | Admitting: *Deleted

## 2023-08-07 DIAGNOSIS — I480 Paroxysmal atrial fibrillation: Secondary | ICD-10-CM | POA: Diagnosis not present

## 2023-08-07 DIAGNOSIS — Z952 Presence of prosthetic heart valve: Secondary | ICD-10-CM | POA: Diagnosis not present

## 2023-08-07 DIAGNOSIS — D6869 Other thrombophilia: Secondary | ICD-10-CM

## 2023-08-07 LAB — POCT INR: INR: 3.8 — AB (ref 2.0–3.0)

## 2023-08-07 NOTE — Patient Instructions (Signed)
 Description   Spoke with pt and instructed to not take any warfarin today (finishing prednisone taper) then continue taking 1 tablet daily except 1/2 tablet on Thursdays.  Recheck INR in 2 weeks - Self tester.  Pt request call around 10am after home from [facility] [phone].

## 2023-08-21 ENCOUNTER — Ambulatory Visit (INDEPENDENT_AMBULATORY_CARE_PROVIDER_SITE_OTHER): Payer: Self-pay | Admitting: *Deleted

## 2023-08-21 DIAGNOSIS — D6869 Other thrombophilia: Secondary | ICD-10-CM | POA: Diagnosis not present

## 2023-08-21 DIAGNOSIS — I480 Paroxysmal atrial fibrillation: Secondary | ICD-10-CM | POA: Diagnosis not present

## 2023-08-21 DIAGNOSIS — Z5181 Encounter for therapeutic drug level monitoring: Secondary | ICD-10-CM | POA: Diagnosis not present

## 2023-08-21 DIAGNOSIS — Z952 Presence of prosthetic heart valve: Secondary | ICD-10-CM | POA: Diagnosis not present

## 2023-08-21 LAB — POCT INR: INR: 2.5 (ref 2.0–3.0)

## 2023-08-21 NOTE — Patient Instructions (Signed)
 Description   Spoke with pt and instructed to continue taking 1 tablet daily except 1/2 tablet on Thursdays.  Recheck INR in 2 weeks-Self tester.  Pt request call around 10am after home from [facility] [phone].

## 2023-09-04 ENCOUNTER — Ambulatory Visit (INDEPENDENT_AMBULATORY_CARE_PROVIDER_SITE_OTHER): Payer: Self-pay | Admitting: Cardiovascular Disease

## 2023-09-04 DIAGNOSIS — Z5181 Encounter for therapeutic drug level monitoring: Secondary | ICD-10-CM | POA: Diagnosis not present

## 2023-09-04 DIAGNOSIS — Z952 Presence of prosthetic heart valve: Secondary | ICD-10-CM | POA: Diagnosis not present

## 2023-09-04 LAB — POCT INR: INR: 1.9 — AB (ref 2.0–3.0)

## 2023-09-10 ENCOUNTER — Encounter: Payer: Self-pay | Admitting: Cardiology

## 2023-09-18 ENCOUNTER — Ambulatory Visit (INDEPENDENT_AMBULATORY_CARE_PROVIDER_SITE_OTHER): Admitting: Cardiology

## 2023-09-18 DIAGNOSIS — Z952 Presence of prosthetic heart valve: Secondary | ICD-10-CM

## 2023-09-18 DIAGNOSIS — Z5181 Encounter for therapeutic drug level monitoring: Secondary | ICD-10-CM

## 2023-09-18 LAB — POCT INR: INR: 2.2 (ref 2.0–3.0)

## 2023-09-29 ENCOUNTER — Telehealth: Payer: Self-pay | Admitting: *Deleted

## 2023-09-29 NOTE — Telephone Encounter (Signed)
 Pt called to report that he is currently hospitalized at [facility] and is unsure if he will be out on Monday or Tuesday to report home INR results. Advised once discharged to call us  back to update us  and he verbalized understanding.

## 2023-10-05 ENCOUNTER — Telehealth: Payer: Self-pay

## 2023-10-05 NOTE — Telephone Encounter (Signed)
 INR overdue. Pt is currently hospitalized. I called and spoke with pt. Erik Meyer states he may be discharged over the weekend; however, he will be going to Rehab. I made him aware we will call Monday for an update.

## 2023-10-23 ENCOUNTER — Telehealth: Payer: Self-pay

## 2023-10-23 NOTE — Telephone Encounter (Signed)
 Lpmtcb to discuss possible hospital discharge.

## 2023-10-26 ENCOUNTER — Ambulatory Visit: Admitting: Cardiology

## 2023-11-07 DEATH — deceased

## 2023-11-23 ENCOUNTER — Telehealth: Payer: Self-pay

## 2023-11-23 NOTE — Telephone Encounter (Signed)
 Received call from Burnard Motto, pt's niece stating pt has deceased. I made her aware the coumadin  clinic were sending our condolences to her and her entire family.

## 2023-11-27 ENCOUNTER — Ambulatory Visit: Admitting: Cardiology
